# Patient Record
Sex: Male | Born: 1940 | Race: Black or African American | Hispanic: No | Smoking: Former smoker
Health system: Southern US, Community
[De-identification: ages and names within clinical notes are randomized; demographics above are authoritative.]

## PROBLEM LIST (undated history)

## (undated) DIAGNOSIS — C61 Malignant neoplasm of prostate: Secondary | ICD-10-CM

## (undated) DIAGNOSIS — Z8042 Family history of malignant neoplasm of prostate: Secondary | ICD-10-CM

## (undated) DIAGNOSIS — E119 Type 2 diabetes mellitus without complications: Secondary | ICD-10-CM

## (undated) DIAGNOSIS — Z8041 Family history of malignant neoplasm of ovary: Secondary | ICD-10-CM

## (undated) DIAGNOSIS — C801 Malignant (primary) neoplasm, unspecified: Secondary | ICD-10-CM

## (undated) DIAGNOSIS — J449 Chronic obstructive pulmonary disease, unspecified: Secondary | ICD-10-CM

## (undated) DIAGNOSIS — I10 Essential (primary) hypertension: Secondary | ICD-10-CM

## (undated) HISTORY — PX: GOLD SEED IMPLANT: SHX6343

## (undated) HISTORY — DX: Family history of malignant neoplasm of prostate: Z80.42

## (undated) HISTORY — DX: Chronic obstructive pulmonary disease, unspecified: J44.9

## (undated) HISTORY — DX: Family history of malignant neoplasm of ovary: Z80.41

---

## 2004-07-03 DIAGNOSIS — R079 Chest pain, unspecified: Secondary | ICD-10-CM | POA: Insufficient documentation

## 2007-05-16 DIAGNOSIS — Z8679 Personal history of other diseases of the circulatory system: Secondary | ICD-10-CM | POA: Insufficient documentation

## 2007-05-16 DIAGNOSIS — Z720 Tobacco use: Secondary | ICD-10-CM | POA: Insufficient documentation

## 2007-10-05 DIAGNOSIS — J42 Unspecified chronic bronchitis: Secondary | ICD-10-CM | POA: Insufficient documentation

## 2007-10-11 DIAGNOSIS — F528 Other sexual dysfunction not due to a substance or known physiological condition: Secondary | ICD-10-CM | POA: Insufficient documentation

## 2007-12-17 DIAGNOSIS — M25519 Pain in unspecified shoulder: Secondary | ICD-10-CM | POA: Insufficient documentation

## 2015-07-03 ENCOUNTER — Encounter (HOSPITAL_COMMUNITY): Payer: Self-pay

## 2015-07-03 ENCOUNTER — Emergency Department (HOSPITAL_COMMUNITY): Payer: Medicare PPO

## 2015-07-03 ENCOUNTER — Emergency Department (HOSPITAL_COMMUNITY)
Admission: EM | Admit: 2015-07-03 | Discharge: 2015-07-03 | Disposition: A | Discharge status: Home / Self Care | Payer: Medicare PPO | Attending: Emergency Medicine | Admitting: Emergency Medicine

## 2015-07-03 DIAGNOSIS — Z7952 Long term (current) use of systemic steroids: Secondary | ICD-10-CM | POA: Insufficient documentation

## 2015-07-03 DIAGNOSIS — Z7982 Long term (current) use of aspirin: Secondary | ICD-10-CM | POA: Insufficient documentation

## 2015-07-03 DIAGNOSIS — Z79899 Other long term (current) drug therapy: Secondary | ICD-10-CM | POA: Diagnosis not present

## 2015-07-03 DIAGNOSIS — I10 Essential (primary) hypertension: Secondary | ICD-10-CM | POA: Diagnosis not present

## 2015-07-03 DIAGNOSIS — F17210 Nicotine dependence, cigarettes, uncomplicated: Secondary | ICD-10-CM | POA: Insufficient documentation

## 2015-07-03 DIAGNOSIS — Z8546 Personal history of malignant neoplasm of prostate: Secondary | ICD-10-CM | POA: Insufficient documentation

## 2015-07-03 DIAGNOSIS — R590 Localized enlarged lymph nodes: Secondary | ICD-10-CM | POA: Insufficient documentation

## 2015-07-03 DIAGNOSIS — R7989 Other specified abnormal findings of blood chemistry: Secondary | ICD-10-CM | POA: Diagnosis present

## 2015-07-03 HISTORY — DX: Malignant (primary) neoplasm, unspecified: C80.1

## 2015-07-03 HISTORY — DX: Essential (primary) hypertension: I10

## 2015-07-03 LAB — CBC WITH DIFFERENTIAL/PLATELET
BASOS ABS: 0 10*3/uL (ref 0.0–0.1)
Basophils Relative: 1 %
EOS PCT: 1 %
Eosinophils Absolute: 0.1 10*3/uL (ref 0.0–0.7)
HCT: 40.1 % (ref 39.0–52.0)
Hemoglobin: 12.9 g/dL — ABNORMAL LOW (ref 13.0–17.0)
LYMPHS PCT: 15 %
Lymphs Abs: 1.3 10*3/uL (ref 0.7–4.0)
MCH: 28.4 pg (ref 26.0–34.0)
MCHC: 32.2 g/dL (ref 30.0–36.0)
MCV: 88.3 fL (ref 78.0–100.0)
MONO ABS: 1.1 10*3/uL — AB (ref 0.1–1.0)
Monocytes Relative: 13 %
Neutro Abs: 6.1 10*3/uL (ref 1.7–7.7)
Neutrophils Relative %: 70 %
PLATELETS: 319 10*3/uL (ref 150–400)
RBC: 4.54 MIL/uL (ref 4.22–5.81)
RDW: 13.8 % (ref 11.5–15.5)
WBC: 8.6 10*3/uL (ref 4.0–10.5)

## 2015-07-03 LAB — BASIC METABOLIC PANEL
ANION GAP: 6 (ref 5–15)
BUN: 14 mg/dL (ref 6–20)
CALCIUM: 9.9 mg/dL (ref 8.9–10.3)
CO2: 31 mmol/L (ref 22–32)
Chloride: 105 mmol/L (ref 101–111)
Creatinine, Ser: 0.94 mg/dL (ref 0.61–1.24)
GFR calc Af Amer: >60 mL/min (ref >60)
GLUCOSE: 74 mg/dL (ref 65–99)
Potassium: 3.9 mmol/L (ref 3.5–5.1)
Sodium: 142 mmol/L (ref 135–145)

## 2015-07-03 MED ORDER — IOHEXOL 350 MG/ML SOLN
100.0000 mL | Freq: Once | INTRAVENOUS | Status: AC | PRN
  Administered 2015-07-03: 100 mL via INTRAVENOUS

## 2015-07-03 NOTE — ED Notes (Signed)
MD at bedside. 

## 2015-07-03 NOTE — Progress Notes (Signed)
EDCM spoke to patient at bedside.  Patient confirms his pcp is Dr. Garnette Scheuermann in [place].

## 2015-07-03 NOTE — ED Notes (Signed)
Bed: WA23 Expected date:  Expected time:  Means of arrival:  Comments: Triage 5 

## 2015-07-03 NOTE — Discharge Instructions (Signed)

## 2015-07-03 NOTE — ED Provider Notes (Signed)
CSN: [id number]     Arrival date & time 07/03/15  1752 History   First MD Initiated Contact with Patient 07/03/15 1847     Chief Complaint  Patient presents with  . CA Pt   . Abnormal Lab  . Cough    HPI Pt has been having trouble with a cough for several weeks associated with sinus congestion.  No fevers. He denies any chest pain.  He has noticed some shortness of breath when he overdoes it but he is generally not feeling short of breath.  He went to an UC yesterday and was started on doxycycline.  He had some lab tests yesterday that included a d dimer and he was told to come to the ED because that was elevated. Past Medical History  Diagnosis Date  . Cancer [facility])     prostate  . Hypertension    Past Surgical History  Procedure Laterality Date  . Gold seed implant     History reviewed. No pertinent family history. Social History  Substance Use Topics  . Smoking status: Current Every Day Smoker    Types: Cigarettes  . Smokeless tobacco: None  . Alcohol Use: Yes    Review of Systems  All other systems reviewed and are negative.     Allergies  Review of patient's allergies indicates no known allergies.  Home Medications   Prior to Admission medications   Medication Sig Start Date End Date Taking? Authorizing Provider  acetaminophen (TYLENOL) 500 MG tablet Take 500 mg by mouth every 6 (six) hours as needed for moderate pain.   Yes Historical Provider, MD  amLODipine (NORVASC) 10 MG tablet Take 10 mg by mouth daily. 06/07/15  Yes Historical Provider, MD  aspirin EC 81 MG tablet Take 81 mg by mouth daily.   Yes Historical Provider, MD  brimonidine (ALPHAGAN) 0.2 % ophthalmic solution Place 1 drop into both eyes 3 (three) times daily. 05/03/15  Yes Historical Provider, MD  CALCIUM PO Take 1 tablet by mouth daily.   Yes Historical Provider, MD  Cholecalciferol (VITAMIN D-3 PO) Take 1 tablet by mouth daily.   Yes Historical Provider, MD  dorzolamide (TRUSOPT) 2 % ophthalmic  solution Place 1 drop into both eyes 3 (three) times daily. 06/04/15  Yes Historical Provider, MD  doxycycline (VIBRAMYCIN) 100 MG capsule Take 1 capsule by mouth 2 (two) times daily. Started 07/02/2015 06/04/15  Yes Historical Provider, MD  ibuprofen (ADVIL,MOTRIN) 200 MG tablet Take 200 mg by mouth every 6 (six) hours as needed for moderate pain.   Yes Historical Provider, MD  latanoprost (XALATAN) 0.005 % ophthalmic solution Place 1 drop into both eyes at bedtime. 05/16/15  Yes Historical Provider, MD  losartan (COZAAR) 100 MG tablet Take 100 mg by mouth daily. 06/07/15  Yes Historical Provider, MD  prednisoLONE acetate (PRED FORTE) 1 % ophthalmic suspension Place 1 drop into both eyes daily.   Yes Historical Provider, MD  SPIRIVA RESPIMAT 2.5 MCG/ACT AERS Inhale 1 puff into the lungs daily. 04/30/15  Yes Historical Provider, MD   BP 146/78 mmHg  Pulse 53  Temp(Src) 98.6 F (37 C) (Oral)  Resp 14  SpO2 100% Physical Exam  Constitutional: No distress.  HENT:  Head: Normocephalic and atraumatic.  Right Ear: External ear normal.  Left Ear: External ear normal.  Eyes: Conjunctivae are normal. Right eye exhibits no discharge. Left eye exhibits no discharge. No scleral icterus.  Neck: Neck supple. No tracheal deviation present.  Cardiovascular: Normal rate, regular rhythm and  intact distal pulses.   Pulmonary/Chest: Effort normal and breath sounds normal. No stridor. No respiratory distress. He has no wheezes. He has no rales.  Abdominal: Soft. Bowel sounds are normal. He exhibits no distension. There is no tenderness. There is no rebound and no guarding.  Musculoskeletal: He exhibits no edema or tenderness.  Lymphadenopathy:       Left: Supraclavicular adenopathy present.  Neurological: He is alert. He has normal strength. No cranial nerve deficit (no facial droop, extraocular movements intact, no slurred speech) or sensory deficit. He exhibits normal muscle tone. He displays no seizure activity.  Coordination normal.  Skin: Skin is warm and dry. No rash noted.  Psychiatric: He has a normal mood and affect.  Nursing note and vitals reviewed.   ED Course  Procedures (including critical care time) Labs Review Labs Reviewed  CBC WITH DIFFERENTIAL/PLATELET - Abnormal; Notable for the following:    Hemoglobin 12.9 (*)    Monocytes Absolute 1.1 (*)    All other components within normal limits  BASIC METABOLIC PANEL    Imaging Review Ct Angio Chest Pe W/cm &/or Wo Cm  07/03/2015  CLINICAL DATA:  Cough for 1 month.  Elevated D-dimer. EXAM: CT ANGIOGRAPHY CHEST WITH CONTRAST TECHNIQUE: Multidetector CT imaging of the chest was performed using the standard protocol during bolus administration of intravenous contrast. Multiplanar CT image reconstructions and MIPs were obtained to evaluate the vascular anatomy. CONTRAST:  199mL OMNIPAQUE IOHEXOL 350 MG/ML SOLN COMPARISON:  None. FINDINGS: Mediastinum/Lymph Nodes: No pulmonary emboli or thoracic aortic dissection identified. Pathologic lymph node enlargement in the anterior mediastinum, also the LEFT supraclavicular region. Conglomerate nodal mass on image 34 series 4 overlying the transverse arch measures 23 x 40 mm cross-section. No hilar lymph nodes are seen. Differential includes lymphoma versus metastatic disease. Incidental fluid in the pericardial recess on image 50 series 4. Lungs/Pleura: No pulmonary mass, infiltrate, or effusion. Upper abdomen: No acute findings. No visible retrocrural or upper abdominal lymph nodes. Musculoskeletal: No chest wall mass or suspicious bone lesions identified. Review of the MIP images confirms the above findings. IMPRESSION: Anterior mediastinal and LEFT supraclavicular pathologic adenopathy. Differential includes lymphoma versus metastatic disease. No lung mass is seen. No evidence for pulmonary emboli. Electronically Signed   By: Staci Righter M.D.   On: 07/03/2015 21:28   I have personally reviewed and  evaluated these images and lab results as part of my medical decision-making.   MDM   Final diagnoses:  Mediastinal adenopathy  Supraclavicular adenopathy   The patient's laboratory tests are unremarkable. His CT scan of the chest does not show any evidence of pulmonary embolism however he does have abnormal lymphadenopathy in the anterior mediastinum in the left supraclavicular region. I can palpate the left supraclavicular lymph nodes. Patient does have a history of prostate cancer. I explained the findings to the patient and his daughter. They understand that this could be related to either lymph node cancer or possibly a metastatic cancer.He is scheduled to see his oncologist on Monday to review the results of a recent bone scan. They will discuss the results of this test with him on Monday.     Dorie Rank, MD 07/03/15 2223

## 2015-07-03 NOTE — ED Notes (Signed)
Pt c/o cough x 1 month and elevated D-dimer at Urgent Care yesterday.  Denies pain.  Pt reports "cough due to sinuses."  Pt was seen at Urgent Care yesterday for same and started on doxycycline.  Also, Urgent care ran a D-Dimer and called w/ results today.

## 2016-03-19 ENCOUNTER — Encounter: Payer: Self-pay | Admitting: Hematology

## 2016-04-01 ENCOUNTER — Encounter (INDEPENDENT_AMBULATORY_CARE_PROVIDER_SITE_OTHER): Payer: Medicare PPO | Admitting: Ophthalmology

## 2016-04-01 DIAGNOSIS — H401132 Primary open-angle glaucoma, bilateral, moderate stage: Secondary | ICD-10-CM | POA: Diagnosis not present

## 2016-04-01 DIAGNOSIS — H26491 Other secondary cataract, right eye: Secondary | ICD-10-CM | POA: Diagnosis not present

## 2016-04-01 DIAGNOSIS — H43813 Vitreous degeneration, bilateral: Secondary | ICD-10-CM

## 2016-04-01 DIAGNOSIS — I10 Essential (primary) hypertension: Secondary | ICD-10-CM | POA: Diagnosis not present

## 2016-04-01 DIAGNOSIS — H35033 Hypertensive retinopathy, bilateral: Secondary | ICD-10-CM | POA: Diagnosis not present

## 2016-04-20 DIAGNOSIS — I10 Essential (primary) hypertension: Secondary | ICD-10-CM | POA: Insufficient documentation

## 2016-05-21 ENCOUNTER — Encounter: Payer: Self-pay | Admitting: Hematology

## 2017-08-27 ENCOUNTER — Encounter (HOSPITAL_COMMUNITY): Payer: Self-pay | Admitting: Hematology

## 2017-08-27 ENCOUNTER — Inpatient Hospital Stay (HOSPITAL_COMMUNITY): Payer: Medicare PPO | Attending: Hematology | Admitting: Hematology

## 2017-08-27 ENCOUNTER — Ambulatory Visit (HOSPITAL_COMMUNITY): Payer: Medicare PPO | Admitting: Hematology

## 2017-08-27 ENCOUNTER — Other Ambulatory Visit: Payer: Self-pay

## 2017-08-27 ENCOUNTER — Inpatient Hospital Stay (HOSPITAL_COMMUNITY): Payer: Medicare PPO

## 2017-08-27 DIAGNOSIS — E8770 Fluid overload, unspecified: Secondary | ICD-10-CM

## 2017-08-27 DIAGNOSIS — C61 Malignant neoplasm of prostate: Secondary | ICD-10-CM | POA: Diagnosis not present

## 2017-08-27 DIAGNOSIS — Z720 Tobacco use: Secondary | ICD-10-CM

## 2017-08-27 DIAGNOSIS — Z809 Family history of malignant neoplasm, unspecified: Secondary | ICD-10-CM | POA: Diagnosis not present

## 2017-08-27 DIAGNOSIS — C771 Secondary and unspecified malignant neoplasm of intrathoracic lymph nodes: Principal | ICD-10-CM

## 2017-08-27 DIAGNOSIS — E291 Testicular hypofunction: Secondary | ICD-10-CM

## 2017-08-27 DIAGNOSIS — G620 Drug-induced polyneuropathy: Secondary | ICD-10-CM | POA: Diagnosis not present

## 2017-08-27 LAB — COMPREHENSIVE METABOLIC PANEL
ALBUMIN: 3.7 g/dL (ref 3.5–5.0)
ALT: 10 U/L — ABNORMAL LOW (ref 17–63)
AST: 17 U/L (ref 15–41)
Alkaline Phosphatase: 48 U/L (ref 38–126)
Anion gap: 9 (ref 5–15)
BUN: 16 mg/dL (ref 6–20)
CHLORIDE: 105 mmol/L (ref 101–111)
CO2: 25 mmol/L (ref 22–32)
Calcium: 9.7 mg/dL (ref 8.9–10.3)
Creatinine, Ser: 0.95 mg/dL (ref 0.61–1.24)
GFR calc Af Amer: >60 mL/min (ref >60)
GFR calc non Af Amer: >60 mL/min (ref >60)
Glucose, Bld: 147 mg/dL — ABNORMAL HIGH (ref 65–99)
POTASSIUM: 4 mmol/L (ref 3.5–5.1)
SODIUM: 139 mmol/L (ref 135–145)
Total Bilirubin: 0.8 mg/dL (ref 0.3–1.2)
Total Protein: 6.8 g/dL (ref 6.5–8.1)

## 2017-08-27 LAB — CBC
HEMATOCRIT: 38.3 % — AB (ref 39.0–52.0)
Hemoglobin: 12.1 g/dL — ABNORMAL LOW (ref 13.0–17.0)
MCH: 27.4 pg (ref 26.0–34.0)
MCHC: 31.6 g/dL (ref 30.0–36.0)
MCV: 86.8 fL (ref 78.0–100.0)
Platelets: 305 10*3/uL (ref 150–400)
RBC: 4.41 MIL/uL (ref 4.22–5.81)
RDW: 13.4 % (ref 11.5–15.5)
WBC: 9.4 10*3/uL (ref 4.0–10.5)

## 2017-08-27 LAB — PSA: Prostatic Specific Antigen: 0.38 ng/mL (ref 0.00–4.00)

## 2017-08-27 MED ORDER — HEPARIN SOD (PORK) LOCK FLUSH 100 UNIT/ML IV SOLN
500.0000 [IU] | Freq: Once | INTRAVENOUS | Status: AC
  Administered 2017-08-27: 500 [IU] via INTRAVENOUS

## 2017-08-27 MED ORDER — SODIUM CHLORIDE 0.9% FLUSH
10.0000 mL | Freq: Once | INTRAVENOUS | Status: AC
  Administered 2017-08-27: 10 mL via INTRAVENOUS

## 2017-08-27 NOTE — Assessment & Plan Note (Signed)
1.  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  Last PSA on 07/02/2017 was 0.4, went up from 0.2 prior to that.  We will obtain another PSA level today.  We will continue abiraterone and prednisone at this time.  It is possible that he could be developing resistance to the sciatica.  We will also consider checking BRCA1/2 status to see if he will be a candidate for PARP inhibitor therapy.  His next Lupron injection is on May 31 at Dr. Wilhemina Bonito office.  He will come back in 2 months for follow-up.  2.  Androgen deprivation therapy induced bone loss: We have started him on zoledronic acid once every 3 months for a year.  His third dose was on 07/02/2017.  He will receive his fourth dose at next visit in 2 months.  He will continue calcium supplements.  3.  Fluid overload: He is continuing Bumex 1 mg daily as needed.  4.  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  Fingertips are numb at times.  His feet feel cold all the time.  He is able to perform all his activities.

## 2017-08-27 NOTE — Patient Instructions (Signed)
[place] at [facility]  Discharge Instructions:  Your port was flushed with labs drawn today.   _______________________________________________________________  Thank you for choosing [place] at [facility] to provide your oncology and hematology care.  To afford each patient quality time with our providers, please arrive at least 15 minutes before your scheduled appointment.  You need to re-schedule your appointment if you arrive 10 or more minutes late.  We strive to give you quality time with our providers, and arriving late affects you and other patients whose appointments are after yours.  Also, if you no show three or more times for appointments you may be dismissed from the clinic.  Again, thank you for choosing [place] [facility] [place] hope is that these requests will allow you access to exceptional care and in a timely manner. _______________________________________________________________  If you have questions after your visit, please contact our office at [phone] between the hours of 8:30 a.m. and 5:00 p.m. Voicemails left after 4:30 p.m. will not be returned until the following business day. _______________________________________________________________  For prescription refill requests, have your pharmacy contact our office. _______________________________________________________________  Recommendations made by the consultant and any test results will be sent to your referring physician. _______________________________________________________________

## 2017-08-27 NOTE — Progress Notes (Signed)
[facility] NOTE   Patient Care Team: Joseph Art, MD as PCP - General (Internal Medicine)  CHIEF COMPLAINTS/PURPOSE OF CONSULTATION:  Metastatic prostate cancer.  HISTORY OF PRESENTING ILLNESS:  Andre [name] 77 y.o. male is seen today for further management of metastatic prostate cancer to the lymph nodes.  He was initially diagnosed in March 2017, received chemotherapy with docetaxel for 14 cycles.  It was changed to Abiraterone secondary to lower extremity swelling.  He is tolerating Abiraterone and prednisone very well.  He is continuing to use bumetanide 1 mg as needed for his lower extremity swelling.  He is able to perform all his activities at home without any problems.  Today he is seen with his daughter.  He denies any new onset pains.  No fevers, night sweats or weight loss was reported.  He was reportedly started on Lipitor by Dr. Joaquim Lai.  He denies any headaches or vision changes.  His blood pressure at home is well controlled.  MEDICAL HISTORY:  Past Medical History:  Diagnosis Date  . Cancer [facility])    prostate  . COPD (chronic obstructive pulmonary disease) ([place])   . Hypertension     SURGICAL HISTORY: Past Surgical History:  Procedure Laterality Date  . GOLD SEED IMPLANT      SOCIAL HISTORY: Social History   Socioeconomic History  . Marital status: Widowed    Spouse name: Not on file  . Number of children: Not on file  . Years of education: Not on file  . Highest education level: Not on file  Occupational History  . Not on file  Social Needs  . Financial resource strain: Not on file  . Food insecurity:    Worry: Not on file    Inability: Not on file  . Transportation needs:    Medical: Not on file    Non-medical: Not on file  Tobacco Use  . Smoking status: Current Every Day Smoker    Types: Cigarettes  . Smokeless tobacco: Never Used  Substance and Sexual Activity  . Alcohol use: Not Currently  . Drug use: No  . Sexual  activity: Not on file  Lifestyle  . Physical activity:    Days per week: Not on file    Minutes per session: Not on file  . Stress: Not on file  Relationships  . Social connections:    Talks on phone: Not on file    Gets together: Not on file    Attends religious service: Not on file    Active member of club or organization: Not on file    Attends meetings of clubs or organizations: Not on file    Relationship status: Not on file  . Intimate partner violence:    Fear of current or ex partner: Not on file    Emotionally abused: Not on file    Physically abused: Not on file    Forced sexual activity: Not on file  Other Topics Concern  . Not on file  Social History Narrative  . Not on file    FAMILY HISTORY: Family History  Problem Relation Age of Onset  . Cancer Mother   . Heart attack Father     ALLERGIES:  has No Known Allergies.  MEDICATIONS:  Current Outpatient Medications  Medication Sig Dispense Refill  . abiraterone acetate (ZYTIGA) 500 MG tablet Take 1,000 mg by mouth daily. Take on an empty stomach 1 hour before or 2 hours after a meal.    .  acetaminophen (TYLENOL) 500 MG tablet Take 500 mg by mouth every 6 (six) hours as needed for moderate pain.    Marland Kitchen aspirin EC 81 MG tablet Take 81 mg by mouth daily.    Marland Kitchen atorvastatin (LIPITOR) 10 MG tablet Take 10 mg by mouth at bedtime.    . brimonidine (ALPHAGAN) 0.2 % ophthalmic solution Place 1 drop into both eyes 3 (three) times daily.    . bumetanide (BUMEX) 1 MG tablet Take 1 mg by mouth daily.    Marland Kitchen CALCIUM PO Take 600 mg by mouth daily.     . cetirizine (ZYRTEC) 10 MG tablet Take 10 mg by mouth daily.    . Cholecalciferol (VITAMIN D-3 PO) Take 1 tablet by mouth daily.    Marland Kitchen dicyclomine (BENTYL) 10 MG capsule Take 10 mg by mouth daily.    . dorzolamide (TRUSOPT) 2 % ophthalmic solution Place 1 drop into both eyes 3 (three) times daily.    Marland Kitchen ibuprofen (ADVIL,MOTRIN) 200 MG tablet Take 200 mg by mouth every 6 (six) hours  as needed for moderate pain.    Marland Kitchen latanoprost (XALATAN) 0.005 % ophthalmic solution Place 1 drop into both eyes at bedtime.    Marland Kitchen losartan (COZAAR) 100 MG tablet Take 100 mg by mouth daily.    . magnesium 30 MG tablet Take 400 mg by mouth daily.    . potassium chloride SA (K-DUR,KLOR-CON) 20 MEQ tablet Take 40 mEq by mouth daily.    . predniSONE (DELTASONE) 5 MG tablet Take 5 mg by mouth daily with breakfast.    . prochlorperazine (COMPAZINE) 10 MG tablet Take 10 mg by mouth every 6 (six) hours as needed for nausea or vomiting.    . umeclidinium-vilanterol (ANORO ELLIPTA) 62.5-25 MCG/INH AEPB Inhale 1 puff into the lungs daily.     No current facility-administered medications for this visit.     REVIEW OF SYSTEMS:   Constitutional: Denies fevers, chills or abnormal night sweats Eyes: Denies blurriness of vision, double vision or watery eyes Ears, nose, mouth, throat, and face: Denies mucositis or sore throat Respiratory: Denies cough, dyspnea or wheezes Cardiovascular: Denies palpitation, chest discomfort or lower extremity swelling Gastrointestinal:  Denies nausea, heartburn or change in bowel habits Skin: Denies abnormal skin rashes Lymphatics: Denies new lymphadenopathy or easy bruising Neurological: He has some numbness in the fingertips.  His feet feel cold. Behavioral/Psych: Mood is stable, no new changes  All other systems were reviewed with the patient and are negative.  PHYSICAL EXAMINATION: ECOG PERFORMANCE STATUS: 1 - Symptomatic but completely ambulatory I have reviewed his vital signs today. GENERAL:alert, no distress and comfortable SKIN: skin color, texture, turgor are normal, no rashes or significant lesions EYES: normal, conjunctiva are pink and non-injected, sclera clear OROPHARYNX:no exudate, no erythema and lips, buccal mucosa, and tongue normal  NECK: supple, thyroid normal size, non-tender, without nodularity LYMPH:  no palpable lymphadenopathy in the cervical,  axillary or inguinal LUNGS: clear to auscultation and percussion with normal breathing effort HEART: regular rate & rhythm and no murmurs and no lower extremity edema ABDOMEN:abdomen soft, non-tender and normal bowel sounds Musculoskeletal:no cyanosis of digits and no clubbing  PSYCH: alert & oriented x 3 with fluent speech NEURO: no focal motor/sensory deficits  LABORATORY DATA:  I have reviewed the data as listed Lab Results  Component Value Date   WBC 8.6 07/03/2015   HGB 12.9 (L) 07/03/2015   HCT 40.1 07/03/2015   MCV 88.3 07/03/2015   PLT 319 07/03/2015  Chemistry      Component Value Date/Time   NA 142 07/03/2015 1953   K 3.9 07/03/2015 1953   CL 105 07/03/2015 1953   CO2 31 07/03/2015 1953   BUN 14 07/03/2015 1953   CREATININE 0.94 07/03/2015 1953      Component Value Date/Time   CALCIUM 9.9 07/03/2015 1953       RADIOGRAPHIC STUDIES: I have reviewed his previous CT scans from December 2017 which did not show any evidence of metastatic disease.  ASSESSMENT & PLAN:  Prostate cancer metastatic to intrathoracic lymph node ([place]) 1.  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  Last PSA on 07/02/2017 was 0.4, went up from 0.2 prior to that.  We will obtain another PSA level today.  We will continue abiraterone and prednisone at this time.  It is possible that he could be developing resistance to the sciatica.  We will also consider checking BRCA1/2 status to see if he will be a candidate for PARP inhibitor therapy.  His next Lupron injection is on May 31 at Dr. Wilhemina Bonito office.  He will come back in 2 months for follow-up.  2.  Androgen deprivation therapy induced bone loss: We have started him on zoledronic acid once every 3 months for a year.  His third dose was on 07/02/2017.  He will receive his fourth dose at next visit in 2 months.  He will continue calcium supplements.  3.   Fluid overload: He is continuing Bumex 1 mg daily as needed.  4.  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  Fingertips are numb at times.  His feet feel cold all the time.  He is able to perform all his activities.  Orders Placed This Encounter  Procedures  . CBC  . Comprehensive metabolic panel  . PSA    Standing Status:   Future    Standing Expiration Date:   08/27/2018  . CBC    Standing Status:   Future    Number of Occurrences:   1    Standing Expiration Date:   08/27/2018  . Comprehensive metabolic panel    Standing Status:   Future    Number of Occurrences:   1    Standing Expiration Date:   08/27/2018  . PSA    Standing Status:   Future    Number of Occurrences:   1    Standing Expiration Date:   08/27/2018        Derek Jack, MD 08/27/2017 3:23 PM

## 2017-08-27 NOTE — Progress Notes (Signed)
Port flushed with lab draw per orders.  Port site clean and dry with no bruising or swelling noted at site.  Good blood return with no complaints of pain with flush.  Band aid applied.  VSS with discharge and left ambulatory with family.  No s/s of distress noted.

## 2017-08-31 ENCOUNTER — Encounter (HOSPITAL_COMMUNITY): Payer: Self-pay

## 2017-09-29 ENCOUNTER — Telehealth (HOSPITAL_COMMUNITY): Payer: Self-pay

## 2017-09-29 DIAGNOSIS — C61 Malignant neoplasm of prostate: Secondary | ICD-10-CM

## 2017-09-29 DIAGNOSIS — C771 Secondary and unspecified malignant neoplasm of intrathoracic lymph nodes: Principal | ICD-10-CM

## 2017-09-29 MED ORDER — BUMETANIDE 1 MG PO TABS: 1.0000 mg | ORAL_TABLET | Freq: Every day | ORAL | 0 refills | Status: DC

## 2017-09-29 NOTE — Telephone Encounter (Signed)
-----   Message from Holley Bouche, NP sent at 09/29/2017  4:16 PM EDT ----- Regarding: RE: refill We don't provide this patient with Bumex; he should contact his PCP or whoever initially wrote the prescription for him.   gwd ----- Message ----- From: Louis Meckel Sent: 09/29/2017   4:08 PM To: Holley Bouche, NP, Onc Nurse Ap Subject: refill                                         Patient needs refill on Bumex. Patient uses [facility].

## 2017-09-29 NOTE — Telephone Encounter (Signed)
Reviewed with Dr. Delton Coombes due to him being the provider who originally wrote the prescription per the daughter.  Ok to refill verbal order Dr. Delton Coombes.

## 2017-10-13 ENCOUNTER — Other Ambulatory Visit (HOSPITAL_COMMUNITY): Payer: Self-pay | Admitting: Emergency Medicine

## 2017-10-13 MED ORDER — POTASSIUM CHLORIDE CRYS ER 20 MEQ PO TBCR: 40.0000 meq | EXTENDED_RELEASE_TABLET | Freq: Every day | ORAL | 0 refills | Status: DC

## 2017-10-15 ENCOUNTER — Other Ambulatory Visit (HOSPITAL_COMMUNITY): Payer: Self-pay

## 2017-10-15 DIAGNOSIS — C771 Secondary and unspecified malignant neoplasm of intrathoracic lymph nodes: Principal | ICD-10-CM

## 2017-10-15 DIAGNOSIS — C61 Malignant neoplasm of prostate: Secondary | ICD-10-CM

## 2017-10-15 MED ORDER — PREDNISONE 5 MG PO TABS: 5.0000 mg | ORAL_TABLET | Freq: Every day | ORAL | 1 refills | Status: DC

## 2017-10-15 NOTE — Telephone Encounter (Signed)
Received refill request from patients pharmacy for prednisone. Reviewed with provider,chart checked and refilled.  

## 2017-10-28 ENCOUNTER — Other Ambulatory Visit (HOSPITAL_COMMUNITY): Payer: Self-pay

## 2017-10-28 DIAGNOSIS — C771 Secondary and unspecified malignant neoplasm of intrathoracic lymph nodes: Principal | ICD-10-CM

## 2017-10-28 DIAGNOSIS — C61 Malignant neoplasm of prostate: Secondary | ICD-10-CM

## 2017-10-29 ENCOUNTER — Other Ambulatory Visit: Payer: Self-pay

## 2017-10-29 ENCOUNTER — Inpatient Hospital Stay (HOSPITAL_COMMUNITY): Payer: Medicare PPO

## 2017-10-29 ENCOUNTER — Inpatient Hospital Stay (HOSPITAL_COMMUNITY): Payer: Medicare PPO | Attending: Hematology | Admitting: Hematology

## 2017-10-29 ENCOUNTER — Encounter (HOSPITAL_COMMUNITY): Payer: Self-pay | Admitting: Hematology

## 2017-10-29 VITALS — BP 110/63 | HR 50 | Temp 98.0°F | Resp 18

## 2017-10-29 DIAGNOSIS — G620 Drug-induced polyneuropathy: Secondary | ICD-10-CM | POA: Diagnosis not present

## 2017-10-29 DIAGNOSIS — Z87891 Personal history of nicotine dependence: Secondary | ICD-10-CM | POA: Diagnosis not present

## 2017-10-29 DIAGNOSIS — C61 Malignant neoplasm of prostate: Secondary | ICD-10-CM

## 2017-10-29 DIAGNOSIS — C771 Secondary and unspecified malignant neoplasm of intrathoracic lymph nodes: Principal | ICD-10-CM

## 2017-10-29 DIAGNOSIS — E291 Testicular hypofunction: Secondary | ICD-10-CM | POA: Diagnosis not present

## 2017-10-29 DIAGNOSIS — E8770 Fluid overload, unspecified: Secondary | ICD-10-CM

## 2017-10-29 LAB — COMPREHENSIVE METABOLIC PANEL
ALK PHOS: 59 U/L (ref 38–126)
ALT: 10 U/L — ABNORMAL LOW (ref 17–63)
ANION GAP: 8 (ref 5–15)
AST: 14 U/L — ABNORMAL LOW (ref 15–41)
Albumin: 3.5 g/dL (ref 3.5–5.0)
BUN: 14 mg/dL (ref 6–20)
CALCIUM: 9.6 mg/dL (ref 8.9–10.3)
CO2: 25 mmol/L (ref 22–32)
Chloride: 107 mmol/L (ref 101–111)
Creatinine, Ser: 1 mg/dL (ref 0.61–1.24)
GFR calc Af Amer: >60 mL/min (ref >60)
GFR calc non Af Amer: >60 mL/min (ref >60)
GLUCOSE: 302 mg/dL — AB (ref 65–99)
POTASSIUM: 4.5 mmol/L (ref 3.5–5.1)
SODIUM: 140 mmol/L (ref 135–145)
Total Bilirubin: 0.8 mg/dL (ref 0.3–1.2)
Total Protein: 6.8 g/dL (ref 6.5–8.1)

## 2017-10-29 LAB — CBC WITH DIFFERENTIAL/PLATELET
Basophils Absolute: 0 10*3/uL (ref 0.0–0.1)
Basophils Relative: 0 %
Eosinophils Absolute: 0.2 10*3/uL (ref 0.0–0.7)
Eosinophils Relative: 2 %
HCT: 39 % (ref 39.0–52.0)
HEMOGLOBIN: 12.4 g/dL — AB (ref 13.0–17.0)
Lymphocytes Relative: 12 %
Lymphs Abs: 1.2 10*3/uL (ref 0.7–4.0)
MCH: 27.5 pg (ref 26.0–34.0)
MCHC: 31.8 g/dL (ref 30.0–36.0)
MCV: 86.5 fL (ref 78.0–100.0)
MONOS PCT: 8 %
Monocytes Absolute: 0.8 10*3/uL (ref 0.1–1.0)
NEUTROS PCT: 78 %
Neutro Abs: 8.2 10*3/uL — ABNORMAL HIGH (ref 1.7–7.7)
Platelets: 308 10*3/uL (ref 150–400)
RBC: 4.51 MIL/uL (ref 4.22–5.81)
RDW: 13.5 % (ref 11.5–15.5)
WBC: 10.5 10*3/uL (ref 4.0–10.5)

## 2017-10-29 LAB — PSA: PROSTATIC SPECIFIC ANTIGEN: 0.3 ng/mL (ref 0.00–4.00)

## 2017-10-29 MED ORDER — SODIUM CHLORIDE 0.9 % IV SOLN
INTRAVENOUS | Status: DC
  Administered 2017-10-29: 15:00:00 via INTRAVENOUS

## 2017-10-29 MED ORDER — HEPARIN SOD (PORK) LOCK FLUSH 100 UNIT/ML IV SOLN
500.0000 [IU] | Freq: Once | INTRAVENOUS | Status: AC
  Administered 2017-10-29: 500 [IU] via INTRAVENOUS

## 2017-10-29 MED ORDER — SODIUM CHLORIDE 0.9% FLUSH
10.0000 mL | Freq: Once | INTRAVENOUS | Status: AC
  Administered 2017-10-29: 10 mL via INTRAVENOUS

## 2017-10-29 MED ORDER — ZOLEDRONIC ACID 4 MG/100ML IV SOLN
4.0000 mg | Freq: Once | INTRAVENOUS | Status: AC
  Administered 2017-10-29: 4 mg via INTRAVENOUS
  Filled 2017-10-29: qty 100

## 2017-10-29 MED ORDER — SODIUM CHLORIDE 0.9% FLUSH: 10.0000 mL | Freq: Once | INTRAVENOUS | Status: DC

## 2017-10-29 NOTE — Progress Notes (Signed)
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  CURRENT THERAPY: Zytiga and prednisone.  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   INTERVAL HISTORY:  Andre [name] 77 y.o. 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  He is tolerating Abiraterone thousand milligrams daily and prednisone 5 mg daily very well.  He has some fluid retention in the legs for which he takes Bumex daily.  His numbness in the fingertips and feet has been stable.  He denies any new onset bone pains.  Denies any fevers, night sweats or weight loss.  Denies any recent hospitalizations.  Energy levels are described as 75%.   REVIEW OF SYSTEMS:  Review of Systems  Constitutional: Positive for fatigue.  Cardiovascular: Positive for leg swelling.  Neurological: Positive for numbness.  All other systems reviewed and are negative.    PAST MEDICAL/SURGICAL HISTORY:  Past Medical History:  Diagnosis Date  . Cancer [facility])    prostate  . COPD (chronic obstructive pulmonary disease) ([place])   . Hypertension    Past Surgical History:  Procedure Laterality Date  . GOLD SEED IMPLANT         SOCIAL HISTORY:  Social History   Socioeconomic History  . Marital status: Widowed    Spouse name: Not on file  . Number of children: Not on file  . Years of education: Not on file  . Highest education level: Not on file  Occupational History  . Not on file  Social Needs  . Financial resource strain: Not on file  . Food insecurity:    Worry: Not on file    Inability: Not on file  . Transportation needs:    Medical: Not on file    Non-medical: Not on file  Tobacco Use  . Smoking status: Former Smoker    Types: Cigarettes    Last attempt to quit: 06/25/2014    Years since quitting: 3.3  . Smokeless tobacco: Never Used  . Tobacco comment: smoked since young age, quit in 2016  Substance and Sexual Activity  . Alcohol use: Not Currently  . Drug use: No  . Sexual activity: Not on file  Lifestyle  . Physical activity:    Days per week: Not on file    Minutes per session: Not on file  . Stress: Not on file  Relationships  . Social connections:    Talks on phone: Not on file    Gets together: Not on file    Attends religious service: Not on file    Active member of club or organization: Not on file    Attends meetings of clubs or  organizations: Not on file    Relationship status: Not on file  . Intimate partner violence:    Fear of current or ex partner: Not on file    Emotionally abused: Not on file    Physically abused: Not on file    Forced sexual activity: Not on file  Other Topics Concern  . Not on file  Social History Narrative  . Not on file    FAMILY HISTORY:  Family History  Problem Relation Age of Onset  . Cancer Mother   . Heart attack Father     CURRENT MEDICATIONS:  Outpatient Encounter Medications as of 10/29/2017  Medication Sig  . abiraterone acetate (ZYTIGA) 500 MG tablet Take 1,000 mg by mouth daily. Take on an empty stomach 1 hour before or 2 hours after a meal.  . acetaminophen (TYLENOL) 500 MG tablet Take 500 mg by mouth every 6 (six)  hours as needed for moderate pain.  Marland Kitchen amLODipine (NORVASC) 2.5 MG tablet   . aspirin EC 81 MG tablet Take 81 mg by mouth daily.  Marland Kitchen atorvastatin (LIPITOR) 10 MG tablet Take 10 mg by mouth at bedtime.  . benzonatate (TESSALON PERLES) 100 MG capsule Tessalon Perles 100 mg capsule  Take 1 capsule 3 times a day by oral route as needed.  . brimonidine (ALPHAGAN) 0.2 % ophthalmic solution Place 1 drop into both eyes 3 (three) times daily.  . bumetanide (BUMEX) 1 MG tablet Take 1 tablet (1 mg total) by mouth daily.  Marland Kitchen CALCIUM PO Take 600 mg by mouth daily.   . cetirizine (ZYRTEC) 10 MG tablet Take 10 mg by mouth daily.  Marland Kitchen CHERATUSSIN AC 100-10 MG/5ML syrup   . Cholecalciferol (VITAMIN D-3 PO) Take 1 tablet by mouth daily.  . cyclobenzaprine (FLEXERIL) 10 MG tablet Take 10 mg by mouth 2 (two) times daily.  Marland Kitchen dicyclomine (BENTYL) 10 MG capsule Take 10 mg by mouth daily.  . DOCEtaxel (TAXOTERE IV) Taxotere  . dorzolamide (TRUSOPT) 2 % ophthalmic solution Place 1 drop into both eyes 3 (three) times daily.  Marland Kitchen doxycycline (VIBRAMYCIN) 100 MG capsule doxycycline hyclate 100 mg capsule  Take 1 capsule twice a day by oral route.  do not take zinc, iron, calcium, magnesium or vitamins while on doxycycline  . ibuprofen (ADVIL,MOTRIN) 200 MG tablet Take 200 mg by mouth every 6 (six) hours as needed for moderate pain.  Marland Kitchen ibuprofen (ADVIL,MOTRIN) 600 MG tablet Take 600 mg by mouth every 6 (six) hours as needed.  . latanoprost (XALATAN) 0.005 % ophthalmic solution Place 1 drop into both eyes at bedtime.  Marland Kitchen losartan (COZAAR) 100 MG tablet Take 100 mg by mouth daily.  . magnesium 30 MG tablet Take 400 mg by mouth daily.  . magnesium oxide (MAG-OX) 400 MG tablet   . nilutamide (NILANDRON) 150 MG tablet   . potassium chloride SA (K-DUR,KLOR-CON) 20 MEQ tablet Take 2 tablets (40 mEq total) by mouth daily.  . predniSONE (DELTASONE) 5 MG tablet Take 1 tablet (5 mg total) by mouth daily with breakfast.  .  prochlorperazine (COMPAZINE) 10 MG tablet Take 10 mg by mouth every 6 (six) hours as needed for nausea or vomiting.  . Tiotropium Bromide Monohydrate (SPIRIVA RESPIMAT) 1.25 MCG/ACT AERS   . umeclidinium-vilanterol (ANORO ELLIPTA) 62.5-25 MCG/INH AEPB Inhale 1 puff into the lungs daily.   No facility-administered encounter medications on file as of 10/29/2017.     ALLERGIES:  No Known Allergies   PHYSICAL EXAM:  ECOG Performance status: 1  Vitals:   10/29/17 1414  BP: (!) 145/59  Pulse: (!) 43  Resp: 18  Temp: 98 F (36.7 C)  SpO2: 98%   Filed Weights   10/29/17 1414  Weight: 232 lb (105.2 kg)    Physical Exam HEENT: No thrush or mucositis. Chest: Bilaterally clear to auscultation. Lymphadenopathy: No palpable neck, supraclavicular or axillary adenopathy. Cardiovascular: S1-S2 regular rate and rhythm. Extremities: 1+ edema bilaterally.  LABORATORY DATA:  I have reviewed the labs as listed.  CBC    Component Value Date/Time   WBC 10.5 10/29/2017 1240   RBC 4.51 10/29/2017 1240   HGB 12.4 (L) 10/29/2017 1240   HCT 39.0 10/29/2017 1240   PLT 308 10/29/2017 1240   MCV 86.5 10/29/2017 1240   MCH 27.5 10/29/2017 1240   MCHC 31.8 10/29/2017 1240   RDW 13.5 10/29/2017 1240   LYMPHSABS 1.2 10/29/2017 1240   MONOABS 0.8 10/29/2017 1240   EOSABS 0.2 10/29/2017 1240   BASOSABS 0.0 10/29/2017 1240   CMP Latest Ref Rng & Units 10/29/2017 08/27/2017 07/03/2015  Glucose 65 - 99 mg/dL 302(H) 147(H) 74  BUN 6 - 20 mg/dL 14 16 14   Creatinine 0.61 - 1.24 mg/dL 1.00 0.95 0.94  Sodium 135 - 145 mmol/L 140 139 142  Potassium 3.5 - 5.1 mmol/L 4.5 4.0 3.9  Chloride 101 - 111 mmol/L 107 105 105  CO2 22 - 32 mmol/L 25 25 31   Calcium 8.9 - 10.3 mg/dL 9.6 9.7 9.9  Total Protein 6.5 - 8.1 g/dL 6.8 6.8 -  Total Bilirubin 0.3 - 1.2 mg/dL 0.8 0.8 -  Alkaline Phos 38 - 126 U/L 59 48 -  AST 15 - 41 U/L 14(L) 17 -  ALT 17 - 63 U/L 10(L) 10(L) -      ASSESSMENT & PLAN:   Prostate cancer  metastatic to intrathoracic lymph node ([place]) 1.  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  Last PSA on 07/02/2017 was 0.4, went up from 0.2 prior to that.  I have rechecked PSA level on 08/27/2017.  It was 0.38.  His last Lupron injection was on 10/22/2017 at Dr. Wilhemina Bonito office.  We will also consider checking BRCA1/2 status to see if he will be a candidate for PARP inhibitor therapy.  He will come back in 3 months for follow-up with repeat PSA.  2.  Androgen deprivation therapy induced bone loss: We have started him on zoledronic acid once every 3 months for a year.  His third dose was on 07/02/2017.  He will proceed with his final dose today.  We plan to check his bone density in the future.  He will continue calcium and vitamin D supplements.  3.  Fluid overload: He is continuing Bumex 1 mg daily as needed.  4.  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  Fingertips are numb at times.  His feet feel cold all the time.  He is able to perform all his activities.      Orders placed this encounter:  No orders of the defined types were placed in this encounter.     Derek Jack, MD [place] [phone]

## 2017-10-29 NOTE — Patient Instructions (Signed)
[facility] at [facility]  Discharge Instructions:  You received zometa today.  _______________________________________________________________  Thank you for choosing [facility] at [facility] to provide your oncology and hematology care.  To afford each patient quality time with our providers, please arrive at least 15 minutes before your scheduled appointment.  You need to re-schedule your appointment if you arrive 10 or more minutes late.  We strive to give you quality time with our providers, and arriving late affects you and other patients whose appointments are after yours.  Also, if you no show three or more times for appointments you may be dismissed from the clinic.  Again, thank you for choosing [facility] at [facility]. Our hope is that these requests will allow you access to exceptional care and in a timely manner. _______________________________________________________________  If you have questions after your visit, please contact our office at [phone] between the hours of 8:30 a.m. and 5:00 p.m. Voicemails left after 4:30 p.m. will not be returned until the following business day. _______________________________________________________________  For prescription refill requests, have your pharmacy contact our office. _______________________________________________________________  Recommendations made by the consultant and any test results will be sent to your referring physician. _______________________________________________________________ 

## 2017-10-29 NOTE — Assessment & Plan Note (Signed)
1.  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  Last PSA on 07/02/2017 was 0.4, went up from 0.2 prior to that.  I have rechecked PSA level on 08/27/2017.  It was 0.38.  His last Lupron injection was on 10/22/2017 at Dr. Wilhemina Bonito office.  We will also consider checking BRCA1/2 status to see if he will be a candidate for PARP inhibitor therapy.  He will come back in 3 months for follow-up with repeat PSA.  2.  Androgen deprivation therapy induced bone loss: We have started him on zoledronic acid once every 3 months for a year.  His third dose was on 07/02/2017.  He will proceed with his final dose today.  We plan to check his bone density in the future.  He will continue calcium and vitamin D supplements.  3.  Fluid overload: He is continuing Bumex 1 mg daily as needed.  4.  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  Fingertips are numb at times.  His feet feel cold all the time.  He is able to perform all his activities.

## 2017-11-11 ENCOUNTER — Other Ambulatory Visit (HOSPITAL_COMMUNITY): Payer: Self-pay | Admitting: Hematology

## 2017-11-11 DIAGNOSIS — C771 Secondary and unspecified malignant neoplasm of intrathoracic lymph nodes: Principal | ICD-10-CM

## 2017-11-11 DIAGNOSIS — C61 Malignant neoplasm of prostate: Secondary | ICD-10-CM

## 2017-11-17 ENCOUNTER — Telehealth (HOSPITAL_COMMUNITY): Payer: Self-pay | Admitting: *Deleted

## 2017-11-18 ENCOUNTER — Other Ambulatory Visit (HOSPITAL_COMMUNITY): Payer: Self-pay | Admitting: *Deleted

## 2017-11-18 DIAGNOSIS — C61 Malignant neoplasm of prostate: Secondary | ICD-10-CM

## 2017-11-18 DIAGNOSIS — C771 Secondary and unspecified malignant neoplasm of intrathoracic lymph nodes: Principal | ICD-10-CM

## 2017-11-18 MED ORDER — MAGNESIUM OXIDE 400 MG PO TABS: 400.0000 mg | ORAL_TABLET | Freq: Three times a day (TID) | ORAL | 3 refills | Status: DC

## 2017-11-18 MED ORDER — BUMETANIDE 1 MG PO TABS: 1.0000 mg | ORAL_TABLET | Freq: Every day | ORAL | 0 refills | Status: DC

## 2017-12-30 ENCOUNTER — Other Ambulatory Visit (HOSPITAL_COMMUNITY): Payer: Self-pay | Admitting: Hematology

## 2017-12-30 DIAGNOSIS — C61 Malignant neoplasm of prostate: Secondary | ICD-10-CM

## 2017-12-30 DIAGNOSIS — C771 Secondary and unspecified malignant neoplasm of intrathoracic lymph nodes: Principal | ICD-10-CM

## 2018-01-03 ENCOUNTER — Other Ambulatory Visit (HOSPITAL_COMMUNITY): Payer: Self-pay

## 2018-01-03 DIAGNOSIS — C771 Secondary and unspecified malignant neoplasm of intrathoracic lymph nodes: Principal | ICD-10-CM

## 2018-01-03 DIAGNOSIS — C61 Malignant neoplasm of prostate: Secondary | ICD-10-CM

## 2018-01-03 MED ORDER — BUMETANIDE 1 MG PO TABS: 1.0000 mg | ORAL_TABLET | Freq: Every day | ORAL | 0 refills | Status: DC

## 2018-01-07 ENCOUNTER — Encounter (INDEPENDENT_AMBULATORY_CARE_PROVIDER_SITE_OTHER): Payer: Medicare PPO | Admitting: Ophthalmology

## 2018-01-07 DIAGNOSIS — H43813 Vitreous degeneration, bilateral: Secondary | ICD-10-CM | POA: Diagnosis not present

## 2018-01-07 DIAGNOSIS — H35371 Puckering of macula, right eye: Secondary | ICD-10-CM | POA: Diagnosis not present

## 2018-01-07 DIAGNOSIS — H353131 Nonexudative age-related macular degeneration, bilateral, early dry stage: Secondary | ICD-10-CM | POA: Diagnosis not present

## 2018-01-13 ENCOUNTER — Other Ambulatory Visit (HOSPITAL_COMMUNITY): Payer: Self-pay

## 2018-01-13 DIAGNOSIS — C61 Malignant neoplasm of prostate: Secondary | ICD-10-CM

## 2018-01-13 DIAGNOSIS — C771 Secondary and unspecified malignant neoplasm of intrathoracic lymph nodes: Principal | ICD-10-CM

## 2018-01-14 ENCOUNTER — Encounter (HOSPITAL_COMMUNITY): Payer: Self-pay | Admitting: Hematology

## 2018-01-14 ENCOUNTER — Inpatient Hospital Stay (HOSPITAL_COMMUNITY): Payer: Medicare PPO

## 2018-01-14 ENCOUNTER — Other Ambulatory Visit: Payer: Self-pay

## 2018-01-14 ENCOUNTER — Inpatient Hospital Stay (HOSPITAL_COMMUNITY): Payer: Medicare PPO | Attending: Hematology | Admitting: Hematology

## 2018-01-14 VITALS — BP 106/54 | HR 62 | Temp 98.1°F | Resp 18 | Wt 218.4 lb

## 2018-01-14 DIAGNOSIS — C61 Malignant neoplasm of prostate: Secondary | ICD-10-CM

## 2018-01-14 DIAGNOSIS — E8770 Fluid overload, unspecified: Secondary | ICD-10-CM | POA: Diagnosis not present

## 2018-01-14 DIAGNOSIS — Z95828 Presence of other vascular implants and grafts: Secondary | ICD-10-CM

## 2018-01-14 DIAGNOSIS — E291 Testicular hypofunction: Secondary | ICD-10-CM

## 2018-01-14 DIAGNOSIS — R350 Frequency of micturition: Secondary | ICD-10-CM | POA: Diagnosis not present

## 2018-01-14 DIAGNOSIS — C771 Secondary and unspecified malignant neoplasm of intrathoracic lymph nodes: Principal | ICD-10-CM

## 2018-01-14 DIAGNOSIS — G620 Drug-induced polyneuropathy: Secondary | ICD-10-CM | POA: Diagnosis not present

## 2018-01-14 DIAGNOSIS — R739 Hyperglycemia, unspecified: Secondary | ICD-10-CM | POA: Diagnosis not present

## 2018-01-14 DIAGNOSIS — Z87891 Personal history of nicotine dependence: Secondary | ICD-10-CM

## 2018-01-14 DIAGNOSIS — C778 Secondary and unspecified malignant neoplasm of lymph nodes of multiple regions: Secondary | ICD-10-CM | POA: Diagnosis not present

## 2018-01-14 LAB — COMPREHENSIVE METABOLIC PANEL
ALK PHOS: 69 U/L (ref 38–126)
ALT: 12 U/L (ref 0–44)
AST: 17 U/L (ref 15–41)
Albumin: 3.4 g/dL — ABNORMAL LOW (ref 3.5–5.0)
Anion gap: 10 (ref 5–15)
BILIRUBIN TOTAL: 0.7 mg/dL (ref 0.3–1.2)
BUN: 14 mg/dL (ref 8–23)
CALCIUM: 9.3 mg/dL (ref 8.9–10.3)
CHLORIDE: 97 mmol/L — AB (ref 98–111)
CO2: 26 mmol/L (ref 22–32)
CREATININE: 1.03 mg/dL (ref 0.61–1.24)
GFR calc Af Amer: >60 mL/min (ref >60)
Glucose, Bld: 595 mg/dL (ref 70–99)
Potassium: 4.8 mmol/L (ref 3.5–5.1)
Sodium: 133 mmol/L — ABNORMAL LOW (ref 135–145)
TOTAL PROTEIN: 6.4 g/dL — AB (ref 6.5–8.1)

## 2018-01-14 LAB — CBC WITH DIFFERENTIAL/PLATELET
BASOS ABS: 0 10*3/uL (ref 0.0–0.1)
Basophils Relative: 0 %
EOS PCT: 1 %
Eosinophils Absolute: 0.1 10*3/uL (ref 0.0–0.7)
HEMATOCRIT: 39.6 % (ref 39.0–52.0)
Hemoglobin: 12.9 g/dL — ABNORMAL LOW (ref 13.0–17.0)
LYMPHS ABS: 1.2 10*3/uL (ref 0.7–4.0)
LYMPHS PCT: 12 %
MCH: 28.2 pg (ref 26.0–34.0)
MCHC: 32.6 g/dL (ref 30.0–36.0)
MCV: 86.5 fL (ref 78.0–100.0)
Monocytes Absolute: 0.6 10*3/uL (ref 0.1–1.0)
Monocytes Relative: 6 %
NEUTROS ABS: 8.7 10*3/uL — AB (ref 1.7–7.7)
Neutrophils Relative %: 81 %
PLATELETS: 270 10*3/uL (ref 150–400)
RBC: 4.58 MIL/uL (ref 4.22–5.81)
RDW: 13.3 % (ref 11.5–15.5)
WBC: 10.7 10*3/uL — AB (ref 4.0–10.5)

## 2018-01-14 LAB — HEMOGLOBIN A1C
HEMOGLOBIN A1C: 11.8 % — AB (ref 4.8–5.6)
MEAN PLASMA GLUCOSE: 291.96 mg/dL

## 2018-01-14 LAB — PSA: PROSTATIC SPECIFIC ANTIGEN: 0.27 ng/mL (ref 0.00–4.00)

## 2018-01-14 MED ORDER — HEPARIN SOD (PORK) LOCK FLUSH 100 UNIT/ML IV SOLN
500.0000 [IU] | Freq: Once | INTRAVENOUS | Status: AC
  Administered 2018-01-14: 500 [IU] via INTRAVENOUS

## 2018-01-14 MED ORDER — SODIUM CHLORIDE 0.9% FLUSH
10.0000 mL | Freq: Once | INTRAVENOUS | Status: AC
  Administered 2018-01-14: 10 mL via INTRAVENOUS

## 2018-01-14 MED ORDER — INSULIN ASPART 100 UNIT/ML ~~LOC~~ SOLN
10.0000 [IU] | Freq: Once | SUBCUTANEOUS | Status: AC
  Administered 2018-01-14: 10 [IU] via SUBCUTANEOUS
  Filled 2018-01-14: qty 0.1

## 2018-01-14 NOTE — Patient Instructions (Signed)
[facility] at [facility]  Discharge Instructions:   _______________________________________________________________  Thank you for choosing [facility] at [facility] to provide your oncology and hematology care.  To afford each patient quality time with our providers, please arrive at least 15 minutes before your scheduled appointment.  You need to re-schedule your appointment if you arrive 10 or more minutes late.  We strive to give you quality time with our providers, and arriving late affects you and other patients whose appointments are after yours.  Also, if you no show three or more times for appointments you may be dismissed from the clinic.  Again, thank you for choosing  [facility]. Our hope is that these requests will allow you access to exceptional care and in a timely manner. _______________________________________________________________  If you have questions after your visit, please contact our office at [phone] between the hours of 8:30 a.m. and 5:00 p.m. Voicemails left after 4:30 p.m. will not be returned until the following business day. _______________________________________________________________  For prescription refill requests, have your pharmacy contact our office. _______________________________________________________________  Recommendations made by the consultant and any test results will be sent to your referring physician. _______________________________________________________________ 

## 2018-01-14 NOTE — Progress Notes (Signed)
Patients port flushed with good blood return noted.  Site clean and dry with no bruising or swelling noted at site.  Band aid applied.  Insulin SQ given as ordered.  Patient left ambulatory with no s/s of distress noted.  Family at side.

## 2018-01-14 NOTE — Patient Instructions (Addendum)
[facility] at [facility] Discharge Instructions  Follow up in 2 months with labs on the same day.   Thank you for choosing [facility] at [facility] to provide your oncology and hematology care.  To afford each patient quality time with our provider, please arrive at least 15 minutes before your scheduled appointment time.   If you have a lab appointment with the [facility] please come in thru the  Main Entrance and check in at the main information desk  You need to re-schedule your appointment should you arrive 10 or more minutes late.  We strive to give you quality time with our providers, and arriving late affects you and other patients whose appointments are after yours.  Also, if you no show three or more times for appointments you may be dismissed from the clinic at the providers discretion.     Again, thank you for choosing [facility].  Our hope is that these requests will decrease the amount of time that you wait before being seen by our physicians.       _____________________________________________________________  Should you have questions after your visit to [facility], please contact our office at [phone] between the hours of 8:00 a.m. and 4:30 p.m.  Voicemails left after 4:00 p.m. will not be returned until the following business day.  For prescription refill requests, have your pharmacy contact our office and allow 72 hours.    Cancer Center Support Programs:   > Cancer Support Group  2nd Tuesday of the month 1pm-2pm, Journey Room

## 2018-01-14 NOTE — Progress Notes (Signed)
CRITICAL VALUE ALERT  Critical Value:  Glucose 595  Date & Time Notied:  01/14/18 at [place]  Provider Notified: Dr. Delton Coombes  Orders Received/Actions taken: n/a

## 2018-01-14 NOTE — Assessment & Plan Note (Addendum)
1.  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 He is tolerating zytiga and prednisone very well.  Last PSA was 0.3 in June 2019.  His last Lupron injection was on 10/22/2017 at Dr. Wilhemina Bonito office.  He will continue 6 monthly injections.  He is continuing to work.  I reviewed his labs today.  His blood sugar is elevated at 595.  PSA from today's pending.  We will see him back in 2 months for follow-up. -We will consider checking his BRCA1/2 status if his disease progresses.  2.  Androgen deprivation therapy induced bone loss: We have started him on zoledronic acid once every 3 months for a year.  He completed fourth dose on 10/29/2017.  We plan to check his bone density in the future.  He will continue calcium and vitamin D supplements.  3.  Fluid overload: He takes Bumex 1 mg daily.  4.  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  Fingertips are numb at times.  His feet feel cold all the time.  He is able to perform all his activities.  5.  Elevated blood sugars: -Today blood sugar is 595.  He reports being thirsty.  He has also increased urination for the last couple of days.  We will check a hemoglobin A1c today.  He was given NovoLog insulin 10 units in the office today.  He was counseled to stay away from sugary drinks and foods.  We plan to start him on either metformin or glipizide depending on the hemoglobin A1c levels. - I have also asked him to check his fasting and postprandial blood sugars.  He was asked to make an appointment with Riki Altes  for closer management of diabetes close to home.

## 2018-01-14 NOTE — Progress Notes (Signed)
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 male returns for routine follow-up for metastatic prostate cancer. Patient is here today with his daughter. He has had increase urination. Increased tiredness. His mouth has been dry so he is constantly drinking water. He has numbness in his fingers. Patient appetite is 100% and energy level is 75%. Patient denies any bleeding or blood in his stool. Denies any bone pains. Denies any nausea, vomiting, or diarrhea.     REVIEW OF SYSTEMS:  Review of Systems  Genitourinary: Positive for frequency.   Neurological: Positive for numbness.  All other systems reviewed and are negative.    PAST MEDICAL/SURGICAL HISTORY:  Past Medical History:  Diagnosis Date  . Cancer [facility])    prostate  . COPD (chronic obstructive pulmonary disease) ([place])   . Hypertension    Past Surgical History:  Procedure Laterality Date  . GOLD SEED IMPLANT       SOCIAL HISTORY:  Social History   Socioeconomic History  . Marital status: Widowed    Spouse name: Not on file  . Number of children: Not on file  . Years of education: Not on file  .  Highest education level: Not on file  Occupational History  . Not on file  Social Needs  . Financial resource strain: Not on file  . Food insecurity:    Worry: Not on file    Inability: Not on file  . Transportation needs:    Medical: Not on file    Non-medical: Not on file  Tobacco Use  . Smoking status: Former Smoker    Types: Cigarettes    Last attempt to quit: 06/25/2014    Years since quitting: 3.5  . Smokeless tobacco: Never Used  . Tobacco comment: smoked since young age, quit in 2016  Substance and Sexual Activity  . Alcohol use: Not Currently  . Drug use: No  . Sexual activity: Not on file  Lifestyle  . Physical activity:    Days per week: Not on file    Minutes per session: Not on file  . Stress: Not on file  Relationships  . Social connections:    Talks on phone: Not on file    Gets together: Not on file    Attends religious service: Not on file    Active member of club or organization: Not on file    Attends meetings of clubs or organizations: Not on file    Relationship status: Not on file  . Intimate partner violence:    Fear of current or ex partner: Not on file    Emotionally abused: Not on file  Physically abused: Not on file    Forced sexual activity: Not on file  Other Topics Concern  . Not on file  Social History Narrative  . Not on file    FAMILY HISTORY:  Family History  Problem Relation Age of Onset  . Cancer Mother   . Heart attack Father     CURRENT MEDICATIONS:  Outpatient Encounter Medications as of 01/14/2018  Medication Sig  . abiraterone acetate (ZYTIGA) 500 MG tablet Take 1,000 mg by mouth daily. Take on an empty stomach 1 hour before or 2 hours after a meal.  . acetaminophen (TYLENOL) 500 MG tablet Take 500 mg by mouth every 6 (six) hours as needed for moderate pain.  Marland Kitchen aspirin EC 81 MG tablet Take 81 mg by mouth daily.  Marland Kitchen atorvastatin (LIPITOR) 10 MG tablet Take 10 mg by mouth at bedtime.  . benzonatate (TESSALON PERLES) 100  MG capsule Tessalon Perles 100 mg capsule  Take 1 capsule 3 times a day by oral route as needed.  . brimonidine (ALPHAGAN) 0.2 % ophthalmic solution Place 1 drop into both eyes 3 (three) times daily.  . bumetanide (BUMEX) 1 MG tablet Take 1 tablet (1 mg total) by mouth daily.  Marland Kitchen CALCIUM PO Take 600 mg by mouth daily.   . cetirizine (ZYRTEC) 10 MG tablet Take 10 mg by mouth daily.  Marland Kitchen CHERATUSSIN AC 100-10 MG/5ML syrup   . Cholecalciferol (VITAMIN D-3 PO) Take 1 tablet by mouth daily.  . cyclobenzaprine (FLEXERIL) 10 MG tablet Take 10 mg by mouth 2 (two) times daily.  Marland Kitchen dicyclomine (BENTYL) 10 MG capsule Take 10 mg by mouth daily.  . DOCEtaxel (TAXOTERE IV) Taxotere  . dorzolamide (TRUSOPT) 2 % ophthalmic solution Place 1 drop into both eyes 3 (three) times daily.  Marland Kitchen doxycycline (VIBRAMYCIN) 100 MG capsule doxycycline hyclate 100 mg capsule  Take 1 capsule twice a day by oral route.  do not take zinc, iron, calcium, magnesium or vitamins while on doxycycline  . ibuprofen (ADVIL,MOTRIN) 200 MG tablet Take 200 mg by mouth every 6 (six) hours as needed for moderate pain.  Marland Kitchen ibuprofen (ADVIL,MOTRIN) 600 MG tablet Take 600 mg by mouth every 6 (six) hours as needed.  . latanoprost (XALATAN) 0.005 % ophthalmic solution Place 1 drop into both eyes at bedtime.  Marland Kitchen losartan (COZAAR) 100 MG tablet Take 100 mg by mouth daily.  . magnesium oxide (MAG-OX) 400 MG tablet Take 1 tablet (400 mg total) by mouth 3 (three) times daily.  . nilutamide (NILANDRON) 150 MG tablet   . potassium chloride SA (K-DUR,KLOR-CON) 20 MEQ tablet Take 2 tablets (40 mEq total) by mouth daily.  . predniSONE (DELTASONE) 5 MG tablet Take 1 tablet (5 mg total) by mouth daily with breakfast.  . prochlorperazine (COMPAZINE) 10 MG tablet Take 10 mg by mouth every 6 (six) hours as needed for nausea or vomiting.  . Tiotropium Bromide Monohydrate (SPIRIVA RESPIMAT) 1.25 MCG/ACT AERS   . umeclidinium-vilanterol (ANORO ELLIPTA) 62.5-25 MCG/INH  AEPB Inhale 1 puff into the lungs daily.  . [DISCONTINUED] amLODipine (NORVASC) 2.5 MG tablet   . [DISCONTINUED] amLODipine (NORVASC) 2.5 MG tablet   . [DISCONTINUED] magnesium 30 MG tablet Take 400 mg by mouth daily.  . [EXPIRED] heparin lock flush 100 unit/mL   . [EXPIRED] insulin aspart (novoLOG) injection 10 Units   . [EXPIRED] sodium chloride flush (NS) 0.9 % injection 10 mL    No facility-administered encounter medications on file as of 01/14/2018.  ALLERGIES:  No Known Allergies   PHYSICAL EXAM:  ECOG Performance status: 1  Vitals:   01/14/18 1509  BP: (!) 106/54  Pulse: 62  Resp: 18  Temp: 98.1 F (36.7 C)  SpO2: 97%   Filed Weights   01/14/18 1509  Weight: 218 lb 6.4 oz (99.1 kg)    Physical Exam  Constitutional: He is oriented to person, place, and time. He appears well-developed and well-nourished.  Neurological: He is alert and oriented to person, place, and time.  Skin: Skin is warm and dry.     LABORATORY DATA:  I have reviewed the labs as listed.  CBC    Component Value Date/Time   WBC 10.7 (H) 01/14/2018 1354   RBC 4.58 01/14/2018 1354   HGB 12.9 (L) 01/14/2018 1354   HCT 39.6 01/14/2018 1354   PLT 270 01/14/2018 1354   MCV 86.5 01/14/2018 1354   MCH 28.2 01/14/2018 1354   MCHC 32.6 01/14/2018 1354   RDW 13.3 01/14/2018 1354   LYMPHSABS 1.2 01/14/2018 1354   MONOABS 0.6 01/14/2018 1354   EOSABS 0.1 01/14/2018 1354   BASOSABS 0.0 01/14/2018 1354   CMP Latest Ref Rng & Units 01/14/2018 10/29/2017 08/27/2017  Glucose 70 - 99 mg/dL 595(HH) 302(H) 147(H)  BUN 8 - 23 mg/dL _0 Creatinine 0.61 - 1.24 mg/dL 1.03 1.00 0.95  Sodium 135 - 145 mmol/L 133(L) 140 139  Potassium 3.5 - 5.1 mmol/L 4.8 4.5 4.0  Chloride 98 - 111 mmol/L 97(L) 107 105  CO2 22 - 32 mmol/L _1 Calcium 8.9 - 10.3 mg/dL 9.3 9.6 9.7  Total Protein 6.5 - 8.1 g/dL 6.4(L) 6.8 6.8  Total Bilirubin 0.3 - 1.2 mg/dL 0.7 0.8 0.8  Alkaline Phos 38 - 126 U/L 69 59 48  AST  15 - 41 U/L 17 14(L) 17  ALT 0 - 44 U/L 12 10(L) 10(L)       ASSESSMENT & PLAN:   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 He is tolerating zytiga and prednisone very well.  Last PSA was 0.3 in June 2019.  His last Lupron injection was on 10/22/2017 at Dr. Wilhemina Bonito office.  He will continue 6 monthly injections.  He is continuing to work.  I reviewed his labs today.  His blood sugar is elevated at 595.  PSA from today's pending.  We will see him back in 2 months for follow-up. -We will consider checking his BRCA1/2 status if his disease progresses.  2.  Androgen deprivation therapy induced bone loss: We have started him on zoledronic acid once every 3 months for a year.  He completed fourth dose on 10/29/2017.  We plan to check his bone density in the future.  He will continue calcium and vitamin D supplements.  3.  Fluid overload: He takes Bumex 1 mg daily.  4.  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  Fingertips are numb at times.  His feet feel cold all the time.  He is able to perform all his activities.  5.  Elevated blood sugars: -Today blood sugar is 595.  He reports being thirsty.  He has also increased urination for the last couple of days.  We will  check a hemoglobin A1c today.  He was given NovoLog insulin 10 units in the office today.  He was counseled to stay away from sugary drinks and foods.  We plan to start him on either metformin or glipizide depending on the hemoglobin A1c levels. - I have also asked him to check his fasting and postprandial blood sugars.  He was asked to make an appointment with Riki Altes  for closer management of diabetes close to  home.      Orders placed this encounter:  Orders Placed This Encounter  Procedures  . CBC with Differential/Platelet  . Comprehensive metabolic panel  . PSA      Derek Jack, MD [place] [phone]

## 2018-01-20 ENCOUNTER — Telehealth (HOSPITAL_COMMUNITY): Payer: Self-pay | Admitting: *Deleted

## 2018-01-20 NOTE — Telephone Encounter (Signed)
-----   Message from Glennie Isle, NP-C sent at 01/19/2018  6:00 PM EDT ----- They need to go to his primary care doctor to manage him. Now if he doesn't have a primary or there is an issue we do and will start a medication but we were under the impression that he was following up with his priamry.   ----- Message ----- From: Farley Ly, LPN Sent: 09/01/7351  10:30 AM EDT To: Glennie Isle, NP-C  Randi, Pt's daughter called stating that she was under the impression that medication was going to be sent in for her Dad's elevated sugars. She stated that Dr. Raliegh Ip was going to call her back and talk with her about something. Can you please check into this for me and let me know?  Thanks

## 2018-01-20 NOTE — Telephone Encounter (Signed)
I spoke with the pt's daughter and she informed me that Dr. Raliegh Ip called her Monday and told her to take her father to his primary care to manage his BS. The pt was started on medication and they have noticed some difference in his blood sugars.

## 2018-01-21 ENCOUNTER — Ambulatory Visit (HOSPITAL_COMMUNITY): Payer: Medicare PPO

## 2018-01-21 ENCOUNTER — Ambulatory Visit (HOSPITAL_COMMUNITY): Payer: Medicare PPO | Admitting: Hematology

## 2018-01-21 ENCOUNTER — Other Ambulatory Visit (HOSPITAL_COMMUNITY): Payer: Medicare PPO

## 2018-02-14 ENCOUNTER — Other Ambulatory Visit (HOSPITAL_COMMUNITY): Payer: Self-pay | Admitting: Nurse Practitioner

## 2018-02-14 DIAGNOSIS — C61 Malignant neoplasm of prostate: Secondary | ICD-10-CM

## 2018-02-14 DIAGNOSIS — C771 Secondary and unspecified malignant neoplasm of intrathoracic lymph nodes: Principal | ICD-10-CM

## 2018-02-15 ENCOUNTER — Other Ambulatory Visit (HOSPITAL_COMMUNITY): Payer: Self-pay | Admitting: *Deleted

## 2018-02-15 MED ORDER — BUMETANIDE 1 MG PO TABS: 1.0000 mg | ORAL_TABLET | Freq: Every day | ORAL | 0 refills | Status: DC

## 2018-03-16 ENCOUNTER — Other Ambulatory Visit (HOSPITAL_COMMUNITY): Payer: Self-pay | Admitting: *Deleted

## 2018-03-17 ENCOUNTER — Encounter (HOSPITAL_COMMUNITY): Payer: Medicare PPO

## 2018-03-17 ENCOUNTER — Other Ambulatory Visit (HOSPITAL_COMMUNITY): Payer: Medicare PPO

## 2018-03-17 ENCOUNTER — Ambulatory Visit (HOSPITAL_COMMUNITY): Payer: Medicare PPO | Admitting: Hematology

## 2018-03-18 ENCOUNTER — Other Ambulatory Visit (HOSPITAL_COMMUNITY): Payer: Self-pay | Admitting: *Deleted

## 2018-03-18 MED ORDER — POTASSIUM CHLORIDE CRYS ER 20 MEQ PO TBCR: 40.0000 meq | EXTENDED_RELEASE_TABLET | Freq: Every day | ORAL | 6 refills | Status: DC

## 2018-03-25 ENCOUNTER — Other Ambulatory Visit: Payer: Self-pay

## 2018-03-25 ENCOUNTER — Inpatient Hospital Stay (HOSPITAL_BASED_OUTPATIENT_CLINIC_OR_DEPARTMENT_OTHER): Payer: Medicare PPO | Admitting: Hematology

## 2018-03-25 ENCOUNTER — Inpatient Hospital Stay (HOSPITAL_COMMUNITY): Payer: Medicare PPO | Attending: Hematology

## 2018-03-25 ENCOUNTER — Encounter (HOSPITAL_COMMUNITY): Payer: Self-pay | Admitting: Hematology

## 2018-03-25 ENCOUNTER — Inpatient Hospital Stay (HOSPITAL_COMMUNITY): Payer: Medicare PPO

## 2018-03-25 VITALS — BP 139/69 | HR 52 | Temp 97.8°F | Resp 18 | Wt 223.2 lb

## 2018-03-25 DIAGNOSIS — Z87891 Personal history of nicotine dependence: Secondary | ICD-10-CM | POA: Insufficient documentation

## 2018-03-25 DIAGNOSIS — E8770 Fluid overload, unspecified: Secondary | ICD-10-CM | POA: Insufficient documentation

## 2018-03-25 DIAGNOSIS — C771 Secondary and unspecified malignant neoplasm of intrathoracic lymph nodes: Secondary | ICD-10-CM | POA: Diagnosis not present

## 2018-03-25 DIAGNOSIS — E1142 Type 2 diabetes mellitus with diabetic polyneuropathy: Secondary | ICD-10-CM

## 2018-03-25 DIAGNOSIS — C61 Malignant neoplasm of prostate: Secondary | ICD-10-CM | POA: Insufficient documentation

## 2018-03-25 DIAGNOSIS — Z7984 Long term (current) use of oral hypoglycemic drugs: Secondary | ICD-10-CM | POA: Insufficient documentation

## 2018-03-25 DIAGNOSIS — Z79899 Other long term (current) drug therapy: Secondary | ICD-10-CM | POA: Diagnosis not present

## 2018-03-25 DIAGNOSIS — E291 Testicular hypofunction: Secondary | ICD-10-CM

## 2018-03-25 LAB — CBC WITH DIFFERENTIAL/PLATELET
Abs Immature Granulocytes: 0.04 10*3/uL (ref 0.00–0.07)
Basophils Absolute: 0.1 10*3/uL (ref 0.0–0.1)
Basophils Relative: 1 %
EOS PCT: 1 %
Eosinophils Absolute: 0.2 10*3/uL (ref 0.0–0.5)
HEMATOCRIT: 38.1 % — AB (ref 39.0–52.0)
HEMOGLOBIN: 11.4 g/dL — AB (ref 13.0–17.0)
Immature Granulocytes: 0 %
LYMPHS ABS: 1.2 10*3/uL (ref 0.7–4.0)
Lymphocytes Relative: 9 %
MCH: 26.5 pg (ref 26.0–34.0)
MCHC: 29.9 g/dL — ABNORMAL LOW (ref 30.0–36.0)
MCV: 88.6 fL (ref 80.0–100.0)
Monocytes Absolute: 1 10*3/uL (ref 0.1–1.0)
Monocytes Relative: 8 %
NEUTROS PCT: 81 %
NRBC: 0 % (ref 0.0–0.2)
Neutro Abs: 10.3 10*3/uL — ABNORMAL HIGH (ref 1.7–7.7)
Platelets: 326 10*3/uL (ref 150–400)
RBC: 4.3 MIL/uL (ref 4.22–5.81)
RDW: 13.5 % (ref 11.5–15.5)
WBC: 12.8 10*3/uL — AB (ref 4.0–10.5)

## 2018-03-25 LAB — COMPREHENSIVE METABOLIC PANEL
ALK PHOS: 50 U/L (ref 38–126)
ALT: 9 U/L (ref 0–44)
AST: 15 U/L (ref 15–41)
Albumin: 3.7 g/dL (ref 3.5–5.0)
Anion gap: 7 (ref 5–15)
BILIRUBIN TOTAL: 0.7 mg/dL (ref 0.3–1.2)
BUN: 12 mg/dL (ref 8–23)
CALCIUM: 9.4 mg/dL (ref 8.9–10.3)
CHLORIDE: 106 mmol/L (ref 98–111)
CO2: 27 mmol/L (ref 22–32)
CREATININE: 0.78 mg/dL (ref 0.61–1.24)
GFR calc Af Amer: >60 mL/min (ref >60)
Glucose, Bld: 141 mg/dL — ABNORMAL HIGH (ref 70–99)
Potassium: 4.2 mmol/L (ref 3.5–5.1)
Sodium: 140 mmol/L (ref 135–145)
Total Protein: 6.6 g/dL (ref 6.5–8.1)

## 2018-03-25 LAB — PSA: Prostatic Specific Antigen: 0.41 ng/mL (ref 0.00–4.00)

## 2018-03-25 MED ORDER — HEPARIN SOD (PORK) LOCK FLUSH 100 UNIT/ML IV SOLN
500.0000 [IU] | Freq: Once | INTRAVENOUS | Status: AC
  Administered 2018-03-25: 500 [IU] via INTRAVENOUS

## 2018-03-25 MED ORDER — HEPARIN SOD (PORK) LOCK FLUSH 100 UNIT/ML IV SOLN
INTRAVENOUS | Status: AC
  Filled 2018-03-25: qty 5

## 2018-03-25 MED ORDER — SODIUM CHLORIDE 0.9% FLUSH
10.0000 mL | INTRAVENOUS | Status: DC | PRN
  Administered 2018-03-25: 10 mL via INTRAVENOUS
  Filled 2018-03-25: qty 10

## 2018-03-25 NOTE — Assessment & Plan Note (Addendum)
1.  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 -Last Lupron injection was on 10/22/2017 at Dr. Gala Lewandowsky office. -Last PSA was 0.27.  Today his LFTs were within normal limits.  PSA from today's pending.  I will see him back in 2 months for follow-up. -We will consider checking BRCA1/2 status if his disease progresses.  2.  Androgen deprivation therapy induced bone loss: We have started him on zoledronic acid once every 3 months for a year.  He completed fourth dose on 10/29/2017.  We plan to check his bone density in the future.  He will continue calcium and vitamin D supplements.  3.  Fluid overload: He takes Bumex 1 mg daily.  4.  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  Fingertips are numb at times.  His feet feel cold all the time.  He is able to perform all his activities.  5.  Diabetes mellitus: -At last visit he was diagnosed with diabetes.  He is currently on metformin thousand milligrams twice daily.  Blood sugars are mostly staying in the preferred range.

## 2018-03-25 NOTE — Progress Notes (Signed)
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 Patient is here today with his daughter. He is doing well. He has recently started on Metformin BID and his sugars are improved. He is taking his Zytiga as prescribed and shows very few side effects. He has recently had bronchitis and is currently on antibiotic for that. He denies any hemoptysis. Denies any any hematuria or dark stools. Denies any nausea, vomiting, or diarrhea. He reports his appetite and energy level at 75%. He has no problems maintaining his weight at this time. He lives at home and performs all his own ADLs.     REVIEW OF SYSTEMS:  Review of Systems  Constitutional: Positive for fatigue.  All other systems reviewed and are negative.    PAST MEDICAL/SURGICAL HISTORY:  Past Medical History:  Diagnosis Date  . Cancer [facility])    prostate  . COPD (chronic obstructive pulmonary disease) ([place])   . Hypertension    Past Surgical History:  Procedure Laterality Date  . GOLD SEED IMPLANT       SOCIAL HISTORY:  Social  History   Socioeconomic History  . Marital status: Widowed    Spouse name: Not on file  . Number of children: Not on file  . Years of education: Not on file  . Highest education level: Not on file  Occupational History  . Not on file  Social Needs  . Financial resource strain: Not on file  . Food insecurity:    Worry: Not on file    Inability: Not on file  . Transportation needs:    Medical: Not on file    Non-medical: Not on file  Tobacco Use  . Smoking status: Former Smoker    Types: Cigarettes    Last attempt to quit: 06/25/2014    Years since quitting: 3.7  . Smokeless tobacco: Never Used  . Tobacco comment: smoked since young age, quit in 2016  Substance and Sexual Activity  . Alcohol use: Not Currently  . Drug use: No  . Sexual activity: Not on file  Lifestyle  . Physical activity:    Days per week: Not on file    Minutes per session: Not on file  . Stress: Not on file  Relationships  . Social connections:    Talks on phone: Not on file    Gets together: Not on file    Attends religious service: Not on file    Active member of club or organization: Not on file    Attends meetings of clubs or organizations: Not on file    Relationship status:  Not on file  . Intimate partner violence:    Fear of current or ex partner: Not on file    Emotionally abused: Not on file    Physically abused: Not on file    Forced sexual activity: Not on file  Other Topics Concern  . Not on file  Social History Narrative  . Not on file    FAMILY HISTORY:  Family History  Problem Relation Age of Onset  . Cancer Mother   . Heart attack Father     CURRENT MEDICATIONS:  Outpatient Encounter Medications as of 03/25/2018  Medication Sig  . abiraterone acetate (ZYTIGA) 500 MG tablet Take 1,000 mg by mouth daily. Take on an empty stomach 1 hour before or 2 hours after a meal.  . acetaminophen (TYLENOL) 500 MG tablet Take 500 mg by mouth every 6 (six) hours as needed for moderate  pain.  Marland Kitchen aspirin EC 81 MG tablet Take 81 mg by mouth daily.  Marland Kitchen atorvastatin (LIPITOR) 10 MG tablet Take 10 mg by mouth at bedtime.  . benzonatate (TESSALON PERLES) 100 MG capsule Tessalon Perles 100 mg capsule  Take 1 capsule 3 times a day by oral route as needed.  . brimonidine (ALPHAGAN) 0.2 % ophthalmic solution Place 1 drop into both eyes 3 (three) times daily.  . bumetanide (BUMEX) 1 MG tablet Take 1 tablet (1 mg total) by mouth daily.  Marland Kitchen CALCIUM PO Take 600 mg by mouth daily.   . cetirizine (ZYRTEC) 10 MG tablet Take 10 mg by mouth daily.  Marland Kitchen CHERATUSSIN AC 100-10 MG/5ML syrup   . Cholecalciferol (VITAMIN D-3 PO) Take 1 tablet by mouth daily.  . cyclobenzaprine (FLEXERIL) 10 MG tablet Take 10 mg by mouth as needed.   . dicyclomine (BENTYL) 10 MG capsule Take 10 mg by mouth daily.  . DOCEtaxel (TAXOTERE IV) Taxotere  . dorzolamide (TRUSOPT) 2 % ophthalmic solution Place 1 drop into both eyes 3 (three) times daily.  . fluticasone (FLONASE) 50 MCG/ACT nasal spray USE 2 SPRAY(S) IN EACH NOSTRIL ONCE DAILY FOR 30 DAYS  . glipiZIDE (GLUCOTROL) 5 MG tablet TAKE 1 TABLET BY MOUTH ONCE DAILY IN THE MORNING FOR 30 DAYS  . ibuprofen (ADVIL,MOTRIN) 200 MG tablet Take 200 mg by mouth every 6 (six) hours as needed for moderate pain.  Marland Kitchen ibuprofen (ADVIL,MOTRIN) 600 MG tablet Take 600 mg by mouth every 6 (six) hours as needed.  . latanoprost (XALATAN) 0.005 % ophthalmic solution Place 1 drop into both eyes at bedtime.  Marland Kitchen losartan (COZAAR) 100 MG tablet Take 100 mg by mouth daily.  . magnesium oxide (MAG-OX) 400 MG tablet Take 1 tablet (400 mg total) by mouth 3 (three) times daily.  . metFORMIN (GLUCOPHAGE) 500 MG tablet TAKE 2 TABLETS BY MOUTH ONCE DAILY WITH A MEAL IN THE EVENING  . nilutamide (NILANDRON) 150 MG tablet   . potassium chloride SA (K-DUR,KLOR-CON) 20 MEQ tablet Take 2 tablets (40 mEq total) by mouth daily.  . predniSONE (DELTASONE) 5 MG tablet Take 1 tablet (5 mg total) by mouth daily  with breakfast.  . prochlorperazine (COMPAZINE) 10 MG tablet Take 10 mg by mouth every 6 (six) hours as needed for nausea or vomiting.  . Tiotropium Bromide Monohydrate (SPIRIVA RESPIMAT) 1.25 MCG/ACT AERS   . umeclidinium-vilanterol (ANORO ELLIPTA) 62.5-25 MCG/INH AEPB Inhale 1 puff into the lungs daily.  . [DISCONTINUED] bumetanide (BUMEX) 1 MG tablet TAKE 1 TABLET BY MOUTH ONCE DAILY  . [DISCONTINUED] doxycycline (VIBRAMYCIN) 100 MG capsule doxycycline  hyclate 100 mg capsule  Take 1 capsule twice a day by oral route.  do not take zinc, iron, calcium, magnesium or vitamins while on doxycycline   No facility-administered encounter medications on file as of 03/25/2018.     ALLERGIES:  No Known Allergies   PHYSICAL EXAM:  ECOG Performance status: 1  Vitals:   03/25/18 1353  BP: 139/69  Pulse: (!) 52  Resp: 18  Temp: 97.8 F (36.6 C)  SpO2: 96%   Filed Weights   03/25/18 1353  Weight: 223 lb 3.2 oz (101.2 kg)    Physical Exam  Constitutional: He is oriented to person, place, and time. He appears well-developed and well-nourished.  Neck: Normal range of motion. Neck supple.  Cardiovascular: Normal rate, regular rhythm and normal heart sounds.  Pulmonary/Chest: Effort normal and breath sounds normal.  Abdominal: Soft.  Musculoskeletal: Normal range of motion.  Neurological: He is alert and oriented to person, place, and time.  Skin: Skin is warm and dry.  Psychiatric: He has a normal mood and affect. His behavior is normal. Judgment and thought content normal.     LABORATORY DATA:  I have reviewed the labs as listed.  CBC    Component Value Date/Time   WBC 12.8 (H) 03/25/2018 1307   RBC 4.30 03/25/2018 1307   HGB 11.4 (L) 03/25/2018 1307   HCT 38.1 (L) 03/25/2018 1307   PLT 326 03/25/2018 1307   MCV 88.6 03/25/2018 1307   MCH 26.5 03/25/2018 1307   MCHC 29.9 (L) 03/25/2018 1307   RDW 13.5 03/25/2018 1307   LYMPHSABS 1.2 03/25/2018 1307   MONOABS 1.0 03/25/2018  1307   EOSABS 0.2 03/25/2018 1307   BASOSABS 0.1 03/25/2018 1307   CMP Latest Ref Rng & Units 03/25/2018 01/14/2018 10/29/2017  Glucose 70 - 99 mg/dL 141(H) 595(HH) 302(H)  BUN 8 - 23 mg/dL 12 14 14   Creatinine 0.61 - 1.24 mg/dL 0.78 1.03 1.00  Sodium 135 - 145 mmol/L 140 133(L) 140  Potassium 3.5 - 5.1 mmol/L 4.2 4.8 4.5  Chloride 98 - 111 mmol/L 106 97(L) 107  CO2 22 - 32 mmol/L 27 26 25   Calcium 8.9 - 10.3 mg/dL 9.4 9.3 9.6  Total Protein 6.5 - 8.1 g/dL 6.6 6.4(L) 6.8  Total Bilirubin 0.3 - 1.2 mg/dL 0.7 0.7 0.8  Alkaline Phos 38 - 126 U/L 50 69 59  AST 15 - 41 U/L 15 17 14(L)  ALT 0 - 44 U/L 9 12 10(L)         ASSESSMENT & PLAN:   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 -Last Lupron injection was on 10/22/2017 at Dr. Gala Lewandowsky office. -Last PSA was 0.27.  Today his LFTs were within normal limits.  PSA from today's pending.  I will see him back in 2 months for follow-up. -We will consider checking BRCA1/2 status if his disease progresses.  2.  Androgen deprivation therapy induced bone loss: We have started him on zoledronic acid once every 3 months for a year.  He completed fourth dose on 10/29/2017.  We plan to check his bone density in the future.  He will continue calcium and vitamin D supplements.  3.  Fluid overload: He takes Bumex 1 mg daily.  4.  Peripheral  neuropathy: He has grade 1 neuropathy in the fingertips and feet.  Fingertips are numb at times.  His feet feel cold all the time.  He is able to perform all his activities.  5.  Diabetes mellitus: -At last visit he was diagnosed with diabetes.  He is currently on metformin thousand milligrams twice daily.  Blood sugars are mostly staying  in the preferred range.      Orders placed this encounter:  Orders Placed This Encounter  Procedures  . CBC with Differential/Platelet  . Comprehensive metabolic panel  . PSA      Derek Jack, MD [place] [phone]

## 2018-03-25 NOTE — Progress Notes (Signed)
Hulda Humphrey presented for Portacath access and flush.  Portacath located right chest wall accessed with  H 20 needle.  Good blood return present. Portacath flushed with 37ml NS and 500U/69ml Heparin and needle removed intact.  Procedure tolerated well and without incident.  Discharged ambulatory in c/o family.

## 2018-03-25 NOTE — Patient Instructions (Signed)
[facility] Discharge Instructions  Follow up in 2 months with labs   Thank you for choosing [facility] at [facility] to provide your oncology and hematology care.  To afford each patient quality time with our provider, please arrive at least 15 minutes before your scheduled appointment time.   If you have a lab appointment with the Cancer Center please come in thru the  Main Entrance and check in at the main information desk  You need to re-schedule your appointment should you arrive 10 or more minutes late.  We strive to give you quality time with our providers, and arriving late affects you and other patients whose appointments are after yours.  Also, if you no show three or more times for appointments you may be dismissed from the clinic at the providers discretion.     Again, thank you for choosing [facility].  Our hope is that these requests will decrease the amount of time that you wait before being seen by our physicians.       _____________________________________________________________  Should you have questions after your visit to [facility], please contact our office at [phone] between the hours of 8:00 a.m. and 4:30 p.m.  Voicemails left after 4:00 p.m. will not be returned until the following business day.  For prescription refill requests, have your pharmacy contact our office and allow 72 hours.    Cancer Center Support Programs:   > Cancer Support Group  2nd Tuesday of the month 1pm-2pm, Journey Room    

## 2018-04-04 ENCOUNTER — Telehealth (HOSPITAL_COMMUNITY): Payer: Self-pay | Admitting: Surgery

## 2018-04-04 NOTE — Telephone Encounter (Signed)
Pt's daughter [name] left a voicemail this morning asking for a nurse to call back regarding her father.  Called and spoke to [name] who wanted to know what the increase in the her father's PSA meant.  Notified Dr. Raliegh Ip, who spoke to the family.

## 2018-04-18 ENCOUNTER — Other Ambulatory Visit (HOSPITAL_COMMUNITY): Payer: Self-pay | Admitting: Hematology

## 2018-04-18 DIAGNOSIS — C771 Secondary and unspecified malignant neoplasm of intrathoracic lymph nodes: Principal | ICD-10-CM

## 2018-04-18 DIAGNOSIS — C61 Malignant neoplasm of prostate: Secondary | ICD-10-CM

## 2018-04-19 ENCOUNTER — Other Ambulatory Visit (HOSPITAL_COMMUNITY): Payer: Self-pay | Admitting: *Deleted

## 2018-04-19 DIAGNOSIS — C61 Malignant neoplasm of prostate: Secondary | ICD-10-CM

## 2018-04-19 DIAGNOSIS — C771 Secondary and unspecified malignant neoplasm of intrathoracic lymph nodes: Principal | ICD-10-CM

## 2018-04-19 MED ORDER — PREDNISONE 5 MG PO TABS: 5.0000 mg | ORAL_TABLET | Freq: Every day | ORAL | 1 refills | Status: DC

## 2018-05-12 ENCOUNTER — Other Ambulatory Visit (HOSPITAL_COMMUNITY): Payer: Self-pay | Admitting: Nurse Practitioner

## 2018-05-12 DIAGNOSIS — C61 Malignant neoplasm of prostate: Secondary | ICD-10-CM

## 2018-05-12 DIAGNOSIS — C771 Secondary and unspecified malignant neoplasm of intrathoracic lymph nodes: Principal | ICD-10-CM

## 2018-05-13 ENCOUNTER — Other Ambulatory Visit (HOSPITAL_COMMUNITY): Payer: Self-pay | Admitting: *Deleted

## 2018-05-27 ENCOUNTER — Other Ambulatory Visit (HOSPITAL_COMMUNITY): Payer: Self-pay | Admitting: Hematology

## 2018-05-27 NOTE — Telephone Encounter (Signed)
Per Dr. Fransico Michael last note patient will continue Zytiga. Refills sent to pharmacy.

## 2018-05-31 ENCOUNTER — Encounter (HOSPITAL_COMMUNITY): Payer: 59

## 2018-05-31 ENCOUNTER — Ambulatory Visit (HOSPITAL_COMMUNITY): Payer: 59 | Admitting: Hematology

## 2018-05-31 ENCOUNTER — Other Ambulatory Visit (HOSPITAL_COMMUNITY): Payer: 59

## 2018-06-10 ENCOUNTER — Inpatient Hospital Stay (HOSPITAL_COMMUNITY): Payer: Medicare PPO | Attending: Hematology

## 2018-06-10 DIAGNOSIS — C61 Malignant neoplasm of prostate: Secondary | ICD-10-CM | POA: Insufficient documentation

## 2018-06-10 DIAGNOSIS — C771 Secondary and unspecified malignant neoplasm of intrathoracic lymph nodes: Secondary | ICD-10-CM | POA: Diagnosis not present

## 2018-06-10 DIAGNOSIS — Z8042 Family history of malignant neoplasm of prostate: Secondary | ICD-10-CM | POA: Insufficient documentation

## 2018-06-10 DIAGNOSIS — E291 Testicular hypofunction: Secondary | ICD-10-CM | POA: Diagnosis not present

## 2018-06-10 DIAGNOSIS — Z87891 Personal history of nicotine dependence: Secondary | ICD-10-CM | POA: Diagnosis not present

## 2018-06-10 DIAGNOSIS — Z800 Family history of malignant neoplasm of digestive organs: Secondary | ICD-10-CM | POA: Diagnosis not present

## 2018-06-10 DIAGNOSIS — G620 Drug-induced polyneuropathy: Secondary | ICD-10-CM | POA: Insufficient documentation

## 2018-06-10 LAB — CBC WITH DIFFERENTIAL/PLATELET
Abs Immature Granulocytes: 0.04 10*3/uL (ref 0.00–0.07)
BASOS PCT: 1 %
Basophils Absolute: 0.1 10*3/uL (ref 0.0–0.1)
EOS ABS: 0.1 10*3/uL (ref 0.0–0.5)
Eosinophils Relative: 1 %
HCT: 37.9 % — ABNORMAL LOW (ref 39.0–52.0)
Hemoglobin: 11.6 g/dL — ABNORMAL LOW (ref 13.0–17.0)
Immature Granulocytes: 0 %
Lymphocytes Relative: 14 %
Lymphs Abs: 1.5 10*3/uL (ref 0.7–4.0)
MCH: 27 pg (ref 26.0–34.0)
MCHC: 30.6 g/dL (ref 30.0–36.0)
MCV: 88.1 fL (ref 80.0–100.0)
MONO ABS: 0.9 10*3/uL (ref 0.1–1.0)
MONOS PCT: 8 %
NEUTROS PCT: 76 %
Neutro Abs: 8.4 10*3/uL — ABNORMAL HIGH (ref 1.7–7.7)
Platelets: 287 10*3/uL (ref 150–400)
RBC: 4.3 MIL/uL (ref 4.22–5.81)
RDW: 14.5 % (ref 11.5–15.5)
WBC: 11 10*3/uL — AB (ref 4.0–10.5)
nRBC: 0 % (ref 0.0–0.2)

## 2018-06-10 LAB — COMPREHENSIVE METABOLIC PANEL
ALK PHOS: 42 U/L (ref 38–126)
ALT: 9 U/L (ref 0–44)
AST: 16 U/L (ref 15–41)
Albumin: 3.9 g/dL (ref 3.5–5.0)
Anion gap: 5 (ref 5–15)
BUN: 15 mg/dL (ref 8–23)
CALCIUM: 9.6 mg/dL (ref 8.9–10.3)
CO2: 28 mmol/L (ref 22–32)
CREATININE: 0.74 mg/dL (ref 0.61–1.24)
Chloride: 106 mmol/L (ref 98–111)
Glucose, Bld: 86 mg/dL (ref 70–99)
Potassium: 4.3 mmol/L (ref 3.5–5.1)
Sodium: 139 mmol/L (ref 135–145)
Total Bilirubin: 0.8 mg/dL (ref 0.3–1.2)
Total Protein: 6.7 g/dL (ref 6.5–8.1)

## 2018-06-10 LAB — PSA: PROSTATIC SPECIFIC ANTIGEN: 0.47 ng/mL (ref 0.00–4.00)

## 2018-06-16 ENCOUNTER — Other Ambulatory Visit (HOSPITAL_COMMUNITY): Payer: Self-pay | Admitting: Hematology

## 2018-06-17 ENCOUNTER — Other Ambulatory Visit: Payer: Self-pay

## 2018-06-17 ENCOUNTER — Encounter (HOSPITAL_COMMUNITY): Payer: Self-pay | Admitting: Hematology

## 2018-06-17 ENCOUNTER — Inpatient Hospital Stay (HOSPITAL_BASED_OUTPATIENT_CLINIC_OR_DEPARTMENT_OTHER): Payer: Medicare PPO | Admitting: Hematology

## 2018-06-17 ENCOUNTER — Other Ambulatory Visit (HOSPITAL_COMMUNITY): Payer: Self-pay | Admitting: *Deleted

## 2018-06-17 ENCOUNTER — Inpatient Hospital Stay (HOSPITAL_COMMUNITY): Payer: Medicare PPO

## 2018-06-17 VITALS — BP 168/67 | HR 48 | Temp 98.3°F | Resp 16 | Wt 217.0 lb

## 2018-06-17 DIAGNOSIS — C771 Secondary and unspecified malignant neoplasm of intrathoracic lymph nodes: Secondary | ICD-10-CM

## 2018-06-17 DIAGNOSIS — C61 Malignant neoplasm of prostate: Secondary | ICD-10-CM | POA: Diagnosis not present

## 2018-06-17 DIAGNOSIS — E291 Testicular hypofunction: Secondary | ICD-10-CM

## 2018-06-17 DIAGNOSIS — G620 Drug-induced polyneuropathy: Secondary | ICD-10-CM

## 2018-06-17 DIAGNOSIS — Z8042 Family history of malignant neoplasm of prostate: Secondary | ICD-10-CM

## 2018-06-17 DIAGNOSIS — Z800 Family history of malignant neoplasm of digestive organs: Secondary | ICD-10-CM

## 2018-06-17 MED ORDER — HEPARIN SOD (PORK) LOCK FLUSH 100 UNIT/ML IV SOLN
500.0000 [IU] | Freq: Once | INTRAVENOUS | Status: AC
  Administered 2018-06-17: 500 [IU] via INTRAVENOUS

## 2018-06-17 MED ORDER — SODIUM CHLORIDE 0.9% FLUSH
10.0000 mL | INTRAVENOUS | Status: DC | PRN
  Administered 2018-06-17: 10 mL via INTRAVENOUS
  Filled 2018-06-17: qty 10

## 2018-06-17 MED ORDER — ABIRATERONE ACETATE 500 MG PO TABS: ORAL_TABLET | ORAL | 0 refills | Status: DC

## 2018-06-17 NOTE — Assessment & Plan Note (Signed)
1.  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 -Last Lupron injection was in December 2019 at Dr. Wilhemina Bonito office. -PSA has steadily increased last 2 times.  Today PSA 0.47.  We will continue Zytiga and prednisone at this time. -He will come back in 2 months for follow-up with repeat blood count.  2.  Androgen deprivation therapy induced bone loss:  -He received zoledronic acid every 3 months for a year which completed on 10/29/2017. -We will plan to repeat bone density in the future. -She will continue calcium and vitamin D supplements.   3.  Fluid overload: He will continue Bumex 1 mg daily.  4.  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  He is able to do all his activities.   5.  Family history: -Maternal uncle had prostate cancer and mother had stomach cancer. -We will consider mutation testing for BRCA 1 and 2, which will give [place] other options for targeted therapy.

## 2018-06-17 NOTE — Telephone Encounter (Signed)
After office visit today, new script for Zytiga sent to patient's pharmacy.

## 2018-06-17 NOTE — Progress Notes (Signed)
[address] [place], [place] [address]   CLINIC:  Medical Oncology/Hematology  PCP:  Joseph Art, MD [address] [place] [address] [phone]   REASON FOR VISIT: Follow-up for metastatic prostate cancer to intrathoracic lymph node  CURRENT THERAPY: Zytiga and prednisone  BRIEF ONCOLOGIC HISTORY:    Prostate cancer metastatic to intrathoracic lymph node ([place])   07/24/2015 Initial Diagnosis    Prostate cancer metastatic to the left supraclavicular and anterior mediastinal lymph nodes    08/21/2015 - 06/12/2016 Chemotherapy    Docetaxel every 3 weeks for 14 cycles, discontinued as PSA has plateaued around 11     07/13/2016 Treatment Plan Change    Zytiga 1000 milligrams daily along with prednisone 5 mg daily, started secondary to fluid overload from docetaxel      INTERVAL HISTORY:  [name] 78 y.o. male returns for routine follow-up for metastatic prostate cancer to intrathoracic lymph node. He is here today with his daughter and doing well. He did have a cold a few weeks ago but has recovered. He reports his feet stay cold but denies any pain or redness. Denies any nausea, vomiting, or diarrhea. Denies any new pains. Had not noticed any recent bleeding such as epistaxis, hematuria or hematochezia. Denies recent chest pain on exertion, shortness of breath on minimal exertion, pre-syncopal episodes, or palpitations. Denies any numbness or tingling in hands or feet. Denies any recent fevers, infections, or recent hospitalizations. Patient reports appetite at 100% and energy level at 100%.   REVIEW OF SYSTEMS:  Review of Systems  Neurological: Positive for numbness.  All other systems reviewed and are negative.    PAST MEDICAL/SURGICAL HISTORY:  Past Medical History:  Diagnosis Date  . Cancer [facility])    prostate  . COPD (chronic obstructive pulmonary disease) ([place])   . Hypertension    Past Surgical History:    Procedure Laterality Date  . GOLD SEED IMPLANT       SOCIAL HISTORY:  Social History   Socioeconomic History  . Marital status: Widowed    Spouse name: Not on file  . Number of children: Not on file  . Years of education: Not on file  . Highest education level: Not on file  Occupational History  . Not on file  Social Needs  . Financial resource strain: Not on file  . Food insecurity:    Worry: Not on file    Inability: Not on file  . Transportation needs:    Medical: Not on file    Non-medical: Not on file  Tobacco Use  . Smoking status: Former Smoker    Types: Cigarettes    Last attempt to quit: 06/25/2014    Years since quitting: 3.9  . Smokeless tobacco: Never Used  . Tobacco comment: smoked since young age, quit in 2016  Substance and Sexual Activity  . Alcohol use: Not Currently  . Drug use: No  . Sexual activity: Not on file  Lifestyle  . Physical activity:    Days per week: Not on file    Minutes per session: Not on file  . Stress: Not on file  Relationships  . Social connections:    Talks on phone: Not on file    Gets together: Not on file    Attends religious service: Not on file    Active member of club or organization: Not on file    Attends meetings of clubs or organizations: Not on file  Relationship status: Not on file  . Intimate partner violence:    Fear of current or ex partner: Not on file    Emotionally abused: Not on file    Physically abused: Not on file    Forced sexual activity: Not on file  Other Topics Concern  . Not on file  Social History Narrative  . Not on file    FAMILY HISTORY:  Family History  Problem Relation Age of Onset  . Cancer Mother   . Heart attack Father     CURRENT MEDICATIONS:  Outpatient Encounter Medications as of 06/17/2018  Medication Sig  . aspirin EC 81 MG tablet Take 81 mg by mouth daily.  Marland Kitchen atorvastatin (LIPITOR) 10 MG tablet Take 10 mg by mouth at bedtime.  . brimonidine (ALPHAGAN) 0.2 %  ophthalmic solution Place 1 drop into both eyes 3 (three) times daily.  . bumetanide (BUMEX) 1 MG tablet TAKE 1 TABLET BY MOUTH ONCE DAILY  . CALCIUM PO Take 600 mg by mouth daily.   . cetirizine (ZYRTEC) 10 MG tablet Take 10 mg by mouth daily.  . Cholecalciferol (VITAMIN D-3 PO) Take 1 tablet by mouth daily.  . DOCEtaxel (TAXOTERE IV) Taxotere  . dorzolamide (TRUSOPT) 2 % ophthalmic solution Place 1 drop into both eyes 3 (three) times daily.  . fluticasone (FLONASE) 50 MCG/ACT nasal spray USE 2 SPRAY(S) IN EACH NOSTRIL ONCE DAILY FOR 30 DAYS  . latanoprost (XALATAN) 0.005 % ophthalmic solution Place 1 drop into both eyes at bedtime.  Marland Kitchen losartan (COZAAR) 100 MG tablet Take 100 mg by mouth daily.  . magnesium oxide (MAG-OX) 400 MG tablet Take 1 tablet (400 mg total) by mouth 3 (three) times daily.  . metFORMIN (GLUCOPHAGE) 500 MG tablet TAKE 2 TABLETS BY MOUTH ONCE DAILY WITH A MEAL IN THE EVENING  . nilutamide (NILANDRON) 150 MG tablet   . potassium chloride SA (K-DUR,KLOR-CON) 20 MEQ tablet Take 2 tablets (40 mEq total) by mouth daily.  . predniSONE (DELTASONE) 5 MG tablet Take 1 tablet (5 mg total) by mouth daily with breakfast.  . Tiotropium Bromide Monohydrate (SPIRIVA RESPIMAT) 1.25 MCG/ACT AERS   . ZYTIGA 500 MG tablet TAKE 2 TABLETS BY MOUTH ONCE DAILY AS DIRECTED.  TAKE 1 HOUR BEFORE OR 2 HOURS AFTER A MEAL  . [DISCONTINUED] glipiZIDE (GLUCOTROL) 5 MG tablet TAKE 1 TABLET BY MOUTH ONCE DAILY IN THE MORNING FOR 30 DAYS  . acetaminophen (TYLENOL) 500 MG tablet Take 500 mg by mouth every 6 (six) hours as needed for moderate pain.  Marland Kitchen CHERATUSSIN AC 100-10 MG/5ML syrup   . cyclobenzaprine (FLEXERIL) 10 MG tablet Take 10 mg by mouth as needed.   . dicyclomine (BENTYL) 10 MG capsule Take 10 mg by mouth daily.  Marland Kitchen ibuprofen (ADVIL,MOTRIN) 200 MG tablet Take 200 mg by mouth every 6 (six) hours as needed for moderate pain.  Marland Kitchen ibuprofen (ADVIL,MOTRIN) 600 MG tablet Take 600 mg by mouth every 6  (six) hours as needed.  . prochlorperazine (COMPAZINE) 10 MG tablet Take 10 mg by mouth every 6 (six) hours as needed for nausea or vomiting.  . [DISCONTINUED] benzonatate (TESSALON PERLES) 100 MG capsule Tessalon Perles 100 mg capsule  Take 1 capsule 3 times a day by oral route as needed.  . [DISCONTINUED] umeclidinium-vilanterol (ANORO ELLIPTA) 62.5-25 MCG/INH AEPB Inhale 1 puff into the lungs daily.   Facility-Administered Encounter Medications as of 06/17/2018  Medication  . [COMPLETED] heparin lock flush 100 unit/mL  . sodium chloride flush (  NS) 0.9 % injection 10 mL    ALLERGIES:  No Known Allergies   PHYSICAL EXAM:  ECOG Performance status: 1  Vitals:   06/17/18 1100  BP: (!) 168/67  Pulse: (!) 48  Resp: 16  Temp: 98.3 F (36.8 C)  SpO2: 99%   Filed Weights   06/17/18 1100  Weight: 217 lb (98.4 kg)    Physical Exam Constitutional:      Appearance: Normal appearance. He is normal weight.  Cardiovascular:     Rate and Rhythm: Normal rate and regular rhythm.     Heart sounds: Normal heart sounds.  Pulmonary:     Effort: Pulmonary effort is normal.     Breath sounds: Normal breath sounds.  Musculoskeletal: Normal range of motion.  Skin:    General: Skin is warm and dry.  Neurological:     Mental Status: He is alert and oriented to person, place, and time. Mental status is at baseline.  Psychiatric:        Mood and Affect: Mood normal.        Behavior: Behavior normal.        Thought Content: Thought content normal.        Judgment: Judgment normal.    Vaguely palpable left supraclavicular lymphadenopathy.  LABORATORY DATA:  I have reviewed the labs as listed.  CBC    Component Value Date/Time   WBC 11.0 (H) 06/10/2018 1539   RBC 4.30 06/10/2018 1539   HGB 11.6 (L) 06/10/2018 1539   HCT 37.9 (L) 06/10/2018 1539   PLT 287 06/10/2018 1539   MCV 88.1 06/10/2018 1539   MCH 27.0 06/10/2018 1539   MCHC 30.6 06/10/2018 1539   RDW 14.5 06/10/2018 1539    LYMPHSABS 1.5 06/10/2018 1539   MONOABS 0.9 06/10/2018 1539   EOSABS 0.1 06/10/2018 1539   BASOSABS 0.1 06/10/2018 1539   CMP Latest Ref Rng & Units 06/10/2018 03/25/2018 01/14/2018  Glucose 70 - 99 mg/dL 86 141(H) 595(HH)  BUN 8 - 23 mg/dL _0 Creatinine 0.61 - 1.24 mg/dL 0.74 0.78 1.03  Sodium 135 - 145 mmol/L 139 140 133(L)  Potassium 3.5 - 5.1 mmol/L 4.3 4.2 4.8  Chloride 98 - 111 mmol/L 106 106 97(L)  CO2 22 - 32 mmol/L _1 Calcium 8.9 - 10.3 mg/dL 9.6 9.4 9.3  Total Protein 6.5 - 8.1 g/dL 6.7 6.6 6.4(L)  Total Bilirubin 0.3 - 1.2 mg/dL 0.8 0.7 0.7  Alkaline Phos 38 - 126 U/L 42 50 69  AST 15 - 41 U/L _2 ALT 0 - 44 U/L _3 DIAGNOSTIC IMAGING:  I have independently reviewed the scans and discussed with the patient.   I have reviewed Francene Finders, NP's note and agree with the documentation.  I personally performed a face-to-face visit, made revisions and my assessment and plan is as follows.    ASSESSMENT & PLAN:   Prostate cancer metastatic to intrathoracic lymph node ([place]) 1.  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 -Last Lupron injection was in December 2019 at Dr. Wilhemina Bonito office. -PSA has steadily increased last 2 times.  Today PSA 0.47.  We will continue Zytiga and prednisone at this time. -He will come back in 2 months for follow-up with  repeat blood count.  2.  Androgen deprivation therapy induced bone loss:  -He received zoledronic acid every 3 months for a year which completed on 10/29/2017. -We will plan to repeat bone density in the future. -She will continue calcium and vitamin D supplements.   3.  Fluid overload: He will continue Bumex 1 mg daily.  4.  Peripheral neuropathy: -He has grade 1 neuropathy in the  feet, feet feel cold all the time.  He is able to do all his activities.   5.  Family history: -Maternal uncle had prostate cancer and mother had stomach cancer. -We will consider mutation testing for BRCA 1 and 2, which will give [place] other options for targeted therapy.      Orders placed this encounter:  Orders Placed This Encounter  Procedures  . PSA  . CBC with Differential/Platelet  . Comprehensive metabolic panel      Derek Jack, MD [place] [phone]

## 2018-06-17 NOTE — Patient Instructions (Addendum)
[facility] at [facility] Discharge Instructions  Follow up in 2 months with labs   Thank you for choosing [facility] at [facility] to provide your oncology and hematology care.  To afford each patient quality time with our provider, please arrive at least 15 minutes before your scheduled appointment time.   If you have a lab appointment with the Cancer Center please come in thru the  Main Entrance and check in at the main information desk  You need to re-schedule your appointment should you arrive 10 or more minutes late.  We strive to give you quality time with our providers, and arriving late affects you and other patients whose appointments are after yours.  Also, if you no show three or more times for appointments you may be dismissed from the clinic at the providers discretion.     Again, thank you for choosing [facility].  Our hope is that these requests will decrease the amount of time that you wait before being seen by our physicians.       _____________________________________________________________  Should you have questions after your visit to [facility], please contact our office at [phone] between the hours of 8:00 a.m. and 4:30 p.m.  Voicemails left after 4:00 p.m. will not be returned until the following business day.  For prescription refill requests, have your pharmacy contact our office and allow 72 hours.    Cancer Center Support Programs:   > Cancer Support Group  2nd Tuesday of the month 1pm-2pm, Journey Room    

## 2018-06-29 ENCOUNTER — Other Ambulatory Visit (HOSPITAL_COMMUNITY): Payer: Self-pay | Admitting: Nurse Practitioner

## 2018-06-29 DIAGNOSIS — C61 Malignant neoplasm of prostate: Secondary | ICD-10-CM

## 2018-06-29 DIAGNOSIS — C771 Secondary and unspecified malignant neoplasm of intrathoracic lymph nodes: Principal | ICD-10-CM

## 2018-07-01 ENCOUNTER — Other Ambulatory Visit (HOSPITAL_COMMUNITY): Payer: Self-pay | Admitting: *Deleted

## 2018-07-15 ENCOUNTER — Telehealth (HOSPITAL_COMMUNITY): Payer: Self-pay

## 2018-07-15 NOTE — Telephone Encounter (Signed)
Hulda Humphrey family, [name], called stating the PCP took him off the Magnesium due to diarrhea at night.  She stated he was placed on the Magnesium by Dr. Delton Coombes and wanted to know if he should restart.    Tele. No.  [phone]

## 2018-07-18 ENCOUNTER — Telehealth (HOSPITAL_COMMUNITY): Payer: Self-pay | Admitting: *Deleted

## 2018-07-18 NOTE — Telephone Encounter (Signed)
Pt's wife aware that message about the pt's medication has been sent to Dr. Delton Coombes but that he is out of the office today and will be in tomorrow and should take care of things then. Pt's wife advised to call us back if she has not heard from by lunch time. Pt's wife verbalized understanding.

## 2018-07-19 ENCOUNTER — Telehealth (HOSPITAL_COMMUNITY): Payer: Self-pay | Admitting: Surgery

## 2018-07-19 NOTE — Telephone Encounter (Signed)
Spoke with Andre [name] and she advised the pt to start taking his magnesium every other day and to call our office back if he starts having diarrhea again.  Spoke with pt's daughter Andre [name] and she verbalized understanding.

## 2018-07-21 ENCOUNTER — Other Ambulatory Visit (HOSPITAL_COMMUNITY): Payer: Self-pay | Admitting: Hematology

## 2018-07-22 ENCOUNTER — Other Ambulatory Visit (HOSPITAL_COMMUNITY): Payer: Self-pay | Admitting: *Deleted

## 2018-07-22 MED ORDER — ABIRATERONE ACETATE 500 MG PO TABS: ORAL_TABLET | ORAL | 0 refills | Status: DC

## 2018-07-22 NOTE — Telephone Encounter (Signed)
Chart reviewed, per Dr. Tomie China last note, okay to refill Zytiga.

## 2018-08-12 ENCOUNTER — Inpatient Hospital Stay (HOSPITAL_COMMUNITY): Payer: Medicare PPO | Attending: Hematology

## 2018-08-12 ENCOUNTER — Other Ambulatory Visit: Payer: Self-pay

## 2018-08-12 DIAGNOSIS — G620 Drug-induced polyneuropathy: Secondary | ICD-10-CM | POA: Diagnosis not present

## 2018-08-12 DIAGNOSIS — Z87891 Personal history of nicotine dependence: Secondary | ICD-10-CM | POA: Diagnosis not present

## 2018-08-12 DIAGNOSIS — Z8042 Family history of malignant neoplasm of prostate: Secondary | ICD-10-CM | POA: Diagnosis not present

## 2018-08-12 DIAGNOSIS — C778 Secondary and unspecified malignant neoplasm of lymph nodes of multiple regions: Secondary | ICD-10-CM | POA: Diagnosis not present

## 2018-08-12 DIAGNOSIS — Z800 Family history of malignant neoplasm of digestive organs: Secondary | ICD-10-CM | POA: Insufficient documentation

## 2018-08-12 DIAGNOSIS — E291 Testicular hypofunction: Secondary | ICD-10-CM | POA: Insufficient documentation

## 2018-08-12 DIAGNOSIS — C61 Malignant neoplasm of prostate: Secondary | ICD-10-CM

## 2018-08-12 DIAGNOSIS — E8770 Fluid overload, unspecified: Secondary | ICD-10-CM | POA: Diagnosis not present

## 2018-08-12 DIAGNOSIS — C771 Secondary and unspecified malignant neoplasm of intrathoracic lymph nodes: Secondary | ICD-10-CM

## 2018-08-12 LAB — COMPREHENSIVE METABOLIC PANEL
ALT: 9 U/L (ref 0–44)
AST: 15 U/L (ref 15–41)
Albumin: 3.9 g/dL (ref 3.5–5.0)
Alkaline Phosphatase: 44 U/L (ref 38–126)
Anion gap: 8 (ref 5–15)
BUN: 16 mg/dL (ref 8–23)
CO2: 25 mmol/L (ref 22–32)
Calcium: 9.5 mg/dL (ref 8.9–10.3)
Chloride: 105 mmol/L (ref 98–111)
Creatinine, Ser: 0.8 mg/dL (ref 0.61–1.24)
Glucose, Bld: 92 mg/dL (ref 70–99)
Potassium: 3.9 mmol/L (ref 3.5–5.1)
Sodium: 138 mmol/L (ref 135–145)
Total Bilirubin: 0.8 mg/dL (ref 0.3–1.2)
Total Protein: 6.4 g/dL — ABNORMAL LOW (ref 6.5–8.1)

## 2018-08-12 LAB — CBC WITH DIFFERENTIAL/PLATELET
Abs Immature Granulocytes: 0.05 10*3/uL (ref 0.00–0.07)
Basophils Absolute: 0.1 10*3/uL (ref 0.0–0.1)
Basophils Relative: 1 %
Eosinophils Absolute: 0.1 10*3/uL (ref 0.0–0.5)
Eosinophils Relative: 1 %
HCT: 37.6 % — ABNORMAL LOW (ref 39.0–52.0)
Hemoglobin: 11.9 g/dL — ABNORMAL LOW (ref 13.0–17.0)
Immature Granulocytes: 1 %
Lymphocytes Relative: 15 %
Lymphs Abs: 1.3 10*3/uL (ref 0.7–4.0)
MCH: 27.6 pg (ref 26.0–34.0)
MCHC: 31.6 g/dL (ref 30.0–36.0)
MCV: 87.2 fL (ref 80.0–100.0)
Monocytes Absolute: 0.9 10*3/uL (ref 0.1–1.0)
Monocytes Relative: 11 %
NEUTROS ABS: 5.9 10*3/uL (ref 1.7–7.7)
Neutrophils Relative %: 71 %
Platelets: 288 10*3/uL (ref 150–400)
RBC: 4.31 MIL/uL (ref 4.22–5.81)
RDW: 14.6 % (ref 11.5–15.5)
WBC: 8.2 10*3/uL (ref 4.0–10.5)
nRBC: 0 % (ref 0.0–0.2)

## 2018-08-12 LAB — PSA: PROSTATIC SPECIFIC ANTIGEN: 0.57 ng/mL (ref 0.00–4.00)

## 2018-08-18 ENCOUNTER — Other Ambulatory Visit: Payer: Self-pay

## 2018-08-19 ENCOUNTER — Encounter (HOSPITAL_COMMUNITY): Payer: Self-pay | Admitting: Hematology

## 2018-08-19 ENCOUNTER — Inpatient Hospital Stay (HOSPITAL_COMMUNITY): Payer: Medicare PPO | Admitting: Hematology

## 2018-08-19 DIAGNOSIS — C778 Secondary and unspecified malignant neoplasm of lymph nodes of multiple regions: Secondary | ICD-10-CM | POA: Diagnosis not present

## 2018-08-19 DIAGNOSIS — Z87891 Personal history of nicotine dependence: Secondary | ICD-10-CM

## 2018-08-19 DIAGNOSIS — Z8042 Family history of malignant neoplasm of prostate: Secondary | ICD-10-CM

## 2018-08-19 DIAGNOSIS — E8770 Fluid overload, unspecified: Secondary | ICD-10-CM

## 2018-08-19 DIAGNOSIS — C61 Malignant neoplasm of prostate: Secondary | ICD-10-CM | POA: Diagnosis not present

## 2018-08-19 DIAGNOSIS — G620 Drug-induced polyneuropathy: Secondary | ICD-10-CM

## 2018-08-19 DIAGNOSIS — E291 Testicular hypofunction: Secondary | ICD-10-CM

## 2018-08-19 DIAGNOSIS — C771 Secondary and unspecified malignant neoplasm of intrathoracic lymph nodes: Principal | ICD-10-CM

## 2018-08-19 DIAGNOSIS — Z800 Family history of malignant neoplasm of digestive organs: Secondary | ICD-10-CM

## 2018-08-19 MED ORDER — BUMETANIDE 1 MG PO TABS: 1.0000 mg | ORAL_TABLET | Freq: Every day | ORAL | 6 refills | Status: DC

## 2018-08-19 NOTE — Patient Instructions (Addendum)
[place] at [facility] Discharge Instructions  You were seen today by Dr. Delton Coombes. He went over your recent lab results. Your PSA has gone up slightly, we can continue to monitor for 2 more months and if the numbers get closer to 2 we will change his medication. He will get a bone scan and CT scan in 8 weeks. You can start taking vitamin C.  Continue taking the magnesium every other day. He will see you back in 8 weeks for labs and follow up.   Thank you for choosing [place] at [facility] to provide your oncology and hematology care.  To afford each patient quality time with our provider, please arrive at least 15 minutes before your scheduled appointment time.   If you have a lab appointment with the [place] please come in thru the  Main Entrance and check in at the main information desk  You need to re-schedule your appointment should you arrive 10 or more minutes late.  We strive to give you quality time with our providers, and arriving late affects you and other patients whose appointments are after yours.  Also, if you no show three or more times for appointments you may be dismissed from the clinic at the providers discretion.     Again, thank you for choosing [facility].  Our hope is that these requests will decrease the amount of time that you wait before being seen by our physicians.       _____________________________________________________________  Should you have questions after your visit to [facility], please contact our office at [phone] between the hours of 8:00 a.m. and 4:30 p.m.  Voicemails left after 4:00 p.m. will not be returned until the following business day.  For prescription refill requests, have your pharmacy contact our office and allow 72 hours.    Cancer Center Support Programs:   > Cancer Support Group  2nd Tuesday of the month 1pm-2pm, Journey Room

## 2018-08-19 NOTE — Assessment & Plan Note (Signed)
1.  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 -Last Lupron injection was in December 2019 at Dr. Gala Lewandowsky office. -PSA has increased since November 2019.  Today his PSA is 0.57. -I discussed the options of discontinuing Abiraterone at this time.  The other option is to continue for another couple of months and repeat scans prior to next visit in 8 weeks.  Patient is agreeable to the latter option. - We will order CT CAP and bone scan prior to next visit along with a PSA level.  I plan to change him to enzalutamide at next visit. -He is currently taking magnesium every other day.  He reportedly had diarrhea for few weeks after his metformin was increased to 2 tablets in the morning and 1 tablet in the evening.  He no longer has diarrhea.  We will check a magnesium level at next visit.  2.  Androgen deprivation therapy induced bone loss:  -He received zoledronic acid every 3 months for a year which completed on 10/29/2017. -We will plan to repeat bone density in the future. -He will continue calcium and vitamin D supplements.    3.  Fluid overload: -He will continue Bumex 1 mg daily.  I have sent a refill.    4.  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  He is able to do all his activities.  5.  Family history: -Maternal uncle had prostate cancer and mother had stomach cancer. -We will make a referral to [facility] for BRCA1/2 testing.  If he does have germline mutations, he will have more options for targeted therapy. Marland Kitchen

## 2018-08-19 NOTE — Progress Notes (Signed)
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   INTERVAL HISTORY:  Andre [name] 78 y.o. male returns for routine follow-up. He is here alone, daughter via phone. He states that he had diarrhea for a little while but denies any now. He states that he is doing fine. Denies any nausea, vomiting, or diarrhea. Denies any new pains. Had not noticed any recent bleeding such as epistaxis, hematuria or hematochezia. Denies recent chest pain on exertion, shortness of breath on minimal exertion, pre-syncopal episodes, or palpitations. Denies any numbness or tingling in hands or feet. Denies any recent fevers, infections, or recent hospitalizations. Patient reports appetite at 100% and energy level at 100%.   REVIEW OF SYSTEMS:  Review of Systems  Neurological: Positive for numbness.  All other systems reviewed and are negative.    PAST MEDICAL/SURGICAL HISTORY:  Past Medical History:  Diagnosis Date  . Cancer [facility])    prostate  . COPD (chronic obstructive pulmonary disease) ([place])   . Hypertension    Past Surgical  History:  Procedure Laterality Date  . GOLD SEED IMPLANT       SOCIAL HISTORY:  Social History   Socioeconomic History  . Marital status: Widowed    Spouse name: Not on file  . Number of children: Not on file  . Years of education: Not on file  . Highest education level: Not on file  Occupational History  . Not on file  Social Needs  . Financial resource strain: Not on file  . Food insecurity:    Worry: Not on file    Inability: Not on file  . Transportation needs:    Medical: Not on file    Non-medical: Not on file  Tobacco Use  . Smoking status: Former Smoker    Types: Cigarettes    Last attempt to quit: 06/25/2014    Years since quitting: 4.1  . Smokeless tobacco: Never Used  . Tobacco comment: smoked since young age, quit in 2016  Substance and Sexual Activity  . Alcohol use: Not Currently  . Drug use: No  . Sexual activity: Not on file  Lifestyle  . Physical activity:    Days per week: Not on file    Minutes per session: Not on file  . Stress: Not on file  Relationships  . Social connections:    Talks on phone: Not on file    Gets together: Not on file    Attends religious service: Not on file    Active member of club or organization: Not on file    Attends meetings of clubs or organizations: Not on  file    Relationship status: Not on file  . Intimate partner violence:    Fear of current or ex partner: Not on file    Emotionally abused: Not on file    Physically abused: Not on file    Forced sexual activity: Not on file  Other Topics Concern  . Not on file  Social History Narrative  . Not on file    FAMILY HISTORY:  Family History  Problem Relation Age of Onset  . Cancer Mother   . Heart attack Father     CURRENT MEDICATIONS:  Outpatient Encounter Medications as of 08/19/2018  Medication Sig  . abiraterone acetate (ZYTIGA) 500 MG tablet Take 2 tablets by mouth once daily. Take 1 hr before or 2 hrs after a meal.  . acetaminophen (TYLENOL) 500 MG  tablet Take 500 mg by mouth every 6 (six) hours as needed for moderate pain.  Marland Kitchen aspirin EC 81 MG tablet Take 81 mg by mouth daily.  Marland Kitchen atorvastatin (LIPITOR) 10 MG tablet Take 10 mg by mouth at bedtime.  . brimonidine (ALPHAGAN) 0.2 % ophthalmic solution Place 1 drop into both eyes 3 (three) times daily.  . bumetanide (BUMEX) 1 MG tablet Take 1 tablet (1 mg total) by mouth daily.  Marland Kitchen CALCIUM PO Take 600 mg by mouth daily.   . cetirizine (ZYRTEC) 10 MG tablet Take 10 mg by mouth daily.  Marland Kitchen CHERATUSSIN AC 100-10 MG/5ML syrup Take 5 mLs by mouth daily as needed.   . Cholecalciferol (VITAMIN D-3 PO) Take 1 tablet by mouth daily.  . cyclobenzaprine (FLEXERIL) 10 MG tablet Take 10 mg by mouth as needed.   . dicyclomine (BENTYL) 10 MG capsule Take 10 mg by mouth daily.  . fluticasone (FLONASE) 50 MCG/ACT nasal spray USE 2 SPRAY(S) IN EACH NOSTRIL ONCE DAILY FOR 30 DAYS  . ibuprofen (ADVIL,MOTRIN) 600 MG tablet Take 600 mg by mouth every 6 (six) hours as needed.  . latanoprost (XALATAN) 0.005 % ophthalmic solution Place 1 drop into both eyes at bedtime.  Marland Kitchen losartan (COZAAR) 100 MG tablet Take 100 mg by mouth daily.  . metFORMIN (GLUCOPHAGE) 500 MG tablet TAKE 2 TABLETS BY MOUTH ONCE DAILY WITH A MEAL IN THE EVENING  . potassium chloride SA (K-DUR,KLOR-CON) 20 MEQ tablet Take 2 tablets (40 mEq total) by mouth daily.  . predniSONE (DELTASONE) 5 MG tablet Take 1 tablet (5 mg total) by mouth daily with breakfast.  . prochlorperazine (COMPAZINE) 10 MG tablet Take 10 mg by mouth every 6 (six) hours as needed for nausea or vomiting.  . Tiotropium Bromide Monohydrate (SPIRIVA RESPIMAT) 1.25 MCG/ACT AERS   . [DISCONTINUED] bumetanide (BUMEX) 1 MG tablet TAKE 1 TABLET BY MOUTH ONCE DAILY  . DOCEtaxel (TAXOTERE IV) Taxotere  . dorzolamide (TRUSOPT) 2 % ophthalmic solution Place 1 drop into both eyes 3 (three) times daily.  . magnesium oxide (MAG-OX) 400 MG tablet Take 1 tablet (400 mg total) by mouth 3 (three)  times daily. (Patient taking differently: Take 400 mg by mouth every other day. )  . nilutamide (NILANDRON) 150 MG tablet   . [DISCONTINUED] ibuprofen (ADVIL,MOTRIN) 200 MG tablet Take 200 mg by mouth every 6 (six) hours as needed for moderate pain.   No facility-administered encounter medications on file as of 08/19/2018.     ALLERGIES:  Allergies  Allergen Reactions  . Metoprolol      PHYSICAL EXAM:  ECOG Performance status: 1  Vitals:   08/19/18 1047  BP: (!) 154/77  Pulse: 95  Resp: 16  Temp: (!) 97.1 F (36.2 C)  SpO2: 97%   Filed Weights   08/19/18 1047  Weight: 217 lb 4.8 oz (98.6 kg)    Physical Exam Constitutional:      Appearance: Normal appearance.  Cardiovascular:     Rate and Rhythm: Normal rate and regular rhythm.     Heart sounds: Normal heart sounds.  Pulmonary:     Effort: Pulmonary effort is normal.     Breath sounds: Normal breath sounds.  Abdominal:     Palpations: Abdomen is soft. There is no mass.  Musculoskeletal:     Right lower leg: Edema present.     Left lower leg: Edema present.  Skin:    General: Skin is warm.  Neurological:     General: No focal deficit present.     Mental Status: He is alert and oriented to person, place, and time.  Psychiatric:        Mood and Affect: Mood normal.        Behavior: Behavior normal.      LABORATORY DATA:  I have reviewed the labs as listed.  CBC    Component Value Date/Time   WBC 8.2 08/12/2018 1515   RBC 4.31 08/12/2018 1515   HGB 11.9 (L) 08/12/2018 1515   HCT 37.6 (L) 08/12/2018 1515   PLT 288 08/12/2018 1515   MCV 87.2 08/12/2018 1515   MCH 27.6 08/12/2018 1515   MCHC 31.6 08/12/2018 1515   RDW 14.6 08/12/2018 1515   LYMPHSABS 1.3 08/12/2018 1515   MONOABS 0.9 08/12/2018 1515   EOSABS 0.1 08/12/2018 1515   BASOSABS 0.1 08/12/2018 1515   CMP Latest Ref Rng & Units 08/12/2018 06/10/2018 03/25/2018  Glucose 70 - 99 mg/dL 92 86 141(H)  BUN 8 - 23 mg/dL 16 15 12   Creatinine 0.61 -  1.24 mg/dL 0.80 0.74 0.78  Sodium 135 - 145 mmol/L 138 139 140  Potassium 3.5 - 5.1 mmol/L 3.9 4.3 4.2  Chloride 98 - 111 mmol/L 105 106 106  CO2 22 - 32 mmol/L 25 28 27   Calcium 8.9 - 10.3 mg/dL 9.5 9.6 9.4  Total Protein 6.5 - 8.1 g/dL 6.4(L) 6.7 6.6  Total Bilirubin 0.3 - 1.2 mg/dL 0.8 0.8 0.7  Alkaline Phos 38 - 126 U/L 44 42 50  AST 15 - 41 U/L 15 16 15   ALT 0 - 44 U/L 9 9 9        DIAGNOSTIC IMAGING:  I have independently reviewed the scans and discussed with the patient.   I have reviewed Venita Lick LPN's note and agree with the documentation.  I personally performed a face-to-face visit, made revisions and my assessment and plan is as follows.    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 -Last Lupron injection was in December 2019 at Dr. Gala Lewandowsky office. -PSA has increased since November 2019.  Today his PSA is 0.57. -I discussed the options of discontinuing Abiraterone at this time.  The other option is to continue for another couple of months and repeat scans prior to next visit in 8 weeks.  Patient is agreeable to the latter option. - We will order CT CAP and bone scan prior to next  visit along with a PSA level.  I plan to change him to enzalutamide at next visit. -He is currently taking magnesium every other day.  He reportedly had diarrhea for few weeks after his metformin was increased to 2 tablets in the morning and 1 tablet in the evening.  He no longer has diarrhea.  We will check a magnesium level at next visit.  2.  Androgen deprivation therapy induced bone loss:  -He received zoledronic acid every 3 months for a year which completed on 10/29/2017. -We will  plan to repeat bone density in the future. -He will continue calcium and vitamin D supplements.    3.  Fluid overload: -He will continue Bumex 1 mg daily.  I have sent a refill.    4.  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  He is able to do all his activities.  5.  Family history: -Maternal uncle had prostate cancer and mother had stomach cancer. -We will make a referral to [facility] for BRCA1/2 testing.  If he does have germline mutations, he will have more options for targeted therapy. .      Orders placed this encounter:  Orders Placed This Encounter  Procedures  . NM Bone Scan Whole Body  . CT Chest W Contrast  . CT Abdomen Pelvis W Contrast  . CBC with Differential/Platelet  . Comprehensive metabolic panel  . Magnesium  . PSA      Derek Jack, MD [place] [phone]

## 2018-08-20 ENCOUNTER — Other Ambulatory Visit (HOSPITAL_COMMUNITY): Payer: Self-pay | Admitting: Hematology

## 2018-08-23 ENCOUNTER — Other Ambulatory Visit (HOSPITAL_COMMUNITY): Payer: Self-pay | Admitting: *Deleted

## 2018-09-16 ENCOUNTER — Inpatient Hospital Stay (HOSPITAL_COMMUNITY): Payer: Medicare PPO | Attending: Hematology

## 2018-09-16 ENCOUNTER — Other Ambulatory Visit: Payer: Self-pay

## 2018-09-16 ENCOUNTER — Encounter (HOSPITAL_COMMUNITY): Payer: Medicare PPO

## 2018-09-16 DIAGNOSIS — Z452 Encounter for adjustment and management of vascular access device: Secondary | ICD-10-CM | POA: Insufficient documentation

## 2018-09-16 DIAGNOSIS — C778 Secondary and unspecified malignant neoplasm of lymph nodes of multiple regions: Secondary | ICD-10-CM | POA: Insufficient documentation

## 2018-09-16 DIAGNOSIS — C61 Malignant neoplasm of prostate: Secondary | ICD-10-CM | POA: Diagnosis not present

## 2018-09-16 MED ORDER — SODIUM CHLORIDE 0.9% FLUSH
10.0000 mL | Freq: Once | INTRAVENOUS | Status: AC
  Administered 2018-09-16: 10 mL via INTRAVENOUS

## 2018-09-16 MED ORDER — HEPARIN SOD (PORK) LOCK FLUSH 100 UNIT/ML IV SOLN
500.0000 [IU] | Freq: Once | INTRAVENOUS | Status: AC
  Administered 2018-09-16: 500 [IU] via INTRAVENOUS

## 2018-09-16 NOTE — Patient Instructions (Signed)
[facility]  Discharge Instructions:   _______________________________________________________________  Thank you for choosing [facility] at [facility] to provide your oncology and hematology care.  To afford each patient quality time with our providers, please arrive at least 15 minutes before your scheduled appointment.  You need to re-schedule your appointment if you arrive 10 or more minutes late.  We strive to give you quality time with our providers, and arriving late affects you and other patients whose appointments are after yours.  Also, if you no show three or more times for appointments you may be dismissed from the clinic.  Again, thank you for choosing [facility]. Our hope is that these requests will allow you access to exceptional care and in a timely manner. _______________________________________________________________  If you have questions after your visit, please contact our office at [phone] between the hours of 8:30 a.m. and 5:00 p.m. Voicemails left after 4:30 p.m. will not be returned until the following business day. _______________________________________________________________  For prescription refill requests, have your pharmacy contact our office. _______________________________________________________________  Recommendations made by the consultant and any test results will be sent to your referring physician. _______________________________________________________________ 

## 2018-09-16 NOTE — Progress Notes (Signed)
Pt presents today for port flush. VSS. No complaints of any changes since the last visit.   Andre [name] presented for Portacath access and flush. Portacath located in the right chest wall accessed with  H 20 needle. Clean, Dry and Intact Good blood return present. Portacath flushed with 10 ml NS and 500U/23ml Heparin per protocol and needle removed intact. Procedure without incident. Patient tolerated procedure well.

## 2018-10-06 ENCOUNTER — Encounter (HOSPITAL_COMMUNITY): Payer: Self-pay | Admitting: Genetic Counselor

## 2018-10-06 ENCOUNTER — Inpatient Hospital Stay (HOSPITAL_COMMUNITY): Payer: Medicare PPO | Attending: Hematology | Admitting: Genetic Counselor

## 2018-10-06 ENCOUNTER — Other Ambulatory Visit: Payer: Self-pay

## 2018-10-06 ENCOUNTER — Inpatient Hospital Stay (HOSPITAL_COMMUNITY): Payer: Medicare PPO

## 2018-10-06 DIAGNOSIS — E8770 Fluid overload, unspecified: Secondary | ICD-10-CM | POA: Diagnosis not present

## 2018-10-06 DIAGNOSIS — C778 Secondary and unspecified malignant neoplasm of lymph nodes of multiple regions: Secondary | ICD-10-CM | POA: Diagnosis not present

## 2018-10-06 DIAGNOSIS — C61 Malignant neoplasm of prostate: Secondary | ICD-10-CM | POA: Insufficient documentation

## 2018-10-06 DIAGNOSIS — C771 Secondary and unspecified malignant neoplasm of intrathoracic lymph nodes: Secondary | ICD-10-CM

## 2018-10-06 DIAGNOSIS — Z8042 Family history of malignant neoplasm of prostate: Secondary | ICD-10-CM

## 2018-10-06 DIAGNOSIS — Z87891 Personal history of nicotine dependence: Secondary | ICD-10-CM | POA: Diagnosis not present

## 2018-10-06 DIAGNOSIS — Z8041 Family history of malignant neoplasm of ovary: Secondary | ICD-10-CM

## 2018-10-06 LAB — COMPREHENSIVE METABOLIC PANEL
ALT: 8 U/L (ref 0–44)
AST: 13 U/L — ABNORMAL LOW (ref 15–41)
Albumin: 4 g/dL (ref 3.5–5.0)
Alkaline Phosphatase: 44 U/L (ref 38–126)
Anion gap: 9 (ref 5–15)
BUN: 16 mg/dL (ref 8–23)
CO2: 28 mmol/L (ref 22–32)
Calcium: 9.6 mg/dL (ref 8.9–10.3)
Chloride: 104 mmol/L (ref 98–111)
Creatinine, Ser: 0.81 mg/dL (ref 0.61–1.24)
GFR calc Af Amer: >60 mL/min (ref >60)
GFR calc non Af Amer: >60 mL/min (ref >60)
Glucose, Bld: 134 mg/dL — ABNORMAL HIGH (ref 70–99)
Potassium: 4.1 mmol/L (ref 3.5–5.1)
Sodium: 141 mmol/L (ref 135–145)
Total Bilirubin: 0.8 mg/dL (ref 0.3–1.2)
Total Protein: 6.8 g/dL (ref 6.5–8.1)

## 2018-10-06 LAB — CBC WITH DIFFERENTIAL/PLATELET
Abs Immature Granulocytes: 0.03 10*3/uL (ref 0.00–0.07)
Basophils Absolute: 0.1 10*3/uL (ref 0.0–0.1)
Basophils Relative: 1 %
Eosinophils Absolute: 0.1 10*3/uL (ref 0.0–0.5)
Eosinophils Relative: 1 %
HCT: 38.1 % — ABNORMAL LOW (ref 39.0–52.0)
Hemoglobin: 12 g/dL — ABNORMAL LOW (ref 13.0–17.0)
Immature Granulocytes: 0 %
Lymphocytes Relative: 11 %
Lymphs Abs: 1.1 10*3/uL (ref 0.7–4.0)
MCH: 28 pg (ref 26.0–34.0)
MCHC: 31.5 g/dL (ref 30.0–36.0)
MCV: 89 fL (ref 80.0–100.0)
Monocytes Absolute: 0.9 10*3/uL (ref 0.1–1.0)
Monocytes Relative: 9 %
Neutro Abs: 7.6 10*3/uL (ref 1.7–7.7)
Neutrophils Relative %: 78 %
Platelets: 288 10*3/uL (ref 150–400)
RBC: 4.28 MIL/uL (ref 4.22–5.81)
RDW: 14.4 % (ref 11.5–15.5)
WBC: 9.8 10*3/uL (ref 4.0–10.5)
nRBC: 0 % (ref 0.0–0.2)

## 2018-10-06 LAB — PSA: Prostatic Specific Antigen: 0.61 ng/mL (ref 0.00–4.00)

## 2018-10-06 LAB — MAGNESIUM: Magnesium: 1.9 mg/dL (ref 1.7–2.4)

## 2018-10-06 NOTE — Progress Notes (Signed)
REFERRING PROVIDER: Derek Jack, MD [address] [place], [place] [address]  PRIMARY PROVIDER:  Joseph Art, MD  PRIMARY REASON FOR VISIT:  1. 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 Family history of ovarian cancer   3. Family history of prostate cancer      HISTORY OF PRESENT ILLNESS:  I connected with Andre [name] on 10/06/2018 at 10:40 AM EDT by Jackquline Denmark video conference and verified that I am speaking with the correct person using two identifiers.   Patient location: [place] Provider location: Office  Andre [name], a 78 y.o. male, was seen for a [place] cancer genetics consultation at the request of Dr. Delton Coombes due to a personal and family history of cancer.  Andre [name] presents to clinic today to discuss the possibility of a hereditary predisposition to cancer, genetic testing, and to further clarify his future cancer risks, as well as potential cancer risks for family members.   In 2010, at the age of 72, Andre [name] was diagnosed with cancer of the prostate. The cancer is now metastatic.     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 Cancer [facility])    prostate  . COPD (chronic obstructive pulmonary disease) ([place])   . Family history of ovarian cancer   . Family history of prostate cancer   . Hypertension     Past Surgical History:  Procedure Laterality Date  . GOLD SEED IMPLANT      Social History   Socioeconomic History  . Marital status: Widowed     Spouse name: Not on file  . Number of children: Not on file  . Years of education: Not on file  . Highest education level: Not on file  Occupational History  . Not on file  Social Needs  . Financial resource strain: Not on file  . Food insecurity:    Worry: Not on file    Inability: Not on file  . Transportation needs:    Medical: Not on file    Non-medical: Not on file  Tobacco Use  . Smoking status: Former Smoker    Types: Cigarettes    Last attempt to quit: 06/25/2014    Years since quitting: 4.2  . Smokeless tobacco: Never Used  . Tobacco comment: smoked since young age, quit in 2016  Substance and Sexual Activity  . Alcohol use: Not Currently  . Drug use: No  . Sexual activity: Not on file  Lifestyle  . Physical activity:    Days per week: Not on file    Minutes per session: Not on file  . Stress: Not on file  Relationships  . Social connections:    Talks on phone: Not on file    Gets together: Not on file    Attends religious service: Not on file    Active member of club or organization: Not on file    Attends meetings of clubs or organizations: Not on file  Relationship status: Not on file  Other Topics Concern  . Not on file  Social History Narrative  . Not on file     FAMILY HISTORY:  We obtained a detailed, 4-generation family history.  Significant diagnoses are listed below: Family History  Problem Relation Age of Onset  . Ovarian cancer Mother 66  . Heart attack Father   . Prostate cancer Maternal Uncle 12  . Cancer Cousin        pat cousin with unknown cancer    The patient has a daughter who is cancer free.  He has two sisters and three brothers who are all cancer free.  Both parents are deceased.  The patient's mother had ovarian cancer and died at 4. She had three sisters and two brothers.  One brother had prostate cancer.  The maternal grandparents are deceased. The patient does not know their cause of deaths.  The patient's father died  of black lung disease.  He had eight brothers and six sisters.  None had cancer, although a brother had a daughter with an unknown form of cancer.  The paternal grandparents died of unknown causes.   Andre [name] is unaware of previous family history of genetic testing for hereditary cancer risks. Patient's maternal ancestors are of African American descent, and paternal ancestors are of African American descent. There is no reported Ashkenazi Jewish ancestry. There is no known consanguinity.   GENETIC COUNSELING ASSESSMENT: Andre [name] is a 78 y.o. male with a personal and family history of cancer which is somewhat suggestive of a hereditary cancer syndrome and predisposition to cancer. We, therefore, discussed and recommended the following at today's visit.   DISCUSSION: We discussed that 15 - 20% of prostate cancer is hereditary, with most cases associated with BRCA mutations.  There are other genes that can be associated with hereditary prostate cancer syndromes.  We discussed that testing is beneficial for several reasons including knowing how to follow individuals after completing their treatment, identifying whether potential treatment options such as PARP inhibitors would be beneficial, and understand if other family members could be at risk for cancer and allow them to undergo genetic testing.   We reviewed the characteristics, features and inheritance patterns of hereditary cancer syndromes. We also discussed genetic testing, including the appropriate family members to test, the process of testing, insurance coverage and turn-around-time for results. We discussed the implications of a negative, positive and/or variant of uncertain significant result. We recommended Andre [name] pursue genetic testing for the common hereditary cancer gene panel. The Common Hereditary Gene Panel offered by [facility] includes sequencing and/or deletion duplication testing of the following 48 genes: APC, ATM, AXIN2, BARD1,  BMPR1A, BRCA1, BRCA2, BRIP1, CDH1, CDK4, CDKN2A (p14ARF), CDKN2A (p16INK4a), CHEK2, CTNNA1, DICER1, EPCAM (Deletion/duplication testing only), GREM1 (promoter region deletion/duplication testing only), KIT, MEN1, MLH1, MSH2, MSH3, MSH6, MUTYH, NBN, NF1, NHTL1, PALB2, PDGFRA, PMS2, POLD1, POLE, PTEN, RAD50, RAD51C, RAD51D, RNF43, SDHB, SDHC, SDHD, SMAD4, SMARCA4. STK11, TP53, TSC1, TSC2, and VHL.  The following genes were evaluated for sequence changes only: SDHA and HOXB13 c.251G>A variant only.   Based on Andre [name]'s personal and family history of cancer, he meets medical criteria for genetic testing. Despite that he meets criteria, he may still have an out of pocket cost. We discussed that if his out of pocket cost for testing is over $100, the laboratory will call and confirm whether he wants to proceed with testing.  If the out of pocket cost of testing is less  than $100 he will be billed by the genetic testing laboratory.   PLAN: After considering the risks, benefits, and limitations, Andre [name] provided informed consent to pursue genetic testing and the blood sample was sent to [facility] for analysis of the common hereditary cancer panel. Results should be available within approximately 2-3 weeks' time, at which point they will be disclosed by telephone to Andre [name], as will any additional recommendations warranted by these results. Andre [name] will receive a summary of his genetic counseling visit and a copy of his results once available. This information will also be available in Epic.   Lastly, we encouraged Andre [name] to remain in contact with cancer genetics annually so that we can continuously update the family history and inform him of any changes in cancer genetics and testing that may be of benefit for this family.   Andre [name] questions were answered to his satisfaction today. Our contact information was provided should additional questions or concerns arise. Thank you for the  referral and allowing [place] to share in the care of your patient.   Ginnette Gates P. Fl[place]ne [place], [place], [facility] Certified Genetic Counselor [email or web address] phone: [phone]  The patient was seen for a total of 60 minutes in face-to-face genetic counseling.  This patient was discussed with Drs. Magrinat, Lindi Adie and/or Burr Medico who agrees with the above.    _______________________________________________________________________ For Office Staff:  Number of people involved in session: 2 Was an Intern/ student involved with case: no

## 2018-10-13 ENCOUNTER — Other Ambulatory Visit (HOSPITAL_COMMUNITY): Payer: Self-pay | Admitting: Hematology

## 2018-10-13 DIAGNOSIS — C61 Malignant neoplasm of prostate: Secondary | ICD-10-CM

## 2018-10-13 DIAGNOSIS — C771 Secondary and unspecified malignant neoplasm of intrathoracic lymph nodes: Secondary | ICD-10-CM

## 2018-10-14 ENCOUNTER — Encounter (HOSPITAL_COMMUNITY)
Admission: RE | Admit: 2018-10-14 | Discharge: 2018-10-14 | Disposition: A | Discharge status: Home / Self Care | Payer: Medicare PPO | Source: Ambulatory Visit | Attending: Hematology | Admitting: Hematology

## 2018-10-14 ENCOUNTER — Other Ambulatory Visit: Payer: Self-pay

## 2018-10-14 ENCOUNTER — Other Ambulatory Visit (HOSPITAL_COMMUNITY): Payer: Self-pay | Admitting: *Deleted

## 2018-10-14 ENCOUNTER — Ambulatory Visit (HOSPITAL_COMMUNITY)
Admission: RE | Admit: 2018-10-14 | Discharge: 2018-10-14 | Disposition: A | Discharge status: Home / Self Care | Payer: Medicare PPO | Source: Ambulatory Visit | Attending: Hematology | Admitting: Hematology

## 2018-10-14 ENCOUNTER — Encounter (HOSPITAL_COMMUNITY): Payer: Self-pay

## 2018-10-14 DIAGNOSIS — C61 Malignant neoplasm of prostate: Secondary | ICD-10-CM

## 2018-10-14 DIAGNOSIS — C771 Secondary and unspecified malignant neoplasm of intrathoracic lymph nodes: Secondary | ICD-10-CM | POA: Insufficient documentation

## 2018-10-14 HISTORY — DX: Type 2 diabetes mellitus without complications: E11.9

## 2018-10-14 MED ORDER — IOHEXOL 300 MG/ML  SOLN
100.0000 mL | Freq: Once | INTRAMUSCULAR | Status: AC | PRN
  Administered 2018-10-14: 10:00:00 100 mL via INTRAVENOUS

## 2018-10-14 MED ORDER — TECHNETIUM TC 99M MEDRONATE IV KIT
20.0000 | PACK | Freq: Once | INTRAVENOUS | Status: AC | PRN
  Administered 2018-10-14: 21 via INTRAVENOUS

## 2018-10-14 MED ORDER — PREDNISONE 5 MG PO TABS: 5.0000 mg | ORAL_TABLET | Freq: Every day | ORAL | 1 refills | Status: DC

## 2018-10-18 ENCOUNTER — Ambulatory Visit: Payer: Self-pay | Admitting: Genetic Counselor

## 2018-10-18 ENCOUNTER — Encounter: Payer: Self-pay | Admitting: Genetic Counselor

## 2018-10-18 ENCOUNTER — Telehealth: Payer: Self-pay | Admitting: Genetic Counselor

## 2018-10-18 DIAGNOSIS — Z1379 Encounter for other screening for genetic and chromosomal anomalies: Secondary | ICD-10-CM

## 2018-10-18 NOTE — Progress Notes (Signed)
HPI:  Mr. Robling was previously seen in the [place] clinic due to a personal and family history of cancer and concerns regarding a hereditary predisposition to cancer. Please refer to our prior cancer genetics clinic note for more information regarding our discussion, assessment and recommendations, at the time. Mr. Flanagin recent genetic test results were disclosed to him, as were recommendations warranted by these results. These results and recommendations are discussed in more detail below.  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  The Common Hereditary Gene Panel offered by [facility] includes sequencing and/or deletion duplication testing of the following 48 genes: APC, ATM, AXIN2, BARD1, BMPR1A, BRCA1, BRCA2, BRIP1, CDH1, CDK4, CDKN2A (p14ARF), CDKN2A (p16INK4a), CHEK2, CTNNA1, DICER1, EPCAM (Deletion/duplication testing only), GREM1 (promoter region deletion/duplication testing only), KIT, MEN1, MLH1, MSH2, MSH3, MSH6, MUTYH, NBN, NF1, NHTL1, PALB2, PDGFRA, PMS2, POLD1, POLE, PTEN, RAD50, RAD51C, RAD51D, RNF43, SDHB, SDHC, SDHD, SMAD4, SMARCA4. STK11, TP53, TSC1, TSC2, and VHL.  The following genes were evaluated for sequence changes only: SDHA and HOXB13 c.251G>A variant only. The report date is Oct 14, 2018.     FAMILY HISTORY:  We obtained a detailed, 4-generation family history.  Significant diagnoses are listed below: Family History   Problem Relation Age of Onset  . Ovarian cancer Mother 21  . Heart attack Father   . Prostate cancer Maternal Uncle 81  . Cancer Cousin        pat cousin with unknown cancer    The patient has a daughter who is cancer free.  He has two sisters and three brothers who are all cancer free.  Both parents are deceased.  The patient's mother had ovarian cancer and died at 66. She had three sisters and two brothers.  One brother had prostate cancer.  The maternal grandparents are deceased. The patient does not know their cause of deaths.  The patient's father died of black lung disease.  He had eight brothers and six sisters.  None had cancer, although a brother had a daughter with an unknown form of cancer.  The paternal grandparents died of unknown causes.   Mr. Romanoski is unaware of previous family history of genetic testing for hereditary cancer risks. Patient's maternal ancestors are of African American descent, and paternal ancestors are of African American descent. There is no reported Ashkenazi Jewish ancestry. There is no known consanguinity.   GENETIC TEST RESULTS: Genetic testing reported out on Oct 14, 2018 through the common hereditary cancer panel found no pathogenic mutations. The Common Hereditary Gene Panel offered by [facility] includes sequencing and/or deletion duplication testing of the following 48 genes: APC, ATM, AXIN2, BARD1, BMPR1A, BRCA1, BRCA2, BRIP1, CDH1, CDK4, CDKN2A (p14ARF), CDKN2A (p16INK4a), CHEK2, CTNNA1, DICER1, EPCAM (Deletion/duplication testing only), GREM1 (promoter region deletion/duplication testing only), KIT, MEN1, MLH1, MSH2, MSH3, MSH6, MUTYH, NBN, NF1, NHTL1, PALB2, PDGFRA, PMS2, POLD1, POLE, PTEN, RAD50, RAD51C, RAD51D, RNF43, SDHB, SDHC, SDHD, SMAD4, SMARCA4. STK11, TP53,  TSC1, TSC2, and VHL.  The following genes were evaluated for sequence changes only: SDHA and HOXB13 c.251G>A variant only. The test report has been scanned into EPIC and is located under the  Molecular Pathology section of the Results Review tab.  A portion of the result report is included below for reference.     We discussed with Mr. Runkle that because current genetic testing is not perfect, it is possible there may be a gene mutation in one of these genes that current testing cannot detect, but that chance is small.  We also discussed, that there could be another gene that has not yet been discovered, or that we have not yet tested, that is responsible for the cancer diagnoses in the family. It is also possible there is a hereditary cause for the cancer in the family that Mr. Brickley did not inherit and therefore was not identified in his testing.  Therefore, it is important to remain in touch with cancer genetics in the future so that we can continue to offer Mr. Leever the most up to date genetic testing.   Genetic testing did identify a variant of uncertain significance (VUS) was identified in the PMS2 gene called c.5156A>T.  At this time, it is unknown if this variant is associated with increased cancer risk or if this is a normal finding, but most variants such as this get reclassified to being inconsequential. It should not be used to make medical management decisions. With time, we suspect the lab will determine the significance of this variant, if any. If we do learn more about it, we will try to contact Mr. Sunderlin to discuss it further. However, it is important to stay in touch with [place] periodically and keep the address and phone number up to date.  ADDITIONAL GENETIC TESTING: We discussed with Mr. Sermons that there are other genes that are associated with increased cancer risk that can be analyzed. Should Mr. Ruest wish to pursue additional genetic testing, we are happy to discuss and coordinate this testing, at any time.    CANCER SCREENING RECOMMENDATIONS: Mr. Huelsmann test result is considered negative (normal).  This means that we have not identified a hereditary cause for his  personal and family history of cancer at this time. Most cancers happen by chance and this negative test suggests that his cancer may fall into this category.    While reassuring, this does not definitively rule out a hereditary predisposition to cancer. It is still possible that there could be genetic mutations that are undetectable by current technology. There could be genetic mutations in genes that have not been tested or identified to increase cancer risk.  Therefore, it is recommended he continue to follow the cancer management and screening guidelines provided by his oncology and primary healthcare provider.   An individual's cancer risk and medical management are not determined by genetic test results alone. Overall cancer risk assessment incorporates additional factors, including personal medical history, family history, and any available genetic information that may result in a personalized plan for cancer prevention and surveillance  RECOMMENDATIONS FOR FAMILY MEMBERS:  Individuals in this family might be at some increased risk of developing cancer, over the general population risk, simply due to the family history of cancer.  We recommended women in this family have a yearly mammogram beginning at age 67, or 26 years younger than the earliest onset of cancer, an annual clinical breast exam, and perform monthly breast self-exams. Women in this family should also  have a gynecological exam as recommended by their primary provider. All family members should have a colonoscopy by age 56.  FOLLOW-UP: Lastly, we discussed with Mr. Swallows that cancer genetics is a rapidly advancing field and it is possible that new genetic tests will be appropriate for him and/or his family members in the future. We encouraged him to remain in contact with cancer genetics on an annual basis so we can update his personal and family histories and let him know of advances in cancer genetics that may benefit this family.    Our contact number was provided. Mr. Lepera questions were answered to his satisfaction, and he knows he is welcome to call us at anytime with additional questions or concerns.   Roma Kayser, MS, [facility] Certified Genetic Counselor [email or web address]'

## 2018-10-18 NOTE — Telephone Encounter (Signed)
Spoke with the patient's daughter, [name].  Revealed negative genetic testing.  Discussed that we do not know why he has breast cancer or why there is cancer in the family. It could be due to a different gene that we are not testing, or maybe our current technology may not be able to pick something up.  It will be important for him to keep in contact with genetics to keep up with whether additional testing may be needed.

## 2018-10-20 ENCOUNTER — Telehealth (HOSPITAL_COMMUNITY): Payer: Self-pay | Admitting: Surgery

## 2018-10-20 NOTE — Telephone Encounter (Signed)
[name] had called asking if her father's medication could be changed from [place] to [place], since her father's PSA has gone up.  I sent a message to Caryl Pina and she said Dr. Raliegh Ip would address it with the pt tomorrow and that Caryl Pina could call [place] during the visit.  Per [name], she will send her father with the phone fixed so that she can listen to the conversation with Dr. Raliegh Ip.

## 2018-10-21 ENCOUNTER — Encounter (HOSPITAL_COMMUNITY): Payer: Self-pay | Admitting: Hematology

## 2018-10-21 ENCOUNTER — Inpatient Hospital Stay (HOSPITAL_COMMUNITY): Payer: Medicare PPO | Admitting: Hematology

## 2018-10-21 ENCOUNTER — Other Ambulatory Visit: Payer: Self-pay

## 2018-10-21 VITALS — BP 148/87 | HR 74 | Temp 99.3°F | Resp 16 | Wt 226.0 lb

## 2018-10-21 DIAGNOSIS — C61 Malignant neoplasm of prostate: Secondary | ICD-10-CM | POA: Diagnosis not present

## 2018-10-21 DIAGNOSIS — C778 Secondary and unspecified malignant neoplasm of lymph nodes of multiple regions: Secondary | ICD-10-CM | POA: Diagnosis not present

## 2018-10-21 DIAGNOSIS — E8770 Fluid overload, unspecified: Secondary | ICD-10-CM

## 2018-10-21 DIAGNOSIS — Z87891 Personal history of nicotine dependence: Secondary | ICD-10-CM | POA: Diagnosis not present

## 2018-10-21 NOTE — Assessment & Plan Note (Addendum)
1.  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 -Last Lupron injection was in December 2019 at Dr. Gala Lewandowsky office. -PSA has steadily increased since November 2019. - CT suggest new small sclerotic lesions in T4 and T10.  But these are questionable given no uptake on bone scan.  He also got Zometa which could have created these lesions.  Bone scan was negative for any metastatic disease. PSA has inceased to 0.61.  - Plan to continue to monitor PSA. Once PSA, trends to 1.0 will plan to change him to enzalutamide.  -RTC in 2 mths.   2.  Androgen deprivation therapy induced bone loss:  -He received zoledronic acid every 3 months for a year which completed on 10/29/2017. -We will plan to repeat bone density in the future. -He will continue calcium and vitamin D supplements.    3.  Fluid overload: -We will continue Bumex 1 mg daily.   4.  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  He is able to do all his activities.  5.  Family history: -Maternal uncle had prostate cancer and mother had stomach cancer. -We will make a referral to Roma Kayser for BRCA1/2 testing.  If he does have germline mutations, he will have more options for targeted therapy. Marland Kitchen

## 2018-10-21 NOTE — Patient Instructions (Addendum)
[facility] at [facility] Discharge Instructions  You were seen today by Dr. Katragadda. He went over your recent lab and scan results. He will see you back in 2 months for labs and follow up.   Thank you for choosing [facility] at [facility] to provide your oncology and hematology care.  To afford each patient quality time with our provider, please arrive at least 15 minutes before your scheduled appointment time.   If you have a lab appointment with the Cancer Center please come in thru the  Main Entrance and check in at the main information desk  You need to re-schedule your appointment should you arrive 10 or more minutes late.  We strive to give you quality time with our providers, and arriving late affects you and other patients whose appointments are after yours.  Also, if you no show three or more times for appointments you may be dismissed from the clinic at the providers discretion.     Again, thank you for choosing [facility].  Our hope is that these requests will decrease the amount of time that you wait before being seen by our physicians.       _____________________________________________________________  Should you have questions after your visit to [facility], please contact our office at [phone] between the hours of 8:00 a.m. and 4:30 p.m.  Voicemails left after 4:00 p.m. will not be returned until the following business day.  For prescription refill requests, have your pharmacy contact our office and allow 72 hours.    Cancer Center Support Programs:   > Cancer Support Group  2nd Tuesday of the month 1pm-2pm, Journey Room    

## 2018-10-21 NOTE — Progress Notes (Signed)
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  The Common Hereditary Gene Panel offered by [facility] includes sequencing and/or deletion duplication testing of the following 48 genes: APC, ATM, AXIN2, BARD1, BMPR1A, BRCA1, BRCA2, BRIP1, CDH1, CDK4, CDKN2A (p14ARF), CDKN2A (p16INK4a), CHEK2, CTNNA1, DICER1, EPCAM (Deletion/duplication testing only), GREM1 (promoter region deletion/duplication testing only), KIT, MEN1, MLH1, MSH2, MSH3, MSH6, MUTYH, NBN, NF1, NHTL1, PALB2, PDGFRA, PMS2, POLD1, POLE, PTEN, RAD50, RAD51C, RAD51D, RNF43, SDHB, SDHC, SDHD, SMAD4, SMARCA4. STK11, TP53, TSC1, TSC2, and VHL.  The following genes were evaluated for sequence changes only: SDHA and HOXB13 c.251G>A variant only. The report date is Oct 14, 2018.      CANCER STAGING: Cancer Staging No matching staging information was found for the patient.   INTERVAL HISTORY:  Andre [name] 78 y.o. male returns for routine follow-up. He is here today alone with his daughter present via cell phone. He states that he has been well since his  last visit. He states that he continues taking the Zytiga as prescribed with no missed doses. Denies any nausea, or vomiting. Denies any new pains. Had not noticed any recent bleeding such as epistaxis, hematuria or hematochezia. Denies recent chest pain on exertion, shortness of breath on minimal exertion, pre-syncopal episodes, or palpitations. Denies any numbness or tingling in hands or feet. Denies any recent fevers, infections, or recent hospitalizations. Patient reports appetite at 100% and energy level at 100%.    REVIEW OF SYSTEMS:  Review of Systems  Cardiovascular: Positive for leg swelling.  Gastrointestinal: Positive for diarrhea.     PAST MEDICAL/SURGICAL HISTORY:  Past Medical History:  Diagnosis Date   Cancer [facility])    prostate   COPD (chronic obstructive pulmonary disease) ([place])    Diabetes mellitus without complication ([place])    Family history of ovarian cancer    Family history of prostate cancer    Hypertension    Past Surgical History:  Procedure Laterality Date   GOLD SEED IMPLANT       SOCIAL HISTORY:  Social History   Socioeconomic History   Marital status: Widowed    Spouse name: Not on file   Number of children: Not on file   Years of education: Not on file   Highest education level: Not on file  Occupational History   Not on file  Social Needs   Financial resource strain: Not on file   Food insecurity:    Worry: Not on file    Inability: Not on file   Transportation needs:    Medical: Not on file    Non-medical: Not on  file  Tobacco Use   Smoking status: Former Smoker    Types: Cigarettes    Last attempt to quit: 06/25/2014    Years since quitting: 4.3   Smokeless tobacco: Never Used   Tobacco comment: smoked since young age, quit in 2016  Substance and Sexual Activity   Alcohol use: Not Currently   Drug use: No   Sexual activity: Not on file  Lifestyle   Physical activity:    Days per week: Not on file     Minutes per session: Not on file   Stress: Not on file  Relationships   Social connections:    Talks on phone: Not on file    Gets together: Not on file    Attends religious service: Not on file    Active member of club or organization: Not on file    Attends meetings of clubs or organizations: Not on file    Relationship status: Not on file   Intimate partner violence:    Fear of current or ex partner: Not on file    Emotionally abused: Not on file    Physically abused: Not on file    Forced sexual activity: Not on file  Other Topics Concern   Not on file  Social History Narrative   Not on file    FAMILY HISTORY:  Family History  Problem Relation Age of Onset   Ovarian cancer Mother 61   Heart attack Father    Prostate cancer Maternal Uncle 18   Cancer Cousin        pat cousin with unknown cancer    CURRENT MEDICATIONS:  Outpatient Encounter Medications as of 10/21/2018  Medication Sig   acetaminophen (TYLENOL) 500 MG tablet Take 500 mg by mouth every 6 (six) hours as needed for moderate pain.   aspirin EC 81 MG tablet Take 81 mg by mouth daily.   atorvastatin (LIPITOR) 10 MG tablet Take 10 mg by mouth at bedtime.   brimonidine (ALPHAGAN) 0.2 % ophthalmic solution Place 1 drop into both eyes 3 (three) times daily.   bumetanide (BUMEX) 1 MG tablet Take 1 tablet (1 mg total) by mouth daily.   CALCIUM PO Take 600 mg by mouth daily.    cetirizine (ZYRTEC) 10 MG tablet Take 10 mg by mouth daily.   CHERATUSSIN AC 100-10 MG/5ML syrup Take 5 mLs by mouth daily as needed.    Cholecalciferol (VITAMIN D-3 PO) Take 1 tablet by mouth daily.   cyclobenzaprine (FLEXERIL) 10 MG tablet Take 10 mg by mouth as needed.    dicyclomine (BENTYL) 10 MG capsule Take 10 mg by mouth daily.   dorzolamide (TRUSOPT) 2 % ophthalmic solution Place 1 drop into both eyes 3 (three) times daily.   fluticasone (FLONASE) 50 MCG/ACT nasal spray USE 2 SPRAY(S) IN EACH NOSTRIL ONCE DAILY  FOR 30 DAYS   ibuprofen (ADVIL,MOTRIN) 600 MG tablet Take 600 mg by mouth every 6 (six) hours as needed.   latanoprost (XALATAN) 0.005 % ophthalmic solution Place 1 drop into both eyes at bedtime.   losartan (COZAAR) 100 MG tablet Take 100 mg by mouth daily.   magnesium oxide (MAG-OX) 400 MG tablet Take 1 tablet (400 mg total) by mouth 3 (three) times daily. (Patient taking differently: Take 400 mg by mouth every other day. )   metFORMIN (GLUCOPHAGE) 500 MG tablet Take 1,000 mg by mouth 2 (two) times daily with a meal.    potassium chloride SA (K-DUR,KLOR-CON) 20 MEQ tablet Take 2 tablets (  40 mEq total) by mouth daily.   predniSONE (DELTASONE) 5 MG tablet Take 1 tablet (5 mg total) by mouth daily with breakfast.   prochlorperazine (COMPAZINE) 10 MG tablet Take 10 mg by mouth every 6 (six) hours as needed for nausea or vomiting.   Tiotropium Bromide Monohydrate (SPIRIVA RESPIMAT) 1.25 MCG/ACT AERS    TRELEGY ELLIPTA 100-62.5-25 MCG/INH AEPB INHALE 1 PUFF ONCE DAILY   ZYTIGA 500 MG tablet TAKE 2 TABLETS BY MOUTH ONCE DAILY AS DIRECTED.  TAKE 1 HOUR BEFORE OR 2 HOURS AFTER A MEAL   [DISCONTINUED] DOCEtaxel (TAXOTERE IV) Taxotere   [DISCONTINUED] nilutamide (NILANDRON) 150 MG tablet    [DISCONTINUED] predniSONE (DELTASONE) 5 MG tablet Take 1 tablet by mouth once daily with breakfast   No facility-administered encounter medications on file as of 10/21/2018.     ALLERGIES:  Allergies  Allergen Reactions   Metoprolol      PHYSICAL EXAM:  ECOG Performance status: 1  Vitals:   10/21/18 1033  BP: (!) 148/87  Pulse: 74  Resp: 16  Temp: 99.3 F (37.4 C)  SpO2: 98%   Filed Weights   10/21/18 1033  Weight: 226 lb (102.5 kg)    Physical Exam Vitals signs reviewed.  Constitutional:      Appearance: Normal appearance.  Cardiovascular:     Rate and Rhythm: Normal rate and regular rhythm.     Heart sounds: Normal heart sounds.  Pulmonary:     Effort: Pulmonary effort is  normal.     Breath sounds: Normal breath sounds.  Abdominal:     General: There is no distension.     Palpations: Abdomen is soft. There is no mass.  Musculoskeletal:        General: Swelling present.  Skin:    General: Skin is warm.  Neurological:     General: No focal deficit present.     Mental Status: He is alert and oriented to person, place, and time.  Psychiatric:        Mood and Affect: Mood normal.        Behavior: Behavior normal.      LABORATORY DATA:  I have reviewed the labs as listed.  CBC    Component Value Date/Time   WBC 9.8 10/06/2018 1142   RBC 4.28 10/06/2018 1142   HGB 12.0 (L) 10/06/2018 1142   HCT 38.1 (L) 10/06/2018 1142   PLT 288 10/06/2018 1142   MCV 89.0 10/06/2018 1142   MCH 28.0 10/06/2018 1142   MCHC 31.5 10/06/2018 1142   RDW 14.4 10/06/2018 1142   LYMPHSABS 1.1 10/06/2018 1142   MONOABS 0.9 10/06/2018 1142   EOSABS 0.1 10/06/2018 1142   BASOSABS 0.1 10/06/2018 1142   CMP Latest Ref Rng & Units 10/06/2018 08/12/2018 06/10/2018  Glucose 70 - 99 mg/dL 134(H) 92 86  BUN 8 - 23 mg/dL 16 16 15   Creatinine 0.61 - 1.24 mg/dL 0.81 0.80 0.74  Sodium 135 - 145 mmol/L 141 138 139  Potassium 3.5 - 5.1 mmol/L 4.1 3.9 4.3  Chloride 98 - 111 mmol/L 104 105 106  CO2 22 - 32 mmol/L 28 25 28   Calcium 8.9 - 10.3 mg/dL 9.6 9.5 9.6  Total Protein 6.5 - 8.1 g/dL 6.8 6.4(L) 6.7  Total Bilirubin 0.3 - 1.2 mg/dL 0.8 0.8 0.8  Alkaline Phos 38 - 126 U/L 44 44 42  AST 15 - 41 U/L 13(L) 15 16  ALT 0 - 44 U/L 8 9 9  DIAGNOSTIC IMAGING:  I have independently reviewed the scans and discussed with the patient.   I have reviewed Venita Lick LPN's note and agree with the documentation.  I personally performed a face-to-face visit, made revisions and my assessment and plan is as follows.    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  Metastatic prostate cancer to the left supraclavicular and mediastinal lymph  nodes: -Docetaxel every 3 weeks for 14 cycles, discontinued as his PSA has plateaued around 11 from 08/21/2015 through 06/12/2016 -Zytiga and prednisone 5 mg daily started on 07/13/2016. -Last Lupron injection was in December 2019 at Dr. Gala Lewandowsky office. -PSA has steadily increased since November 2019. - CT suggest new small sclerotic lesions in T4 and T10.  But these are questionable given no uptake on bone scan.  He also got Zometa which could have created these lesions.  Bone scan was negative for any metastatic disease. PSA has inceased to 0.61.  - Plan to continue to monitor PSA. Once PSA, trends to 1.0 will plan to change him to enzalutamide.  -RTC in 2 mths.   2.  Androgen deprivation therapy induced bone loss:  -He received zoledronic acid every 3 months for a year which completed on 10/29/2017. -We will plan to repeat bone density in the future. -He will continue calcium and vitamin D supplements.    3.  Fluid overload: -We will continue Bumex 1 mg daily.   4.  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  He is able to do all his activities.  5.  Family history: -Maternal uncle had prostate cancer and mother had stomach cancer. -We will make a referral to [facility] Kayser for BRCA1/2 testing.  If he does have germline mutations, he will have more options for targeted therapy. .   Total time spent is 25 minutes with more than 50% of the time spent face-to-face discussing scan results and treatment  Orders placed this encounter:  Orders Placed This Encounter  Procedures   CBC with Differential/Platelet   Comprehensive metabolic panel   PSA      Derek Jack, MD [place] [phone]

## 2018-11-21 ENCOUNTER — Other Ambulatory Visit (HOSPITAL_COMMUNITY): Payer: Self-pay | Admitting: Hematology

## 2018-12-23 ENCOUNTER — Other Ambulatory Visit (HOSPITAL_COMMUNITY): Payer: Medicare PPO

## 2018-12-29 ENCOUNTER — Ambulatory Visit (HOSPITAL_COMMUNITY): Payer: Medicare PPO | Admitting: Hematology

## 2019-01-27 ENCOUNTER — Inpatient Hospital Stay (HOSPITAL_COMMUNITY): Payer: Medicare PPO | Attending: Hematology

## 2019-01-27 ENCOUNTER — Other Ambulatory Visit: Payer: Self-pay

## 2019-01-27 DIAGNOSIS — Z87891 Personal history of nicotine dependence: Secondary | ICD-10-CM | POA: Insufficient documentation

## 2019-01-27 DIAGNOSIS — C61 Malignant neoplasm of prostate: Secondary | ICD-10-CM | POA: Insufficient documentation

## 2019-01-27 DIAGNOSIS — C771 Secondary and unspecified malignant neoplasm of intrathoracic lymph nodes: Secondary | ICD-10-CM

## 2019-01-27 DIAGNOSIS — C778 Secondary and unspecified malignant neoplasm of lymph nodes of multiple regions: Secondary | ICD-10-CM | POA: Diagnosis not present

## 2019-01-27 LAB — COMPREHENSIVE METABOLIC PANEL
ALT: 8 U/L (ref 0–44)
AST: 16 U/L (ref 15–41)
Albumin: 3.6 g/dL (ref 3.5–5.0)
Alkaline Phosphatase: 41 U/L (ref 38–126)
Anion gap: 9 (ref 5–15)
BUN: 13 mg/dL (ref 8–23)
CO2: 27 mmol/L (ref 22–32)
Calcium: 9.5 mg/dL (ref 8.9–10.3)
Chloride: 105 mmol/L (ref 98–111)
Creatinine, Ser: 0.87 mg/dL (ref 0.61–1.24)
GFR calc Af Amer: >60 mL/min (ref >60)
GFR calc non Af Amer: >60 mL/min (ref >60)
Glucose, Bld: 108 mg/dL — ABNORMAL HIGH (ref 70–99)
Potassium: 4.3 mmol/L (ref 3.5–5.1)
Sodium: 141 mmol/L (ref 135–145)
Total Bilirubin: 0.8 mg/dL (ref 0.3–1.2)
Total Protein: 6.4 g/dL — ABNORMAL LOW (ref 6.5–8.1)

## 2019-01-27 LAB — CBC WITH DIFFERENTIAL/PLATELET
Abs Immature Granulocytes: 0.04 10*3/uL (ref 0.00–0.07)
Basophils Absolute: 0.1 10*3/uL (ref 0.0–0.1)
Basophils Relative: 1 %
Eosinophils Absolute: 0.1 10*3/uL (ref 0.0–0.5)
Eosinophils Relative: 1 %
HCT: 33.6 % — ABNORMAL LOW (ref 39.0–52.0)
Hemoglobin: 10.4 g/dL — ABNORMAL LOW (ref 13.0–17.0)
Immature Granulocytes: 0 %
Lymphocytes Relative: 10 %
Lymphs Abs: 1 10*3/uL (ref 0.7–4.0)
MCH: 27.8 pg (ref 26.0–34.0)
MCHC: 31 g/dL (ref 30.0–36.0)
MCV: 89.8 fL (ref 80.0–100.0)
Monocytes Absolute: 0.9 10*3/uL (ref 0.1–1.0)
Monocytes Relative: 9 %
Neutro Abs: 7.6 10*3/uL (ref 1.7–7.7)
Neutrophils Relative %: 79 %
Platelets: 417 10*3/uL — ABNORMAL HIGH (ref 150–400)
RBC: 3.74 MIL/uL — ABNORMAL LOW (ref 4.22–5.81)
RDW: 15.3 % (ref 11.5–15.5)
WBC: 9.6 10*3/uL (ref 4.0–10.5)
nRBC: 0 % (ref 0.0–0.2)

## 2019-01-27 LAB — PSA: Prostatic Specific Antigen: 0.79 ng/mL (ref 0.00–4.00)

## 2019-02-02 ENCOUNTER — Inpatient Hospital Stay (HOSPITAL_COMMUNITY): Payer: Medicare PPO | Attending: Hematology | Admitting: Hematology

## 2019-02-02 ENCOUNTER — Encounter (HOSPITAL_COMMUNITY): Payer: Self-pay | Admitting: Hematology

## 2019-02-02 ENCOUNTER — Other Ambulatory Visit: Payer: Self-pay

## 2019-02-02 VITALS — BP 173/80 | HR 66 | Temp 97.5°F | Resp 16 | Wt 234.5 lb

## 2019-02-02 DIAGNOSIS — E8770 Fluid overload, unspecified: Secondary | ICD-10-CM | POA: Diagnosis not present

## 2019-02-02 DIAGNOSIS — J449 Chronic obstructive pulmonary disease, unspecified: Secondary | ICD-10-CM | POA: Diagnosis not present

## 2019-02-02 DIAGNOSIS — Z8042 Family history of malignant neoplasm of prostate: Secondary | ICD-10-CM | POA: Insufficient documentation

## 2019-02-02 DIAGNOSIS — Z9221 Personal history of antineoplastic chemotherapy: Secondary | ICD-10-CM | POA: Insufficient documentation

## 2019-02-02 DIAGNOSIS — Z7984 Long term (current) use of oral hypoglycemic drugs: Secondary | ICD-10-CM | POA: Insufficient documentation

## 2019-02-02 DIAGNOSIS — I10 Essential (primary) hypertension: Secondary | ICD-10-CM | POA: Insufficient documentation

## 2019-02-02 DIAGNOSIS — Z7982 Long term (current) use of aspirin: Secondary | ICD-10-CM | POA: Insufficient documentation

## 2019-02-02 DIAGNOSIS — G629 Polyneuropathy, unspecified: Secondary | ICD-10-CM | POA: Insufficient documentation

## 2019-02-02 DIAGNOSIS — E119 Type 2 diabetes mellitus without complications: Secondary | ICD-10-CM | POA: Insufficient documentation

## 2019-02-02 DIAGNOSIS — Z87891 Personal history of nicotine dependence: Secondary | ICD-10-CM | POA: Diagnosis not present

## 2019-02-02 DIAGNOSIS — Z8619 Personal history of other infectious and parasitic diseases: Secondary | ICD-10-CM | POA: Insufficient documentation

## 2019-02-02 DIAGNOSIS — Z8041 Family history of malignant neoplasm of ovary: Secondary | ICD-10-CM | POA: Insufficient documentation

## 2019-02-02 DIAGNOSIS — Z79899 Other long term (current) drug therapy: Secondary | ICD-10-CM | POA: Diagnosis not present

## 2019-02-02 DIAGNOSIS — C61 Malignant neoplasm of prostate: Secondary | ICD-10-CM | POA: Insufficient documentation

## 2019-02-02 DIAGNOSIS — C771 Secondary and unspecified malignant neoplasm of intrathoracic lymph nodes: Secondary | ICD-10-CM | POA: Diagnosis not present

## 2019-02-02 MED ORDER — BUMETANIDE 1 MG PO TABS: 1.0000 mg | ORAL_TABLET | Freq: Every day | ORAL | 6 refills | Status: DC

## 2019-02-02 NOTE — Assessment & Plan Note (Addendum)
1.  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 - Zytiga and prednisone 5 mg daily started on 07/13/2016. - He receives Lupron injection at Dr. Gala Lewandowsky office in [place]. -PSA has slowly but steadily increased since November 2019. - CT CAP on 10/14/2018 shows new small sclerotic lesions in T4 and T10.  But discs are questionable given no uptake on bone scan.  He also received Zometa which could alter the bones.  Bone scan on the same day was negative for any metastatic disease. - He is tolerating Zytiga very well.  Even though his systolic blood pressure today is 173, his blood pressure at home stays below 140.  He is taking losartan 100 mg daily. -We reviewed his labs today.  PSA has increased to 0.79 from 0.61 at last visit 3 months ago. - Patient was reportedly admitted to [facility] from 12/20/2018 through 12/22/2018 with COVID infection. - Despite increasing PSA, as it is only slowly rising, we chose not to change any treatments at this time.  If there is drastic change or if it goes above 1, will consider changing him to enzalutamide.  Patient and daughter are agreeable to this plan. -We will see him back in 2 months for follow-up with repeat labs.  2.  Androgen deprivation therapy induced bone loss: -He received Zometa every 3 months for a year completed on 10/29/2017. - We plan to repeat bone density in the near future.  He will continue calcium and vitamin D supplements.  3.  Fluid overload: -He will continue Bumex 1 mg daily.  4.  Genetic testing: -Maternal uncle had prostate cancer and mother had stomach cancer. - Germline mutation testing done did not show any mutations.  5.  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  He is able to do all his activities.

## 2019-02-02 NOTE — Patient Instructions (Addendum)
[facility] at [facility] Discharge Instructions  You were seen today by Dr. Katragadda. He went over your recent lab results. He will see you back in 2 months for labs and follow up.   Thank you for choosing [facility] at [facility] to provide your oncology and hematology care.  To afford each patient quality time with our provider, please arrive at least 15 minutes before your scheduled appointment time.   If you have a lab appointment with the Cancer Center please come in thru the  Main Entrance and check in at the main information desk  You need to re-schedule your appointment should you arrive 10 or more minutes late.  We strive to give you quality time with our providers, and arriving late affects you and other patients whose appointments are after yours.  Also, if you no show three or more times for appointments you may be dismissed from the clinic at the providers discretion.     Again, thank you for choosing [facility].  Our hope is that these requests will decrease the amount of time that you wait before being seen by our physicians.       _____________________________________________________________  Should you have questions after your visit to [facility], please contact our office at [phone] between the hours of 8:00 a.m. and 4:30 p.m.  Voicemails left after 4:00 p.m. will not be returned until the following business day.  For prescription refill requests, have your pharmacy contact our office and allow 72 hours.    Cancer Center Support Programs:   > Cancer Support Group  2nd Tuesday of the month 1pm-2pm, Journey Room    

## 2019-02-02 NOTE — Progress Notes (Signed)
[place],  [id number]   CLINIC:  Medical Oncology/Hematology  PCP:  Joseph Art, MD [place] [address] [address] [phone]   REASON FOR VISIT:  Follow-up for metastatic prostate cancer to intrathoracic lymph node  CURRENT THERAPY:Zytiga and prednisone 5 mg daily    BRIEF ONCOLOGIC HISTORY:  Oncology History  Prostate cancer metastatic to intrathoracic lymph node ([place])  07/24/2015 Initial Diagnosis   Prostate cancer metastatic to the left supraclavicular and anterior mediastinal lymph nodes   08/21/2015 - 06/12/2016 Chemotherapy   Docetaxel every 3 weeks for 14 cycles, discontinued as PSA has plateaued around 11    07/13/2016 Treatment Plan Change   Zytiga 1000 milligrams daily along with prednisone 5 mg daily, started secondary to fluid overload from docetaxel   10/14/2018 Genetic Testing   Negative genetic testing.  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  The Common Hereditary Gene Panel offered by [facility] includes sequencing and/or deletion duplication testing of the following 48 genes: APC, ATM, AXIN2, BARD1, BMPR1A, BRCA1, BRCA2, BRIP1, CDH1, CDK4, CDKN2A (p14ARF), CDKN2A (p16INK4a), CHEK2, CTNNA1, DICER1, EPCAM (Deletion/duplication testing only), GREM1 (promoter region deletion/duplication testing only), KIT, MEN1, MLH1, MSH2, MSH3, MSH6, MUTYH, NBN, NF1, NHTL1, PALB2, PDGFRA, PMS2, POLD1, POLE, PTEN, RAD50, RAD51C, RAD51D, RNF43, SDHB, SDHC, SDHD, SMAD4, SMARCA4. STK11, TP53, TSC1, TSC2, and VHL.  The following genes were evaluated for sequence changes only: SDHA and HOXB13 c.251G>A variant only. The report date is Oct 14, 2018.      CANCER STAGING: Cancer Staging No matching staging information was found for the patient.   INTERVAL HISTORY:  Mr. Burandt 78 y.o. male seen for follow-up of metastatic prostate cancer.  He is accompanied by his daughter.  He was reportedly hospitalized for COVID  infection at [facility] from 12/20/2018 through 12/22/2018.  He is tolerating Zytiga very well.  He is also taking prednisone 5 mg daily.  He takes his blood pressure at home and it stays below 140.  He is taking losartan 100 mg.  Denies any new onset pains.  Appetite is 100% and energy levels are 75%.  Denies nausea vomiting diarrhea or constipation.    REVIEW OF SYSTEMS:  Review of Systems  All other systems reviewed and are negative.    PAST MEDICAL/SURGICAL HISTORY:  Past Medical History:  Diagnosis Date   Cancer [facility])    prostate   COPD (chronic obstructive pulmonary disease) ([place])    Diabetes mellitus without complication ([place])    Family history of ovarian cancer    Family history of prostate cancer    Hypertension    Past Surgical History:  Procedure Laterality Date   GOLD SEED IMPLANT       SOCIAL HISTORY:  Social History   Socioeconomic History   Marital status: Widowed    Spouse name: Not on file   Number of children: Not on file   Years of education: Not on file   Highest education level: Not on file  Occupational History   Not on file  Social Needs   Financial resource strain: Not on file   Food insecurity    Worry: Not on file    Inability: Not on file   Transportation needs    Medical: Not on file    Non-medical: Not on file  Tobacco Use   Smoking status: Former Smoker    Types: Cigarettes    Quit date: 06/25/2014    Years since quitting: 4.6  Smokeless tobacco: Never Used   Tobacco comment: smoked since young age, quit in 2016  Substance and Sexual Activity   Alcohol use: Not Currently   Drug use: No   Sexual activity: Not on file  Lifestyle   Physical activity    Days per week: Not on file    Minutes per session: Not on file   Stress: Not on file  Relationships   Social connections    Talks on phone: Not on file    Gets together: Not on file    Attends religious service: Not on file    Active member of club or  organization: Not on file    Attends meetings of clubs or organizations: Not on file    Relationship status: Not on file   Intimate partner violence    Fear of current or ex partner: Not on file    Emotionally abused: Not on file    Physically abused: Not on file    Forced sexual activity: Not on file  Other Topics Concern   Not on file  Social History Narrative   Not on file    FAMILY HISTORY:  Family History  Problem Relation Age of Onset   Ovarian cancer Mother 35   Heart attack Father    Prostate cancer Maternal Uncle 61   Cancer Cousin        pat cousin with unknown cancer    CURRENT MEDICATIONS:  Outpatient Encounter Medications as of 02/02/2019  Medication Sig   aspirin EC 81 MG tablet Take 81 mg by mouth daily.   atorvastatin (LIPITOR) 10 MG tablet Take 10 mg by mouth at bedtime.   brimonidine (ALPHAGAN) 0.2 % ophthalmic solution Place 1 drop into both eyes 3 (three) times daily.   bumetanide (BUMEX) 1 MG tablet Take 1 tablet (1 mg total) by mouth daily.   CALCIUM PO Take 600 mg by mouth daily.    Cholecalciferol (VITAMIN D-3 PO) Take 1 tablet by mouth daily.   dicyclomine (BENTYL) 10 MG capsule Take 10 mg by mouth daily.   dorzolamide (TRUSOPT) 2 % ophthalmic solution Place 1 drop into both eyes 3 (three) times daily.   latanoprost (XALATAN) 0.005 % ophthalmic solution Place 1 drop into both eyes at bedtime.   losartan (COZAAR) 100 MG tablet Take 100 mg by mouth daily.   magnesium oxide (MAG-OX) 400 MG tablet Take 1 tablet (400 mg total) by mouth 3 (three) times daily. (Patient taking differently: Take 400 mg by mouth every other day. )   metFORMIN (GLUCOPHAGE) 500 MG tablet Take 1,000 mg by mouth 2 (two) times daily with a meal.    potassium chloride SA (K-DUR) 20 MEQ tablet TAKE 2 TABLETS BY MOUTH EVERY DAY   predniSONE (DELTASONE) 5 MG tablet Take 1 tablet (5 mg total) by mouth daily with breakfast.   TRELEGY ELLIPTA 100-62.5-25 MCG/INH AEPB  INHALE 1 PUFF ONCE DAILY   ZYTIGA 500 MG tablet TAKE 2 TABLETS BY MOUTH ONCE DAILY AS DIRECTED.  TAKE 1 HOUR BEFORE OR 2 HOURS AFTER A MEAL   [DISCONTINUED] bumetanide (BUMEX) 1 MG tablet Take 1 tablet (1 mg total) by mouth daily.   acetaminophen (TYLENOL) 500 MG tablet Take 500 mg by mouth every 6 (six) hours as needed for moderate pain.   cetirizine (ZYRTEC) 10 MG tablet Take 10 mg by mouth daily.   CHERATUSSIN AC 100-10 MG/5ML syrup Take 5 mLs by mouth daily as needed.    cyclobenzaprine (FLEXERIL) 10 MG tablet  Take 10 mg by mouth as needed.    fluticasone (FLONASE) 50 MCG/ACT nasal spray USE 2 SPRAY(S) IN EACH NOSTRIL ONCE DAILY FOR 30 DAYS   ibuprofen (ADVIL,MOTRIN) 600 MG tablet Take 600 mg by mouth every 6 (six) hours as needed.   prochlorperazine (COMPAZINE) 10 MG tablet Take 10 mg by mouth every 6 (six) hours as needed for nausea or vomiting.   Tiotropium Bromide Monohydrate (SPIRIVA RESPIMAT) 1.25 MCG/ACT AERS    No facility-administered encounter medications on file as of 02/02/2019.     ALLERGIES:  Allergies  Allergen Reactions   Metoprolol      PHYSICAL EXAM:  ECOG Performance status: 1  Vitals:   02/02/19 1440  BP: (!) 173/80  Pulse: 66  Resp: 16  Temp: (!) 97.5 F (36.4 C)  SpO2: 99%   Filed Weights   02/02/19 1440  Weight: 234 lb 8 oz (106.4 kg)    Physical Exam Vitals signs reviewed.  Constitutional:      Appearance: Normal appearance.  Cardiovascular:     Rate and Rhythm: Normal rate and regular rhythm.     Heart sounds: Normal heart sounds.  Pulmonary:     Effort: Pulmonary effort is normal.     Breath sounds: Normal breath sounds.  Abdominal:     General: There is no distension.     Palpations: Abdomen is soft. There is no mass.  Musculoskeletal:        General: Swelling present.  Skin:    General: Skin is warm.  Neurological:     General: No focal deficit present.     Mental Status: He is alert and oriented to person, place, and  time.  Psychiatric:        Mood and Affect: Mood normal.        Behavior: Behavior normal.      LABORATORY DATA:  I have reviewed the labs as listed.  CBC    Component Value Date/Time   WBC 9.6 01/27/2019 1322   RBC 3.74 (L) 01/27/2019 1322   HGB 10.4 (L) 01/27/2019 1322   HCT 33.6 (L) 01/27/2019 1322   PLT 417 (H) 01/27/2019 1322   MCV 89.8 01/27/2019 1322   MCH 27.8 01/27/2019 1322   MCHC 31.0 01/27/2019 1322   RDW 15.3 01/27/2019 1322   LYMPHSABS 1.0 01/27/2019 1322   MONOABS 0.9 01/27/2019 1322   EOSABS 0.1 01/27/2019 1322   BASOSABS 0.1 01/27/2019 1322   CMP Latest Ref Rng & Units 01/27/2019 10/06/2018 08/12/2018  Glucose 70 - 99 mg/dL 108(H) 134(H) 92  BUN 8 - 23 mg/dL 13 16 16   Creatinine 0.61 - 1.24 mg/dL 0.87 0.81 0.80  Sodium 135 - 145 mmol/L 141 141 138  Potassium 3.5 - 5.1 mmol/L 4.3 4.1 3.9  Chloride 98 - 111 mmol/L 105 104 105  CO2 22 - 32 mmol/L 27 28 25   Calcium 8.9 - 10.3 mg/dL 9.5 9.6 9.5  Total Protein 6.5 - 8.1 g/dL 6.4(L) 6.8 6.4(L)  Total Bilirubin 0.3 - 1.2 mg/dL 0.8 0.8 0.8  Alkaline Phos 38 - 126 U/L 41 44 44  AST 15 - 41 U/L 16 13(L) 15  ALT 0 - 44 U/L 8 8 9        DIAGNOSTIC IMAGING:  I have independently reviewed the scans and discussed with the patient.   I have reviewed Venita Lick LPN's note and agree with the documentation.  I personally performed a face-to-face visit, made revisions and my assessment and plan is as follows.  ASSESSMENT & PLAN:   Prostate cancer metastatic to intrathoracic lymph node ([place]) 1.  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 - Zytiga and prednisone 5 mg daily started on 07/13/2016. - He receives Lupron injection at Dr. Gala Lewandowsky office in [place]. -PSA has slowly but steadily increased since November 2019. - CT CAP on 10/14/2018 shows  new small sclerotic lesions in T4 and T10.  But discs are questionable given no uptake on bone scan.  He also received Zometa which could alter the bones.  Bone scan on the same day was negative for any metastatic disease. - He is tolerating Zytiga very well.  Even though his systolic blood pressure today is 173, his blood pressure at home stays below 140.  He is taking losartan 100 mg daily. -We reviewed his labs today.  PSA has increased to 0.79 from 0.61 at last visit 3 months ago. - Patient was reportedly admitted to [facility] from 12/20/2018 through 12/22/2018 with COVID infection. - Despite increasing PSA, as it is only slowly rising, we chose not to change any treatments at this time.  If there is drastic change or if it goes above 1, will consider changing him to enzalutamide.  Patient and daughter are agreeable to this plan. -We will see him back in 2 months for follow-up with repeat labs.  2.  Androgen deprivation therapy induced bone loss: -He received Zometa every 3 months for a year completed on 10/29/2017. - We plan to repeat bone density in the near future.  He will continue calcium and vitamin D supplements.  3.  Fluid overload: -He will continue Bumex 1 mg daily.  4.  Genetic testing: -Maternal uncle had prostate cancer and mother had stomach cancer. - Germline mutation testing done did not show any mutations.  5.  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  He is able to do all his activities.   Total time spent is 25 minutes with more than 50% of the time spent face-to-face discussing lab results, counseling and coordination of care.  Orders placed this encounter:  Orders Placed This Encounter  Procedures   CBC with Differential/Platelet   Comprehensive metabolic panel   PSA   Ferritin   Iron and TIBC      Derek Jack, MD [place] [phone]

## 2019-03-06 ENCOUNTER — Other Ambulatory Visit (HOSPITAL_COMMUNITY): Payer: Self-pay | Admitting: Hematology

## 2019-03-30 ENCOUNTER — Other Ambulatory Visit: Payer: Self-pay

## 2019-03-30 ENCOUNTER — Inpatient Hospital Stay (HOSPITAL_COMMUNITY): Payer: Medicare PPO | Attending: Hematology

## 2019-03-30 DIAGNOSIS — C771 Secondary and unspecified malignant neoplasm of intrathoracic lymph nodes: Secondary | ICD-10-CM | POA: Insufficient documentation

## 2019-03-30 DIAGNOSIS — C61 Malignant neoplasm of prostate: Secondary | ICD-10-CM | POA: Insufficient documentation

## 2019-03-30 LAB — CBC WITH DIFFERENTIAL/PLATELET
Abs Immature Granulocytes: 0.02 10*3/uL (ref 0.00–0.07)
Basophils Absolute: 0.1 10*3/uL (ref 0.0–0.1)
Basophils Relative: 1 %
Eosinophils Absolute: 0.1 10*3/uL (ref 0.0–0.5)
Eosinophils Relative: 1 %
HCT: 35.3 % — ABNORMAL LOW (ref 39.0–52.0)
Hemoglobin: 10.9 g/dL — ABNORMAL LOW (ref 13.0–17.0)
Immature Granulocytes: 0 %
Lymphocytes Relative: 12 %
Lymphs Abs: 1.1 10*3/uL (ref 0.7–4.0)
MCH: 27.6 pg (ref 26.0–34.0)
MCHC: 30.9 g/dL (ref 30.0–36.0)
MCV: 89.4 fL (ref 80.0–100.0)
Monocytes Absolute: 0.8 10*3/uL (ref 0.1–1.0)
Monocytes Relative: 8 %
Neutro Abs: 7.3 10*3/uL (ref 1.7–7.7)
Neutrophils Relative %: 78 %
Platelets: 350 10*3/uL (ref 150–400)
RBC: 3.95 MIL/uL — ABNORMAL LOW (ref 4.22–5.81)
RDW: 14.1 % (ref 11.5–15.5)
WBC: 9.4 10*3/uL (ref 4.0–10.5)
nRBC: 0 % (ref 0.0–0.2)

## 2019-03-30 LAB — COMPREHENSIVE METABOLIC PANEL
ALT: 9 U/L (ref 0–44)
AST: 18 U/L (ref 15–41)
Albumin: 3.8 g/dL (ref 3.5–5.0)
Alkaline Phosphatase: 45 U/L (ref 38–126)
Anion gap: 10 (ref 5–15)
BUN: 16 mg/dL (ref 8–23)
CO2: 26 mmol/L (ref 22–32)
Calcium: 9.5 mg/dL (ref 8.9–10.3)
Chloride: 101 mmol/L (ref 98–111)
Creatinine, Ser: 1 mg/dL (ref 0.61–1.24)
GFR calc Af Amer: >60 mL/min (ref >60)
GFR calc non Af Amer: >60 mL/min (ref >60)
Glucose, Bld: 114 mg/dL — ABNORMAL HIGH (ref 70–99)
Potassium: 3.9 mmol/L (ref 3.5–5.1)
Sodium: 137 mmol/L (ref 135–145)
Total Bilirubin: 0.6 mg/dL (ref 0.3–1.2)
Total Protein: 6.3 g/dL — ABNORMAL LOW (ref 6.5–8.1)

## 2019-03-30 LAB — IRON AND TIBC
Iron: 40 ug/dL — ABNORMAL LOW (ref 45–182)
Saturation Ratios: 12 % — ABNORMAL LOW (ref 17.9–39.5)
TIBC: 336 ug/dL (ref 250–450)
UIBC: 296 ug/dL

## 2019-03-30 LAB — FERRITIN: Ferritin: 119 ng/mL (ref 24–336)

## 2019-03-31 LAB — PSA: Prostatic Specific Antigen: 0.93 ng/mL (ref 0.00–4.00)

## 2019-04-06 ENCOUNTER — Other Ambulatory Visit: Payer: Self-pay

## 2019-04-06 ENCOUNTER — Encounter (HOSPITAL_COMMUNITY): Payer: Self-pay | Admitting: Hematology

## 2019-04-06 ENCOUNTER — Inpatient Hospital Stay (HOSPITAL_COMMUNITY): Payer: Medicare PPO | Attending: Hematology | Admitting: Hematology

## 2019-04-06 VITALS — BP 162/66 | HR 49 | Temp 97.6°F | Resp 18 | Wt 235.7 lb

## 2019-04-06 DIAGNOSIS — G629 Polyneuropathy, unspecified: Secondary | ICD-10-CM | POA: Diagnosis not present

## 2019-04-06 DIAGNOSIS — C61 Malignant neoplasm of prostate: Secondary | ICD-10-CM | POA: Diagnosis not present

## 2019-04-06 DIAGNOSIS — Z7984 Long term (current) use of oral hypoglycemic drugs: Secondary | ICD-10-CM | POA: Insufficient documentation

## 2019-04-06 DIAGNOSIS — Z9221 Personal history of antineoplastic chemotherapy: Secondary | ICD-10-CM | POA: Insufficient documentation

## 2019-04-06 DIAGNOSIS — E119 Type 2 diabetes mellitus without complications: Secondary | ICD-10-CM | POA: Insufficient documentation

## 2019-04-06 DIAGNOSIS — Z8042 Family history of malignant neoplasm of prostate: Secondary | ICD-10-CM | POA: Diagnosis not present

## 2019-04-06 DIAGNOSIS — J449 Chronic obstructive pulmonary disease, unspecified: Secondary | ICD-10-CM | POA: Diagnosis not present

## 2019-04-06 DIAGNOSIS — Z79899 Other long term (current) drug therapy: Secondary | ICD-10-CM | POA: Diagnosis not present

## 2019-04-06 DIAGNOSIS — I10 Essential (primary) hypertension: Secondary | ICD-10-CM | POA: Insufficient documentation

## 2019-04-06 DIAGNOSIS — Z87891 Personal history of nicotine dependence: Secondary | ICD-10-CM | POA: Diagnosis not present

## 2019-04-06 DIAGNOSIS — Z8041 Family history of malignant neoplasm of ovary: Secondary | ICD-10-CM | POA: Insufficient documentation

## 2019-04-06 DIAGNOSIS — C771 Secondary and unspecified malignant neoplasm of intrathoracic lymph nodes: Secondary | ICD-10-CM | POA: Diagnosis not present

## 2019-04-06 DIAGNOSIS — E8770 Fluid overload, unspecified: Secondary | ICD-10-CM | POA: Diagnosis not present

## 2019-04-06 DIAGNOSIS — Z7982 Long term (current) use of aspirin: Secondary | ICD-10-CM | POA: Insufficient documentation

## 2019-04-06 MED ORDER — XTANDI 40 MG PO CAPS: 160.0000 mg | ORAL_CAPSULE | Freq: Every day | ORAL | 0 refills | Status: DC

## 2019-04-06 NOTE — Patient Instructions (Addendum)
[facility] at [facility] Discharge Instructions  You were seen today by Dr. Katragadda. He went over your recent lab results. He will see you back in 4 weeks for labs and follow up.   Thank you for choosing [facility] at [facility] to provide your oncology and hematology care.  To afford each patient quality time with our provider, please arrive at least 15 minutes before your scheduled appointment time.   If you have a lab appointment with the Cancer Center please come in thru the  Main Entrance and check in at the main information desk  You need to re-schedule your appointment should you arrive 10 or more minutes late.  We strive to give you quality time with our providers, and arriving late affects you and other patients whose appointments are after yours.  Also, if you no show three or more times for appointments you may be dismissed from the clinic at the providers discretion.     Again, thank you for choosing [facility].  Our hope is that these requests will decrease the amount of time that you wait before being seen by our physicians.       _____________________________________________________________  Should you have questions after your visit to [facility], please contact our office at [phone] between the hours of 8:00 a.m. and 4:30 p.m.  Voicemails left after 4:00 p.m. will not be returned until the following business day.  For prescription refill requests, have your pharmacy contact our office and allow 72 hours.    Cancer Center Support Programs:   > Cancer Support Group  2nd Tuesday of the month 1pm-2pm, Journey Room    

## 2019-04-06 NOTE — Progress Notes (Signed)
° °[facility] °[address] 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  The Common Hereditary Gene Panel offered by [facility] includes sequencing and/or deletion duplication testing of the following 48 genes: APC, ATM, AXIN2, BARD1, BMPR1A, BRCA1, BRCA2, BRIP1, CDH1, CDK4, CDKN2A (p14ARF), CDKN2A (p16INK4a), CHEK2, CTNNA1, DICER1, EPCAM (Deletion/duplication testing only), GREM1 (promoter region deletion/duplication testing only), KIT, MEN1, MLH1, MSH2, MSH3, MSH6, MUTYH, NBN, NF1, NHTL1, PALB2, PDGFRA, PMS2, POLD1, POLE, PTEN, RAD50, RAD51C, RAD51D, RNF43, SDHB, SDHC, SDHD, SMAD4, SMARCA4. STK11, TP53, TSC1, TSC2, and VHL.  The following genes were evaluated for sequence changes only: SDHA and HOXB13 c.251G>A variant only. The report date is Oct 14, 2018. °  ° ° ° °CANCER STAGING: °Cancer Staging °No matching staging information was found for the patient. ° ° °INTERVAL HISTORY:  °Andre [name] 77 y.o. male seen for follow-up of metastatic prostate cancer to the lymph nodes.  He is taking abiraterone with prednisone daily.  He is  tolerating it very well.  He receives Lupron injection in [place].  Denies any new onset pains.  Continues to have feeling of cold feet when he takes off his shoes and socks.  He is okay as long as he sleeps with his socks on.  Appetite is 100%.  Energy levels are 75%.  No new bone pains reported.  Denies any fevers or infections. ° ° ° °REVIEW OF SYSTEMS:  °Review of Systems  °All other systems reviewed and are negative. ° ° ° °PAST MEDICAL/SURGICAL HISTORY:  °Past Medical History:  °Diagnosis Date  °• Cancer (HCC)   ° prostate  °• COPD (chronic obstructive pulmonary disease) (HCC)   °• Diabetes mellitus without complication (HCC)   °• Family history of ovarian cancer   °• Family history of prostate cancer   °• Hypertension   ° °Past Surgical History:  °Procedure Laterality Date  °• GOLD SEED IMPLANT    ° ° ° °SOCIAL HISTORY:  °Social History  ° °Socioeconomic History  °• Marital status: Widowed  °  Spouse name: Not on file  °• Number of children: Not on file  °• Years of education: Not on file  °• Highest education level: Not on file  °Occupational History  °• Not on file  °Social Needs  °• Financial resource strain: Not on file  °• Food insecurity  °  Worry: Not on file  °  Inability: Not on file  °• Transportation needs  °  Medical: Not on file  °  Non-medical: Not on file  °Tobacco Use  °• Smoking status: Former Smoker  °  Types: Cigarettes  °  Quit date: 06/25/2014  °  Years since quitting:   quitting: 4.7   Smokeless tobacco: Never Used   Tobacco comment: smoked since young age, quit in 2016  Substance and Sexual Activity   Alcohol use: Not Currently   Drug use: No   Sexual activity: Not on file  Lifestyle   Physical activity    Days per week: Not on file    Minutes per session: Not on file   Stress: Not on file  Relationships   Social connections    Talks on phone: Not on file    Gets together: Not on file    Attends religious service: Not on file    Active member of club or organization: Not  on file    Attends meetings of clubs or organizations: Not on file    Relationship status: Not on file   Intimate partner violence    Fear of current or ex partner: Not on file    Emotionally abused: Not on file    Physically abused: Not on file    Forced sexual activity: Not on file  Other Topics Concern   Not on file  Social History Narrative   Not on file    FAMILY HISTORY:  Family History  Problem Relation Age of Onset   Ovarian cancer Mother 73   Heart attack Father    Prostate cancer Maternal Uncle 29   Cancer Cousin        pat cousin with unknown cancer    CURRENT MEDICATIONS:  Outpatient Encounter Medications as of 04/06/2019  Medication Sig   aspirin EC 81 MG tablet Take 81 mg by mouth daily.   atorvastatin (LIPITOR) 10 MG tablet Take 10 mg by mouth at bedtime.   brimonidine (ALPHAGAN) 0.2 % ophthalmic solution Place 1 drop into both eyes 3 (three) times daily.   bumetanide (BUMEX) 1 MG tablet Take 1 tablet (1 mg total) by mouth daily.   CALCIUM PO Take 600 mg by mouth daily.    Cholecalciferol (VITAMIN D-3 PO) Take 1 tablet by mouth daily.   dicyclomine (BENTYL) 10 MG capsule Take 10 mg by mouth daily.   dorzolamide (TRUSOPT) 2 % ophthalmic solution Place 1 drop into both eyes 3 (three) times daily.   Latanoprostene Bunod (VYZULTA) 0.024 % SOLN Apply 1 drop to eye 1 day or 1 dose.   losartan (COZAAR) 100 MG tablet Take 100 mg by mouth daily.   magnesium oxide (MAG-OX) 400 MG tablet Take 1 tablet (400 mg total) by mouth every other day.   metFORMIN (GLUCOPHAGE) 500 MG tablet Take 1,000 mg by mouth 2 (two) times daily with a meal.    potassium chloride SA (K-DUR) 20 MEQ tablet TAKE 2 TABLETS BY MOUTH EVERY DAY   predniSONE (DELTASONE) 5 MG tablet Take 1 tablet (5 mg total) by mouth daily with breakfast.   TRELEGY ELLIPTA 100-62.5-25 MCG/INH AEPB INHALE 1 PUFF ONCE DAILY   ZYTIGA 500 MG tablet TAKE 2 TABLETS BY MOUTH ONCE DAILY AS DIRECTED.   TAKE 1 HOUR BEFORE OR 2 HOURS AFTER A MEAL   acetaminophen (TYLENOL) 500 MG tablet Take 500 mg by mouth every 6 (six) hours as needed for moderate pain.   cetirizine (ZYRTEC) 10 MG tablet Take 10 mg by mouth daily.   CHERATUSSIN AC 100-10 MG/5ML syrup Take 5 mLs by mouth daily as needed.    cyclobenzaprine (FLEXERIL) 10 MG tablet Take 10 mg by mouth as needed.    enzalutamide (XTANDI) 40 MG capsule Take 4 capsules (160 mg total) by mouth daily.  fluticasone (FLONASE) 50 MCG/ACT nasal spray USE 2 SPRAY(S) IN EACH NOSTRIL ONCE DAILY FOR 30 DAYS   ibuprofen (ADVIL,MOTRIN) 600 MG tablet Take 600 mg by mouth every 6 (six) hours as needed.   prochlorperazine (COMPAZINE) 10 MG tablet Take 10 mg by mouth every 6 (six) hours as needed for nausea or vomiting.   Tiotropium Bromide Monohydrate (SPIRIVA RESPIMAT) 1.25 MCG/ACT AERS    [DISCONTINUED] latanoprost (XALATAN) 0.005 % ophthalmic solution Place 1 drop into both eyes at bedtime.   No facility-administered encounter medications on file as of 04/06/2019.     ALLERGIES:  Allergies  Allergen Reactions   Metoprolol      PHYSICAL EXAM:  ECOG Performance status: 1  Vitals:   04/06/19 1541  BP: (!) 162/66  Pulse: (!) 49  Resp: 18  Temp: 97.6 F (36.4 C)  SpO2: 96%   Filed Weights   04/06/19 1541  Weight: 235 lb 11.2 oz (106.9 kg)    Physical Exam Vitals signs reviewed.  Constitutional:      Appearance: Normal appearance.  Cardiovascular:     Rate and Rhythm: Normal rate and regular rhythm.     Heart sounds: Normal heart sounds.  Pulmonary:     Effort: Pulmonary effort is normal.     Breath sounds: Normal breath sounds.  Abdominal:     General: There is no distension.     Palpations: Abdomen is soft. There is no mass.  Musculoskeletal:        General: Swelling present.  Skin:    General: Skin is warm.  Neurological:     General: No focal deficit present.     Mental Status: He is alert and oriented to person,  place, and time.  Psychiatric:        Mood and Affect: Mood normal.        Behavior: Behavior normal.      LABORATORY DATA:  I have reviewed the labs as listed.  CBC    Component Value Date/Time   WBC 9.4 03/30/2019 1527   RBC 3.95 (L) 03/30/2019 1527   HGB 10.9 (L) 03/30/2019 1527   HCT 35.3 (L) 03/30/2019 1527   PLT 350 03/30/2019 1527   MCV 89.4 03/30/2019 1527   MCH 27.6 03/30/2019 1527   MCHC 30.9 03/30/2019 1527   RDW 14.1 03/30/2019 1527   LYMPHSABS 1.1 03/30/2019 1527   MONOABS 0.8 03/30/2019 1527   EOSABS 0.1 03/30/2019 1527   BASOSABS 0.1 03/30/2019 1527   CMP Latest Ref Rng & Units 03/30/2019 01/27/2019 10/06/2018  Glucose 70 - 99 mg/dL 114(H) 108(H) 134(H)  BUN 8 - 23 mg/dL _0 Creatinine 0.61 - 1.24 mg/dL 1.00 0.87 0.81  Sodium 135 - 145 mmol/L 137 141 141  Potassium 3.5 - 5.1 mmol/L 3.9 4.3 4.1  Chloride 98 - 111 mmol/L 101 105 104  CO2 22 - 32 mmol/L _1 Calcium 8.9 - 10.3 mg/dL 9.5 9.5 9.6  Total Protein 6.5 - 8.1 g/dL 6.3(L) 6.4(L) 6.8  Total Bilirubin 0.3 - 1.2 mg/dL 0.6 0.8 0.8  Alkaline Phos 38 - 126 U/L 45 41 44  AST 15 - 41 U/L 18 16 13(L)  ALT 0 - 44 U/L _2 DIAGNOSTIC IMAGING:  I have independently reviewed the scans and discussed with the patient.   I have reviewed Venita Lick LPN's note and agree with the documentation.  I personally performed a face-to-face visit, made revisions and my  assessment and plan is as follows. 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  Metastatic prostate cancer to the left supraclavicular and mediastinal lymph nodes: °-Docetaxel every 3 weeks for 14 cycles, discontinued as his PSA has plateaued around 11 from 08/21/2015 through 06/12/2016. °- Zytiga and prednisone 5 mg daily started on 07/13/2016. °- He receives Lupron injection at Dr. Gerkin's office in [place]. °-PSA has slowly but steadily increased since November 2019. °- CT CAP on  10/14/2018 shows new small sclerotic lesions in T4 and T10.  But discs are questionable given no uptake on bone scan.  He also received Zometa which could alter the bones.  Bone scan on the same day was negative for any metastatic disease. °-He was reportedly admitted to [facility] in [place] from 12/12/2018 through 12/22/2018 with Covid infection. °-He is continuing to tolerate abiraterone very well.  We reviewed his labs.  His PSA has gone up to 0.93. °-We had a prolonged discussion whether to continue Abiraterone at this time as his PSA level is slowly but steadily rising.  We talked about switching him to enzalutamide. °-We discussed the side effects in detail.  We will start him at 80 mg daily.  I will see him back in 2 to 3 weeks after the start of the pill.  I will slowly titrate up to full dose as tolerated. °-I have told him to taper prednisone over the next 4 weeks. °-I will see him back in 4 weeks for follow-up. ° °2.  Androgen deprivation therapy induced bone loss: °-He received Zometa every 3 months for a year completed on 10/29/2017. °- We plan to repeat bone density in the near future.  He will continue calcium and vitamin D supplements. ° °3.  Fluid overload: °-He is taking Bumex 1 mg daily.  He is also taking potassium twice daily. ° °4.  Genetic testing: °-Maternal uncle had prostate cancer and mother had stomach cancer. °- Germline mutation testing done did not show any mutations. ° °5.  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  He is able to do all his activities. ° ° °Total time spent is 25 minutes with more than 50% of the time spent face-to-face discussing lab results, counseling and coordination of care. ° °Orders placed this encounter:  °Orders Placed This Encounter  °Procedures  °• CBC with Differential/Platelet  °• Comprehensive metabolic panel  °• Magnesium  ° ° ° ° °Sreedhar Katragadda, MD °[facility] °[phone] °  ° °

## 2019-04-06 NOTE — Assessment & Plan Note (Signed)
1.  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 - Zytiga and prednisone 5 mg daily started on 07/13/2016. - He receives Lupron injection at Dr. Gala Lewandowsky office in [place]. -PSA has slowly but steadily increased since November 2019. - CT CAP on 10/14/2018 shows new small sclerotic lesions in T4 and T10.  But discs are questionable given no uptake on bone scan.  He also received Zometa which could alter the bones.  Bone scan on the same day was negative for any metastatic disease. -He was reportedly admitted to [facility] in [place] from 12/12/2018 through 12/22/2018 with Covid infection. -He is continuing to tolerate abiraterone very well.  We reviewed his labs.  His PSA has gone up to 0.93. -We had a prolonged discussion whether to continue Abiraterone at this time as his PSA level is slowly but steadily rising.  We talked about switching him to enzalutamide. -We discussed the side effects in detail.  We will start him at 80 mg daily.  I will see him back in 2 to 3 weeks after the start of the pill.  I will slowly titrate up to full dose as tolerated. -I have told him to taper prednisone over the next 4 weeks. -I will see him back in 4 weeks for follow-up.  2.  Androgen deprivation therapy induced bone loss: -He received Zometa every 3 months for a year completed on 10/29/2017. - We plan to repeat bone density in the near future.  He will continue calcium and vitamin D supplements.  3.  Fluid overload: -He is taking Bumex 1 mg daily.  He is also taking potassium twice daily.  4.  Genetic testing: -Maternal uncle had prostate cancer and mother had stomach cancer. - Germline mutation testing done did not show any mutations.  5.  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  He is able to do all his  activities.

## 2019-04-07 ENCOUNTER — Telehealth (HOSPITAL_COMMUNITY): Payer: Self-pay | Admitting: Pharmacist

## 2019-04-07 MED FILL — XTANDI 40 MG CAPSULE: 40 | 30 days supply | Qty: 120 | Fill #0

## 2019-04-07 NOTE — Telephone Encounter (Signed)
Oral Oncology Pharmacist Encounter   Prior Authorization for Gillermina Phy has been approved. Completed on CMM, key [id number].   PA# [id number] Effective dates: 04/07/2019 through 10/04/2019  Copay [id number]  We will need to apply for manufacturer assistance.  Oral Oncology Clinic will continue to follow.   Darl Pikes, PharmD, BCPS. BCOP Hematology/Oncology Clinical Pharmacist [facility] Oral [facility] [phone]  04/07/2019 11:39 AM

## 2019-04-07 NOTE — Telephone Encounter (Signed)
Oral Oncology Pharmacist Encounter  Received new prescription for Xtandi (enzalutamide) for the treatment of metastatic castrate resistant prostate cancer in conjunction with Lupron, planned duration until disease progression or unacceptable drug toxicity. Patient previously treated with Zytiga and prednisone.  Prescription dose and frequency assessed. Dr. Delton Coombes is starting Andre [name] on 80mg  of Xtandi with the plan of increasing if tolerated.  Current medication list in Epic reviewed, a few DDIs with Andre [name] identified: - Xtandi may decrease the serum concentration of losartan and atorvastatin. Monitor patient for decreased effectiveness of the listed medications. No baseline dose adjustment needed.   Prescription has been e-scribed to the [facility] for benefits analysis and approval.  Oral Oncology Clinic will continue to follow for insurance authorization, copayment issues, initial counseling and start date.  Andre [name], PharmD, BCPS, [facility] Hematology/Oncology Clinical Pharmacist [facility] [phone]  04/07/2019 11:05 AM

## 2019-04-07 NOTE — Telephone Encounter (Signed)
Oral Chemotherapy Pharmacist Encounter  Successfully enrolled patient for copayment assistance funds from [employer] from the metastatic prostate cancer fund.  Award amount: $6,500 Effective dates: 04/07/2019 - 04/06/2020 ID: [id number] BIN: [id number] Group: [id number] PCN: [id number]  Billing information will be shared with [facility]. I will place a copy of the award letter to be scanned into patient's chart.  Darl Pikes, PharmD, BCPS Hematology/Oncology Clinical Pharmacist [facility] [phone]  04/07/2019 2:00 PM

## 2019-04-07 NOTE — Telephone Encounter (Signed)
Pre-authorization approved from insurance for Andre [name] for this pt. Pt's daughter made aware and verbalized understanding.

## 2019-04-11 ENCOUNTER — Telehealth (HOSPITAL_COMMUNITY): Payer: Self-pay | Admitting: *Deleted

## 2019-04-11 NOTE — Telephone Encounter (Signed)
Pt's daughter called into clinic asking should pt stop taking 1/2 tablet of prednisone because he's new medication Gillermina Phy has arrived. Reynolds Bowl, NP stated for pt to stop taking prednisone. Call pt's daughter to let and know and she verbalized understanding.

## 2019-04-11 NOTE — Telephone Encounter (Signed)
Oral Chemotherapy Pharmacist Encounter  Patient Education I spoke with patient's daughter [name] on Friday 11/13 for overview of new oral chemotherapy medication: Xtandi (enzalutamide) for the treatment of metastatic castrate resistant prostate cancer, planned duration until disease progression or unacceptable drug toxicity.    Counseled Stephanie on administration, dosing, side effects, monitoring, drug-food interactions, safe handling, storage, and disposal. Patient will take 80mg  by mouth daily. The dose will be increased by Dr. Delton Coombes if tolerated.  Medication delivered on 11/16. They plan to get started on 11/16.   Side effects include but not limited to: HTN, fatigue, hot flashes, falls.    Reviewed with patient importance of keeping a medication schedule and plan for any missed doses.  [name] voiced understanding and appreciation. All questions answered. Medication handout placed in the mail.   Provided patient with [facility] phone number. Patient knows to call the office with questions or concerns. Oral Chemotherapy Navigation Clinic will continue to follow.  Darl Pikes, PharmD, BCPS, [facility] Hematology/Oncology Clinical Pharmacist [facility] [phone]  04/11/2019 10:47 AM

## 2019-05-02 ENCOUNTER — Other Ambulatory Visit (HOSPITAL_COMMUNITY): Payer: Self-pay | Admitting: Hematology

## 2019-05-02 DIAGNOSIS — C61 Malignant neoplasm of prostate: Secondary | ICD-10-CM

## 2019-05-04 ENCOUNTER — Inpatient Hospital Stay (HOSPITAL_COMMUNITY): Payer: Medicare PPO | Attending: Hematology | Admitting: Hematology

## 2019-05-04 ENCOUNTER — Inpatient Hospital Stay (HOSPITAL_COMMUNITY): Payer: Medicare PPO

## 2019-05-04 ENCOUNTER — Other Ambulatory Visit: Payer: Self-pay

## 2019-05-04 ENCOUNTER — Encounter (HOSPITAL_COMMUNITY): Payer: Self-pay | Admitting: Hematology

## 2019-05-04 VITALS — BP 175/88 | HR 66 | Temp 97.3°F | Resp 20 | Wt 237.9 lb

## 2019-05-04 DIAGNOSIS — E119 Type 2 diabetes mellitus without complications: Secondary | ICD-10-CM | POA: Insufficient documentation

## 2019-05-04 DIAGNOSIS — I10 Essential (primary) hypertension: Secondary | ICD-10-CM | POA: Insufficient documentation

## 2019-05-04 DIAGNOSIS — C771 Secondary and unspecified malignant neoplasm of intrathoracic lymph nodes: Secondary | ICD-10-CM | POA: Diagnosis not present

## 2019-05-04 DIAGNOSIS — Z87891 Personal history of nicotine dependence: Secondary | ICD-10-CM | POA: Insufficient documentation

## 2019-05-04 DIAGNOSIS — Z9221 Personal history of antineoplastic chemotherapy: Secondary | ICD-10-CM | POA: Diagnosis not present

## 2019-05-04 DIAGNOSIS — J449 Chronic obstructive pulmonary disease, unspecified: Secondary | ICD-10-CM | POA: Insufficient documentation

## 2019-05-04 DIAGNOSIS — E8770 Fluid overload, unspecified: Secondary | ICD-10-CM | POA: Insufficient documentation

## 2019-05-04 DIAGNOSIS — Z8041 Family history of malignant neoplasm of ovary: Secondary | ICD-10-CM | POA: Diagnosis not present

## 2019-05-04 DIAGNOSIS — C770 Secondary and unspecified malignant neoplasm of lymph nodes of head, face and neck: Secondary | ICD-10-CM | POA: Diagnosis not present

## 2019-05-04 DIAGNOSIS — Z7984 Long term (current) use of oral hypoglycemic drugs: Secondary | ICD-10-CM | POA: Diagnosis not present

## 2019-05-04 DIAGNOSIS — Z7982 Long term (current) use of aspirin: Secondary | ICD-10-CM | POA: Diagnosis not present

## 2019-05-04 DIAGNOSIS — C61 Malignant neoplasm of prostate: Secondary | ICD-10-CM

## 2019-05-04 DIAGNOSIS — Z79899 Other long term (current) drug therapy: Secondary | ICD-10-CM | POA: Diagnosis not present

## 2019-05-04 DIAGNOSIS — G629 Polyneuropathy, unspecified: Secondary | ICD-10-CM | POA: Insufficient documentation

## 2019-05-04 DIAGNOSIS — Z8042 Family history of malignant neoplasm of prostate: Secondary | ICD-10-CM | POA: Diagnosis not present

## 2019-05-04 LAB — CBC WITH DIFFERENTIAL/PLATELET
Abs Immature Granulocytes: 0.03 10*3/uL (ref 0.00–0.07)
Basophils Absolute: 0.1 10*3/uL (ref 0.0–0.1)
Basophils Relative: 1 %
Eosinophils Absolute: 0.3 10*3/uL (ref 0.0–0.5)
Eosinophils Relative: 3 %
HCT: 36.7 % — ABNORMAL LOW (ref 39.0–52.0)
Hemoglobin: 11.2 g/dL — ABNORMAL LOW (ref 13.0–17.0)
Immature Granulocytes: 0 %
Lymphocytes Relative: 9 %
Lymphs Abs: 0.9 10*3/uL (ref 0.7–4.0)
MCH: 27 pg (ref 26.0–34.0)
MCHC: 30.5 g/dL (ref 30.0–36.0)
MCV: 88.4 fL (ref 80.0–100.0)
Monocytes Absolute: 1 10*3/uL (ref 0.1–1.0)
Monocytes Relative: 10 %
Neutro Abs: 7.4 10*3/uL (ref 1.7–7.7)
Neutrophils Relative %: 77 %
Platelets: 396 10*3/uL (ref 150–400)
RBC: 4.15 MIL/uL — ABNORMAL LOW (ref 4.22–5.81)
RDW: 14 % (ref 11.5–15.5)
WBC: 9.7 10*3/uL (ref 4.0–10.5)
nRBC: 0 % (ref 0.0–0.2)

## 2019-05-04 LAB — COMPREHENSIVE METABOLIC PANEL
ALT: 9 U/L (ref 0–44)
AST: 17 U/L (ref 15–41)
Albumin: 3.6 g/dL (ref 3.5–5.0)
Alkaline Phosphatase: 41 U/L (ref 38–126)
Anion gap: 11 (ref 5–15)
BUN: 11 mg/dL (ref 8–23)
CO2: 27 mmol/L (ref 22–32)
Calcium: 9.7 mg/dL (ref 8.9–10.3)
Chloride: 104 mmol/L (ref 98–111)
Creatinine, Ser: 0.85 mg/dL (ref 0.61–1.24)
GFR calc Af Amer: >60 mL/min (ref >60)
GFR calc non Af Amer: >60 mL/min (ref >60)
Glucose, Bld: 97 mg/dL (ref 70–99)
Potassium: 4.6 mmol/L (ref 3.5–5.1)
Sodium: 142 mmol/L (ref 135–145)
Total Bilirubin: 0.6 mg/dL (ref 0.3–1.2)
Total Protein: 6.6 g/dL (ref 6.5–8.1)

## 2019-05-04 LAB — MAGNESIUM: Magnesium: 1.8 mg/dL (ref 1.7–2.4)

## 2019-05-04 MED ORDER — AMLODIPINE BESYLATE 2.5 MG PO TABS: 2.5000 mg | ORAL_TABLET | Freq: Every day | ORAL | 0 refills | Status: DC | PRN

## 2019-05-04 NOTE — Progress Notes (Signed)
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  The Common Hereditary Gene Panel offered by [facility] includes sequencing and/or deletion duplication testing of the following 48 genes: APC, ATM, AXIN2, BARD1, BMPR1A, BRCA1, BRCA2, BRIP1, CDH1, CDK4, CDKN2A (p14ARF), CDKN2A (p16INK4a), CHEK2, CTNNA1, DICER1, EPCAM (Deletion/duplication testing only), GREM1 (promoter region deletion/duplication testing only), KIT, MEN1, MLH1, MSH2, MSH3, MSH6, MUTYH, NBN, NF1, NHTL1, PALB2, PDGFRA, PMS2, POLD1, POLE, PTEN, RAD50, RAD51C, RAD51D, RNF43, SDHB, SDHC, SDHD, SMAD4, SMARCA4. STK11, TP53, TSC1, TSC2, and VHL.  The following genes were evaluated for sequence changes only: SDHA and HOXB13 c.251G>A variant only. The report date is Oct 14, 2018.      CANCER STAGING: Cancer Staging No matching staging information was found for the patient.   INTERVAL HISTORY:  Mr. Haberland 78 y.o. male seen for follow-up of metastatic prostate cancer to the lymph nodes.  He started taking enzalutamide on 04/12/2019 at a dose of 80 mg daily.  He  reported diarrhea about 2 to 3 days which stopped.  He reports some nausea.  He is taking enzalutamide prior to breakfast.  Denies any severe tiredness.  Noted some discomfort in the left upper extremity.  He is taking losartan 100 mg daily for blood pressure.  Appetite and energy levels are 100%.  No pains reported.  Chronic leg swelling has been stable.    REVIEW OF SYSTEMS:  Review of Systems  Gastrointestinal: Positive for diarrhea.  All other systems reviewed and are negative.    PAST MEDICAL/SURGICAL HISTORY:  Past Medical History:  Diagnosis Date  . Cancer [facility])    prostate  . COPD (chronic obstructive pulmonary disease) ([place])   . Diabetes mellitus without complication ([place])   . Family history of ovarian cancer   . Family history of prostate cancer   . Hypertension    Past Surgical History:  Procedure Laterality Date  . GOLD SEED IMPLANT       SOCIAL HISTORY:  Social History   Socioeconomic History  . Marital status: Widowed    Spouse name: Not on file  . Number of children: Not on file  . Years of education: Not on file  . Highest education level: Not on file  Occupational History  . Not on file  Tobacco Use  . Smoking status: Former Smoker    Types: Cigarettes    Quit date: 06/25/2014    Years since quitting: 4.8  . Smokeless tobacco: Never Used  . Tobacco comment: smoked since young age, quit in 2016  Substance and Sexual Activity  . Alcohol use: Not Currently  . Drug use: No  . Sexual activity: Not on file  Other Topics Concern  .  Not on file  Social History Narrative  . Not on file   Social Determinants of Health   Financial Resource Strain:   . Difficulty of Paying Living Expenses: Not on file  Food Insecurity:   . Worried About Charity fundraiser in the Last Year: Not on file  . Ran Out of Food in the Last Year: Not on file  Transportation Needs:   . Lack of Transportation (Medical): Not on file  . Lack of Transportation (Non-Medical): Not  on file  Physical Activity:   . Days of Exercise per Week: Not on file  . Minutes of Exercise per Session: Not on file  Stress:   . Feeling of Stress : Not on file  Social Connections:   . Frequency of Communication with Friends and Family: Not on file  . Frequency of Social Gatherings with Friends and Family: Not on file  . Attends Religious Services: Not on file  . Active Member of Clubs or Organizations: Not on file  . Attends Archivist Meetings: Not on file  . Marital Status: Not on file  Intimate Partner Violence:   . Fear of Current or Ex-Partner: Not on file  . Emotionally Abused: Not on file  . Physically Abused: Not on file  . Sexually Abused: Not on file    FAMILY HISTORY:  Family History  Problem Relation Age of Onset  . Ovarian cancer Mother 88  . Heart attack Father   . Prostate cancer Maternal Uncle 67  . Cancer Cousin        pat cousin with unknown cancer    CURRENT MEDICATIONS:  Outpatient Encounter Medications as of 05/04/2019  Medication Sig  . aspirin EC 81 MG tablet Take 81 mg by mouth daily.  Marland Kitchen atorvastatin (LIPITOR) 10 MG tablet Take 10 mg by mouth at bedtime.  . brimonidine (ALPHAGAN) 0.2 % ophthalmic solution Place 1 drop into both eyes 3 (three) times daily.  . bumetanide (BUMEX) 1 MG tablet Take 1 tablet (1 mg total) by mouth daily.  Marland Kitchen CALCIUM PO Take 600 mg by mouth daily.   . cetirizine (ZYRTEC) 10 MG tablet Take 10 mg by mouth daily.  . Cholecalciferol (VITAMIN D-3 PO) Take 1 tablet by mouth daily.  Marland Kitchen dicyclomine (BENTYL) 10 MG capsule Take 10 mg by mouth daily.  . dorzolamide (TRUSOPT) 2 % ophthalmic solution Place 1 drop into both eyes 3 (three) times daily.  . ferrous sulfate 325 (65 FE) MG tablet Take 325 mg by mouth daily with breakfast.  . Latanoprostene Bunod (VYZULTA) 0.024 % SOLN Apply 1 drop to eye 1 day or 1 dose.  . losartan (COZAAR) 100 MG tablet Take 100 mg by mouth daily.  . magnesium oxide (MAG-OX) 400 MG tablet Take  1 tablet (400 mg total) by mouth every other day.  . metFORMIN (GLUCOPHAGE) 500 MG tablet Take 1,000 mg by mouth 2 (two) times daily with a meal.   . potassium chloride SA (K-DUR) 20 MEQ tablet TAKE 2 TABLETS BY MOUTH EVERY DAY  . TRELEGY ELLIPTA 100-62.5-25 MCG/INH AEPB INHALE 1 PUFF ONCE DAILY  . vitamin C (ASCORBIC ACID) 500 MG tablet Take 500 mg by mouth daily.  Gillermina Phy 40 MG capsule TAKE 4 CAPSULES (160 MG TOTAL) BY MOUTH DAILY.  Marland Kitchen acetaminophen (TYLENOL) 500 MG tablet Take 500 mg by mouth every 6 (six) hours as needed for moderate pain.  Marland Kitchen amLODipine (NORVASC) 2.5 MG tablet Take 1 tablet (2.5 mg total) by mouth  daily as needed.  Marland Kitchen CHERATUSSIN AC 100-10 MG/5ML syrup Take 5 mLs by mouth daily as needed.   . cyclobenzaprine (FLEXERIL) 10 MG tablet Take 10 mg by mouth as needed.   . fluticasone (FLONASE) 50 MCG/ACT nasal spray USE 2 SPRAY(S) IN EACH NOSTRIL ONCE DAILY FOR 30 DAYS  . ibuprofen (ADVIL,MOTRIN) 600 MG tablet Take 600 mg by mouth every 6 (six) hours as needed.  . prochlorperazine (COMPAZINE) 10 MG tablet Take 10 mg by mouth every 6 (six) hours as needed for nausea or vomiting.  . Tiotropium Bromide Monohydrate (SPIRIVA RESPIMAT) 1.25 MCG/ACT AERS   . [DISCONTINUED] predniSONE (DELTASONE) 5 MG tablet Take 1 tablet (5 mg total) by mouth daily with breakfast.   No facility-administered encounter medications on file as of 05/04/2019.    ALLERGIES:  Allergies  Allergen Reactions  . Metoprolol      PHYSICAL EXAM:  ECOG Performance status: 1  Vitals:   05/04/19 1545  BP: (!) 175/88  Pulse: 66  Resp: 20  Temp: (!) 97.3 F (36.3 C)  SpO2: 93%   Filed Weights   05/04/19 1545  Weight: 237 lb 14.4 oz (107.9 kg)    Physical Exam Vitals reviewed.  Constitutional:      Appearance: Normal appearance.  Cardiovascular:     Rate and Rhythm: Normal rate and regular rhythm.     Heart sounds: Normal heart sounds.  Pulmonary:     Effort: Pulmonary effort is normal.      Breath sounds: Normal breath sounds.  Abdominal:     General: There is no distension.     Palpations: Abdomen is soft. There is no mass.  Musculoskeletal:     Right lower leg: Edema present.     Left lower leg: Edema present.  Skin:    General: Skin is warm.  Neurological:     General: No focal deficit present.     Mental Status: He is alert and oriented to person, place, and time.  Psychiatric:        Mood and Affect: Mood normal.        Behavior: Behavior normal.      LABORATORY DATA:  I have reviewed the labs as listed.  CBC    Component Value Date/Time   WBC 9.7 05/04/2019 1416   RBC 4.15 (L) 05/04/2019 1416   HGB 11.2 (L) 05/04/2019 1416   HCT 36.7 (L) 05/04/2019 1416   PLT 396 05/04/2019 1416   MCV 88.4 05/04/2019 1416   MCH 27.0 05/04/2019 1416   MCHC 30.5 05/04/2019 1416   RDW 14.0 05/04/2019 1416   LYMPHSABS 0.9 05/04/2019 1416   MONOABS 1.0 05/04/2019 1416   EOSABS 0.3 05/04/2019 1416   BASOSABS 0.1 05/04/2019 1416   CMP Latest Ref Rng & Units 05/04/2019 03/30/2019 01/27/2019  Glucose 70 - 99 mg/dL 97 114(H) 108(H)  BUN 8 - 23 mg/dL 11 16 13   Creatinine 0.61 - 1.24 mg/dL 0.85 1.00 0.87  Sodium 135 - 145 mmol/L 142 137 141  Potassium 3.5 - 5.1 mmol/L 4.6 3.9 4.3  Chloride 98 - 111 mmol/L 104 101 105  CO2 22 - 32 mmol/L 27 26 27   Calcium 8.9 - 10.3 mg/dL 9.7 9.5 9.5  Total Protein 6.5 - 8.1 g/dL 6.6 6.3(L) 6.4(L)  Total Bilirubin 0.3 - 1.2 mg/dL 0.6 0.6 0.8  Alkaline Phos 38 - 126 U/L 41 45 41  AST 15 - 41 U/L 17 18 16   ALT 0 - 44 U/L 9 9 8  DIAGNOSTIC IMAGING:  I have independently reviewed the scans and discussed with the patient.   I have reviewed Venita Lick LPN's note and agree with the documentation.  I personally performed a face-to-face visit, made revisions and my assessment and plan is as follows.    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  Metastatic prostate cancer to the left  supraclavicular and mediastinal lymph nodes: -Docetaxel every 3 weeks for 14 cycles, discontinued as his PSA has plateaued around 11 from 08/21/2015 through 06/12/2016. - Zytiga and prednisone 5 mg daily started on 07/13/2016. - He receives Lupron injection at Dr. Gala Lewandowsky office in [place]. -PSA has slowly but steadily increased since November 2019. - CT CAP on 10/14/2018 shows new small sclerotic lesions in T4 and T10.  But discs are questionable given no uptake on bone scan.  He also received Zometa which could alter the bones.  Bone scan on the same day was negative for any metastatic disease. -He was reportedly admitted to [facility] in [place] from 12/12/2018 through 12/22/2018 with Covid infection. -Abiraterone was discontinued around 04/09/2019. -Enzalutamide 80 mg daily started on 04/12/2019.  He had a couple of days of diarrhea which stopped.  He reported some discomfort in the left upper extremity on and off. -He also reports some nausea.  I have suggested him to take enzalutamide with food. -I will increase enzalutamide to 120 mg daily.  I have reviewed his blood work which is grossly within normal limits.  We will plan to check PSA at least 2 months from the start. -We will see him back in 4 weeks with repeat labs on the same day.  2.  Androgen deprivation therapy induced bone loss: -He received Zometa every 3 months for a year completed on 10/29/2017. - We plan to repeat bone density in the near future.  He will continue calcium and vitamin D supplements.  3.  Fluid overload: -He is continuing Bumex 1 mg daily.  He is also taking potassium daily.  4.  Genetic testing: -Maternal uncle had prostate cancer and mother had stomach cancer. - Germline mutation testing done did not show any mutations.  5.  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  He is able to do all his activities.  6.  Hypertension: -He is on losartan 100 mg daily.  His systolic blood  pressure today is 175.  I have reviewed home blood pressure measurements. -Most of the time systolic is below 158. -I have given a prescription for Norvasc 2.5 mg to be taken as needed if the systolic blood pressure goes up above 150.    Orders placed this encounter:  Orders Placed This Encounter  Procedures  . Comprehensive metabolic panel  . CBC with Differential  . Magnesium      Derek Jack, MD [place] [phone]

## 2019-05-04 NOTE — Assessment & Plan Note (Signed)
1.  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 - Zytiga and prednisone 5 mg daily started on 07/13/2016. - He receives Lupron injection at Dr. Gala Lewandowsky office in [place]. -PSA has slowly but steadily increased since November 2019. - CT CAP on 10/14/2018 shows new small sclerotic lesions in T4 and T10.  But discs are questionable given no uptake on bone scan.  He also received Zometa which could alter the bones.  Bone scan on the same day was negative for any metastatic disease. -He was reportedly admitted to [facility] in [place] from 12/12/2018 through 12/22/2018 with Covid infection. -Abiraterone was discontinued around 04/09/2019. -Enzalutamide 80 mg daily started on 04/12/2019.  He had a couple of days of diarrhea which stopped.  He reported some discomfort in the left upper extremity on and off. -He also reports some nausea.  I have suggested him to take enzalutamide with food. -I will increase enzalutamide to 120 mg daily.  I have reviewed his blood work which is grossly within normal limits.  We will plan to check PSA at least 2 months from the start. -We will see him back in 4 weeks with repeat labs on the same day.  2.  Androgen deprivation therapy induced bone loss: -He received Zometa every 3 months for a year completed on 10/29/2017. - We plan to repeat bone density in the near future.  He will continue calcium and vitamin D supplements.  3.  Fluid overload: -He is continuing Bumex 1 mg daily.  He is also taking potassium daily.  4.  Genetic testing: -Maternal uncle had prostate cancer and mother had stomach cancer. - Germline mutation testing done did not show any mutations.  5.  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  He is able to do all his activities.  6.  Hypertension: -He is on  losartan 100 mg daily.  His systolic blood pressure today is 175.  I have reviewed home blood pressure measurements. -Most of the time systolic is below [id number]. -I have given a prescription for Norvasc 2.5 mg to be taken as needed if the systolic blood pressure goes up above 150.

## 2019-05-04 NOTE — Patient Instructions (Signed)
[facility] Discharge Instructions  You were seen today by Dr. Katragadda. He went over your recent results. He will see you back in for labs and follow up.   Thank you for choosing [facility] at [facility] to provide your oncology and hematology care.  To afford each patient quality time with our provider, please arrive at least 15 minutes before your scheduled appointment time.   If you have a lab appointment with the Cancer Center please come in thru the  Main Entrance and check in at the main information desk  You need to re-schedule your appointment should you arrive 10 or more minutes late.  We strive to give you quality time with our providers, and arriving late affects you and other patients whose appointments are after yours.  Also, if you no show three or more times for appointments you may be dismissed from the clinic at the providers discretion.     Again, thank you for choosing [facility].  Our hope is that these requests will decrease the amount of time that you wait before being seen by our physicians.       _____________________________________________________________  Should you have questions after your visit to  Cancer Center, please contact our office at [phone] between the hours of 8:00 a.m. and 4:30 p.m.  Voicemails left after 4:00 p.m. will not be returned until the following business day.  For prescription refill requests, have your pharmacy contact our office and allow 72 hours.    Cancer Center Support Programs:   > Cancer Support Group  2nd Tuesday of the month 1pm-2pm, Journey Room    

## 2019-05-11 MED FILL — XTANDI 40 MG CAPSULE: 40 | 30 days supply | Qty: 120 | Fill #0

## 2019-05-30 ENCOUNTER — Telehealth (HOSPITAL_COMMUNITY): Payer: Self-pay | Admitting: Pharmacy Technician

## 2019-05-30 NOTE — Telephone Encounter (Signed)
Oral Oncology Patient Advocate Encounter  Patient has been approved for copay assistance with [employer] (TAF).  [employer] will cover all copayment expenses for Xtandi for the remainder of the calendar year.    The billing information is as follows and has been shared with [place].   Member ID: [id number] Group ID: [id number] PCN: [id number] BIN: [id number] Eligibility Dates: 05/26/2019 to 05/24/2020  Fund: [place] Patient [place] Phone [phone] Fax [phone] 05/30/2019 12:13 PM

## 2019-05-31 ENCOUNTER — Encounter (HOSPITAL_COMMUNITY): Payer: Self-pay | Admitting: Hematology

## 2019-05-31 ENCOUNTER — Inpatient Hospital Stay (HOSPITAL_COMMUNITY): Payer: Medicare PPO | Attending: Hematology | Admitting: Hematology

## 2019-05-31 ENCOUNTER — Other Ambulatory Visit: Payer: Self-pay

## 2019-05-31 ENCOUNTER — Inpatient Hospital Stay (HOSPITAL_COMMUNITY): Payer: Medicare PPO

## 2019-05-31 VITALS — BP 147/78 | HR 65 | Temp 97.4°F | Resp 16 | Wt 235.9 lb

## 2019-05-31 DIAGNOSIS — Z8041 Family history of malignant neoplasm of ovary: Secondary | ICD-10-CM | POA: Diagnosis not present

## 2019-05-31 DIAGNOSIS — E119 Type 2 diabetes mellitus without complications: Secondary | ICD-10-CM | POA: Diagnosis not present

## 2019-05-31 DIAGNOSIS — Z7982 Long term (current) use of aspirin: Secondary | ICD-10-CM | POA: Insufficient documentation

## 2019-05-31 DIAGNOSIS — Z79899 Other long term (current) drug therapy: Secondary | ICD-10-CM | POA: Diagnosis not present

## 2019-05-31 DIAGNOSIS — Z923 Personal history of irradiation: Secondary | ICD-10-CM | POA: Diagnosis not present

## 2019-05-31 DIAGNOSIS — C771 Secondary and unspecified malignant neoplasm of intrathoracic lymph nodes: Secondary | ICD-10-CM | POA: Diagnosis not present

## 2019-05-31 DIAGNOSIS — C61 Malignant neoplasm of prostate: Secondary | ICD-10-CM

## 2019-05-31 DIAGNOSIS — J449 Chronic obstructive pulmonary disease, unspecified: Secondary | ICD-10-CM | POA: Insufficient documentation

## 2019-05-31 DIAGNOSIS — Z87891 Personal history of nicotine dependence: Secondary | ICD-10-CM | POA: Insufficient documentation

## 2019-05-31 DIAGNOSIS — G629 Polyneuropathy, unspecified: Secondary | ICD-10-CM | POA: Diagnosis not present

## 2019-05-31 DIAGNOSIS — I10 Essential (primary) hypertension: Secondary | ICD-10-CM | POA: Diagnosis not present

## 2019-05-31 DIAGNOSIS — C770 Secondary and unspecified malignant neoplasm of lymph nodes of head, face and neck: Secondary | ICD-10-CM | POA: Insufficient documentation

## 2019-05-31 DIAGNOSIS — Z8042 Family history of malignant neoplasm of prostate: Secondary | ICD-10-CM | POA: Insufficient documentation

## 2019-05-31 DIAGNOSIS — Z9221 Personal history of antineoplastic chemotherapy: Secondary | ICD-10-CM | POA: Insufficient documentation

## 2019-05-31 LAB — CBC WITH DIFFERENTIAL/PLATELET
Abs Immature Granulocytes: 0.04 10*3/uL (ref 0.00–0.07)
Basophils Absolute: 0.1 10*3/uL (ref 0.0–0.1)
Basophils Relative: 1 %
Eosinophils Absolute: 0.3 10*3/uL (ref 0.0–0.5)
Eosinophils Relative: 3 %
HCT: 37.8 % — ABNORMAL LOW (ref 39.0–52.0)
Hemoglobin: 11.8 g/dL — ABNORMAL LOW (ref 13.0–17.0)
Immature Granulocytes: 0 %
Lymphocytes Relative: 12 %
Lymphs Abs: 1.2 10*3/uL (ref 0.7–4.0)
MCH: 27.1 pg (ref 26.0–34.0)
MCHC: 31.2 g/dL (ref 30.0–36.0)
MCV: 86.7 fL (ref 80.0–100.0)
Monocytes Absolute: 1.1 10*3/uL — ABNORMAL HIGH (ref 0.1–1.0)
Monocytes Relative: 11 %
Neutro Abs: 7.4 10*3/uL (ref 1.7–7.7)
Neutrophils Relative %: 73 %
Platelets: 342 10*3/uL (ref 150–400)
RBC: 4.36 MIL/uL (ref 4.22–5.81)
RDW: 13.8 % (ref 11.5–15.5)
WBC: 10 10*3/uL (ref 4.0–10.5)
nRBC: 0 % (ref 0.0–0.2)

## 2019-05-31 LAB — COMPREHENSIVE METABOLIC PANEL
ALT: 8 U/L (ref 0–44)
AST: 14 U/L — ABNORMAL LOW (ref 15–41)
Albumin: 3.6 g/dL (ref 3.5–5.0)
Alkaline Phosphatase: 45 U/L (ref 38–126)
Anion gap: 9 (ref 5–15)
BUN: 13 mg/dL (ref 8–23)
CO2: 28 mmol/L (ref 22–32)
Calcium: 9.4 mg/dL (ref 8.9–10.3)
Chloride: 101 mmol/L (ref 98–111)
Creatinine, Ser: 0.89 mg/dL (ref 0.61–1.24)
GFR calc Af Amer: >60 mL/min (ref >60)
GFR calc non Af Amer: >60 mL/min (ref >60)
Glucose, Bld: 113 mg/dL — ABNORMAL HIGH (ref 70–99)
Potassium: 4.5 mmol/L (ref 3.5–5.1)
Sodium: 138 mmol/L (ref 135–145)
Total Bilirubin: 0.6 mg/dL (ref 0.3–1.2)
Total Protein: 6.4 g/dL — ABNORMAL LOW (ref 6.5–8.1)

## 2019-05-31 LAB — MAGNESIUM: Magnesium: 1.9 mg/dL (ref 1.7–2.4)

## 2019-05-31 NOTE — Progress Notes (Signed)
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  The Common Hereditary Gene Panel offered by [facility] includes sequencing and/or deletion duplication testing of the following 48 genes: APC, ATM, AXIN2, BARD1, BMPR1A, BRCA1, BRCA2, BRIP1, CDH1, CDK4, CDKN2A (p14ARF), CDKN2A (p16INK4a), CHEK2, CTNNA1, DICER1, EPCAM (Deletion/duplication testing only), GREM1 (promoter region deletion/duplication testing only), KIT, MEN1, MLH1, MSH2, MSH3, MSH6, MUTYH, NBN, NF1, NHTL1, PALB2, PDGFRA, PMS2, POLD1, POLE, PTEN, RAD50, RAD51C, RAD51D, RNF43, SDHB, SDHC, SDHD, SMAD4, SMARCA4. STK11, TP53, TSC1, TSC2, and VHL.  The following genes were evaluated for sequence changes only: SDHA and HOXB13 c.251G>A variant only. The report date is Oct 14, 2018.      CANCER STAGING: Cancer Staging No matching staging information was found for the patient.   INTERVAL HISTORY:  Andre [name] 79 y.o. male seen for follow-up of metastatic prostate cancer to the lymph nodes.  He has not noticed any nausea or vomiting.  He has loose bowel movements at  times which are stable.  Leg swelling in the feet has been stable.  Shortness of breath on exertion is also stable.  Appetite is 100%.  Energy levels are 75%.    REVIEW OF SYSTEMS:  Review of Systems  Gastrointestinal: Positive for diarrhea.  All other systems reviewed and are negative.    PAST MEDICAL/SURGICAL HISTORY:  Past Medical History:  Diagnosis Date  . Cancer [facility])    prostate  . COPD (chronic obstructive pulmonary disease) ([place])   . Diabetes mellitus without complication ([place])   . Family history of ovarian cancer   . Family history of prostate cancer   . Hypertension    Past Surgical History:  Procedure Laterality Date  . GOLD SEED IMPLANT       SOCIAL HISTORY:  Social History   Socioeconomic History  . Marital status: Widowed    Spouse name: Not on file  . Number of children: Not on file  . Years of education: Not on file  . Highest education level: Not on file  Occupational History  . Not on file  Tobacco Use  . Smoking status: Former Smoker    Types: Cigarettes    Quit date: 06/25/2014    Years since quitting: 4.9  . Smokeless tobacco: Never Used  . Tobacco comment: smoked since young age, quit in 2016  Substance and Sexual Activity  . Alcohol use: Not Currently  . Drug use: No  . Sexual activity: Not on file  Other Topics Concern  . Not on file  Social History Narrative  . Not on file   Social Determinants of Health   Financial Resource Strain:   . Difficulty of Paying Living Expenses: Not  on file  Food Insecurity:   . Worried About Charity fundraiser in the Last Year: Not on file  . Ran Out of Food in the Last Year: Not on file  Transportation Needs:   . Lack of Transportation (Medical): Not on file  . Lack of Transportation (Non-Medical): Not on file  Physical Activity:   . Days of Exercise per Week: Not on file  . Minutes of Exercise per Session: Not on file  Stress:   . Feeling of Stress : Not on file  Social Connections:   .  Frequency of Communication with Friends and Family: Not on file  . Frequency of Social Gatherings with Friends and Family: Not on file  . Attends Religious Services: Not on file  . Active Member of Clubs or Organizations: Not on file  . Attends Archivist Meetings: Not on file  . Marital Status: Not on file  Intimate Partner Violence:   . Fear of Current or Ex-Partner: Not on file  . Emotionally Abused: Not on file  . Physically Abused: Not on file  . Sexually Abused: Not on file    FAMILY HISTORY:  Family History  Problem Relation Age of Onset  . Ovarian cancer Mother 62  . Heart attack Father   . Prostate cancer Maternal Uncle 10  . Cancer Cousin        pat cousin with unknown cancer    CURRENT MEDICATIONS:  Outpatient Encounter Medications as of 05/31/2019  Medication Sig  . acetaminophen (TYLENOL) 500 MG tablet Take 500 mg by mouth every 6 (six) hours as needed for moderate pain.  Marland Kitchen amLODipine (NORVASC) 2.5 MG tablet Take 1 tablet (2.5 mg total) by mouth daily as needed.  Marland Kitchen aspirin EC 81 MG tablet Take 81 mg by mouth daily.  Marland Kitchen atorvastatin (LIPITOR) 10 MG tablet Take 10 mg by mouth at bedtime.  . brimonidine (ALPHAGAN) 0.2 % ophthalmic solution Place 1 drop into both eyes 3 (three) times daily.  . bumetanide (BUMEX) 1 MG tablet Take 1 tablet (1 mg total) by mouth daily.  Marland Kitchen CALCIUM PO Take 600 mg by mouth daily.   . cetirizine (ZYRTEC) 10 MG tablet Take 10 mg by mouth daily.  Marland Kitchen CHERATUSSIN AC 100-10 MG/5ML syrup Take 5 mLs by mouth daily as needed.   . Cholecalciferol (VITAMIN D-3 PO) Take 1 tablet by mouth daily.  . cyclobenzaprine (FLEXERIL) 10 MG tablet Take 10 mg by mouth as needed.   . dicyclomine (BENTYL) 10 MG capsule Take 10 mg by mouth daily.  . dorzolamide (TRUSOPT) 2 % ophthalmic solution Place 1 drop into both eyes 3 (three) times daily.  . ferrous sulfate 325 (65 FE) MG tablet Take 325 mg by mouth daily with breakfast.  . fluticasone (FLONASE) 50  MCG/ACT nasal spray USE 2 SPRAY(S) IN EACH NOSTRIL ONCE DAILY FOR 30 DAYS  . ibuprofen (ADVIL,MOTRIN) 600 MG tablet Take 600 mg by mouth every 6 (six) hours as needed.  . Latanoprostene Bunod (VYZULTA) 0.024 % SOLN Apply 1 drop to eye 1 day or 1 dose.  . losartan (COZAAR) 100 MG tablet Take 100 mg by mouth daily.  . magnesium oxide (MAG-OX) 400 MG tablet Take 1 tablet (400 mg total) by mouth every other day.  . metFORMIN (GLUCOPHAGE) 500 MG tablet Take 1,000 mg by mouth 2 (two) times daily with a meal.   . potassium chloride (MICRO-K) 10 MEQ CR capsule Take 40 mEq by mouth daily.   . prochlorperazine (  COMPAZINE) 10 MG tablet Take 10 mg by mouth every 6 (six) hours as needed for nausea or vomiting.  . Tiotropium Bromide Monohydrate (SPIRIVA RESPIMAT) 1.25 MCG/ACT AERS   . TRELEGY ELLIPTA 100-62.5-25 MCG/INH AEPB INHALE 1 PUFF ONCE DAILY  . vitamin C (ASCORBIC ACID) 500 MG tablet Take 500 mg by mouth daily.  Gillermina Phy 40 MG capsule TAKE 4 CAPSULES (160 MG TOTAL) BY MOUTH DAILY.  . [DISCONTINUED] potassium chloride SA (K-DUR) 20 MEQ tablet TAKE 2 TABLETS BY MOUTH EVERY DAY (Patient not taking: Reported on 05/31/2019)   No facility-administered encounter medications on file as of 05/31/2019.    ALLERGIES:  Allergies  Allergen Reactions  . Metoprolol      PHYSICAL EXAM:  ECOG Performance status: 1  Vitals:   05/31/19 1538  BP: (!) 147/78  Pulse: 65  Resp: 16  Temp: (!) 97.4 F (36.3 C)  SpO2: 99%   Filed Weights   05/31/19 1538  Weight: 235 lb 14.4 oz (107 kg)    Physical Exam Vitals reviewed.  Constitutional:      Appearance: Normal appearance.  Cardiovascular:     Rate and Rhythm: Normal rate and regular rhythm.     Heart sounds: Normal heart sounds.  Pulmonary:     Effort: Pulmonary effort is normal.     Breath sounds: Normal breath sounds.  Abdominal:     General: There is no distension.     Palpations: Abdomen is soft. There is no mass.  Musculoskeletal:     Right  lower leg: Edema present.     Left lower leg: Edema present.  Skin:    General: Skin is warm.  Neurological:     General: No focal deficit present.     Mental Status: He is alert and oriented to person, place, and time.  Psychiatric:        Mood and Affect: Mood normal.        Behavior: Behavior normal.      LABORATORY DATA:  I have reviewed the labs as listed.  CBC    Component Value Date/Time   WBC 10.0 05/31/2019 1415   RBC 4.36 05/31/2019 1415   HGB 11.8 (L) 05/31/2019 1415   HCT 37.8 (L) 05/31/2019 1415   PLT 342 05/31/2019 1415   MCV 86.7 05/31/2019 1415   MCH 27.1 05/31/2019 1415   MCHC 31.2 05/31/2019 1415   RDW 13.8 05/31/2019 1415   LYMPHSABS 1.2 05/31/2019 1415   MONOABS 1.1 (H) 05/31/2019 1415   EOSABS 0.3 05/31/2019 1415   BASOSABS 0.1 05/31/2019 1415   CMP Latest Ref Rng & Units 05/31/2019 05/04/2019 03/30/2019  Glucose 70 - 99 mg/dL 113(H) 97 114(H)  BUN 8 - 23 mg/dL 13 11 16   Creatinine 0.61 - 1.24 mg/dL 0.89 0.85 1.00  Sodium 135 - 145 mmol/L 138 142 137  Potassium 3.5 - 5.1 mmol/L 4.5 4.6 3.9  Chloride 98 - 111 mmol/L 101 104 101  CO2 22 - 32 mmol/L 28 27 26   Calcium 8.9 - 10.3 mg/dL 9.4 9.7 9.5  Total Protein 6.5 - 8.1 g/dL 6.4(L) 6.6 6.3(L)  Total Bilirubin 0.3 - 1.2 mg/dL 0.6 0.6 0.6  Alkaline Phos 38 - 126 U/L 45 41 45  AST 15 - 41 U/L 14(L) 17 18  ALT 0 - 44 U/L 8 9 9        DIAGNOSTIC IMAGING:  I have independently reviewed the scans and discussed with the patient.   I have reviewed Venita Lick LPN's note  and agree with the documentation.  I personally performed a face-to-face visit, made revisions and my assessment and plan is as follows.    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  -PSA has slowly but steadily increased since November 2019. - CT CAP on 10/14/2018 shows new small sclerotic lesions in T4 and T10.  But discs are questionable given no uptake on bone scan.  He also received Zometa which could alter the bones.  Bone scan on the same day was negative for any metastatic disease. -He was reportedly admitted to [facility] in [place] from 12/12/2018 through 12/22/2018 with Covid infection. -Abiraterone and prednisone from 07/13/2016 through 04/09/2019, discontinued secondary to PSA progression. -Enzalutamide 80 mg daily started on 04/12/2019. -Enzalutamide dose increased to 120 mg on 05/04/2019.  He has been tolerating it very well. -I have reviewed his labs today.  They are grossly within normal limits. -I have told him to increase it to 160 mg starting today. -He was supposed to get Lupron 45 mg in [place] on 05/09/2019.  However he was told to switch injections to our office. -We will obtain prior authorization and give his injection as soon as possible. -I will see him back in 4 weeks for follow-up.  I plan to repeat PSA at that time. -We will also consider doing foundation 1 CDX testing on the lymph node biopsy at some point.  2.  Androgen deprivation therapy induced bone loss: -He received Zometa every 3 months for a year completed on 10/29/2017. -We plan to repeat bone density in the near future.  He was told to continue calcium and vitamin D.  3.  Leg swelling: -He is continuing Bumex 1 mg daily.  He is also continuing potassium supplement.  4.  Genetic testing: -Germline mutation testing was negative for targetable mutations.  5.  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  6.  Hypertension: -He is on losartan 100 mg daily.  When the blood pressure systolic increases above 802, he is taking Norvasc 2.5 mg as needed. -Today systolic blood pressure is 147.    Orders placed this encounter:  Orders Placed This  Encounter  Procedures  . CBC with Differential/Platelet  . Comprehensive metabolic panel  . Magnesium  . PSA      Derek Jack, MD [place] [phone]

## 2019-05-31 NOTE — Assessment & Plan Note (Addendum)
1.  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 -PSA has slowly but steadily increased since November 2019. - CT CAP on 10/14/2018 shows new small sclerotic lesions in T4 and T10.  But discs are questionable given no uptake on bone scan.  He also received Zometa which could alter the bones.  Bone scan on the same day was negative for any metastatic disease. -He was reportedly admitted to [facility] in [place] from 12/12/2018 through 12/22/2018 with Covid infection. -Abiraterone and prednisone from 07/13/2016 through 04/09/2019, discontinued secondary to PSA progression. -Enzalutamide 80 mg daily started on 04/12/2019. -Enzalutamide dose increased to 120 mg on 05/04/2019.  He has been tolerating it very well. -I have reviewed his labs today.  They are grossly within normal limits. -I have told him to increase it to 160 mg starting today. -He was supposed to get Lupron 45 mg in [place] on 05/09/2019.  However he was told to switch injections to our office. -We will obtain prior authorization and give his injection as soon as possible. -I will see him back in 4 weeks for follow-up.  I plan to repeat PSA at that time. -We will also consider doing foundation 1 CDX testing on the lymph node biopsy at some point.  2.  Androgen deprivation therapy induced bone loss: -He received Zometa every 3 months for a year completed on 10/29/2017. -We plan to repeat bone density in the near future.  He was told to continue calcium and vitamin D.  3.  Leg swelling: -He is continuing Bumex 1 mg daily.  He is also continuing potassium supplement.  4.  Genetic testing: -Germline mutation testing was negative for targetable mutations.  5.  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  6.  Hypertension: -He is on  losartan 100 mg daily.  When the blood pressure systolic increases above [id number], he is taking Norvasc 2.5 mg as needed. -Today systolic blood pressure is 147.

## 2019-05-31 NOTE — Patient Instructions (Addendum)
[place] at [facility] Discharge Instructions  You were seen today by Dr. Delton Coombes. He went over your recent lab results. He will schedule you for further Lupron injections. Increase your cancer medication to 4 tablets daily. He will see you back in 4 weeks for labs and follow up.   Thank you for choosing [place] at [facility] to provide your oncology and hematology care.  To afford each patient quality time with our provider, please arrive at least 15 minutes before your scheduled appointment time.   If you have a lab appointment with the [place] please come in thru the  Main Entrance and check in at the main information desk  You need to re-schedule your appointment should you arrive 10 or more minutes late.  We strive to give you quality time with our providers, and arriving late affects you and other patients whose appointments are after yours.  Also, if you no show three or more times for appointments you may be dismissed from the clinic at the providers discretion.     Again, thank you for choosing [facility]  Our hope is that these requests will decrease the amount of time that you wait before being seen by our physicians.       _____________________________________________________________  Should you have questions after your visit to [facility], please contact our office at [phone] between the hours of 8:00 a.m. and 4:30 p.m.  Voicemails left after 4:00 p.m. will not be returned until the following business day.  For prescription refill requests, have your pharmacy contact our office and allow 72 hours.    Cancer Center Support Programs:   > Cancer Support Group  2nd Tuesday of the month 1pm-2pm, Journey Room

## 2019-06-06 ENCOUNTER — Other Ambulatory Visit (HOSPITAL_COMMUNITY): Payer: Self-pay | Admitting: Nurse Practitioner

## 2019-06-06 DIAGNOSIS — C61 Malignant neoplasm of prostate: Secondary | ICD-10-CM

## 2019-06-06 DIAGNOSIS — C771 Secondary and unspecified malignant neoplasm of intrathoracic lymph nodes: Secondary | ICD-10-CM

## 2019-06-09 ENCOUNTER — Other Ambulatory Visit: Payer: Self-pay

## 2019-06-09 ENCOUNTER — Encounter (HOSPITAL_COMMUNITY): Payer: Self-pay

## 2019-06-09 ENCOUNTER — Inpatient Hospital Stay (HOSPITAL_COMMUNITY): Payer: Medicare PPO

## 2019-06-09 VITALS — BP 171/81 | HR 67 | Temp 97.8°F | Resp 16

## 2019-06-09 DIAGNOSIS — C61 Malignant neoplasm of prostate: Secondary | ICD-10-CM

## 2019-06-09 MED ORDER — LEUPROLIDE ACETATE (6 MONTH) 45 MG ~~LOC~~ KIT
45.0000 mg | PACK | Freq: Once | SUBCUTANEOUS | Status: AC
  Administered 2019-06-09: 12:00:00 45 mg via SUBCUTANEOUS
  Filled 2019-06-09: qty 45

## 2019-06-09 NOTE — Patient Instructions (Signed)
[facility] at [facility] Discharge Instructions  Received Eligard injection today. Follow-up as scheduled. Call clinic for any questions or concerns   Thank you for choosing [facility] at [facility] to provide your oncology and hematology care.  To afford each patient quality time with our provider, please arrive at least 15 minutes before your scheduled appointment time.   If you have a lab appointment with the Cancer Center please come in thru the Main Entrance and check in at the main information desk.  You need to re-schedule your appointment should you arrive 10 or more minutes late.  We strive to give you quality time with our providers, and arriving late affects you and other patients whose appointments are after yours.  Also, if you no show three or more times for appointments you may be dismissed from the clinic at the providers discretion.     Again, thank you for choosing [facility].  Our hope is that these requests will decrease the amount of time that you wait before being seen by our physicians.       _____________________________________________________________  Should you have questions after your visit to [facility], please contact our office at [phone] between the hours of 8:00 a.m. and 4:30 p.m.  Voicemails left after 4:00 p.m. will not be returned until the following business day.  For prescription refill requests, have your pharmacy contact our office and allow 72 hours.    Due to Covid, you will need to wear a mask upon entering the hospital. If you do not have a mask, a mask will be given to you at the Main Entrance upon arrival. For doctor visits, patients may have 1 support person with them. For treatment visits, patients can not have anyone with them due to social distancing guidelines and our immunocompromised population.     

## 2019-06-09 NOTE — Progress Notes (Signed)
Andre [name] tolerated Eligard injection well without complaints or incident. VSS Pt discharged self ambulatory using his cane in satisfactory condition accompanied by his daughter

## 2019-06-16 MED FILL — XTANDI 40 MG CAPSULE: 40 | 30 days supply | Qty: 120 | Fill #0

## 2019-07-04 ENCOUNTER — Other Ambulatory Visit (HOSPITAL_COMMUNITY): Payer: Medicare PPO

## 2019-07-04 ENCOUNTER — Ambulatory Visit (HOSPITAL_COMMUNITY): Payer: Medicare PPO | Admitting: Hematology

## 2019-07-06 ENCOUNTER — Inpatient Hospital Stay (HOSPITAL_COMMUNITY): Payer: Medicare PPO | Attending: Hematology

## 2019-07-06 ENCOUNTER — Other Ambulatory Visit: Payer: Self-pay

## 2019-07-06 ENCOUNTER — Encounter (HOSPITAL_COMMUNITY): Payer: Self-pay | Admitting: Hematology

## 2019-07-06 ENCOUNTER — Inpatient Hospital Stay (HOSPITAL_COMMUNITY): Payer: Medicare PPO | Admitting: Hematology

## 2019-07-06 VITALS — BP 143/70 | HR 65 | Temp 97.1°F | Resp 20 | Wt 230.9 lb

## 2019-07-06 DIAGNOSIS — Z8041 Family history of malignant neoplasm of ovary: Secondary | ICD-10-CM | POA: Diagnosis not present

## 2019-07-06 DIAGNOSIS — Z7984 Long term (current) use of oral hypoglycemic drugs: Secondary | ICD-10-CM | POA: Insufficient documentation

## 2019-07-06 DIAGNOSIS — C61 Malignant neoplasm of prostate: Secondary | ICD-10-CM

## 2019-07-06 DIAGNOSIS — M7989 Other specified soft tissue disorders: Secondary | ICD-10-CM | POA: Insufficient documentation

## 2019-07-06 DIAGNOSIS — E119 Type 2 diabetes mellitus without complications: Secondary | ICD-10-CM | POA: Insufficient documentation

## 2019-07-06 DIAGNOSIS — R197 Diarrhea, unspecified: Secondary | ICD-10-CM | POA: Diagnosis not present

## 2019-07-06 DIAGNOSIS — Z79899 Other long term (current) drug therapy: Secondary | ICD-10-CM | POA: Insufficient documentation

## 2019-07-06 DIAGNOSIS — Z8249 Family history of ischemic heart disease and other diseases of the circulatory system: Secondary | ICD-10-CM | POA: Diagnosis not present

## 2019-07-06 DIAGNOSIS — D649 Anemia, unspecified: Secondary | ICD-10-CM | POA: Diagnosis not present

## 2019-07-06 DIAGNOSIS — C771 Secondary and unspecified malignant neoplasm of intrathoracic lymph nodes: Secondary | ICD-10-CM | POA: Insufficient documentation

## 2019-07-06 DIAGNOSIS — Z8616 Personal history of COVID-19: Secondary | ICD-10-CM | POA: Diagnosis not present

## 2019-07-06 DIAGNOSIS — J449 Chronic obstructive pulmonary disease, unspecified: Secondary | ICD-10-CM | POA: Insufficient documentation

## 2019-07-06 DIAGNOSIS — Z9221 Personal history of antineoplastic chemotherapy: Secondary | ICD-10-CM | POA: Insufficient documentation

## 2019-07-06 DIAGNOSIS — G629 Polyneuropathy, unspecified: Secondary | ICD-10-CM | POA: Insufficient documentation

## 2019-07-06 DIAGNOSIS — Z87891 Personal history of nicotine dependence: Secondary | ICD-10-CM | POA: Diagnosis not present

## 2019-07-06 DIAGNOSIS — I10 Essential (primary) hypertension: Secondary | ICD-10-CM | POA: Insufficient documentation

## 2019-07-06 DIAGNOSIS — M8580 Other specified disorders of bone density and structure, unspecified site: Secondary | ICD-10-CM | POA: Diagnosis not present

## 2019-07-06 DIAGNOSIS — Z8042 Family history of malignant neoplasm of prostate: Secondary | ICD-10-CM | POA: Insufficient documentation

## 2019-07-06 DIAGNOSIS — Z7982 Long term (current) use of aspirin: Secondary | ICD-10-CM | POA: Insufficient documentation

## 2019-07-06 DIAGNOSIS — Z791 Long term (current) use of non-steroidal anti-inflammatories (NSAID): Secondary | ICD-10-CM | POA: Diagnosis not present

## 2019-07-06 LAB — COMPREHENSIVE METABOLIC PANEL
ALT: 8 U/L (ref 0–44)
AST: 17 U/L (ref 15–41)
Albumin: 3.7 g/dL (ref 3.5–5.0)
Alkaline Phosphatase: 44 U/L (ref 38–126)
Anion gap: 11 (ref 5–15)
BUN: 13 mg/dL (ref 8–23)
CO2: 28 mmol/L (ref 22–32)
Calcium: 9.6 mg/dL (ref 8.9–10.3)
Chloride: 100 mmol/L (ref 98–111)
Creatinine, Ser: 0.92 mg/dL (ref 0.61–1.24)
GFR calc Af Amer: >60 mL/min (ref >60)
GFR calc non Af Amer: >60 mL/min (ref >60)
Glucose, Bld: 92 mg/dL (ref 70–99)
Potassium: 4.1 mmol/L (ref 3.5–5.1)
Sodium: 139 mmol/L (ref 135–145)
Total Bilirubin: 0.8 mg/dL (ref 0.3–1.2)
Total Protein: 6.7 g/dL (ref 6.5–8.1)

## 2019-07-06 LAB — PSA: Prostatic Specific Antigen: 1.38 ng/mL (ref 0.00–4.00)

## 2019-07-06 LAB — CBC WITH DIFFERENTIAL/PLATELET
Abs Immature Granulocytes: 0.04 10*3/uL (ref 0.00–0.07)
Basophils Absolute: 0.1 10*3/uL (ref 0.0–0.1)
Basophils Relative: 1 %
Eosinophils Absolute: 0.4 10*3/uL (ref 0.0–0.5)
Eosinophils Relative: 3 %
HCT: 37.4 % — ABNORMAL LOW (ref 39.0–52.0)
Hemoglobin: 11.8 g/dL — ABNORMAL LOW (ref 13.0–17.0)
Immature Granulocytes: 0 %
Lymphocytes Relative: 11 %
Lymphs Abs: 1.2 10*3/uL (ref 0.7–4.0)
MCH: 27.2 pg (ref 26.0–34.0)
MCHC: 31.6 g/dL (ref 30.0–36.0)
MCV: 86.2 fL (ref 80.0–100.0)
Monocytes Absolute: 1.1 10*3/uL — ABNORMAL HIGH (ref 0.1–1.0)
Monocytes Relative: 10 %
Neutro Abs: 7.7 10*3/uL (ref 1.7–7.7)
Neutrophils Relative %: 75 %
Platelets: 373 10*3/uL (ref 150–400)
RBC: 4.34 MIL/uL (ref 4.22–5.81)
RDW: 14.6 % (ref 11.5–15.5)
WBC: 10.4 10*3/uL (ref 4.0–10.5)
nRBC: 0 % (ref 0.0–0.2)

## 2019-07-06 LAB — MAGNESIUM: Magnesium: 1.8 mg/dL (ref 1.7–2.4)

## 2019-07-06 MED ORDER — SODIUM CHLORIDE 0.9% FLUSH
10.0000 mL | Freq: Once | INTRAVENOUS | Status: AC
  Administered 2019-07-06: 10 mL via INTRAVENOUS

## 2019-07-06 MED ORDER — HEPARIN SOD (PORK) LOCK FLUSH 100 UNIT/ML IV SOLN
500.0000 [IU] | Freq: Once | INTRAVENOUS | Status: AC
  Administered 2019-07-06: 500 [IU] via INTRAVENOUS

## 2019-07-06 NOTE — Patient Instructions (Addendum)
[place] at [facility] Discharge Instructions  You were seen today by Dr. Delton Coombes. He went over your recent lab results. Continue taking medication as directed. Try taking at bedtime to help with tiredness. He will see you back in 2 months for labs and follow up.   Thank you for choosing [place] at [facility] to provide your oncology and hematology care.  To afford each patient quality time with our provider, please arrive at least 15 minutes before your scheduled appointment time.   If you have a lab appointment with the [place] please come in thru the  Main Entrance and check in at the main information desk  You need to re-schedule your appointment should you arrive 10 or more minutes late.  We strive to give you quality time with our providers, and arriving late affects you and other patients whose appointments are after yours.  Also, if you no show three or more times for appointments you may be dismissed from the clinic at the providers discretion.     Again, thank you for choosing [facility].  Our hope is that these requests will decrease the amount of time that you wait before being seen by our physicians.       _____________________________________________________________  Should you have questions after your visit to [facility], please contact our office at [phone] between the hours of 8:00 a.m. and 4:30 p.m.  Voicemails left after 4:00 p.m. will not be returned until the following business day.  For prescription refill requests, have your pharmacy contact our office and allow 72 hours.    Cancer Center Support Programs:   > Cancer Support Group  2nd Tuesday of the month 1pm-2pm, Journey Room

## 2019-07-06 NOTE — Progress Notes (Signed)
Patients port flushed without difficulty.  Good blood return noted with no bruising or swelling noted at site.  Band aid applied.  VSS with discharge and left ambulatory with no s/s of distress noted.  

## 2019-07-06 NOTE — Progress Notes (Signed)
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  The Common Hereditary Gene Panel offered by [facility] includes sequencing and/or deletion duplication testing of the following 48 genes: APC, ATM, AXIN2, BARD1, BMPR1A, BRCA1, BRCA2, BRIP1, CDH1, CDK4, CDKN2A (p14ARF), CDKN2A (p16INK4a), CHEK2, CTNNA1, DICER1, EPCAM (Deletion/duplication testing only), GREM1 (promoter region deletion/duplication testing only), KIT, MEN1, MLH1, MSH2, MSH3, MSH6, MUTYH, NBN, NF1, NHTL1, PALB2, PDGFRA, PMS2, POLD1, POLE, PTEN, RAD50, RAD51C, RAD51D, RNF43, SDHB, SDHC, SDHD, SMAD4, SMARCA4. STK11, TP53, TSC1, TSC2, and VHL.  The following genes were evaluated for sequence changes only: SDHA and HOXB13 c.251G>A variant only. The report date is Oct 14, 2018.      CANCER STAGING: Cancer Staging No matching staging information was found for the patient.   INTERVAL HISTORY:  Andre [name] 79 y.o. male seen for follow-up of metastatic prostate cancer to lymph nodes.  He is taking enzalutamide 4 tablets in the morning.  He complains of sleepiness  during daytime.  He received Lupron injection on 06/09/2019.  Appetite is 100%.  Energy levels are 75%.  Dry cough is present.  Swelling in the feet is slightly worse.  Shortness of breath with activity is stable.  Feet feel cold all the time.  Occasional diarrhea present.    REVIEW OF SYSTEMS:  Review of Systems  Cardiovascular: Positive for leg swelling.  Gastrointestinal: Positive for diarrhea.  All other systems reviewed and are negative.    PAST MEDICAL/SURGICAL HISTORY:  Past Medical History:  Diagnosis Date  . Cancer [facility])    prostate  . COPD (chronic obstructive pulmonary disease) ([place])   . Diabetes mellitus without complication ([place])   . Family history of ovarian cancer   . Family history of prostate cancer   . Hypertension    Past Surgical History:  Procedure Laterality Date  . GOLD SEED IMPLANT       SOCIAL HISTORY:  Social History   Socioeconomic History  . Marital status: Widowed    Spouse name: Not on file  . Number of children: Not on file  . Years of education: Not on file  . Highest education level: Not on file  Occupational History  . Not on file  Tobacco Use  . Smoking status: Former Smoker    Types: Cigarettes    Quit date: 06/25/2014    Years since quitting: 5.0  . Smokeless tobacco: Never Used  . Tobacco comment: smoked since young age, quit in 2016  Substance and Sexual Activity  . Alcohol use: Not Currently  . Drug use: No  . Sexual activity: Not on file  Other Topics Concern  . Not on file  Social History Narrative  .  Not on file   Social Determinants of Health   Financial Resource Strain:   . Difficulty of Paying Living Expenses: Not on file  Food Insecurity:   . Worried About Charity fundraiser in the Last Year: Not on file  . Ran Out of Food in the Last Year: Not on file  Transportation Needs:   . Lack of Transportation (Medical): Not on file  . Lack of Transportation (Non-Medical): Not on file  Physical Activity:   . Days  of Exercise per Week: Not on file  . Minutes of Exercise per Session: Not on file  Stress:   . Feeling of Stress : Not on file  Social Connections:   . Frequency of Communication with Friends and Family: Not on file  . Frequency of Social Gatherings with Friends and Family: Not on file  . Attends Religious Services: Not on file  . Active Member of Clubs or Organizations: Not on file  . Attends Archivist Meetings: Not on file  . Marital Status: Not on file  Intimate Partner Violence:   . Fear of Current or Ex-Partner: Not on file  . Emotionally Abused: Not on file  . Physically Abused: Not on file  . Sexually Abused: Not on file    FAMILY HISTORY:  Family History  Problem Relation Age of Onset  . Ovarian cancer Mother 68  . Heart attack Father   . Prostate cancer Maternal Uncle 66  . Cancer Cousin        pat cousin with unknown cancer    CURRENT MEDICATIONS:  Outpatient Encounter Medications as of 07/06/2019  Medication Sig  . ACCU-CHEK GUIDE test strip   . Accu-Chek Softclix Lancets lancets   . acetaminophen (TYLENOL) 500 MG tablet Take 500 mg by mouth every 6 (six) hours as needed for moderate pain.  Marland Kitchen amLODipine (NORVASC) 2.5 MG tablet Take 1 tablet (2.5 mg total) by mouth daily as needed.  Marland Kitchen aspirin EC 81 MG tablet Take 81 mg by mouth daily.  Marland Kitchen atorvastatin (LIPITOR) 10 MG tablet Take 10 mg by mouth at bedtime.  . Blood Glucose Monitoring Suppl (ACCU-CHEK GUIDE) w/Device KIT   . brimonidine (ALPHAGAN) 0.2 % ophthalmic solution Place 1 drop into both eyes 3 (three) times daily.  . bumetanide (BUMEX) 1 MG tablet Take 1 tablet (1 mg total) by mouth daily.  Marland Kitchen CALCIUM PO Take 600 mg by mouth daily.   . cetirizine (ZYRTEC) 10 MG tablet Take 10 mg by mouth daily.  Marland Kitchen CHERATUSSIN AC 100-10 MG/5ML syrup Take 5 mLs by mouth daily as needed.   . Cholecalciferol (VITAMIN D-3 PO) Take 1 tablet by mouth daily.  . cyclobenzaprine (FLEXERIL) 10 MG tablet Take 10 mg by mouth as  needed.   . dicyclomine (BENTYL) 10 MG capsule Take 10 mg by mouth daily.  . dorzolamide (TRUSOPT) 2 % ophthalmic solution Place 1 drop into both eyes 3 (three) times daily.  . ferrous sulfate 325 (65 FE) MG tablet Take 325 mg by mouth daily with breakfast.  . fluticasone (FLONASE) 50 MCG/ACT nasal spray USE 2 SPRAY(S) IN EACH NOSTRIL ONCE DAILY FOR 30 DAYS  . ibuprofen (ADVIL,MOTRIN) 600 MG tablet Take 600 mg by mouth every 6 (six) hours as needed.  . Latanoprostene Bunod (VYZULTA) 0.024 % SOLN Apply 1 drop to eye 1 day or 1 dose.  . losartan (COZAAR) 100 MG tablet Take 100 mg by mouth daily.  . magnesium oxide (MAG-OX) 400 MG tablet Take 1  tablet (400 mg total) by mouth every other day.  . metFORMIN (GLUCOPHAGE) 500 MG tablet Take 1,000 mg by mouth 2 (two) times daily with a meal.   . potassium chloride (MICRO-K) 10 MEQ CR capsule Take 40 mEq by mouth daily.   . prochlorperazine (COMPAZINE) 10 MG tablet Take 10 mg by mouth every 6 (six) hours as needed for nausea or vomiting.  . Tiotropium Bromide Monohydrate (SPIRIVA RESPIMAT) 1.25 MCG/ACT AERS   . TRELEGY ELLIPTA 100-62.5-25 MCG/INH AEPB INHALE 1 PUFF ONCE DAILY  . vitamin C (ASCORBIC ACID) 500 MG tablet Take 500 mg by mouth daily.  Gillermina Phy 40 MG capsule TAKE 4 CAPSULES (160 MG TOTAL) BY MOUTH DAILY.  . [DISCONTINUED] potassium chloride SA (KLOR-CON) 20 MEQ tablet   . [EXPIRED] heparin lock flush 100 unit/mL   . [EXPIRED] sodium chloride flush (NS) 0.9 % injection 10 mL    No facility-administered encounter medications on file as of 07/06/2019.    ALLERGIES:  Allergies  Allergen Reactions  . Metoprolol      PHYSICAL EXAM:  ECOG Performance status: 1  Vitals:   07/06/19 1512  BP: (!) 143/70  Pulse: 65  Resp: 20  Temp: (!) 97.1 F (36.2 C)  SpO2: 96%   Filed Weights   07/06/19 1512  Weight: 230 lb 14.4 oz (104.7 kg)    Physical Exam Vitals reviewed.  Constitutional:      Appearance: Normal appearance.    Cardiovascular:     Rate and Rhythm: Normal rate and regular rhythm.     Heart sounds: Normal heart sounds.  Pulmonary:     Effort: Pulmonary effort is normal.     Breath sounds: Normal breath sounds.  Abdominal:     General: There is no distension.     Palpations: Abdomen is soft. There is no mass.  Musculoskeletal:     Right lower leg: Edema present.     Left lower leg: Edema present.  Skin:    General: Skin is warm.  Neurological:     General: No focal deficit present.     Mental Status: He is alert and oriented to person, place, and time.  Psychiatric:        Mood and Affect: Mood normal.        Behavior: Behavior normal.      LABORATORY DATA:  I have reviewed the labs as listed.  CBC    Component Value Date/Time   WBC 10.4 07/06/2019 1417   RBC 4.34 07/06/2019 1417   HGB 11.8 (L) 07/06/2019 1417   HCT 37.4 (L) 07/06/2019 1417   PLT 373 07/06/2019 1417   MCV 86.2 07/06/2019 1417   MCH 27.2 07/06/2019 1417   MCHC 31.6 07/06/2019 1417   RDW 14.6 07/06/2019 1417   LYMPHSABS 1.2 07/06/2019 1417   MONOABS 1.1 (H) 07/06/2019 1417   EOSABS 0.4 07/06/2019 1417   BASOSABS 0.1 07/06/2019 1417   CMP Latest Ref Rng & Units 07/06/2019 05/31/2019 05/04/2019  Glucose 70 - 99 mg/dL 92 113(H) 97  BUN 8 - 23 mg/dL 13 13 11   Creatinine 0.61 - 1.24 mg/dL 0.92 0.89 0.85  Sodium 135 - 145 mmol/L 139 138 142  Potassium 3.5 - 5.1 mmol/L 4.1 4.5 4.6  Chloride 98 - 111 mmol/L 100 101 104  CO2 22 - 32 mmol/L 28 28 27   Calcium 8.9 - 10.3 mg/dL 9.6 9.4 9.7  Total Protein 6.5 - 8.1 g/dL 6.7 6.4(L) 6.6  Total Bilirubin 0.3 - 1.2 mg/dL 0.8  0.6 0.6  Alkaline Phos 38 - 126 U/L 44 45 41  AST 15 - 41 U/L 17 14(L) 17  ALT 0 - 44 U/L 8 8 9        DIAGNOSTIC IMAGING:  I have independently reviewed the scans and discussed with the patient.   I have reviewed Venita Lick LPN's note and agree with the documentation.  I personally performed a face-to-face visit, made revisions and my  assessment and plan is as follows.    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 -PSA has slowly but steadily increased since November 2019. - CT CAP on 10/14/2018 shows new small sclerotic lesions in T4 and T10.  But discs are questionable given no uptake on bone scan.  He also received Zometa which could alter the bones.  Bone scan on the same day was negative for any metastatic disease. -He was reportedly admitted to [facility] in [place] from 12/12/2018 through 12/22/2018 with Covid infection. -Abiraterone and prednisone from 07/13/2016 through 04/09/2019, discontinued secondary to PSA progression. -Enzalutamide 80 mg daily started on 04/12/2019. -Enzalutamide 120 mg on 05/04/2019. -Dose was increased to 60 mg daily on 05/31/2019. -He was supposed to get Lupron 45 mg in [place] on 05/09/2019.  However he got injection at our clinic on 06/09/2019. -He is tolerating enzalutamide reasonably well.  He is complaining of sleepiness and takes the pill at 8 AM. -I have suggested him to take enzalutamide at bedtime. -We reviewed his labs today.  CBC and LFTs are normal.  He has mild anemia between 11 and 12 from androgen deprivation therapy. -PSA level from today is pending.  We will continue enzalutamide even if the PSA goes up slightly as he had received Lupron late.  I have suggested him to take Imodium if needed for loose stools which started after enzalutamide. -I plan to reevaluate him in 2 months with repeat PSA.  2.  Androgen deprivation therapy induced bone loss: -He received Zometa every 3 months for a year completed on 10/29/2017. -He will continue calcium and vitamin D.  We will plan to repeat bone density in the near future.  3.  Leg swelling: -He  will continue Bumex 1 mg daily.  If they are worse, he will take 1 and half tablet daily.  4.  Genetic testing: -Germline mutation testing was negative.  5.  Peripheral neuropathy: -His feet feel cold all the time.  6.  Hypertension: -He will continue losartan 100 mg daily.  If systolic is above 144, he will take Norvasc 2.5 mg as needed.  Today systolic blood pressure is 143/70.    Orders placed this encounter:  Orders Placed This Encounter  Procedures  . CBC with Differential/Platelet  . Comprehensive metabolic panel  . Magnesium  . PSA      Derek Jack, MD [place] [phone]

## 2019-07-07 ENCOUNTER — Encounter (HOSPITAL_COMMUNITY): Payer: Self-pay | Admitting: Hematology

## 2019-07-07 ENCOUNTER — Other Ambulatory Visit (HOSPITAL_COMMUNITY): Payer: Self-pay | Admitting: Nurse Practitioner

## 2019-07-07 DIAGNOSIS — C61 Malignant neoplasm of prostate: Secondary | ICD-10-CM

## 2019-07-07 NOTE — Assessment & Plan Note (Addendum)
1.  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 -PSA has slowly but steadily increased since November 2019. - CT CAP on 10/14/2018 shows new small sclerotic lesions in T4 and T10.  But discs are questionable given no uptake on bone scan.  He also received Zometa which could alter the bones.  Bone scan on the same day was negative for any metastatic disease. -He was reportedly admitted to [facility] in [place] from 12/12/2018 through 12/22/2018 with Covid infection. -Abiraterone and prednisone from 07/13/2016 through 04/09/2019, discontinued secondary to PSA progression. -Enzalutamide 80 mg daily started on 04/12/2019. -Enzalutamide 120 mg on 05/04/2019. -Dose was increased to 60 mg daily on 05/31/2019. -He was supposed to get Lupron 45 mg in [place] on 05/09/2019.  However he got injection at our clinic on 06/09/2019. -He is tolerating enzalutamide reasonably well.  He is complaining of sleepiness and takes the pill at 8 AM. -I have suggested him to take enzalutamide at bedtime. -We reviewed his labs today.  CBC and LFTs are normal.  He has mild anemia between 11 and 12 from androgen deprivation therapy. -PSA level from today is pending.  We will continue enzalutamide even if the PSA goes up slightly as he had received Lupron late.  I have suggested him to take Imodium if needed for loose stools which started after enzalutamide. -I plan to reevaluate him in 2 months with repeat PSA.  2.  Androgen deprivation therapy induced bone loss: -He received Zometa every 3 months for a year completed on 10/29/2017. -He will continue calcium and vitamin D.  We will plan to repeat bone density in the near future.  3.  Leg swelling: -He will continue Bumex 1 mg daily.  If they are worse, he will take 1 and half tablet daily.  4.  Genetic testing: -Germline  mutation testing was negative.  5.  Peripheral neuropathy: -His feet feel cold all the time.  6.  Hypertension: -He will continue losartan 100 mg daily.  If systolic is above [id number], he will take Norvasc 2.5 mg as needed.  Today systolic blood pressure is 143/70.

## 2019-07-17 MED FILL — XTANDI 40 MG CAPSULE: 40 | 30 days supply | Qty: 120 | Fill #0

## 2019-08-22 ENCOUNTER — Other Ambulatory Visit (HOSPITAL_COMMUNITY): Payer: Self-pay | Admitting: Nurse Practitioner

## 2019-08-22 DIAGNOSIS — C771 Secondary and unspecified malignant neoplasm of intrathoracic lymph nodes: Secondary | ICD-10-CM

## 2019-08-22 DIAGNOSIS — C61 Malignant neoplasm of prostate: Secondary | ICD-10-CM

## 2019-08-22 MED FILL — XTANDI 40 MG CAPSULE: 40 | 30 days supply | Qty: 120 | Fill #0

## 2019-08-30 ENCOUNTER — Other Ambulatory Visit: Payer: Self-pay

## 2019-08-30 ENCOUNTER — Other Ambulatory Visit (HOSPITAL_COMMUNITY): Payer: Medicare PPO

## 2019-08-30 ENCOUNTER — Inpatient Hospital Stay (HOSPITAL_COMMUNITY): Payer: Medicare PPO | Attending: Hematology

## 2019-08-30 DIAGNOSIS — Z8249 Family history of ischemic heart disease and other diseases of the circulatory system: Secondary | ICD-10-CM | POA: Diagnosis not present

## 2019-08-30 DIAGNOSIS — Z87891 Personal history of nicotine dependence: Secondary | ICD-10-CM | POA: Insufficient documentation

## 2019-08-30 DIAGNOSIS — Z791 Long term (current) use of non-steroidal anti-inflammatories (NSAID): Secondary | ICD-10-CM | POA: Diagnosis not present

## 2019-08-30 DIAGNOSIS — M8580 Other specified disorders of bone density and structure, unspecified site: Secondary | ICD-10-CM | POA: Insufficient documentation

## 2019-08-30 DIAGNOSIS — G629 Polyneuropathy, unspecified: Secondary | ICD-10-CM | POA: Diagnosis not present

## 2019-08-30 DIAGNOSIS — Z8041 Family history of malignant neoplasm of ovary: Secondary | ICD-10-CM | POA: Insufficient documentation

## 2019-08-30 DIAGNOSIS — C771 Secondary and unspecified malignant neoplasm of intrathoracic lymph nodes: Secondary | ICD-10-CM

## 2019-08-30 DIAGNOSIS — Z7982 Long term (current) use of aspirin: Secondary | ICD-10-CM | POA: Diagnosis not present

## 2019-08-30 DIAGNOSIS — Z7984 Long term (current) use of oral hypoglycemic drugs: Secondary | ICD-10-CM | POA: Insufficient documentation

## 2019-08-30 DIAGNOSIS — E119 Type 2 diabetes mellitus without complications: Secondary | ICD-10-CM | POA: Insufficient documentation

## 2019-08-30 DIAGNOSIS — C61 Malignant neoplasm of prostate: Secondary | ICD-10-CM | POA: Diagnosis present

## 2019-08-30 DIAGNOSIS — M7989 Other specified soft tissue disorders: Secondary | ICD-10-CM | POA: Insufficient documentation

## 2019-08-30 DIAGNOSIS — J449 Chronic obstructive pulmonary disease, unspecified: Secondary | ICD-10-CM | POA: Insufficient documentation

## 2019-08-30 DIAGNOSIS — Z8042 Family history of malignant neoplasm of prostate: Secondary | ICD-10-CM | POA: Diagnosis not present

## 2019-08-30 DIAGNOSIS — I10 Essential (primary) hypertension: Secondary | ICD-10-CM | POA: Diagnosis not present

## 2019-08-30 DIAGNOSIS — Z79899 Other long term (current) drug therapy: Secondary | ICD-10-CM | POA: Insufficient documentation

## 2019-08-30 LAB — CBC WITH DIFFERENTIAL/PLATELET
Abs Immature Granulocytes: 0.03 10*3/uL (ref 0.00–0.07)
Basophils Absolute: 0.1 10*3/uL (ref 0.0–0.1)
Basophils Relative: 1 %
Eosinophils Absolute: 0.3 10*3/uL (ref 0.0–0.5)
Eosinophils Relative: 3 %
HCT: 38.2 % — ABNORMAL LOW (ref 39.0–52.0)
Hemoglobin: 12.1 g/dL — ABNORMAL LOW (ref 13.0–17.0)
Immature Granulocytes: 0 %
Lymphocytes Relative: 12 %
Lymphs Abs: 1.4 10*3/uL (ref 0.7–4.0)
MCH: 26.9 pg (ref 26.0–34.0)
MCHC: 31.7 g/dL (ref 30.0–36.0)
MCV: 85.1 fL (ref 80.0–100.0)
Monocytes Absolute: 1 10*3/uL (ref 0.1–1.0)
Monocytes Relative: 9 %
Neutro Abs: 8.3 10*3/uL — ABNORMAL HIGH (ref 1.7–7.7)
Neutrophils Relative %: 75 %
Platelets: 373 10*3/uL (ref 150–400)
RBC: 4.49 MIL/uL (ref 4.22–5.81)
RDW: 14.6 % (ref 11.5–15.5)
WBC: 11.1 10*3/uL — ABNORMAL HIGH (ref 4.0–10.5)
nRBC: 0 % (ref 0.0–0.2)

## 2019-08-30 LAB — COMPREHENSIVE METABOLIC PANEL
ALT: 9 U/L (ref 0–44)
AST: 16 U/L (ref 15–41)
Albumin: 3.8 g/dL (ref 3.5–5.0)
Alkaline Phosphatase: 45 U/L (ref 38–126)
Anion gap: 12 (ref 5–15)
BUN: 14 mg/dL (ref 8–23)
CO2: 26 mmol/L (ref 22–32)
Calcium: 9.8 mg/dL (ref 8.9–10.3)
Chloride: 102 mmol/L (ref 98–111)
Creatinine, Ser: 0.93 mg/dL (ref 0.61–1.24)
GFR calc Af Amer: >60 mL/min (ref >60)
GFR calc non Af Amer: >60 mL/min (ref >60)
Glucose, Bld: 90 mg/dL (ref 70–99)
Potassium: 4.1 mmol/L (ref 3.5–5.1)
Sodium: 140 mmol/L (ref 135–145)
Total Bilirubin: 0.7 mg/dL (ref 0.3–1.2)
Total Protein: 6.9 g/dL (ref 6.5–8.1)

## 2019-08-30 LAB — MAGNESIUM: Magnesium: 1.8 mg/dL (ref 1.7–2.4)

## 2019-08-30 LAB — PSA: Prostatic Specific Antigen: 1.94 ng/mL (ref 0.00–4.00)

## 2019-09-06 ENCOUNTER — Inpatient Hospital Stay (HOSPITAL_COMMUNITY): Payer: Medicare PPO | Admitting: Hematology

## 2019-09-06 ENCOUNTER — Encounter (HOSPITAL_COMMUNITY): Payer: Self-pay | Admitting: Hematology

## 2019-09-06 ENCOUNTER — Other Ambulatory Visit: Payer: Self-pay

## 2019-09-06 VITALS — BP 148/86 | HR 75 | Temp 96.4°F | Resp 20 | Wt 227.0 lb

## 2019-09-06 DIAGNOSIS — C771 Secondary and unspecified malignant neoplasm of intrathoracic lymph nodes: Secondary | ICD-10-CM | POA: Diagnosis not present

## 2019-09-06 DIAGNOSIS — C61 Malignant neoplasm of prostate: Secondary | ICD-10-CM

## 2019-09-06 MED ORDER — SPIRIVA RESPIMAT 1.25 MCG/ACT IN AERS: 2.5000 ug | INHALATION_SPRAY | RESPIRATORY_TRACT | 4 refills | Status: DC | PRN

## 2019-09-06 NOTE — Progress Notes (Signed)
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  The Common Hereditary Gene Panel offered by [facility] includes sequencing and/or deletion duplication testing of the following 48 genes: APC, ATM, AXIN2, BARD1, BMPR1A, BRCA1, BRCA2, BRIP1, CDH1, CDK4, CDKN2A (p14ARF), CDKN2A (p16INK4a), CHEK2, CTNNA1, DICER1, EPCAM (Deletion/duplication testing only), GREM1 (promoter region deletion/duplication testing only), KIT, MEN1, MLH1, MSH2, MSH3, MSH6, MUTYH, NBN, NF1, NHTL1, PALB2, PDGFRA, PMS2, POLD1, POLE, PTEN, RAD50, RAD51C, RAD51D, RNF43, SDHB, SDHC, SDHD, SMAD4, SMARCA4. STK11, TP53, TSC1, TSC2, and VHL.  The following genes were evaluated for sequence changes only: SDHA and HOXB13 c.251G>A variant only. The report date is Oct 14, 2018.      CANCER STAGING: Cancer Staging No matching staging information was found for the patient.   INTERVAL HISTORY:  Andre [name] 79 y.o. male seen for follow-up of metastatic prostate cancer to the lymph nodes.  He is taking enzalutamide 4 tablets daily in the mornings.  He is  accompanied by his daughter today.  Appetite is 100%.  Energy levels are 75%.  Reports some shortness of breath with activity.  Is also variable.  Numbness in the fingertips is stable.  He has intermittent diarrhea which happens mostly when he corrects the constipation.    REVIEW OF SYSTEMS:  Review of Systems  Respiratory: Positive for shortness of breath.   Cardiovascular: Positive for leg swelling.  Gastrointestinal: Positive for diarrhea.  Neurological: Positive for numbness.  All other systems reviewed and are negative.    PAST MEDICAL/SURGICAL HISTORY:  Past Medical History:  Diagnosis Date  . Cancer [facility])    prostate  . COPD (chronic obstructive pulmonary disease) ([place])   . Diabetes mellitus without complication ([place])   . Family history of ovarian cancer   . Family history of prostate cancer   . Hypertension    Past Surgical History:  Procedure Laterality Date  . GOLD SEED IMPLANT       SOCIAL HISTORY:  Social History   Socioeconomic History  . Marital status: Widowed    Spouse name: Not on file  . Number of children: Not on file  . Years of education: Not on file  . Highest education level: Not on file  Occupational History  . Not on file  Tobacco Use  . Smoking status: Former Smoker    Types: Cigarettes    Quit date: 06/25/2014    Years since quitting: 5.2  . Smokeless tobacco: Never Used  . Tobacco comment: smoked since young age, quit in 2016  Substance and Sexual Activity  . Alcohol use: Not Currently  . Drug use: No  . Sexual activity: Not on file  Other Topics Concern  .  Not on file  Social History Narrative  . Not on file   Social Determinants of Health   Financial Resource Strain:   . Difficulty of Paying Living Expenses:   Food Insecurity:   . Worried About Charity fundraiser in the Last Year:   . Arboriculturist in the Last Year:   Transportation Needs:   . Film/video editor (Medical):   Marland Kitchen Lack of Transportation (Non-Medical):     Physical Activity:   . Days of Exercise per Week:   . Minutes of Exercise per Session:   Stress:   . Feeling of Stress :   Social Connections:   . Frequency of Communication with Friends and Family:   . Frequency of Social Gatherings with Friends and Family:   . Attends Religious Services:   . Active Member of Clubs or Organizations:   . Attends Archivist Meetings:   Marland Kitchen Marital Status:   Intimate Partner Violence:   . Fear of Current or Ex-Partner:   . Emotionally Abused:   Marland Kitchen Physically Abused:   . Sexually Abused:     FAMILY HISTORY:  Family History  Problem Relation Age of Onset  . Ovarian cancer Mother 35  . Heart attack Father   . Prostate cancer Maternal Uncle 5  . Cancer Cousin        pat cousin with unknown cancer    CURRENT MEDICATIONS:  Outpatient Encounter Medications as of 09/06/2019  Medication Sig  . ACCU-CHEK GUIDE test strip   . Accu-Chek Softclix Lancets lancets   . acetaminophen (TYLENOL) 500 MG tablet Take 500 mg by mouth every 6 (six) hours as needed for moderate pain.  Marland Kitchen amLODipine (NORVASC) 2.5 MG tablet Take 1 tablet (2.5 mg total) by mouth daily as needed.  Marland Kitchen aspirin EC 81 MG tablet Take 81 mg by mouth daily.  Marland Kitchen atorvastatin (LIPITOR) 10 MG tablet Take 10 mg by mouth at bedtime.  . Blood Glucose Monitoring Suppl (ACCU-CHEK GUIDE) w/Device KIT   . brimonidine (ALPHAGAN) 0.2 % ophthalmic solution Place 1 drop into both eyes 3 (three) times daily.  . bumetanide (BUMEX) 1 MG tablet Take 1 tablet (1 mg total) by mouth daily.  Marland Kitchen CALCIUM PO Take 600 mg by mouth daily.   . cetirizine (ZYRTEC) 10 MG tablet Take 10 mg by mouth daily.  Marland Kitchen CHERATUSSIN AC 100-10 MG/5ML syrup Take 5 mLs by mouth daily as needed.   . Cholecalciferol (VITAMIN D-3 PO) Take 1 tablet by mouth daily.  . cyclobenzaprine (FLEXERIL) 10 MG tablet Take 10 mg by mouth as needed.   . dicyclomine (BENTYL) 10 MG capsule Take 10 mg by mouth daily.  . dorzolamide (TRUSOPT) 2 %  ophthalmic solution Place 1 drop into both eyes 3 (three) times daily.  . ferrous sulfate 325 (65 FE) MG tablet Take 325 mg by mouth daily with breakfast.  . fluticasone (FLONASE) 50 MCG/ACT nasal spray USE 2 SPRAY(S) IN EACH NOSTRIL ONCE DAILY FOR 30 DAYS  . ibuprofen (ADVIL,MOTRIN) 600 MG tablet Take 600 mg by mouth every 6 (six) hours as needed.  . Latanoprostene Bunod (VYZULTA) 0.024 % SOLN Apply 1 drop to eye 1 day or 1 dose.  . losartan (COZAAR) 100 MG tablet Take 100 mg by mouth daily.  . magnesium oxide (MAG-OX) 400 MG tablet Take 1 tablet (400 mg total) by mouth every other day.  . metFORMIN (GLUCOPHAGE) 500 MG tablet Take 1,000 mg by mouth 2 (two) times daily with  a meal.   . potassium chloride (MICRO-K) 10 MEQ CR capsule Take 40 mEq by mouth daily.   . prochlorperazine (COMPAZINE) 10 MG tablet Take 10 mg by mouth every 6 (six) hours as needed for nausea or vomiting.  . Tiotropium Bromide Monohydrate (SPIRIVA RESPIMAT) 1.25 MCG/ACT AERS Take 2.5 mcg by mouth as needed.  . TRELEGY ELLIPTA 100-62.5-25 MCG/INH AEPB INHALE 1 PUFF ONCE DAILY  . vitamin C (ASCORBIC ACID) 500 MG tablet Take 500 mg by mouth daily.  Gillermina Phy 40 MG capsule TAKE 4 CAPSULES (160 MG TOTAL) BY MOUTH DAILY.  . [DISCONTINUED] Tiotropium Bromide Monohydrate (SPIRIVA RESPIMAT) 1.25 MCG/ACT AERS as needed.    No facility-administered encounter medications on file as of 09/06/2019.    ALLERGIES:  Allergies  Allergen Reactions  . Metoprolol      PHYSICAL EXAM:  ECOG Performance status: 1  Vitals:   09/06/19 1444  BP: (!) 148/86  Pulse: 75  Resp: 20  Temp: (!) 96.4 F (35.8 C)  SpO2: 96%   Filed Weights   09/06/19 1444  Weight: 227 lb (103 kg)    Physical Exam Vitals reviewed.  Constitutional:      Appearance: Normal appearance.  Cardiovascular:     Rate and Rhythm: Normal rate and regular rhythm.     Heart sounds: Normal heart sounds.  Pulmonary:     Effort: Pulmonary effort is normal.      Breath sounds: Normal breath sounds.  Abdominal:     General: There is no distension.     Palpations: Abdomen is soft. There is no mass.  Musculoskeletal:     Right lower leg: Edema present.     Left lower leg: Edema present.  Skin:    General: Skin is warm.  Neurological:     General: No focal deficit present.     Mental Status: He is alert and oriented to person, place, and time.  Psychiatric:        Mood and Affect: Mood normal.        Behavior: Behavior normal.      LABORATORY DATA:  I have reviewed the labs as listed.  CBC    Component Value Date/Time   WBC 11.1 (H) 08/30/2019 1547   RBC 4.49 08/30/2019 1547   HGB 12.1 (L) 08/30/2019 1547   HCT 38.2 (L) 08/30/2019 1547   PLT 373 08/30/2019 1547   MCV 85.1 08/30/2019 1547   MCH 26.9 08/30/2019 1547   MCHC 31.7 08/30/2019 1547   RDW 14.6 08/30/2019 1547   LYMPHSABS 1.4 08/30/2019 1547   MONOABS 1.0 08/30/2019 1547   EOSABS 0.3 08/30/2019 1547   BASOSABS 0.1 08/30/2019 1547   CMP Latest Ref Rng & Units 08/30/2019 07/06/2019 05/31/2019  Glucose 70 - 99 mg/dL 90 92 113(H)  BUN 8 - 23 mg/dL _0 Creatinine 0.61 - 1.24 mg/dL 0.93 0.92 0.89  Sodium 135 - 145 mmol/L 140 139 138  Potassium 3.5 - 5.1 mmol/L 4.1 4.1 4.5  Chloride 98 - 111 mmol/L 102 100 101  CO2 22 - 32 mmol/L _1 Calcium 8.9 - 10.3 mg/dL 9.8 9.6 9.4  Total Protein 6.5 - 8.1 g/dL 6.9 6.7 6.4(L)  Total Bilirubin 0.3 - 1.2 mg/dL 0.7 0.8 0.6  Alkaline Phos 38 - 126 U/L 45 44 45  AST 15 - 41 U/L 16 17 14(L)  ALT 0 - 44 U/L _2 DIAGNOSTIC IMAGING:  I have reviewed  the scans.   I have reviewed Venita Lick LPN's note and agree with the documentation.  I personally performed a face-to-face visit, made revisions and my assessment and plan is as follows.    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  Metastatic prostate cancer to the left supraclavicular and mediastinal lymph  nodes: -Docetaxel for 14 cycles, discontinued as his PSA plateaued around 11 from 08/11/2015 through 06/12/2016. -Abiraterone and prednisone from 07/13/2016 through 04/09/2019, discontinued secondary to PSA progression. -CT CAP on 10/14/2018 showed new small sclerotic lesions in T4 and T10.  But these are questionable based on no uptake on bone scan.  He also received Zometa which could alter bones. -Enzalutamide 80 mg daily started on 04/12/2019, increased to 160 mg daily on 05/31/2019. -Lupron 45 mg on 06/09/2019. -He feels sleepy during the day when he takes enzalutamide in the mornings.  I have suggested him to take in the evening.  He did not want to feel sleepy as he is more active in the evenings. -I have reviewed his labs.  His PSA has increased to 1.94.  This was previously 1.38 in February 2021.  I think he is progressing on enzalutamide. -I have recommended continuing enzalutamide at this time.  I plan to repeat CT CAP and a bone scan prior to next visit. -I have also talked to him about sending next generation sequencing.  He had left supraclavicular lymph node biopsied in [place] in 2017.  We will see if we could send that to [facility].  If it is not feasible, we will consider obtaining another biopsy based on the CT scan.  If not we will also consider guardant 360 testing.  2.  Androgen deprivation therapy induced bone loss: -He received Zometa every 3 months for a year completed on 10/29/2017. -He will continue calcium and vitamin D.  We will plan to repeat bone density in the future.  3.  Leg swelling: -He will continue Bumex 1 mg daily.  Feet are mildly swollen.  4.  Genetic testing: -Germline mutation testing was negative.  5.  Peripheral neuropathy: -His feet feel cold all the time.  No medical intervention necessary.    Orders placed this encounter:  Orders Placed This Encounter  Procedures  . CT Chest W Contrast  . CT Abdomen Pelvis W Contrast  . NM Bone Scan Whole  Body  . CBC with Differential/Platelet  . Comprehensive metabolic panel  . PSA      Derek Jack, MD [place] [phone]

## 2019-09-06 NOTE — Assessment & Plan Note (Signed)
1.  Metastatic prostate cancer to the left supraclavicular and mediastinal lymph nodes: -Docetaxel for 14 cycles, discontinued as his PSA plateaued around 11 from 08/11/2015 through 06/12/2016. -Abiraterone and prednisone from 07/13/2016 through 04/09/2019, discontinued secondary to PSA progression. -CT CAP on 10/14/2018 showed new small sclerotic lesions in T4 and T10.  But these are questionable based on no uptake on bone scan.  He also received Zometa which could alter bones. -Enzalutamide 80 mg daily started on 04/12/2019, increased to 160 mg daily on 05/31/2019. -Lupron 45 mg on 06/09/2019. -He feels sleepy during the day when he takes enzalutamide in the mornings.  I have suggested him to take in the evening.  He did not want to feel sleepy as he is more active in the evenings. -I have reviewed his labs.  His PSA has increased to 1.94.  This was previously 1.38 in February 2021.  I think he is progressing on enzalutamide. -I have recommended continuing enzalutamide at this time.  I plan to repeat CT CAP and a bone scan prior to next visit. -I have also talked to him about sending next generation sequencing.  He had left supraclavicular lymph node biopsied in [place] in 2017.  We will see if we could send that to [facility].  If it is not feasible, we will consider obtaining another biopsy based on the CT scan.  If not we will also consider guardant 360 testing.  2.  Androgen deprivation therapy induced bone loss: -He received Zometa every 3 months for a year completed on 10/29/2017. -He will continue calcium and vitamin D.  We will plan to repeat bone density in the future.  3.  Leg swelling: -He will continue Bumex 1 mg daily.  Feet are mildly swollen.  4.  Genetic testing: -Germline mutation testing was negative.  5.  Peripheral neuropathy: -His feet feel cold all the time.  No medical intervention necessary.

## 2019-09-06 NOTE — Patient Instructions (Addendum)
[place] at [facility] Discharge Instructions  You were seen today by Dr. Delton Coombes. He went over your recent lab results. Continue taking the Xtandi as directed. He repeat scans prior to your next visit. He will see you back in 2 months for labs and follow up.   Thank you for choosing [place] at [facility] to provide your oncology and hematology care.  To afford each patient quality time with our provider, please arrive at least 15 minutes before your scheduled appointment time.   If you have a lab appointment with the [facility] please come in thru the  Main Entrance and check in at the main information desk  You need to re-schedule your appointment should you arrive 10 or more minutes late.  We strive to give you quality time with our providers, and arriving late affects you and other patients whose appointments are after yours.  Also, if you no show three or more times for appointments you may be dismissed from the clinic at the providers discretion.     Again, thank you for choosing [facility].  Our hope is that these requests will decrease the amount of time that you wait before being seen by our physicians.       _____________________________________________________________  Should you have questions after your visit to [facility], please contact our office at [phone] between the hours of 8:00 a.m. and 4:30 p.m.  Voicemails left after 4:00 p.m. will not be returned until the following business day.  For prescription refill requests, have your pharmacy contact our office and allow 72 hours.    Cancer Center Support Programs:   > Cancer Support Group  2nd Tuesday of the month 1pm-2pm, Journey Room

## 2019-09-08 ENCOUNTER — Encounter (HOSPITAL_COMMUNITY): Payer: Self-pay | Admitting: *Deleted

## 2019-09-08 NOTE — Progress Notes (Signed)
I placed order with [facility] for foundation one testing. Specimen is at [facility] in [place], [place].  Their accession # is [id number].  Paperwork was faxed to [facility] with the order form.

## 2019-09-12 ENCOUNTER — Other Ambulatory Visit (HOSPITAL_COMMUNITY): Payer: Self-pay | Admitting: Nurse Practitioner

## 2019-09-12 DIAGNOSIS — C61 Malignant neoplasm of prostate: Secondary | ICD-10-CM

## 2019-09-12 DIAGNOSIS — C771 Secondary and unspecified malignant neoplasm of intrathoracic lymph nodes: Secondary | ICD-10-CM

## 2019-09-20 ENCOUNTER — Other Ambulatory Visit (HOSPITAL_COMMUNITY): Payer: Self-pay | Admitting: *Deleted

## 2019-09-20 MED ORDER — SPIRIVA RESPIMAT 1.25 MCG/ACT IN AERS: 2.5000 ug | INHALATION_SPRAY | RESPIRATORY_TRACT | 4 refills | Status: DC | PRN

## 2019-09-22 MED FILL — XTANDI 40 MG CAPSULE: 40 | 30 days supply | Qty: 120 | Fill #0

## 2019-10-02 ENCOUNTER — Other Ambulatory Visit (HOSPITAL_COMMUNITY): Payer: Self-pay | Admitting: *Deleted

## 2019-10-02 MED ORDER — SPIRIVA RESPIMAT 1.25 MCG/ACT IN AERS: 1.2500 ug | INHALATION_SPRAY | RESPIRATORY_TRACT | 4 refills | Status: DC | PRN

## 2019-10-03 ENCOUNTER — Other Ambulatory Visit (HOSPITAL_COMMUNITY): Payer: Self-pay | Admitting: *Deleted

## 2019-10-03 MED ORDER — SPIRIVA RESPIMAT 1.25 MCG/ACT IN AERS: 1.2500 ug | INHALATION_SPRAY | RESPIRATORY_TRACT | 4 refills | Status: DC | PRN

## 2019-10-16 ENCOUNTER — Other Ambulatory Visit (HOSPITAL_COMMUNITY): Payer: Self-pay | Admitting: Nurse Practitioner

## 2019-10-16 DIAGNOSIS — C61 Malignant neoplasm of prostate: Secondary | ICD-10-CM

## 2019-10-16 DIAGNOSIS — C771 Secondary and unspecified malignant neoplasm of intrathoracic lymph nodes: Secondary | ICD-10-CM

## 2019-10-16 IMAGING — CT CT CHEST WITH CONTRAST
2 of 5 series · 12 of 36 positions shown, 15 images · IV contrast (Isovue)
Comparison: 07/03/2015 CT chest.

CLINICAL DATA: Prostate cancer treated with chemotherapy.

EXAM:
CT CHEST, ABDOMEN, AND PELVIS WITH CONTRAST
TECHNIQUE: Multidetector CT imaging of the chest, abdomen and pelvis was
performed following the standard protocol during bolus
administration of intravenous contrast.
CONTRAST:  100mL OMNIPAQUE IOHEXOL 300 MG/ML  SOLN

[Series 2: cap with · axial · 0.78mm/px · z∈[+988,+1518]mm · 9 of 134 slices shown, 12 images]
[im 14/134  mediastinal]
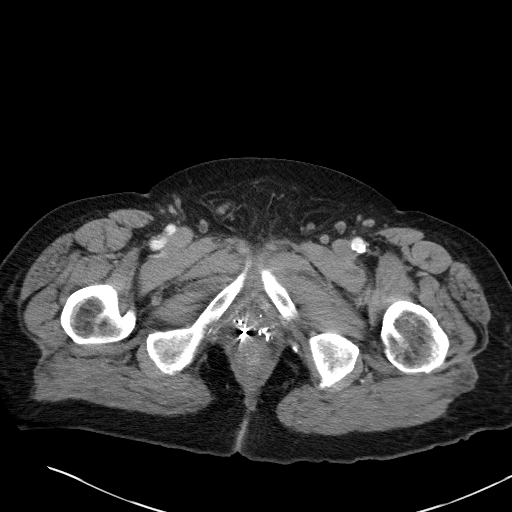
[im 14/134  lung]
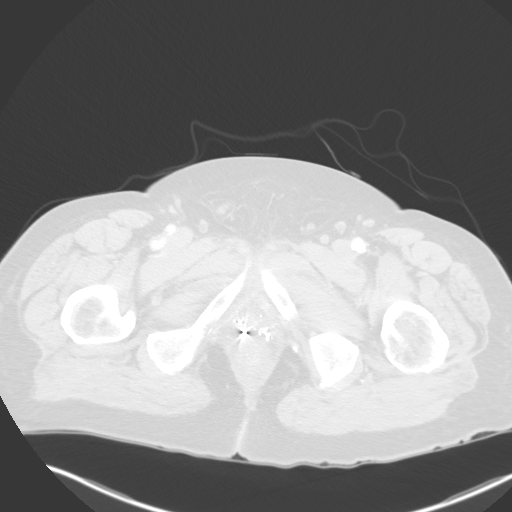
[im 27/134  lung]
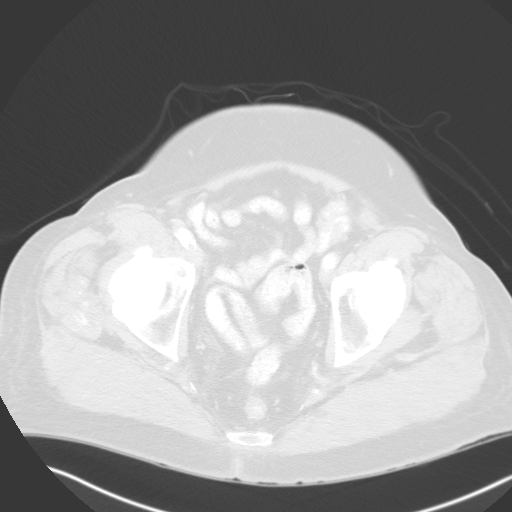
[im 40/134  lung]
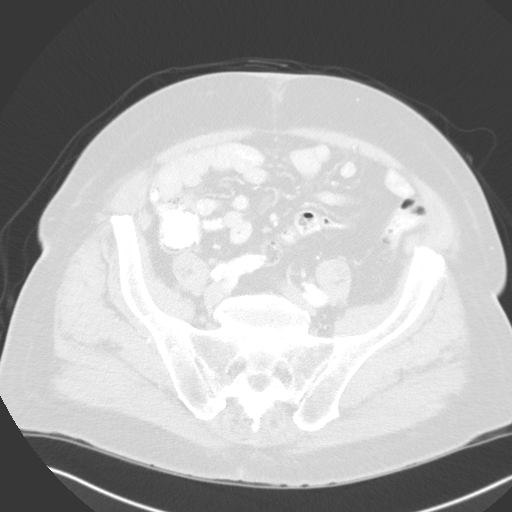
[im 54/134  lung]
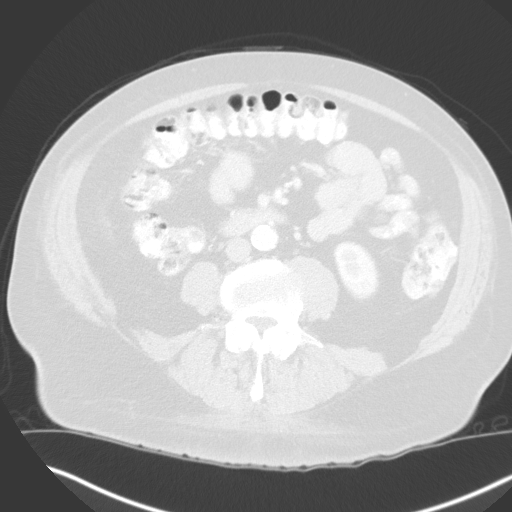
[im 67/134  mediastinal]
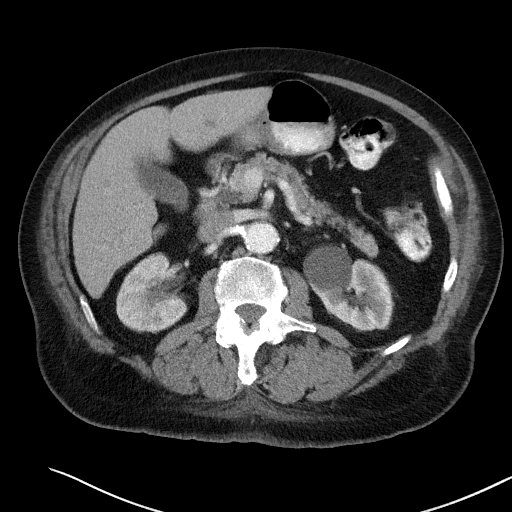
[im 67/134  lung]
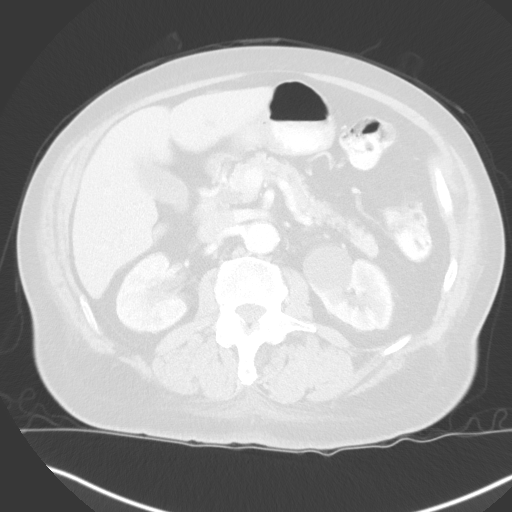
[im 80/134  lung]
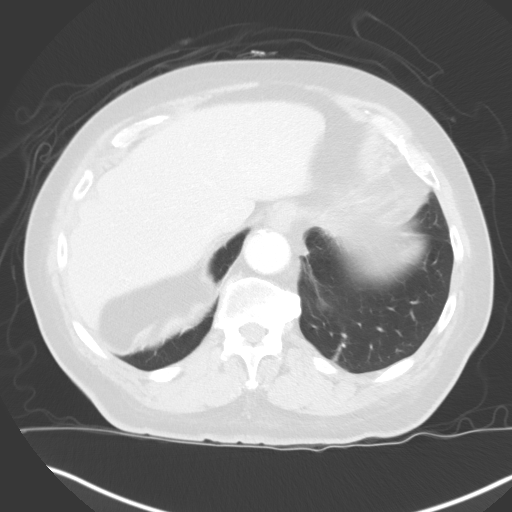
[im 94/134  lung]
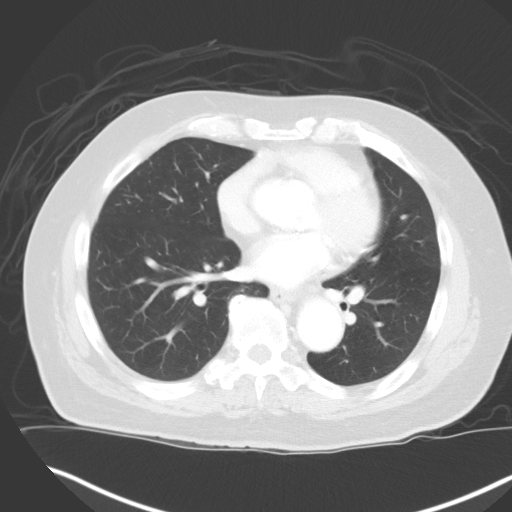
[im 107/134  lung]
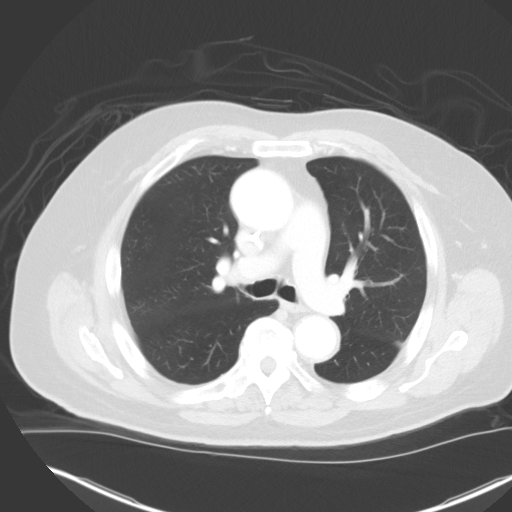
[im 120/134  mediastinal]
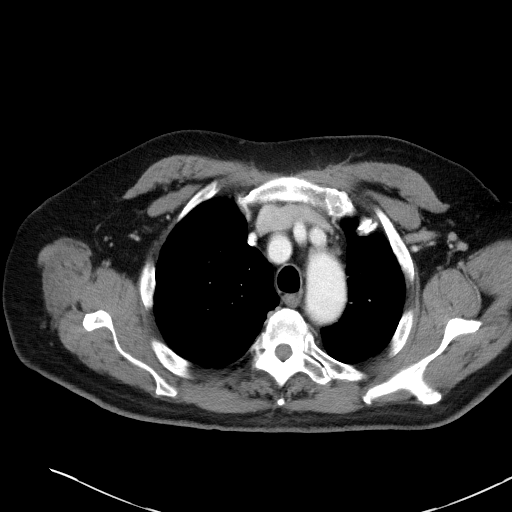
[im 120/134  lung]
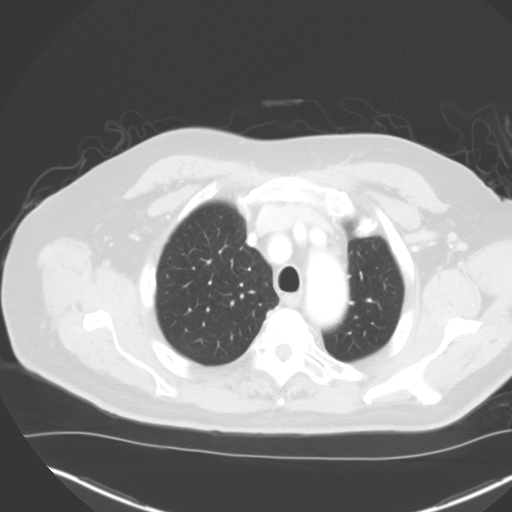

[Series 4: coronals · coronal · 0.88mm/px · 3 of 155 slices shown]
[im 31/155  lung]
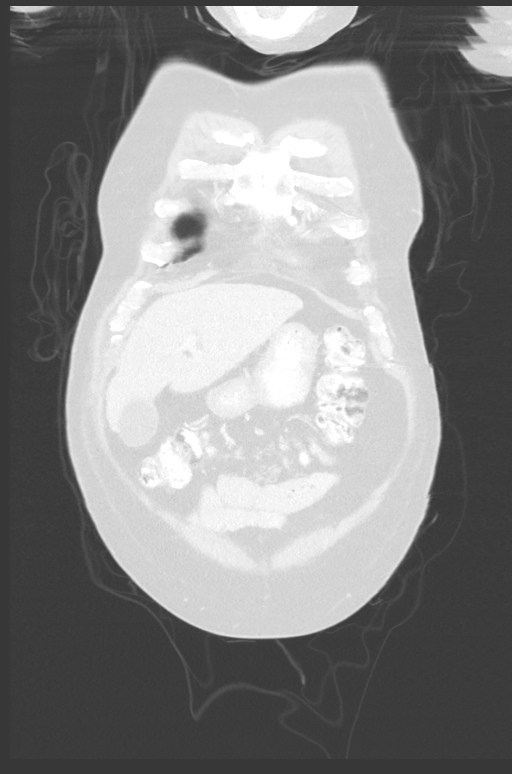
[im 62/155  lung]
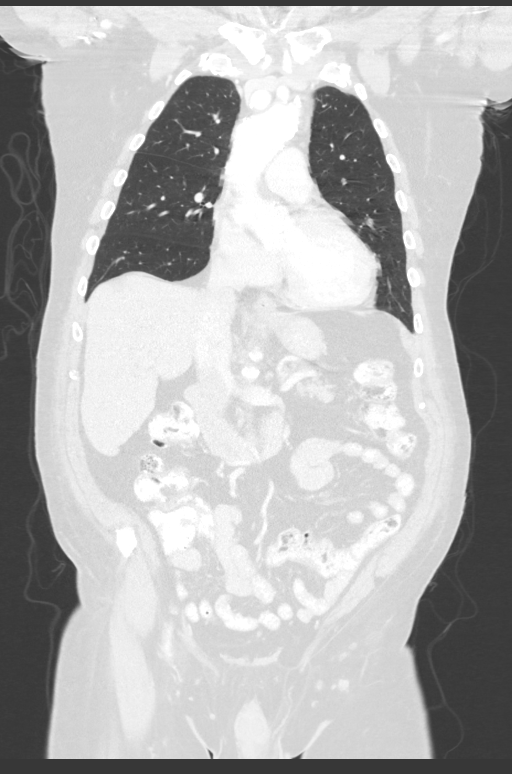
[im 93/155  lung]
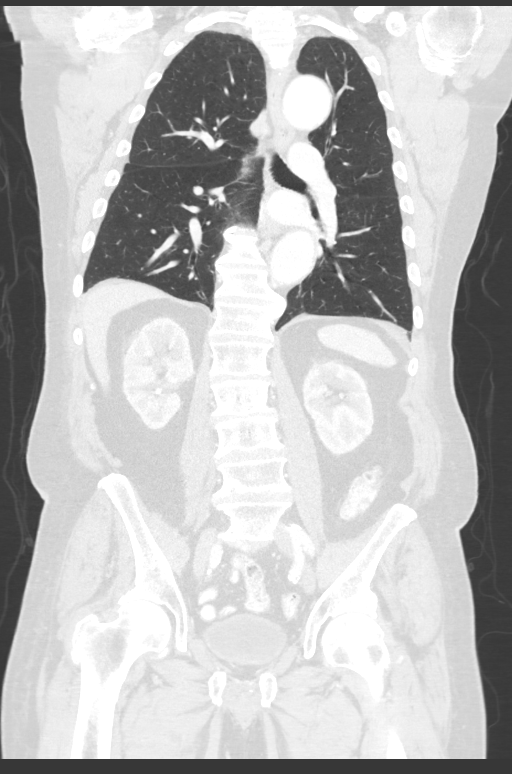

[12 of 36 positions shown; findings below may reference images not displayed]

FINDINGS: CT CHEST FINDINGS

Cardiovascular: Atherosclerotic calcification of the aorta, aortic
valve and coronary arteries. Ascending aorta measures 4.6 cm and
distal aortic arch measures 4.5 cm. Descending thoracic aorta
measures up to 3.6 cm. Heart size normal. No pericardial effusion.

Mediastinum/Nodes: Mediastinal lymph nodes are not enlarged by CT
size criteria. No hilar or axillary adenopathy. Esophagus is grossly
unremarkable.

Lungs/Pleura: Centrilobular emphysema. A few subpleural nodules
measure 4 mm or less in size and appear similar to 07/03/2015.
Minimal scarring in the lower lobes. No pleural fluid. Airway is
unremarkable.

Musculoskeletal: Sclerotic lesions in T4 and T10 appear new from
07/03/2015. Focal sclerosis in the upper sternum is unchanged.

CT ABDOMEN PELVIS FINDINGS

Hepatobiliary: Subcentimeter low-attenuation lesions in the left
hepatic lobe measure up to 10 mm and are too small to characterize.
Liver margin is slightly irregular. Liver and gallbladder are
otherwise unremarkable. No biliary ductal dilatation.

Pancreas: Negative.

Spleen: Negative.

Adrenals/Urinary Tract: Adrenal glands are unremarkable. Stones and
probable renal sinus cysts are seen in the right kidney.
Low-attenuation lesions in the left kidney measure up to 4.0 cm and
are indicative of cysts. Kidneys are otherwise unremarkable. Ureters
are decompressed. Bladder is low in volume.

Stomach/Bowel: Stomach, small bowel, appendix and colon are
unremarkable.

Vascular/Lymphatic: Atherosclerotic calcification of the aorta
without aneurysm. No pathologically enlarged lymph nodes.

Reproductive: Brachytherapy seeds in the prostate.

Other: Small bilateral inguinal hernias contain fat. No free fluid.
Mesenteries and peritoneum are unremarkable.

Musculoskeletal: Degenerative changes in the spine. No additional
sclerotic lesions.
IMPRESSION: 1. New small sclerotic lesions in T4 and T10. Metastatic disease
cannot be excluded. No additional evidence of metastatic disease.
2. Liver appears cirrhotic.
3. Right renal stones.
4. Ascending Aortic aneurysm NOS (OFIOB-7VC.8). Ascending thoracic
aortic aneurysm. Recommend semi-annual imaging followup by CTA or
MRA and referral to cardiothoracic surgery if not already obtained.
This recommendation follows 5676
ACCF/AHA/AATS/ACR/ASA/SCA/HILLARY/MIHO/SURA/POROSH Guidelines for the
Diagnosis and Management of Patients With Thoracic Aortic Disease.
Circulation. 5676; 121: E266-e369. Aortic aneurysm NOS
(OFIOB-7VC.8).
5.  Aortic atherosclerosis (OFIOB-170.0).
6.  Emphysema (OFIOB-Z1N.U).

## 2019-10-19 MED FILL — XTANDI 40 MG CAPSULE: 40 | 30 days supply | Qty: 120 | Fill #0

## 2019-11-03 ENCOUNTER — Other Ambulatory Visit (HOSPITAL_COMMUNITY): Payer: Medicare PPO

## 2019-11-03 ENCOUNTER — Other Ambulatory Visit: Payer: Self-pay

## 2019-11-03 ENCOUNTER — Inpatient Hospital Stay (HOSPITAL_COMMUNITY): Payer: Medicare PPO | Attending: Hematology

## 2019-11-03 DIAGNOSIS — C61 Malignant neoplasm of prostate: Secondary | ICD-10-CM

## 2019-11-03 DIAGNOSIS — C771 Secondary and unspecified malignant neoplasm of intrathoracic lymph nodes: Secondary | ICD-10-CM | POA: Insufficient documentation

## 2019-11-03 LAB — COMPREHENSIVE METABOLIC PANEL
ALT: 8 U/L (ref 0–44)
AST: 16 U/L (ref 15–41)
Albumin: 3.9 g/dL (ref 3.5–5.0)
Alkaline Phosphatase: 49 U/L (ref 38–126)
Anion gap: 13 (ref 5–15)
BUN: 15 mg/dL (ref 8–23)
CO2: 27 mmol/L (ref 22–32)
Calcium: 9.9 mg/dL (ref 8.9–10.3)
Chloride: 99 mmol/L (ref 98–111)
Creatinine, Ser: 0.93 mg/dL (ref 0.61–1.24)
GFR calc Af Amer: >60 mL/min (ref >60)
GFR calc non Af Amer: >60 mL/min (ref >60)
Glucose, Bld: 120 mg/dL — ABNORMAL HIGH (ref 70–99)
Potassium: 4.1 mmol/L (ref 3.5–5.1)
Sodium: 139 mmol/L (ref 135–145)
Total Bilirubin: 0.6 mg/dL (ref 0.3–1.2)
Total Protein: 6.7 g/dL (ref 6.5–8.1)

## 2019-11-03 LAB — CBC WITH DIFFERENTIAL/PLATELET
Abs Immature Granulocytes: 0.02 10*3/uL (ref 0.00–0.07)
Basophils Absolute: 0.1 10*3/uL (ref 0.0–0.1)
Basophils Relative: 1 %
Eosinophils Absolute: 0.3 10*3/uL (ref 0.0–0.5)
Eosinophils Relative: 3 %
HCT: 36.7 % — ABNORMAL LOW (ref 39.0–52.0)
Hemoglobin: 11.3 g/dL — ABNORMAL LOW (ref 13.0–17.0)
Immature Granulocytes: 0 %
Lymphocytes Relative: 15 %
Lymphs Abs: 1.1 10*3/uL (ref 0.7–4.0)
MCH: 26.8 pg (ref 26.0–34.0)
MCHC: 30.8 g/dL (ref 30.0–36.0)
MCV: 87.2 fL (ref 80.0–100.0)
Monocytes Absolute: 0.8 10*3/uL (ref 0.1–1.0)
Monocytes Relative: 10 %
Neutro Abs: 5.5 10*3/uL (ref 1.7–7.7)
Neutrophils Relative %: 71 %
Platelets: 321 10*3/uL (ref 150–400)
RBC: 4.21 MIL/uL — ABNORMAL LOW (ref 4.22–5.81)
RDW: 14.4 % (ref 11.5–15.5)
WBC: 7.8 10*3/uL (ref 4.0–10.5)
nRBC: 0 % (ref 0.0–0.2)

## 2019-11-03 LAB — PSA: Prostatic Specific Antigen: 2.32 ng/mL (ref 0.00–4.00)

## 2019-11-10 ENCOUNTER — Encounter (HOSPITAL_COMMUNITY): Payer: Self-pay

## 2019-11-10 ENCOUNTER — Encounter (HOSPITAL_COMMUNITY)
Admission: RE | Admit: 2019-11-10 | Discharge: 2019-11-10 | Disposition: A | Discharge status: Home / Self Care | Payer: Medicare PPO | Source: Ambulatory Visit | Attending: Hematology | Admitting: Hematology

## 2019-11-10 ENCOUNTER — Other Ambulatory Visit: Payer: Self-pay

## 2019-11-10 ENCOUNTER — Ambulatory Visit (HOSPITAL_COMMUNITY)
Admission: RE | Admit: 2019-11-10 | Discharge: 2019-11-10 | Disposition: A | Discharge status: Home / Self Care | Payer: Medicare PPO | Source: Ambulatory Visit | Attending: Hematology | Admitting: Hematology

## 2019-11-10 ENCOUNTER — Encounter (HOSPITAL_BASED_OUTPATIENT_CLINIC_OR_DEPARTMENT_OTHER)
Admission: RE | Admit: 2019-11-10 | Discharge: 2019-11-10 | Disposition: A | Discharge status: Home / Self Care | Payer: Medicare PPO | Source: Ambulatory Visit | Attending: Hematology | Admitting: Hematology

## 2019-11-10 DIAGNOSIS — C771 Secondary and unspecified malignant neoplasm of intrathoracic lymph nodes: Secondary | ICD-10-CM

## 2019-11-10 DIAGNOSIS — C61 Malignant neoplasm of prostate: Secondary | ICD-10-CM

## 2019-11-10 MED ORDER — TECHNETIUM TC 99M MEDRONATE IV KIT
20.0000 | PACK | Freq: Once | INTRAVENOUS | Status: AC | PRN
  Administered 2019-11-10: 21 via INTRAVENOUS

## 2019-11-10 MED ORDER — IOHEXOL 300 MG/ML  SOLN
100.0000 mL | Freq: Once | INTRAMUSCULAR | Status: AC | PRN
  Administered 2019-11-10: 100 mL via INTRAVENOUS

## 2019-11-15 ENCOUNTER — Other Ambulatory Visit (HOSPITAL_COMMUNITY): Payer: Self-pay | Admitting: Nurse Practitioner

## 2019-11-15 DIAGNOSIS — C61 Malignant neoplasm of prostate: Secondary | ICD-10-CM

## 2019-11-15 DIAGNOSIS — C771 Secondary and unspecified malignant neoplasm of intrathoracic lymph nodes: Secondary | ICD-10-CM

## 2019-11-21 MED FILL — XTANDI 40 MG CAPSULE: 40 | 30 days supply | Qty: 120 | Fill #0

## 2019-11-30 ENCOUNTER — Inpatient Hospital Stay (HOSPITAL_COMMUNITY): Payer: Medicare PPO | Attending: Hematology | Admitting: Hematology

## 2019-11-30 ENCOUNTER — Other Ambulatory Visit: Payer: Self-pay

## 2019-11-30 VITALS — BP 141/72 | HR 62 | Temp 97.1°F | Resp 18 | Wt 225.4 lb

## 2019-11-30 DIAGNOSIS — Z79899 Other long term (current) drug therapy: Secondary | ICD-10-CM | POA: Insufficient documentation

## 2019-11-30 DIAGNOSIS — G629 Polyneuropathy, unspecified: Secondary | ICD-10-CM | POA: Insufficient documentation

## 2019-11-30 DIAGNOSIS — Z7982 Long term (current) use of aspirin: Secondary | ICD-10-CM | POA: Insufficient documentation

## 2019-11-30 DIAGNOSIS — C770 Secondary and unspecified malignant neoplasm of lymph nodes of head, face and neck: Secondary | ICD-10-CM | POA: Insufficient documentation

## 2019-11-30 DIAGNOSIS — C771 Secondary and unspecified malignant neoplasm of intrathoracic lymph nodes: Secondary | ICD-10-CM

## 2019-11-30 DIAGNOSIS — C61 Malignant neoplasm of prostate: Secondary | ICD-10-CM

## 2019-11-30 DIAGNOSIS — M7989 Other specified soft tissue disorders: Secondary | ICD-10-CM | POA: Insufficient documentation

## 2019-11-30 NOTE — Progress Notes (Signed)
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 Docetaxel x 14 cycles from 08/11/2015 through 06/12/2016 2. Abiraterone from 07/13/2016 through 04/09/2019  CURRENT THERAPY: Enzalutamide  BRIEF ONCOLOGIC HISTORY:  Oncology History  Prostate cancer metastatic to intrathoracic lymph node ([place])  07/24/2015 Initial Diagnosis   Prostate cancer metastatic to the left supraclavicular and anterior mediastinal lymph nodes   08/21/2015 - 06/12/2016 Chemotherapy   Docetaxel every 3 weeks for 14 cycles, discontinued as PSA has plateaued around 11    07/13/2016 Treatment Plan Change   Zytiga 1000 milligrams daily along with prednisone 5 mg daily, started secondary to fluid overload from docetaxel   10/14/2018 Genetic Testing   Negative genetic testing.  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  The Common Hereditary Gene Panel offered by [facility] includes sequencing and/or deletion duplication testing of the following 48 genes: APC, ATM, AXIN2, BARD1, BMPR1A, BRCA1, BRCA2, BRIP1, CDH1, CDK4, CDKN2A (p14ARF), CDKN2A (p16INK4a), CHEK2, CTNNA1, DICER1, EPCAM (Deletion/duplication testing only), GREM1 (promoter region deletion/duplication testing only), KIT, MEN1, MLH1, MSH2, MSH3, MSH6, MUTYH, NBN, NF1, NHTL1, PALB2, PDGFRA, PMS2, POLD1, POLE, PTEN, RAD50, RAD51C, RAD51D, RNF43, SDHB, SDHC, SDHD, SMAD4, SMARCA4. STK11, TP53, TSC1, TSC2, and VHL.  The following genes were evaluated for sequence changes only: SDHA and HOXB13 c.251G>A variant only. The report date is Oct 14, 2018.   09/18/2019 Genetic Testing   Foundation One CDx        CANCER STAGING: Cancer Staging No matching staging information was found for the patient.  INTERVAL  HISTORY:  Mr. SIR MALLIS, a 79 y.o. 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 Jesselee was last seen on 09/06/2019.  Today he is accompanied by his daughter. He jammed his leg on 10/13/2019 and was taken to [facility] ED, which was negative for fractures and clots. Today he reports feeling well and is healing nicely. He is taking 4 tablets of Xtandi and is tolerating it well, except for hot flashes. He denies having any more pains.   REVIEW OF SYSTEMS:  Review of Systems  Constitutional: Positive for fatigue (mild). Negative for appetite change.  Endocrine: Positive for hot flashes (from [place]).  All other systems reviewed and are negative.   PAST MEDICAL/SURGICAL HISTORY:  Past Medical History:  Diagnosis Date  . Cancer [facility])    prostate  . COPD (chronic obstructive pulmonary disease) ([place])   . Diabetes mellitus without complication ([place])   . Family history of ovarian cancer   . Family history of prostate cancer   . Hypertension    Past Surgical History:  Procedure Laterality Date  . GOLD SEED IMPLANT      SOCIAL HISTORY:  Social History   Socioeconomic History  . Marital status: Widowed    Spouse name: Not on file  . Number of children: Not on file  . Years of education: Not on file  . Highest education level: Not on file  Occupational History  . Not on file  Tobacco Use  . Smoking status: Former Smoker    Types: Cigarettes    Quit date: 06/25/2014    Years since quitting: 5.4  . Smokeless tobacco: Never Used  . Tobacco comment: smoked since young age, quit  in 2016  Substance and Sexual Activity  . Alcohol use: Not Currently  . Drug use: No  . Sexual activity: Not on file  Other Topics Concern  . Not on file  Social History Narrative  . Not on file   Social Determinants of Health   Financial Resource Strain:   . Difficulty of Paying Living Expenses:   Food Insecurity:   . Worried About Ship broker in the Last Year:   . Arboriculturist in the Last Year:   Transportation Needs:   . Film/video editor (Medical):   Marland Kitchen Lack of Transportation (Non-Medical):   Physical Activity:   . Days of Exercise per Week:   . Minutes of Exercise per Session:   Stress:   . Feeling of Stress :   Social Connections:   . Frequency of Communication with Friends and Family:   . Frequency of Social Gatherings with Friends and Family:   . Attends Religious Services:   . Active Member of Clubs or Organizations:   . Attends Archivist Meetings:   Marland Kitchen Marital Status:   Intimate Partner Violence:   . Fear of Current or Ex-Partner:   . Emotionally Abused:   Marland Kitchen Physically Abused:   . Sexually Abused:     FAMILY HISTORY:  Family History  Problem Relation Age of Onset  . Ovarian cancer Mother 16  . Heart attack Father   . Prostate cancer Maternal Uncle 89  . Cancer Cousin        pat cousin with unknown cancer    CURRENT MEDICATIONS:  Current Outpatient Medications  Medication Sig Dispense Refill  . ACCU-CHEK GUIDE test strip     . Accu-Chek Softclix Lancets lancets     . amLODipine (NORVASC) 2.5 MG tablet Take 1 tablet (2.5 mg total) by mouth daily as needed. 30 tablet 0  . aspirin EC 81 MG tablet Take 81 mg by mouth daily.    Marland Kitchen atorvastatin (LIPITOR) 10 MG tablet Take 10 mg by mouth at bedtime.    . Blood Glucose Monitoring Suppl (ACCU-CHEK GUIDE) w/Device KIT     . bumetanide (BUMEX) 1 MG tablet Take 1 tablet (1 mg total) by mouth daily. 30 tablet 6  . CALCIUM PO Take 600 mg by mouth daily.     . cetirizine (ZYRTEC) 10 MG tablet Take 10 mg by mouth daily.    Marland Kitchen CHERATUSSIN AC 100-10 MG/5ML syrup Take 5 mLs by mouth daily as needed.   0  . Cholecalciferol (VITAMIN D-3 PO) Take 1 tablet by mouth daily.    . cyclobenzaprine (FLEXERIL) 10 MG tablet Take 10 mg by mouth as needed.   0  . dicyclomine (BENTYL) 10 MG capsule Take 10 mg by mouth daily.    . dorzolamide (TRUSOPT) 2 %  ophthalmic solution Place 1 drop into both eyes 3 (three) times daily.    . ferrous sulfate 325 (65 FE) MG tablet Take 325 mg by mouth daily with breakfast.    . fluticasone (FLONASE) 50 MCG/ACT nasal spray USE 2 SPRAY(S) IN EACH NOSTRIL ONCE DAILY FOR 30 DAYS  12  . Latanoprostene Bunod (VYZULTA) 0.024 % SOLN Apply 1 drop to eye 1 day or 1 dose.    . losartan (COZAAR) 100 MG tablet Take 100 mg by mouth daily.    . magnesium oxide (MAG-OX) 400 MG tablet Take 1 tablet (400 mg total) by mouth every other day. 90 tablet 2  . metFORMIN (GLUCOPHAGE)  500 MG tablet Take 1,000 mg by mouth 2 (two) times daily with a meal.   3  . potassium chloride (MICRO-K) 10 MEQ CR capsule Take 40 mEq by mouth daily.     . Tiotropium Bromide Monohydrate (SPIRIVA RESPIMAT) 1.25 MCG/ACT AERS Take 1.25 mcg by mouth as needed (Daily as needed). 4 g 4  . TRELEGY ELLIPTA 100-62.5-25 MCG/INH AEPB INHALE 1 PUFF ONCE DAILY    . vitamin C (ASCORBIC ACID) 500 MG tablet Take 500 mg by mouth daily.    Gillermina Phy 40 MG capsule TAKE 4 CAPSULES (160 MG TOTAL) BY MOUTH DAILY. 120 capsule 0  . acetaminophen (TYLENOL) 500 MG tablet Take 500 mg by mouth every 6 (six) hours as needed for moderate pain. (Patient not taking: Reported on 11/30/2019)    . ibuprofen (ADVIL,MOTRIN) 600 MG tablet Take 600 mg by mouth every 6 (six) hours as needed. (Patient not taking: Reported on 11/30/2019)  0  . prochlorperazine (COMPAZINE) 10 MG tablet Take 10 mg by mouth every 6 (six) hours as needed for nausea or vomiting. (Patient not taking: Reported on 11/30/2019)     No current facility-administered medications for this visit.    ALLERGIES:  Allergies  Allergen Reactions  . Metoprolol     PHYSICAL EXAM:  Performance status (ECOG): 1 - Symptomatic but completely ambulatory  Vitals:   11/30/19 1528  BP: (!) 141/72  Pulse: 62  Resp: 18  Temp: (!) 97.1 F (36.2 C)  SpO2: 100%   Wt Readings from Last 3 Encounters:  11/30/19 102.2 kg (225 lb 6.4 oz)    09/06/19 103 kg (227 lb)  07/06/19 104.7 kg (230 lb 14.4 oz)   Physical Exam Vitals reviewed.  Constitutional:      Appearance: Normal appearance. He is obese.  Neurological:     General: No focal deficit present.     Mental Status: He is alert and oriented to person, place, and time.  Psychiatric:        Mood and Affect: Mood normal.        Behavior: Behavior normal.      LABORATORY DATA:  I have reviewed the labs as listed.  CBC Latest Ref Rng & Units 11/03/2019 08/30/2019 07/06/2019  WBC 4.0 - 10.5 K/uL 7.8 11.1(H) 10.4  Hemoglobin 13.0 - 17.0 g/dL 11.3(L) 12.1(L) 11.8(L)  Hematocrit 39 - 52 % 36.7(L) 38.2(L) 37.4(L)  Platelets 150 - 400 K/uL 321 373 373   CMP Latest Ref Rng & Units 11/03/2019 08/30/2019 07/06/2019  Glucose 70 - 99 mg/dL 120(H) 90 92  BUN 8 - 23 mg/dL _0 Creatinine 0.61 - 1.24 mg/dL 0.93 0.93 0.92  Sodium 135 - 145 mmol/L 139 140 139  Potassium 3.5 - 5.1 mmol/L 4.1 4.1 4.1  Chloride 98 - 111 mmol/L 99 102 100  CO2 22 - 32 mmol/L _1 Calcium 8.9 - 10.3 mg/dL 9.9 9.8 9.6  Total Protein 6.5 - 8.1 g/dL 6.7 6.9 6.7  Total Bilirubin 0.3 - 1.2 mg/dL 0.6 0.7 0.8  Alkaline Phos 38 - 126 U/L 49 45 44  AST 15 - 41 U/L _2 ALT 0 - 44 U/L _3 Ref Range & Units 11/03/2019 08/30/2019 07/06/2019 03/30/2019  PSA 0.00 - 4.00 ng/mL 2.32  1.94 1.38 0.93     DIAGNOSTIC IMAGING:  I have independently reviewed the scans and discussed with the patient. CT Chest W Contrast  Result Date: 11/10/2019 CLINICAL DATA:  Prostate cancer with nodal involvement, restaging assessment EXAM: CT CHEST, ABDOMEN, AND PELVIS WITH CONTRAST TECHNIQUE: Multidetector CT imaging of the chest, abdomen and pelvis was performed following the standard protocol during bolus administration of intravenous contrast. CONTRAST:  17m OMNIPAQUE IOHEXOL 300 MG/ML  SOLN COMPARISON:  10/14/2018 FINDINGS: CT CHEST FINDINGS Cardiovascular: Coronary, aortic arch, and branch vessel  atherosclerotic vascular disease. Calcifications of the mitral and aortic valve. Stable aortic aneurysm 4.5 cm in diameter, measured on image 30/2. Mild cardiomegaly. Mediastinum/Nodes: Suspected small diverticulum of the distal esophagus along its left side, image 53/2. Otherwise unremarkable. Lungs/Pleura: Stable benign-appearing minimal subpleural nodularity in the right lung. Stable scarring in the left lower lobe. No new or worrisome nodules. Musculoskeletal: Sclerotic right medial clavicle adjacent to the sternoclavicular joint with asymmetric spurring, probably a manifestation of asymmetric degenerative sternoclavicular arthropathy. Stable 0.9 cm sclerotic lesion in the T10 vertebral body on image 124/5. Tiny sclerotic lesion along the posteroinferior corner of T4 is likewise stable and nonspecific. No significant new bony lesion is observed. CT ABDOMEN PELVIS FINDINGS Hepatobiliary: Stable hypodense left hepatic lobe lesions are technically too small to characterize. No new or enlarging lesions identified. Gallbladder unremarkable. Continued upper normal size of the extrahepatic biliary tree. Slightly nodular hepatic contour could reflect early cirrhosis. Pancreas: Unremarkable Spleen: Unremarkable Adrenals/Urinary Tract: Both adrenal glands appear normal. There is right hydronephrosis and right proximal hydroureter extending down to a 0.8 by 0.8 by 0.6 cm proximal ureteral stone at about the L3 vertebral level. The ureter distal to this stone is of normal caliber. Left renal cysts are again noted. Nonobstructive 10 mm right kidney lower pole calculus on image 78/4. Vascular calcification versus 2 mm nonobstructive renal calculus in the left kidney lower pole on image 84/4. Stomach/Bowel: Scattered descending and sigmoid colon diverticula. Prominent stool throughout the colon favors constipation. Vascular/Lymphatic: Aortoiliac atherosclerotic vascular disease. Currently no pathologic adenopathy is  identified. Reproductive: Brachytherapy seed implants in the prostate gland. Other: No supplemental non-categorized findings. Musculoskeletal: Lumbar spondylosis and degenerative disc disease. Degenerative arthropathy of both hips. No compelling findings of osseous metastatic disease in the region. Small direct left inguinal hernia contains adipose tissue. IMPRESSION: 1. No findings of active malignancy. Stable small sclerotic lesions at T4 and T10, nonspecific. 2. Right hydronephrosis and right proximal hydroureter extending down to a 0.8 by 0.8 by 0.6 cm proximal ureteral stone at the L3 vertical level. Nonobstructive right and possibly left nephrolithiasis. 3. Other imaging findings of potential clinical significance: Coronary atherosclerosis with mild cardiomegaly. Calcifications of the mitral and aortic valve. 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 Stable hypodense left hepatic lobe lesions, technically too small to characterize. Prominent stool throughout the colon favors constipation. Scattered descending and sigmoid colon diverticula. Brachytherapy seed implants in the prostate gland. Small direct left inguinal hernia contains adipose tissue. Degenerative arthropathy of both hips. Lumbar spondylosis and degenerative disc disease. 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 4. Aortic atherosclerosis. Aortic Atherosclerosis (ICD10-I70.0). Electronically Signed   By: WVan ClinesM.D.   On: 11/10/2019 16:46   NM Bone Scan Whole Body  Result Date: 11/10/2019 CLINICAL DATA:  Prostate cancer. Lower leg pain from a tractor injury about 1 week ago. EXAM: NUCLEAR MEDICINE WHOLE BODY BONE SCAN TECHNIQUE: Whole body anterior and posterior images were obtained approximately 3 hours after intravenous injection of radiopharmaceutical. RADIOPHARMACEUTICALS:  21.0 mCi Technetium-919mDP IV COMPARISON:  Multiple exams, including bone scan from 10/14/2018 FINDINGS: Degenerative findings at the right sternoclavicular joint as  well as along both AC joints/glenohumeral joints.  Degenerative findings in the right foot and in the lumbar spine similar prior. Prominence of the right renal collecting system, likely due to the right proximal ureteral stone shown on CT performed same day. New right proximal tibial metaphyseal band of accentuated activity best seen on the frontal projection; given the recent trauma, the possibility of a stress injury or stress fracture is raised. IMPRESSION: 1. New right proximal tibial metaphyseal band of accentuated uptake. Given the history of recent trauma, the possibility of a stress injury or stress fracture is raised, repeat radiography or MRI may be warranted to further characterize and exclude the unlikely possibility that this represents a metastatic lesion. 2. Degenerative findings in the spine, right sternoclavicular joint, shoulders, and right foot. 3. Prominent right renal collecting system due to known hydronephrosis from proximal ureteral calculus (please see CT report from 11/10/2019). Electronically Signed   By: Van Clines M.D.   On: 11/10/2019 16:50   CT Abdomen Pelvis W Contrast  Result Date: 11/10/2019 CLINICAL DATA:  Prostate cancer with nodal involvement, restaging assessment EXAM: CT CHEST, ABDOMEN, AND PELVIS WITH CONTRAST TECHNIQUE: Multidetector CT imaging of the chest, abdomen and pelvis was performed following the standard protocol during bolus administration of intravenous contrast. CONTRAST:  174m OMNIPAQUE IOHEXOL 300 MG/ML  SOLN COMPARISON:  10/14/2018 FINDINGS: CT CHEST FINDINGS Cardiovascular: Coronary, aortic arch, and branch vessel atherosclerotic vascular disease. Calcifications of the mitral and aortic valve. Stable aortic aneurysm 4.5 cm in diameter, measured on image 30/2. Mild cardiomegaly. Mediastinum/Nodes: Suspected small diverticulum of the distal esophagus along its left side, image 53/2. Otherwise unremarkable. Lungs/Pleura: Stable benign-appearing  minimal subpleural nodularity in the right lung. Stable scarring in the left lower lobe. No new or worrisome nodules. Musculoskeletal: Sclerotic right medial clavicle adjacent to the sternoclavicular joint with asymmetric spurring, probably a manifestation of asymmetric degenerative sternoclavicular arthropathy. Stable 0.9 cm sclerotic lesion in the T10 vertebral body on image 124/5. Tiny sclerotic lesion along the posteroinferior corner of T4 is likewise stable and nonspecific. No significant new bony lesion is observed. CT ABDOMEN PELVIS FINDINGS Hepatobiliary: Stable hypodense left hepatic lobe lesions are technically too small to characterize. No new or enlarging lesions identified. Gallbladder unremarkable. Continued upper normal size of the extrahepatic biliary tree. Slightly nodular hepatic contour could reflect early cirrhosis. Pancreas: Unremarkable Spleen: Unremarkable Adrenals/Urinary Tract: Both adrenal glands appear normal. There is right hydronephrosis and right proximal hydroureter extending down to a 0.8 by 0.8 by 0.6 cm proximal ureteral stone at about the L3 vertebral level. The ureter distal to this stone is of normal caliber. Left renal cysts are again noted. Nonobstructive 10 mm right kidney lower pole calculus on image 78/4. Vascular calcification versus 2 mm nonobstructive renal calculus in the left kidney lower pole on image 84/4. Stomach/Bowel: Scattered descending and sigmoid colon diverticula. Prominent stool throughout the colon favors constipation. Vascular/Lymphatic: Aortoiliac atherosclerotic vascular disease. Currently no pathologic adenopathy is identified. Reproductive: Brachytherapy seed implants in the prostate gland. Other: No supplemental non-categorized findings. Musculoskeletal: Lumbar spondylosis and degenerative disc disease. Degenerative arthropathy of both hips. No compelling findings of osseous metastatic disease in the region. Small direct left inguinal hernia contains  adipose tissue. IMPRESSION: 1. No findings of active malignancy. Stable small sclerotic lesions at T4 and T10, nonspecific. 2. Right hydronephrosis and right proximal hydroureter extending down to a 0.8 by 0.8 by 0.6 cm proximal ureteral stone at the L3 vertical level. Nonobstructive right and possibly left nephrolithiasis. 3. Other imaging findings of potential clinical significance: Coronary  atherosclerosis with mild cardiomegaly. Calcifications of the mitral and aortic valve. 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 Stable hypodense left hepatic lobe lesions, technically too small to characterize. Prominent stool throughout the colon favors constipation. Scattered descending and sigmoid colon diverticula. Brachytherapy seed implants in the prostate gland. Small direct left inguinal hernia contains adipose tissue. Degenerative arthropathy of both hips. Lumbar spondylosis and degenerative disc disease. 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 4. Aortic atherosclerosis. Aortic Atherosclerosis (ICD10-I70.0). Electronically Signed   By: Van Clines M.D.   On: 11/10/2019 16:46     ASSESSMENT:  1.  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 -Abiraterone and prednisone from 07/13/2016 through 04/09/2019, discontinued secondary to PSA progression. -Enzalutamide started on 04/12/2019. -Last Lupron on 06/09/2019. -CT CAP on 11/10/2019 shows no findings of active malignancy.  Stable small sclerotic lesions at T4 and T10, nonspecific.  Right hydronephrosis with right proximal hydroureter extending down to a 0.8 x 0.8 x 0.4 cm proximal ureteral stone at L3 vertical level.  Nonobstructive right and possibly left nephrolithiasis. -Bone scan on 11/10/2019 shows right proximal tibial metaphyseal activity from recent trauma.  Degenerative findings in the spine, right sternoclavicular  joint, shoulders and right foot.  No evidence of metastatic disease. -Foundation 1 test shows MS-stable.  AR amplification.  Sensitivity is reduced due to sample quality.  2.  Androgen deprivation therapy induced bone loss: -Received Zometa every 3 months for a year completed on 10/29/2017.  3.  Genetic testing: -Germline mutation testing was negative.    PLAN:  1.  Metastatic prostate cancer to the left supraclavicular and mediastinal lymph nodes: -I have reviewed images and results of the bone scan and CT scan. -His latest PSA on 11/03/2019 has gone up to 2.32.  This was 1.94 on 08/30/2019.  1.38 on 07/06/2019. -I have recommended that he stop taking enzalutamide once he finishes the current bottle. -He does not have any detectable metastatic disease on the scans. -I have also reviewed results of foundation 1 which is limited by sample quality.  Hence I have recommended sending guardant 360 testing. -I will look into to see PSMA PET scan is available in our area.  That will open PSMA based therapy options. -I will see him back in 2 months for follow-up.  Repeat PSA next visit.  2.  Androgen deprivation therapy induced bone loss: -Continue calcium and vitamin D supplements.  Plan to repeat bone density in the future.  3.  Leg swelling: -Continue Bumex 1 mg daily.  4.  Peripheral neuropathy: -His feet feel cold all the time.  No medical intervention necessary.   Orders placed this encounter:  No orders of the defined types were placed in this encounter.    Derek Jack, MD [place] [phone]   I, Milinda Antis, am acting as a scribe for Dr. Sanda Linger.  I, Derek Jack MD, have reviewed the above documentation for accuracy and completeness, and I agree with the above.

## 2019-11-30 NOTE — Patient Instructions (Signed)
[place] at [facility] Discharge Instructions  You were seen today by Dr. Delton Coombes. He went over your recent results and scans. Stop taking Xtandi after finishing your last bottle. Dr. Delton Coombes will see you back in 2 months for labs and follow up.   Thank you for choosing [place] at [facility] to provide your oncology and hematology care.  To afford each patient quality time with our provider, please arrive at least 15 minutes before your scheduled appointment time.   If you have a lab appointment with the [facility] please come in thru the Main Entrance and check in at the main information desk  You need to re-schedule your appointment should you arrive 10 or more minutes late.  We strive to give you quality time with our providers, and arriving late affects you and other patients whose appointments are after yours.  Also, if you no show three or more times for appointments you may be dismissed from the clinic at the providers discretion.     Again, thank you for choosing [facility].  Our hope is that these requests will decrease the amount of time that you wait before being seen by our physicians.       _____________________________________________________________  Should you have questions after your visit to [facility], please contact our office at [phone] between the hours of 8:00 a.m. and 4:30 p.m.  Voicemails left after 4:00 p.m. will not be returned until the following business day.  For prescription refill requests, have your pharmacy contact our office and allow 72 hours.    Cancer Center Support Programs:   > Cancer Support Group  2nd Tuesday of the month 1pm-2pm, Journey Room

## 2019-11-30 NOTE — Progress Notes (Signed)
Guardant360 labs drawn today from right hand with 19G butterfly needle. Patient tolerated well. Patient discharged ambulatory with instructions to follow up.

## 2020-02-06 ENCOUNTER — Other Ambulatory Visit: Payer: Self-pay

## 2020-02-06 ENCOUNTER — Inpatient Hospital Stay (HOSPITAL_COMMUNITY): Payer: Medicare PPO | Attending: Hematology | Admitting: Hematology

## 2020-02-06 ENCOUNTER — Inpatient Hospital Stay (HOSPITAL_COMMUNITY): Payer: Medicare PPO

## 2020-02-06 VITALS — BP 148/86 | HR 72 | Temp 96.8°F | Resp 18 | Wt 230.3 lb

## 2020-02-06 DIAGNOSIS — Z87891 Personal history of nicotine dependence: Secondary | ICD-10-CM | POA: Diagnosis not present

## 2020-02-06 DIAGNOSIS — C61 Malignant neoplasm of prostate: Secondary | ICD-10-CM

## 2020-02-06 DIAGNOSIS — Z7982 Long term (current) use of aspirin: Secondary | ICD-10-CM | POA: Diagnosis not present

## 2020-02-06 DIAGNOSIS — J449 Chronic obstructive pulmonary disease, unspecified: Secondary | ICD-10-CM | POA: Insufficient documentation

## 2020-02-06 DIAGNOSIS — G629 Polyneuropathy, unspecified: Secondary | ICD-10-CM | POA: Diagnosis not present

## 2020-02-06 DIAGNOSIS — Z8042 Family history of malignant neoplasm of prostate: Secondary | ICD-10-CM | POA: Diagnosis not present

## 2020-02-06 DIAGNOSIS — Z8041 Family history of malignant neoplasm of ovary: Secondary | ICD-10-CM | POA: Diagnosis not present

## 2020-02-06 DIAGNOSIS — C771 Secondary and unspecified malignant neoplasm of intrathoracic lymph nodes: Secondary | ICD-10-CM

## 2020-02-06 DIAGNOSIS — Z9221 Personal history of antineoplastic chemotherapy: Secondary | ICD-10-CM | POA: Diagnosis not present

## 2020-02-06 DIAGNOSIS — I10 Essential (primary) hypertension: Secondary | ICD-10-CM | POA: Insufficient documentation

## 2020-02-06 DIAGNOSIS — Z7984 Long term (current) use of oral hypoglycemic drugs: Secondary | ICD-10-CM | POA: Insufficient documentation

## 2020-02-06 DIAGNOSIS — Z79899 Other long term (current) drug therapy: Secondary | ICD-10-CM | POA: Insufficient documentation

## 2020-02-06 DIAGNOSIS — M7989 Other specified soft tissue disorders: Secondary | ICD-10-CM | POA: Insufficient documentation

## 2020-02-06 DIAGNOSIS — Z8249 Family history of ischemic heart disease and other diseases of the circulatory system: Secondary | ICD-10-CM | POA: Insufficient documentation

## 2020-02-06 DIAGNOSIS — E119 Type 2 diabetes mellitus without complications: Secondary | ICD-10-CM | POA: Diagnosis not present

## 2020-02-06 LAB — CBC WITH DIFFERENTIAL/PLATELET
Abs Immature Granulocytes: 0.04 10*3/uL (ref 0.00–0.07)
Basophils Absolute: 0.1 10*3/uL (ref 0.0–0.1)
Basophils Relative: 1 %
Eosinophils Absolute: 0.3 10*3/uL (ref 0.0–0.5)
Eosinophils Relative: 3 %
HCT: 37.6 % — ABNORMAL LOW (ref 39.0–52.0)
Hemoglobin: 11.7 g/dL — ABNORMAL LOW (ref 13.0–17.0)
Immature Granulocytes: 0 %
Lymphocytes Relative: 14 %
Lymphs Abs: 1.3 10*3/uL (ref 0.7–4.0)
MCH: 27.3 pg (ref 26.0–34.0)
MCHC: 31.1 g/dL (ref 30.0–36.0)
MCV: 87.9 fL (ref 80.0–100.0)
Monocytes Absolute: 0.9 10*3/uL (ref 0.1–1.0)
Monocytes Relative: 10 %
Neutro Abs: 6.5 10*3/uL (ref 1.7–7.7)
Neutrophils Relative %: 72 %
Platelets: 371 10*3/uL (ref 150–400)
RBC: 4.28 MIL/uL (ref 4.22–5.81)
RDW: 13.9 % (ref 11.5–15.5)
WBC: 9.2 10*3/uL (ref 4.0–10.5)
nRBC: 0 % (ref 0.0–0.2)

## 2020-02-06 LAB — COMPREHENSIVE METABOLIC PANEL
ALT: 9 U/L (ref 0–44)
AST: 16 U/L (ref 15–41)
Albumin: 3.7 g/dL (ref 3.5–5.0)
Alkaline Phosphatase: 56 U/L (ref 38–126)
Anion gap: 10 (ref 5–15)
BUN: 15 mg/dL (ref 8–23)
CO2: 27 mmol/L (ref 22–32)
Calcium: 9.9 mg/dL (ref 8.9–10.3)
Chloride: 105 mmol/L (ref 98–111)
Creatinine, Ser: 0.96 mg/dL (ref 0.61–1.24)
GFR calc Af Amer: >60 mL/min (ref >60)
GFR calc non Af Amer: >60 mL/min (ref >60)
Glucose, Bld: 108 mg/dL — ABNORMAL HIGH (ref 70–99)
Potassium: 4.1 mmol/L (ref 3.5–5.1)
Sodium: 142 mmol/L (ref 135–145)
Total Bilirubin: 0.8 mg/dL (ref 0.3–1.2)
Total Protein: 6.7 g/dL (ref 6.5–8.1)

## 2020-02-06 LAB — MAGNESIUM: Magnesium: 2 mg/dL (ref 1.7–2.4)

## 2020-02-06 LAB — PSA: Prostatic Specific Antigen: 3.27 ng/mL (ref 0.00–4.00)

## 2020-02-06 MED ORDER — LEUPROLIDE ACETATE (6 MONTH) 45 MG ~~LOC~~ KIT
45.0000 mg | PACK | Freq: Once | SUBCUTANEOUS | Status: AC
  Administered 2020-02-06: 45 mg via SUBCUTANEOUS
  Filled 2020-02-06: qty 45

## 2020-02-06 MED ORDER — SODIUM CHLORIDE 0.9% FLUSH
10.0000 mL | Freq: Once | INTRAVENOUS | Status: AC
  Administered 2020-02-06: 10 mL via INTRAVENOUS

## 2020-02-06 MED ORDER — HEPARIN SOD (PORK) LOCK FLUSH 100 UNIT/ML IV SOLN
500.0000 [IU] | Freq: Once | INTRAVENOUS | Status: AC
  Administered 2020-02-06: 500 [IU] via INTRAVENOUS

## 2020-02-06 NOTE — Progress Notes (Signed)
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 Docetaxel x 14 cycles from 08/11/2015 through 06/12/2016 2. Abiraterone from 07/13/2016 through 04/09/2019  NGS Results: Foundation 1 MS--stable, [employer] 4 Muts/Mb  CURRENT THERAPY: Observation  BRIEF ONCOLOGIC HISTORY:  Oncology History  Prostate cancer metastatic to intrathoracic lymph node ([place])  07/24/2015 Initial Diagnosis   Prostate cancer metastatic to the left supraclavicular and anterior mediastinal lymph nodes   08/21/2015 - 06/12/2016 Chemotherapy   Docetaxel every 3 weeks for 14 cycles, discontinued as PSA has plateaued around 11    07/13/2016 Treatment Plan Change   Zytiga 1000 milligrams daily along with prednisone 5 mg daily, started secondary to fluid overload from docetaxel   10/14/2018 Genetic Testing   Negative genetic testing.  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  The Common Hereditary Gene Panel offered by [facility] includes sequencing and/or deletion duplication testing of the following 48 genes: APC, ATM, AXIN2, BARD1, BMPR1A, BRCA1, BRCA2, BRIP1, CDH1, CDK4, CDKN2A (p14ARF), CDKN2A (p16INK4a), CHEK2, CTNNA1, DICER1, EPCAM (Deletion/duplication testing only), GREM1 (promoter region deletion/duplication testing only), KIT, MEN1, MLH1, MSH2, MSH3, MSH6, MUTYH, NBN, NF1, NHTL1, PALB2, PDGFRA, PMS2, POLD1, POLE, PTEN, RAD50, RAD51C, RAD51D, RNF43, SDHB, SDHC, SDHD, SMAD4, SMARCA4. STK11, TP53, TSC1, TSC2, and VHL.  The following genes were evaluated for sequence changes only: SDHA and HOXB13 c.251G>A variant only. The report date is Oct 14, 2018.   09/18/2019 Genetic Testing   Foundation One CDx      12/12/2019 Genetic Testing   Guardant 360          CANCER STAGING: Cancer Staging No matching staging information was found for the patient.  INTERVAL HISTORY:  Andre [name], a 79 y.o. male, returns for routine follow-up of his metastatic prostate cancer. Andre [name] was last seen on 11/30/2019.  Today he is accompanied by his daughter. He reports feeling well, especially since the lithotripsy on 8/25. He continues experiencing coolness in his feet. He denies having any new aches or pains. His appetite is good and he denies having any abdominal pain. He has finished taking his Xtandi.   REVIEW OF SYSTEMS:  Review of Systems  Constitutional: Positive for fatigue (mild). Negative for appetite change.  Respiratory: Positive for cough (d/t COPD).   Gastrointestinal: Negative for abdominal pain.  Musculoskeletal: Negative for arthralgias and myalgias.  All other systems reviewed and are negative.   PAST MEDICAL/SURGICAL HISTORY:  Past Medical History:  Diagnosis Date  . Cancer [facility])    prostate  . COPD (chronic obstructive pulmonary disease) ([place])   . Diabetes mellitus without complication ([place])   . Family history of ovarian cancer   . Family history of prostate cancer   . Hypertension    Past Surgical History:  Procedure Laterality Date  . GOLD SEED IMPLANT      SOCIAL HISTORY:  Social History   Socioeconomic History  . Marital status: Widowed    Spouse name: Not on file  . Number of children: Not on file  . Years of education: Not on file  . Highest education level: Not on file  Occupational History  . Not on file  Tobacco Use  . Smoking status: Former Smoker    Types: Cigarettes    Quit date:  06/25/2014    Years since quitting: 5.6  . Smokeless tobacco: Never Used  . Tobacco comment: smoked since young age, quit in 2016  Substance and Sexual Activity  . Alcohol use: Not Currently  . Drug use: No  . Sexual activity: Not on file  Other Topics Concern  . Not on file  Social History Narrative  . Not on file    Social Determinants of Health   Financial Resource Strain:   . Difficulty of Paying Living Expenses: Not on file  Food Insecurity:   . Worried About Charity fundraiser in the Last Year: Not on file  . Ran Out of Food in the Last Year: Not on file  Transportation Needs:   . Lack of Transportation (Medical): Not on file  . Lack of Transportation (Non-Medical): Not on file  Physical Activity:   . Days of Exercise per Week: Not on file  . Minutes of Exercise per Session: Not on file  Stress:   . Feeling of Stress : Not on file  Social Connections:   . Frequency of Communication with Friends and Family: Not on file  . Frequency of Social Gatherings with Friends and Family: Not on file  . Attends Religious Services: Not on file  . Active Member of Clubs or Organizations: Not on file  . Attends Archivist Meetings: Not on file  . Marital Status: Not on file  Intimate Partner Violence:   . Fear of Current or Ex-Partner: Not on file  . Emotionally Abused: Not on file  . Physically Abused: Not on file  . Sexually Abused: Not on file    FAMILY HISTORY:  Family History  Problem Relation Age of Onset  . Ovarian cancer Mother 28  . Heart attack Father   . Prostate cancer Maternal Uncle 18  . Cancer Cousin        pat cousin with unknown cancer    CURRENT MEDICATIONS:  Current Outpatient Medications  Medication Sig Dispense Refill  . ACCU-CHEK GUIDE test strip     . Accu-Chek Softclix Lancets lancets     . acetaminophen (TYLENOL) 500 MG tablet Take 500 mg by mouth every 6 (six) hours as needed for moderate pain.     Marland Kitchen amLODipine (NORVASC) 2.5 MG tablet Take 1 tablet (2.5 mg total) by mouth daily as needed. 30 tablet 0  . aspirin EC 81 MG tablet Take 81 mg by mouth daily.    Marland Kitchen atorvastatin (LIPITOR) 10 MG tablet Take 10 mg by mouth at bedtime.    . Blood Glucose Monitoring Suppl (ACCU-CHEK GUIDE) w/Device KIT     . bumetanide (BUMEX) 1 MG tablet Take 1 tablet (1 mg  total) by mouth daily. 30 tablet 6  . CALCIUM PO Take 600 mg by mouth daily.     . cetirizine (ZYRTEC) 10 MG tablet Take 10 mg by mouth daily.    Marland Kitchen CHERATUSSIN AC 100-10 MG/5ML syrup Take 5 mLs by mouth daily as needed.   0  . Cholecalciferol (VITAMIN D-3 PO) Take 1 tablet by mouth daily.    . cyclobenzaprine (FLEXERIL) 10 MG tablet Take 10 mg by mouth as needed.   0  . dicyclomine (BENTYL) 10 MG capsule Take 10 mg by mouth daily.    . dorzolamide (TRUSOPT) 2 % ophthalmic solution Place 1 drop into both eyes 3 (three) times daily.    . ferrous sulfate 325 (65 FE) MG tablet Take 325 mg by mouth daily with breakfast.    .  fluticasone (FLONASE) 50 MCG/ACT nasal spray USE 2 SPRAY(S) IN EACH NOSTRIL ONCE DAILY FOR 30 DAYS  12  . ibuprofen (ADVIL,MOTRIN) 600 MG tablet Take 600 mg by mouth every 6 (six) hours as needed.   0  . Latanoprostene Bunod (VYZULTA) 0.024 % SOLN Apply 1 drop to eye 1 day or 1 dose.    . losartan (COZAAR) 100 MG tablet Take 100 mg by mouth daily.    . magnesium oxide (MAG-OX) 400 MG tablet Take 1 tablet (400 mg total) by mouth every other day. 90 tablet 2  . metFORMIN (GLUCOPHAGE) 500 MG tablet Take 1,000 mg by mouth 2 (two) times daily with a meal.   3  . potassium chloride (MICRO-K) 10 MEQ CR capsule Take 40 mEq by mouth daily.     . prochlorperazine (COMPAZINE) 10 MG tablet Take 10 mg by mouth every 6 (six) hours as needed for nausea or vomiting.     . Tiotropium Bromide Monohydrate (SPIRIVA RESPIMAT) 1.25 MCG/ACT AERS Take 1.25 mcg by mouth as needed (Daily as needed). 4 g 4  . TRELEGY ELLIPTA 100-62.5-25 MCG/INH AEPB INHALE 1 PUFF ONCE DAILY    . vitamin C (ASCORBIC ACID) 500 MG tablet Take 500 mg by mouth daily.     No current facility-administered medications for this visit.    ALLERGIES:  Allergies  Allergen Reactions  . Metoprolol     PHYSICAL EXAM:  Performance status (ECOG): 1 - Symptomatic but completely ambulatory  Vitals:   02/06/20 1457  BP: (!)  148/86  Pulse: 72  Resp: 18  Temp: (!) 96.8 F (36 C)  SpO2: 94%   Wt Readings from Last 3 Encounters:  02/06/20 230 lb 4.8 oz (104.5 kg)  11/30/19 225 lb 6.4 oz (102.2 kg)  09/06/19 227 lb (103 kg)   Physical Exam Vitals reviewed.  Constitutional:      Appearance: Normal appearance. He is obese.  Cardiovascular:     Rate and Rhythm: Normal rate and regular rhythm.     Pulses: Normal pulses.     Heart sounds: Normal heart sounds.  Pulmonary:     Effort: Pulmonary effort is normal.     Breath sounds: Normal breath sounds.  Musculoskeletal:     Right lower leg: No edema.     Left lower leg: No edema.  Lymphadenopathy:     Cervical: No cervical adenopathy.     Upper Body:     Right upper body: No supraclavicular, axillary or pectoral adenopathy.     Left upper body: No supraclavicular, axillary or pectoral adenopathy.  Neurological:     General: No focal deficit present.     Mental Status: He is alert and oriented to person, place, and time.  Psychiatric:        Mood and Affect: Mood normal.        Behavior: Behavior normal.      LABORATORY DATA:  I have reviewed the labs as listed.  CBC Latest Ref Rng & Units 02/06/2020 11/03/2019 08/30/2019  WBC 4.0 - 10.5 K/uL 9.2 7.8 11.1(H)  Hemoglobin 13.0 - 17.0 g/dL 11.7(L) 11.3(L) 12.1(L)  Hematocrit 39 - 52 % 37.6(L) 36.7(L) 38.2(L)  Platelets 150 - 400 K/uL 371 321 373   CMP Latest Ref Rng & Units 02/06/2020 11/03/2019 08/30/2019  Glucose 70 - 99 mg/dL 108(H) 120(H) 90  BUN 8 - 23 mg/dL 15 15 14   Creatinine 0.61 - 1.24 mg/dL 0.96 0.93 0.93  Sodium 135 - 145 mmol/L 142 139 140  Potassium 3.5 - 5.1 mmol/L 4.1 4.1 4.1  Chloride 98 - 111 mmol/L 105 99 102  CO2 22 - 32 mmol/L 27 27 26   Calcium 8.9 - 10.3 mg/dL 9.9 9.9 9.8  Total Protein 6.5 - 8.1 g/dL 6.7 6.7 6.9  Total Bilirubin 0.3 - 1.2 mg/dL 0.8 0.6 0.7  Alkaline Phos 38 - 126 U/L 56 49 45  AST 15 - 41 U/L 16 16 16   ALT 0 - 44 U/L 9 8 9    PSA 2.32 11/03/2019  PSA 1.94  08/30/2019  PSA 1.38 07/06/2019    DIAGNOSTIC IMAGING:  I have independently reviewed the scans and discussed with the patient. No results found.   ASSESSMENT:  1. 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 -Abiraterone and prednisone from 07/13/2016 through 04/09/2019, discontinued secondary to PSA progression. -Enzalutamide from 04/12/2019 through 11/03/2019 with progression. -Last Lupron on 06/09/2019. -CT CAP on 11/10/2019 shows no findings of active malignancy.  Stable small sclerotic lesions at T4 and T10, nonspecific.  Right hydronephrosis with right proximal hydroureter extending down to a 0.8 x 0.8 x 0.4 cm proximal ureteral stone at L3 vertical level.  Nonobstructive right and possibly left nephrolithiasis. -Bone scan on 11/10/2019 shows right proximal tibial metaphyseal activity from recent trauma.  Degenerative findings in the spine, right sternoclavicular joint, shoulders and right foot.  No evidence of metastatic disease. -Foundation 1 test shows MS-stable.  AR amplification.  Sensitivity is reduced due to sample quality. -Guardant 360 on 12/12/2019 MSI high not detected.  TMB was not evaluable.  No other targetable mutations.  2.  Androgen deprivation therapy induced bone loss: -Received Zometa every 3 months for a year completed on 10/29/2017.  3.  Genetic testing: -Germline mutation testing was negative.   PLAN:  1. Metastatic prostate cancer to the left supraclavicular and mediastinal lymph nodes: -He denies any new pains.  His last PSA was 2.32 in June. -We missed to give his Lupron injection in July.  He will receive injection today. -His labs including LFTs are within normal limits.  We will follow up on his PSA level today.  PSA might be slightly high because of delaying Lupron. -As he did not have any identifiable disease on  traditional scans done in June, he is not receiving any active treatment at this time.  He has discontinued enzalutamide at last visit. -We will follow up on his PSA and give a follow-up visit based on that.  Will consider PSMA PET CT scan which could be available in this area in November.  This might open up PSMA based treatment options.  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  Plan to repeat bone density in the future.  3. Leg swelling: -Continue Bumex 1 mg daily.  4. Peripheral neuropathy: -His feet feel cold all the time.  No medical intervention necessary.   Orders placed this encounter:  No orders of the defined types were placed in this encounter.    Derek Jack, MD [place] [phone]   I, Milinda Antis, am acting as a scribe for Dr. Sanda Linger.  I, Derek Jack MD, have reviewed the above documentation for accuracy and completeness, and I agree with the above.

## 2020-02-06 NOTE — Patient Instructions (Signed)
[place] at [facility] Discharge Instructions  You were seen today by Dr. Delton Coombes. He went over your recent results. You received your injection today; continue receiving it every 6 months. You may purchase compression socks/stockings and wear them as needed. Dr. Delton Coombes will call you back to let you know when your next visit is for labs and follow up.   Thank you for choosing [place] at [facility] to provide your oncology and hematology care.  To afford each patient quality time with our provider, please arrive at least 15 minutes before your scheduled appointment time.   If you have a lab appointment with the [place] please come in thru the Main Entrance and check in at the main information desk  You need to re-schedule your appointment should you arrive 10 or more minutes late.  We strive to give you quality time with our providers, and arriving late affects you and other patients whose appointments are after yours.  Also, if you no show three or more times for appointments you may be dismissed from the clinic at the providers discretion.     Again, thank you for choosing [facility].  Our hope is that these requests will decrease the amount of time that you wait before being seen by our physicians.       _____________________________________________________________  Should you have questions after your visit to [facility], please contact our office at [phone] between the hours of 8:00 a.m. and 4:30 p.m.  Voicemails left after 4:00 p.m. will not be returned until the following business day.  For prescription refill requests, have your pharmacy contact our office and allow 72 hours.    Cancer Center Support Programs:   > Cancer Support Group  2nd Tuesday of the month 1pm-2pm, Journey Room

## 2020-02-06 NOTE — Progress Notes (Signed)
Patient was assessed by Dr. Katragadda and labs have been reviewed.  Patient is okay to proceed with injection today. Primary RN and pharmacy aware.   

## 2020-02-06 NOTE — Progress Notes (Signed)
Patients port flushed without difficulty.  Good blood return noted with no bruising or swelling noted at site.  Band aid applied.  Patient tolerated injection with no complaints voiced.  Site clean and dry with no bruising or swelling noted at site.  Band aid applied.  Vss with discharge and left in satisfactory condition with no s/s of distress noted.

## 2020-02-07 ENCOUNTER — Other Ambulatory Visit (HOSPITAL_COMMUNITY): Payer: Self-pay

## 2020-02-07 DIAGNOSIS — C61 Malignant neoplasm of prostate: Secondary | ICD-10-CM

## 2020-02-07 NOTE — Progress Notes (Signed)
Pylarify PET scan ordered per Dr. Delton Coombes.

## 2020-02-08 ENCOUNTER — Other Ambulatory Visit (HOSPITAL_COMMUNITY): Payer: Self-pay

## 2020-02-08 DIAGNOSIS — C771 Secondary and unspecified malignant neoplasm of intrathoracic lymph nodes: Secondary | ICD-10-CM

## 2020-02-08 DIAGNOSIS — C61 Malignant neoplasm of prostate: Secondary | ICD-10-CM

## 2020-04-25 ENCOUNTER — Inpatient Hospital Stay (HOSPITAL_COMMUNITY): Payer: Medicare PPO | Attending: Hematology

## 2020-04-25 ENCOUNTER — Other Ambulatory Visit: Payer: Self-pay

## 2020-04-25 ENCOUNTER — Inpatient Hospital Stay (HOSPITAL_COMMUNITY): Payer: Medicare PPO | Admitting: Hematology

## 2020-04-25 VITALS — BP 144/60 | HR 83 | Temp 99.3°F | Resp 17 | Wt 227.3 lb

## 2020-04-25 DIAGNOSIS — Z7982 Long term (current) use of aspirin: Secondary | ICD-10-CM | POA: Insufficient documentation

## 2020-04-25 DIAGNOSIS — M7989 Other specified soft tissue disorders: Secondary | ICD-10-CM | POA: Diagnosis not present

## 2020-04-25 DIAGNOSIS — Z87891 Personal history of nicotine dependence: Secondary | ICD-10-CM | POA: Insufficient documentation

## 2020-04-25 DIAGNOSIS — Z8249 Family history of ischemic heart disease and other diseases of the circulatory system: Secondary | ICD-10-CM | POA: Diagnosis not present

## 2020-04-25 DIAGNOSIS — Z8041 Family history of malignant neoplasm of ovary: Secondary | ICD-10-CM | POA: Insufficient documentation

## 2020-04-25 DIAGNOSIS — E119 Type 2 diabetes mellitus without complications: Secondary | ICD-10-CM | POA: Insufficient documentation

## 2020-04-25 DIAGNOSIS — I10 Essential (primary) hypertension: Secondary | ICD-10-CM | POA: Diagnosis not present

## 2020-04-25 DIAGNOSIS — C61 Malignant neoplasm of prostate: Secondary | ICD-10-CM

## 2020-04-25 DIAGNOSIS — Z452 Encounter for adjustment and management of vascular access device: Secondary | ICD-10-CM | POA: Diagnosis not present

## 2020-04-25 DIAGNOSIS — J449 Chronic obstructive pulmonary disease, unspecified: Secondary | ICD-10-CM | POA: Diagnosis not present

## 2020-04-25 DIAGNOSIS — Z79899 Other long term (current) drug therapy: Secondary | ICD-10-CM | POA: Diagnosis not present

## 2020-04-25 DIAGNOSIS — C771 Secondary and unspecified malignant neoplasm of intrathoracic lymph nodes: Secondary | ICD-10-CM | POA: Diagnosis not present

## 2020-04-25 DIAGNOSIS — Z8042 Family history of malignant neoplasm of prostate: Secondary | ICD-10-CM | POA: Insufficient documentation

## 2020-04-25 DIAGNOSIS — Z9221 Personal history of antineoplastic chemotherapy: Secondary | ICD-10-CM | POA: Diagnosis not present

## 2020-04-25 DIAGNOSIS — G629 Polyneuropathy, unspecified: Secondary | ICD-10-CM | POA: Diagnosis not present

## 2020-04-25 LAB — CBC WITH DIFFERENTIAL/PLATELET
Abs Immature Granulocytes: 0.03 10*3/uL (ref 0.00–0.07)
Basophils Absolute: 0.1 10*3/uL (ref 0.0–0.1)
Basophils Relative: 1 %
Eosinophils Absolute: 0.3 10*3/uL (ref 0.0–0.5)
Eosinophils Relative: 3 %
HCT: 37.5 % — ABNORMAL LOW (ref 39.0–52.0)
Hemoglobin: 11.5 g/dL — ABNORMAL LOW (ref 13.0–17.0)
Immature Granulocytes: 0 %
Lymphocytes Relative: 12 %
Lymphs Abs: 1.3 10*3/uL (ref 0.7–4.0)
MCH: 27.4 pg (ref 26.0–34.0)
MCHC: 30.7 g/dL (ref 30.0–36.0)
MCV: 89.3 fL (ref 80.0–100.0)
Monocytes Absolute: 1.1 10*3/uL — ABNORMAL HIGH (ref 0.1–1.0)
Monocytes Relative: 10 %
Neutro Abs: 8 10*3/uL — ABNORMAL HIGH (ref 1.7–7.7)
Neutrophils Relative %: 74 %
Platelets: 330 10*3/uL (ref 150–400)
RBC: 4.2 MIL/uL — ABNORMAL LOW (ref 4.22–5.81)
RDW: 14.2 % (ref 11.5–15.5)
WBC: 10.9 10*3/uL — ABNORMAL HIGH (ref 4.0–10.5)
nRBC: 0 % (ref 0.0–0.2)

## 2020-04-25 LAB — COMPREHENSIVE METABOLIC PANEL
ALT: 10 U/L (ref 0–44)
AST: 16 U/L (ref 15–41)
Albumin: 3.6 g/dL (ref 3.5–5.0)
Alkaline Phosphatase: 56 U/L (ref 38–126)
Anion gap: 8 (ref 5–15)
BUN: 14 mg/dL (ref 8–23)
CO2: 28 mmol/L (ref 22–32)
Calcium: 9.7 mg/dL (ref 8.9–10.3)
Chloride: 104 mmol/L (ref 98–111)
Creatinine, Ser: 1 mg/dL (ref 0.61–1.24)
GFR, Estimated: >60 mL/min (ref >60)
Glucose, Bld: 120 mg/dL — ABNORMAL HIGH (ref 70–99)
Potassium: 4.4 mmol/L (ref 3.5–5.1)
Sodium: 140 mmol/L (ref 135–145)
Total Bilirubin: 0.6 mg/dL (ref 0.3–1.2)
Total Protein: 6.7 g/dL (ref 6.5–8.1)

## 2020-04-25 LAB — PSA: Prostatic Specific Antigen: 7.73 ng/mL — ABNORMAL HIGH (ref 0.00–4.00)

## 2020-04-25 MED ORDER — HEPARIN SOD (PORK) LOCK FLUSH 100 UNIT/ML IV SOLN
500.0000 [IU] | Freq: Once | INTRAVENOUS | Status: AC
  Administered 2020-04-25: 500 [IU] via INTRAVENOUS

## 2020-04-25 MED ORDER — SODIUM CHLORIDE 0.9% FLUSH
10.0000 mL | Freq: Once | INTRAVENOUS | Status: AC
  Administered 2020-04-25: 10 mL

## 2020-04-25 NOTE — Patient Instructions (Addendum)
[place] at [facility] Discharge Instructions  You were seen today by Dr. Delton Coombes. He went over your recent results. You will be scheduled for a specialized PET scan in [place] to track your prostate cancer. Dr. Delton Coombes will see you back in 5-6 weeks for labs and follow up.   Thank you for choosing [place] at [facility] to provide your oncology and hematology care.  To afford each patient quality time with our provider, please arrive at least 15 minutes before your scheduled appointment time.   If you have a lab appointment with the [place] please come in thru the Main Entrance and check in at the main information desk  You need to re-schedule your appointment should you arrive 10 or more minutes late.  We strive to give you quality time with our providers, and arriving late affects you and other patients whose appointments are after yours.  Also, if you no show three or more times for appointments you may be dismissed from the clinic at the providers discretion.     Again, thank you for choosing [facility].  Our hope is that these requests will decrease the amount of time that you wait before being seen by our physicians.       _____________________________________________________________  Should you have questions after your visit to [facility], please contact our office at [phone] between the hours of 8:00 a.m. and 4:30 p.m.  Voicemails left after 4:00 p.m. will not be returned until the following business day.  For prescription refill requests, have your pharmacy contact our office and allow 72 hours.    Cancer Center Support Programs:   > Cancer Support Group  2nd Tuesday of the month 1pm-2pm, Journey Room

## 2020-04-25 NOTE — Progress Notes (Signed)
Patient presented to office today for office visit.  Port accessed for flush only.  Port gave blood return and flushed with no difficulties.  Patient tolerated well.  Discharged ambulatory and in stable condition with family member.

## 2020-04-25 NOTE — Progress Notes (Signed)
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 Docetaxel x 14 cycles from 08/11/2015 through 06/12/2016. 2. Abiraterone from 07/13/2016 through 04/09/2019. 3. Enzalutamide from 04/12/2019 to 11/03/2019.  NGS Results: Foundation 1 MS--stable, TMB 4 Muts/Mb  CURRENT THERAPY: Observation  BRIEF ONCOLOGIC HISTORY:  Oncology History  Prostate cancer metastatic to intrathoracic lymph node ([place])  07/24/2015 Initial Diagnosis   Prostate cancer metastatic to the left supraclavicular and anterior mediastinal lymph nodes   08/21/2015 - 06/12/2016 Chemotherapy   Docetaxel every 3 weeks for 14 cycles, discontinued as PSA has plateaued around 11    07/13/2016 Treatment Plan Change   Zytiga 1000 milligrams daily along with prednisone 5 mg daily, started secondary to fluid overload from docetaxel   10/14/2018 Genetic Testing   Negative genetic testing.  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  The Common Hereditary Gene Panel offered by [facility] includes sequencing and/or deletion duplication testing of the following 48 genes: APC, ATM, AXIN2, BARD1, BMPR1A, BRCA1, BRCA2, BRIP1, CDH1, CDK4, CDKN2A (p14ARF), CDKN2A (p16INK4a), CHEK2, CTNNA1, DICER1, EPCAM (Deletion/duplication testing only), GREM1 (promoter region deletion/duplication testing only), KIT, MEN1, MLH1, MSH2, MSH3, MSH6, MUTYH, NBN, NF1, NHTL1, PALB2, PDGFRA, PMS2, POLD1, POLE, PTEN, RAD50, RAD51C, RAD51D, RNF43, SDHB, SDHC, SDHD, SMAD4, SMARCA4. STK11, TP53, TSC1, TSC2, and VHL.  The following genes were evaluated for sequence changes only: SDHA and HOXB13 c.251G>A variant only. The report date is Oct 14, 2018.   09/18/2019 Genetic Testing   Foundation One CDx      12/12/2019  Genetic Testing   Guardant 360         CANCER STAGING: Cancer Staging No matching staging information was found for the patient.  INTERVAL HISTORY:  [name], a 79 y.o. 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 Andre [name] was last seen on 02/06/2020.   Today he is accompanied by his daughter and he reports feeling well. He went to urgent care for hematuria on 10/23 and was treated with Cipro; it has since resolved.  He spends most of the day watching TV, but he has no issues ambulating and is able to do his ADL's and chores.   REVIEW OF SYSTEMS:  Review of Systems  Constitutional: Positive for fatigue. Negative for appetite change.  Respiratory: Positive for shortness of breath (w/ exertion).   All other systems reviewed and are negative.   PAST MEDICAL/SURGICAL HISTORY:  Past Medical History:  Diagnosis Date  . Cancer [facility])    prostate  . COPD (chronic obstructive pulmonary disease) ([place])   . Diabetes mellitus without complication ([place])   . Family history of ovarian cancer   . Family history of prostate cancer   . Hypertension    Past Surgical History:  Procedure Laterality Date  . GOLD SEED IMPLANT      SOCIAL HISTORY:  Social History   Socioeconomic History  . Marital status: Widowed    Spouse name: Not on file  . Number of children: Not on file  . Years of education: Not on file  . Highest education level: Not on file  Occupational History  . Not on file  Tobacco Use  . Smoking status: Former Smoker    Types: Cigarettes  Quit date: 06/25/2014    Years since quitting: 5.8  . Smokeless tobacco: Never Used  . Tobacco comment: smoked since young age, quit in 2016  Substance and Sexual Activity  . Alcohol use: Not Currently  . Drug use: No  . Sexual activity: Not on file  Other Topics Concern  . Not on file  Social History Narrative  . Not on file   Social Determinants of Health    Financial Resource Strain:   . Difficulty of Paying Living Expenses: Not on file  Food Insecurity:   . Worried About Charity fundraiser in the Last Year: Not on file  . Ran Out of Food in the Last Year: Not on file  Transportation Needs:   . Lack of Transportation (Medical): Not on file  . Lack of Transportation (Non-Medical): Not on file  Physical Activity:   . Days of Exercise per Week: Not on file  . Minutes of Exercise per Session: Not on file  Stress:   . Feeling of Stress : Not on file  Social Connections:   . Frequency of Communication with Friends and Family: Not on file  . Frequency of Social Gatherings with Friends and Family: Not on file  . Attends Religious Services: Not on file  . Active Member of Clubs or Organizations: Not on file  . Attends Archivist Meetings: Not on file  . Marital Status: Not on file  Intimate Partner Violence:   . Fear of Current or Ex-Partner: Not on file  . Emotionally Abused: Not on file  . Physically Abused: Not on file  . Sexually Abused: Not on file    FAMILY HISTORY:  Family History  Problem Relation Age of Onset  . Ovarian cancer Mother 49  . Heart attack Father   . Prostate cancer Maternal Uncle 82  . Cancer Cousin        pat cousin with unknown cancer    CURRENT MEDICATIONS:  Current Outpatient Medications  Medication Sig Dispense Refill  . ACCU-CHEK GUIDE test strip     . Accu-Chek Softclix Lancets lancets     . acetaminophen (TYLENOL) 500 MG tablet Take 500 mg by mouth every 6 (six) hours as needed for moderate pain.     Marland Kitchen amLODipine (NORVASC) 2.5 MG tablet Take 1 tablet (2.5 mg total) by mouth daily as needed. 30 tablet 0  . aspirin EC 81 MG tablet Take 81 mg by mouth daily.    Marland Kitchen atorvastatin (LIPITOR) 10 MG tablet Take 10 mg by mouth at bedtime.    . Blood Glucose Monitoring Suppl (ACCU-CHEK GUIDE) w/Device KIT     . bumetanide (BUMEX) 1 MG tablet Take 1 tablet (1 mg total) by mouth daily. 30 tablet 6  .  CALCIUM PO Take 600 mg by mouth daily.     . cetirizine (ZYRTEC) 10 MG tablet Take 10 mg by mouth daily.    Marland Kitchen CHERATUSSIN AC 100-10 MG/5ML syrup Take 5 mLs by mouth daily as needed.   0  . Cholecalciferol (VITAMIN D-3 PO) Take 1 tablet by mouth daily.    . cyclobenzaprine (FLEXERIL) 10 MG tablet Take 10 mg by mouth as needed.   0  . dicyclomine (BENTYL) 10 MG capsule Take 10 mg by mouth daily.    . dorzolamide (TRUSOPT) 2 % ophthalmic solution Place 1 drop into both eyes 3 (three) times daily.    . ferrous sulfate 325 (65 FE) MG tablet Take 325 mg by mouth daily with  breakfast.    . fluticasone (FLONASE) 50 MCG/ACT nasal spray USE 2 SPRAY(S) IN EACH NOSTRIL ONCE DAILY FOR 30 DAYS  12  . ibuprofen (ADVIL,MOTRIN) 600 MG tablet Take 600 mg by mouth every 6 (six) hours as needed.   0  . Latanoprostene Bunod (VYZULTA) 0.024 % SOLN Apply 1 drop to eye 1 day or 1 dose.    . losartan (COZAAR) 100 MG tablet Take 100 mg by mouth daily.    . magnesium oxide (MAG-OX) 400 MG tablet Take 1 tablet (400 mg total) by mouth every other day. 90 tablet 2  . metFORMIN (GLUCOPHAGE) 500 MG tablet Take 1,000 mg by mouth 2 (two) times daily with a meal.   3  . potassium chloride (MICRO-K) 10 MEQ CR capsule Take 40 mEq by mouth daily.     . prochlorperazine (COMPAZINE) 10 MG tablet Take 10 mg by mouth every 6 (six) hours as needed for nausea or vomiting.     . Tiotropium Bromide Monohydrate (SPIRIVA RESPIMAT) 1.25 MCG/ACT AERS Take 1.25 mcg by mouth as needed (Daily as needed). 4 g 4  . TRELEGY ELLIPTA 100-62.5-25 MCG/INH AEPB INHALE 1 PUFF ONCE DAILY    . vitamin C (ASCORBIC ACID) 500 MG tablet Take 500 mg by mouth daily.     Current Facility-Administered Medications  Medication Dose Route Frequency Provider Last Rate Last Admin  . heparin lock flush 100 unit/mL  500 Units Intravenous Once Derek Jack, MD      . sodium chloride flush (NS) 0.9 % injection 10 mL  10 mL Intracatheter Once Derek Jack,  MD        ALLERGIES:  Allergies  Allergen Reactions  . Metoprolol     PHYSICAL EXAM:  Performance status (ECOG): 1 - Symptomatic but completely ambulatory  Vitals:   04/25/20 1515  BP: (!) 144/60  Pulse: 83  Resp: 17  Temp: 99.3 F (37.4 C)  SpO2: 97%   Wt Readings from Last 3 Encounters:  04/25/20 227 lb 4.8 oz (103.1 kg)  02/06/20 230 lb 4.8 oz (104.5 kg)  11/30/19 225 lb 6.4 oz (102.2 kg)   Physical Exam Vitals reviewed.  Constitutional:      Appearance: Normal appearance. He is obese.  Cardiovascular:     Rate and Rhythm: Normal rate and regular rhythm.     Pulses: Normal pulses.     Heart sounds: Normal heart sounds.  Pulmonary:     Effort: Pulmonary effort is normal.     Breath sounds: Normal breath sounds.  Musculoskeletal:     Right lower leg: No edema.     Left lower leg: No edema.  Lymphadenopathy:     Cervical: No cervical adenopathy.     Upper Body:     Right upper body: No supraclavicular adenopathy.     Left upper body: No supraclavicular adenopathy.  Neurological:     General: No focal deficit present.     Mental Status: He is alert and oriented to person, place, and time.  Psychiatric:        Mood and Affect: Mood normal.        Behavior: Behavior normal.      LABORATORY DATA:  I have reviewed the labs as listed.  CBC Latest Ref Rng & Units 04/25/2020 02/06/2020 11/03/2019  WBC 4.0 - 10.5 K/uL 10.9(H) 9.2 7.8  Hemoglobin 13.0 - 17.0 g/dL 11.5(L) 11.7(L) 11.3(L)  Hematocrit 39 - 52 % 37.5(L) 37.6(L) 36.7(L)  Platelets 150 - 400 K/uL 330 371  321   CMP Latest Ref Rng & Units 04/25/2020 02/06/2020 11/03/2019  Glucose 70 - 99 mg/dL 120(H) 108(H) 120(H)  BUN 8 - 23 mg/dL _0 Creatinine 0.61 - 1.24 mg/dL 1.00 0.96 0.93  Sodium 135 - 145 mmol/L 140 142 139  Potassium 3.5 - 5.1 mmol/L 4.4 4.1 4.1  Chloride 98 - 111 mmol/L 104 105 99  CO2 22 - 32 mmol/L _1 Calcium 8.9 - 10.3 mg/dL 9.7 9.9 9.9  Total Protein 6.5 - 8.1 g/dL 6.7 6.7 6.7   Total Bilirubin 0.3 - 1.2 mg/dL 0.6 0.8 0.6  Alkaline Phos 38 - 126 U/L 56 56 49  AST 15 - 41 U/L _2 ALT 0 - 44 U/L _3 DIAGNOSTIC IMAGING:  I have independently reviewed the scans and discussed with the patient. No results found.   ASSESSMENT:  1. 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 -Abiraterone and prednisone from 07/13/2016 through 04/09/2019, discontinued secondary to PSA progression. -Enzalutamide from 04/12/2019 through 11/03/2019 with progression. -Last Lupron on 06/09/2019. -CT CAP on 11/10/2019 shows no findings of active malignancy. Stable small sclerotic lesions at T4 and T10, nonspecific. Right hydronephrosis with right proximal hydroureter extending down to a 0.8 x 0.8 x 0.4 cm proximal ureteral stone at L3 vertical level. Nonobstructive right and possibly left nephrolithiasis. -Bone scan on 11/10/2019 shows right proximal tibial metaphyseal activity from recent trauma. Degenerative findings in the spine, right sternoclavicular joint, shoulders and right foot. No evidence of metastatic disease. -Foundation 1 test shows MS-stable. AR amplification. Sensitivity is reduced due to sample quality. -Guardant 360 on 12/12/2019 MSI high not detected.  TMB was not evaluable.  No other targetable mutations.  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  3. Genetic testing: -Germline mutation testing was negative.   PLAN:  1. Metastatic prostate cancer to the left supraclavicular and mediastinal lymph nodes: -He is functioning well.  No new onset pains.  Last PSA was 3.27 on 02/06/2020. -Last imaging on 11/10/2019 was negative for metastatic disease.  He had node only positive disease in the past. -I have recommended PSMA based PET imaging to  open up various treatment options. -We'll follow up on PSA from today.  We'll see him back after the PET scan.  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  Repeat bone density in the future.  3. Leg swelling: -Continue Bumex 1 mg daily.  4. Peripheral neuropathy: -His feet feel cold all the time.  No neuropathic pains.   Orders placed this encounter:  Orders Placed This Encounter  Procedures  . NM PET ([id number]) SKULL TO MID THIGH     Derek Jack, MD [place] [phone]   I, Milinda Antis, am acting as a scribe for Dr. Sanda Linger.  I, Derek Jack MD, have reviewed the above documentation for accuracy and completeness, and I agree with the above.

## 2020-05-15 ENCOUNTER — Ambulatory Visit (HOSPITAL_COMMUNITY)
Admission: RE | Admit: 2020-05-15 | Discharge: 2020-05-15 | Disposition: A | Discharge status: Home / Self Care | Payer: Medicare PPO | Source: Ambulatory Visit | Attending: Hematology | Admitting: Hematology

## 2020-05-15 ENCOUNTER — Other Ambulatory Visit: Payer: Self-pay

## 2020-05-15 DIAGNOSIS — C771 Secondary and unspecified malignant neoplasm of intrathoracic lymph nodes: Secondary | ICD-10-CM | POA: Diagnosis present

## 2020-05-15 DIAGNOSIS — C61 Malignant neoplasm of prostate: Secondary | ICD-10-CM | POA: Diagnosis not present

## 2020-05-15 MED ORDER — PIFLIFOLASTAT F 18 (PYLARIFY) INJECTION
9.0000 | Freq: Once | INTRAVENOUS | Status: AC
  Administered 2020-05-15: 9 via INTRAVENOUS

## 2020-05-15 MED ORDER — FLUDEOXYGLUCOSE F - 18 (FDG) INJECTION: 9.5000 | Freq: Once | INTRAVENOUS | Status: DC | PRN

## 2020-05-20 NOTE — Progress Notes (Signed)
° °[facility] °[address] 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 Docetaxel x 14 cycles from 08/11/2015 through 06/12/2016. °2. Abiraterone from 07/13/2016 through 04/09/2019. °3. Enzalutamide from 04/12/2019 to 11/03/2019. 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  VUS identified in PMS2 called [name].  The Common Hereditary Gene Panel offered by [facility] includes sequencing and/or deletion duplication testing of the following 48 genes: APC, ATM, AXIN2, BARD1, BMPR1A, BRCA1, BRCA2, BRIP1, CDH1, CDK4, CDKN2A (p14ARF), CDKN2A (p16INK4a), CHEK2, CTNNA1, DICER1, EPCAM (Deletion/duplication testing only), GREM1 (promoter region deletion/duplication testing only), KIT, MEN1, MLH1, MSH2, MSH3, MSH6, MUTYH, NBN, NF1, NHTL1, PALB2, PDGFRA, PMS2, POLD1, POLE, PTEN, RAD50, RAD51C, RAD51D, RNF43, SDHB, SDHC, SDHD, SMAD4, SMARCA4. STK11, TP53, TSC1, TSC2, and VHL.  The following genes were evaluated for sequence changes only: SDHA and HOXB13 c.251G>A variant only. The report date is Oct 14, 2018. °  °09/18/2019 Genetic Testing  ° Foundation One CDx  ° ° °  °12/12/2019  Genetic Testing  ° Guardant 360 ° ° ° ° °  ° ° °CANCER STAGING: °Cancer Staging °No matching staging information was found for the patient. ° °INTERVAL HISTORY:  °Andre [name], a 79 y.o. 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 [name] was last seen on 04/25/2020.  ° °Today he reports ° °REVIEW OF SYSTEMS:  °Review of Systems - Oncology ° °PAST MEDICAL/SURGICAL HISTORY:  °Past Medical History:  °Diagnosis Date  °• Cancer (HCC)   ° prostate  °• COPD (chronic obstructive pulmonary disease) (HCC)   °• Diabetes mellitus without complication (HCC)   °• Family history of ovarian cancer   °• Family history of prostate cancer   °• Hypertension   ° °Past Surgical History:  °Procedure Laterality Date  °• GOLD SEED IMPLANT    ° ° °SOCIAL HISTORY:  °Social History  ° °Socioeconomic History  °• Marital status: Widowed  °  Spouse name: Not on file  °• Number of children: Not on file  °• Years of education: Not on file  °• Highest education level: Not on file  °Occupational History  °• Not on file  °Tobacco Use  °• Smoking status: Former Smoker  °  Types: Cigarettes  °  Quit date: 06/25/2014  °  Years since quitting: 5.9  °• Smokeless tobacco: Never Used  °• Tobacco comment: smoked since young age, quit in 2016  °Substance and Sexual Activity  °• Alcohol use: Not Currently  °• Drug use: No  °• Sexual activity: Not on file  °Other Topics Concern  °• Not on file  °Social History Narrative  °• Not on file  ° °Social Determinants of Health  ° °  Financial Resource Strain: Not on file  Food Insecurity: Not on file  Transportation Needs: Not on file  Physical Activity: Not on file  Stress: Not on file  Social Connections: Not on file  Intimate Partner Violence: Not on file    FAMILY HISTORY:  Family History  Problem Relation Age of Onset   Ovarian cancer Mother 30   Heart attack Father    Prostate cancer Maternal Uncle 79   Cancer Cousin        pat cousin with  unknown cancer    CURRENT MEDICATIONS:  Current Outpatient Medications  Medication Sig Dispense Refill   ACCU-CHEK GUIDE test strip      Accu-Chek Softclix Lancets lancets      acetaminophen (TYLENOL) 500 MG tablet Take 500 mg by mouth every 6 (six) hours as needed for moderate pain.      amLODipine (NORVASC) 2.5 MG tablet Take 1 tablet (2.5 mg total) by mouth daily as needed. 30 tablet 0   aspirin EC 81 MG tablet Take 81 mg by mouth daily.     atorvastatin (LIPITOR) 10 MG tablet Take 10 mg by mouth at bedtime.     Blood Glucose Monitoring Suppl (ACCU-CHEK GUIDE) w/Device KIT      bumetanide (BUMEX) 1 MG tablet Take 1 tablet (1 mg total) by mouth daily. 30 tablet 6   CALCIUM PO Take 600 mg by mouth daily.      cetirizine (ZYRTEC) 10 MG tablet Take 10 mg by mouth daily.     CHERATUSSIN AC 100-10 MG/5ML syrup Take 5 mLs by mouth daily as needed.   0   Cholecalciferol (VITAMIN D-3 PO) Take 1 tablet by mouth daily.     cyclobenzaprine (FLEXERIL) 10 MG tablet Take 10 mg by mouth as needed.   0   dicyclomine (BENTYL) 10 MG capsule Take 10 mg by mouth daily.     dorzolamide (TRUSOPT) 2 % ophthalmic solution Place 1 drop into both eyes 3 (three) times daily.     ferrous sulfate 325 (65 FE) MG tablet Take 325 mg by mouth daily with breakfast.     fluticasone (FLONASE) 50 MCG/ACT nasal spray USE 2 SPRAY(S) IN EACH NOSTRIL ONCE DAILY FOR 30 DAYS  12   ibuprofen (ADVIL,MOTRIN) 600 MG tablet Take 600 mg by mouth every 6 (six) hours as needed.   0   Latanoprostene Bunod (VYZULTA) 0.024 % SOLN Apply 1 drop to eye 1 day or 1 dose.     losartan (COZAAR) 100 MG tablet Take 100 mg by mouth daily.     magnesium oxide (MAG-OX) 400 MG tablet Take 1 tablet (400 mg total) by mouth every other day. 90 tablet 2   metFORMIN (GLUCOPHAGE) 500 MG tablet Take 1,000 mg by mouth 2 (two) times daily with a meal.   3   potassium chloride (MICRO-K) 10 MEQ CR capsule Take 40 mEq by mouth daily.       prochlorperazine (COMPAZINE) 10 MG tablet Take 10 mg by mouth every 6 (six) hours as needed for nausea or vomiting.      Tiotropium Bromide Monohydrate (SPIRIVA RESPIMAT) 1.25 MCG/ACT AERS Take 1.25 mcg by mouth as needed (Daily as needed). 4 g 4   TRELEGY ELLIPTA 100-62.5-25 MCG/INH AEPB INHALE 1 PUFF ONCE DAILY     vitamin C (ASCORBIC ACID) 500 MG tablet Take 500 mg by mouth daily.     No current facility-administered medications for this visit.    ALLERGIES:  Allergies  Allergen Reactions  Metoprolol   ° ° °PHYSICAL EXAM:  °Performance status (ECOG): 1 - Symptomatic but completely ambulatory ° °There were no vitals filed for this visit. °Wt Readings from Last 3 Encounters:  °04/25/20 227 lb 4.8 oz (103.1 kg)  °02/06/20 230 lb 4.8 oz (104.5 kg)  °11/30/19 225 lb 6.4 oz (102.2 kg)  ° °Physical Exam  ° °LABORATORY DATA:  °I have reviewed the labs as listed.  °CBC Latest Ref Rng & Units 04/25/2020 02/06/2020 11/03/2019  °WBC 4.0 - 10.5 K/uL 10.9(H) 9.2 7.8  °Hemoglobin 13.0 - 17.0 g/dL 11.5(L) 11.7(L) 11.3(L)  °Hematocrit 39.0 - 52.0 % 37.5(L) 37.6(L) 36.7(L)  °Platelets 150 - 400 K/uL 330 371 321  ° °CMP Latest Ref Rng & Units 04/25/2020 02/06/2020 11/03/2019  °Glucose 70 - 99 mg/dL 120(H) 108(H) 120(H)  °BUN 8 - 23 mg/dL 14 15 15  °Creatinine 0.61 - 1.24 mg/dL 1.00 0.96 0.93  °Sodium 135 - 145 mmol/L 140 142 139  °Potassium 3.5 - 5.1 mmol/L 4.4 4.1 4.1  °Chloride 98 - 111 mmol/L 104 105 99  °CO2 22 - 32 mmol/L 28 27 27  °Calcium 8.9 - 10.3 mg/dL 9.7 9.9 9.9  °Total Protein 6.5 - 8.1 g/dL 6.7 6.7 6.7  °Total Bilirubin 0.3 - 1.2 mg/dL 0.6 0.8 0.6  °Alkaline Phos 38 - 126 U/L 56 56 49  °AST 15 - 41 U/L 16 16 16  °ALT 0 - 44 U/L 10 9 8  ° °PSA 7.73 (H) 04/25/2020  °PSA 3.27 02/06/2020  °PSA 2.32 11/03/2019  ° ° °DIAGNOSTIC IMAGING:  °I have independently reviewed the scans and discussed with the patient. °NM PET (F18-PYLARIFY) SKULL TO MID THIGH ° °Result Date: 05/15/2020 °CLINICAL DATA:  Prostate carcinoma  with biochemical recurrence. PSA equal 2.3. Radiation C6 21 EXAM: NUCLEAR MEDICINE PET SKULL BASE TO THIGH TECHNIQUE: 9.5 mCi F18 Piflufolastat (Pylarify) was injected intravenously. Full-ring PET imaging was performed from the skull base to thigh after the radiotracer. CT data was obtained and used for attenuation correction and anatomic localization. COMPARISON:  CT 11/10/2019, CT scan 07/13/2015 FINDINGS: NECK No radiotracer activity in neck lymph nodes. Incidental CT finding: None CHEST Intense radiotracer activity associated with LEFT supraclavicular and prevascular lymph nodes. Example LEFT supraclavicular node measuring 10 mm on image 50 SUV max equal 29.7. Cluster of small prevascular lymph nodes measuring 5-7 mm each with SUV max equal 54 (image 71). AP window node measuring 8 mm on image 74 with SUV max equal 53. Incidental CT finding: No suspicious pulmonary nodules. Coronary artery calcification and aortic atherosclerotic calcification. ABDOMEN/PELVIS Prostate: No focal activity in the prostate gland. Brachytherapy seeds noted. Lymph nodes: No abnormal radiotracer accumulation within pelvic or abdominal nodes. Liver: No evidence of liver metastasis Incidental CT finding: Bilateral nonobstructing renal calculi. Atherosclerotic calcification of the aorta. SKELETON No focal  activity to suggest skeletal metastasis. IMPRESSION: 1. Evidence of prostate cancer metastasis to the mediastinal lymph nodes including prevascular nodes and AP window node. 2. Metastatic prostate carcinoma to LEFT supraclavicular node. 3. Mediastinal and LEFT super clavicular adenopathy pattern matches adenopathy demonstrated on remote scan of 07/13/2015. 4. No evidence of nodal metastasis in the pelvis. 5. No evidence of visceral metastasis or skeletal metastasis Electronically Signed   By: Stewart  Edmunds M.D.   On: 05/15/2020 15:47  °  ° °ASSESSMENT:  °1.  Metastatic prostate cancer to the left supraclavicular and mediastinal lymph  nodes: °-Docetaxel for 14 cycles discontinued as his PSA plateaued around 11 from 08/11/2015 through 06/12/2016. °-Abiraterone   and prednisone from 07/13/2016 through 04/09/2019, discontinued secondary to PSA progression. °-Enzalutamide from 04/12/2019 through 11/03/2019 with progression. °-Last Lupron on 06/09/2019. °-CT CAP on 11/10/2019 shows no findings of active malignancy.  Stable small sclerotic lesions at T4 and T10, nonspecific.  Right hydronephrosis with right proximal hydroureter extending down to a 0.8 x 0.8 x 0.4 cm proximal ureteral stone at L3 vertical level.  Nonobstructive right and possibly left nephrolithiasis. °-Bone scan on 11/10/2019 shows right proximal tibial metaphyseal activity from recent trauma.  Degenerative findings in the spine, right sternoclavicular joint, shoulders and right foot.  No evidence of metastatic disease. °-Foundation 1 test shows MS-stable.  AR amplification.  Sensitivity is reduced due to sample quality. °-Guardant 360 on 12/12/2019 MSI high not detected.  TMB was not evaluable.  No other targetable mutations. °-F-18-PYLARIFY PET scan showed intense radiotracer activity in the left supraclavicular and prevascular lymph nodes, measuring 10 mm.  Cluster of small prevascular lymph nodes measures 5-7 mm each with SUV 54.  AP window lymph node measuring 8 mm.  No activity in the prostate gland or abdominal or pelvic lymph nodes.  No skeletal activity. °  °2.  Androgen deprivation therapy induced bone loss: °-Received Zometa every 3 months for a year completed on 10/29/2017. °  °3.  Genetic testing: °-Germline mutation testing was negative. ° ° °PLAN:  °1.  Metastatic prostate cancer to the left supraclavicular and mediastinal lymph nodes: °-His PSA continues to go up.  Last PSA was 7.7 on 04/25/2020. °-We reviewed images of the PET CT scan from 05/15/2020 which showed mediastinal and left supraclavicular lymph nodes.  No activity in the bones or visceral metastasis. °-He has received  docetaxel in the past.  He also progressed on abiraterone and enzalutamide. °-I will recommend evaluation by Dr. Manning for possible lutetium 177-PSA May-617 treatment. °-Continue Lupron every 6 months. °-We will see him back in 2 months with repeat labs. °  °2.  Androgen deprivation therapy induced bone loss: °-Continue calcium and vitamin D supplements.  Repeat bone density in the future. °  °3.  Leg swelling: °-Continue Bumex 1 mg daily. °  °4.  Peripheral neuropathy: °-No neuropathic pains although his feet feel cold all the time. °  °Orders placed this encounter:  °No orders of the defined types were placed in this encounter. ° ° ° °Sreedhar Katragadda, MD ° [facility] Center °[phone] ° ° °I, Daniel Khashchuk, am acting as a scribe for Dr. Sreedhar Katagadda. ° °I, Sreedhar Katragadda MD, have reviewed the above documentation for accuracy and completeness, and I agree with the above. °  ° ° °

## 2020-05-21 ENCOUNTER — Telehealth: Payer: Self-pay | Admitting: *Deleted

## 2020-05-21 ENCOUNTER — Inpatient Hospital Stay (HOSPITAL_COMMUNITY): Payer: Medicare PPO | Admitting: Hematology

## 2020-05-21 ENCOUNTER — Encounter (HOSPITAL_COMMUNITY): Payer: Self-pay | Admitting: Hematology

## 2020-05-21 ENCOUNTER — Other Ambulatory Visit: Payer: Self-pay

## 2020-05-21 VITALS — BP 160/64 | HR 53 | Temp 97.1°F | Resp 18 | Wt 241.2 lb

## 2020-05-21 DIAGNOSIS — C61 Malignant neoplasm of prostate: Secondary | ICD-10-CM

## 2020-05-21 DIAGNOSIS — Z452 Encounter for adjustment and management of vascular access device: Secondary | ICD-10-CM | POA: Diagnosis not present

## 2020-05-21 DIAGNOSIS — C771 Secondary and unspecified malignant neoplasm of intrathoracic lymph nodes: Secondary | ICD-10-CM | POA: Diagnosis not present

## 2020-05-21 NOTE — Patient Instructions (Signed)
[facility] Discharge Instructions  You were seen today by Dr. Katragadda. Follow up as scheduled.   Thank you for choosing [facility] at [facility] to provide your oncology and hematology care.  To afford each patient quality time with our provider, please arrive at least 15 minutes before your scheduled appointment time.   If you have a lab appointment with the Cancer Center please come in thru the Main Entrance and check in at the main information desk.  You need to re-schedule your appointment should you arrive 10 or more minutes late.  We strive to give you quality time with our providers, and arriving late affects you and other patients whose appointments are after yours.  Also, if you no show three or more times for appointments you may be dismissed from the clinic at the providers discretion.     Again, thank you for choosing  Cancer Center.  Our hope is that these requests will decrease the amount of time that you wait before being seen by our physicians.       _____________________________________________________________  Should you have questions after your visit to  Cancer Center, please contact our office at [phone] and follow the prompts.  Our office hours are 8:00 a.m. and 4:30 p.m. Monday - Friday.  Please note that voicemails left after 4:00 p.m. may not be returned until the following business day.  We are closed weekends and major holidays.  You do have access to a nurse 24-7, just call the main number to the clinic [phone] and do not press any options, hold on the line and a nurse will answer the phone.    For prescription refill requests, have your pharmacy contact our office and allow 72 hours.    Due to Covid, you will need to wear a mask upon entering the hospital. If you do not have a mask, a mask will be given to you at the Main Entrance upon arrival. For doctor visits, patients  may have 1 support person age 18 or older with them. For treatment visits, patients can not have anyone with them due to social distancing guidelines and our immunocompromised population.      

## 2020-05-21 NOTE — Telephone Encounter (Signed)
LVM for call back to schedule appointment with Dr. Manning. 

## 2020-05-22 ENCOUNTER — Ambulatory Visit
Admission: RE | Admit: 2020-05-22 | Discharge: 2020-05-22 | Disposition: A | Discharge status: Home / Self Care | Payer: Medicare PPO | Source: Ambulatory Visit | Attending: Radiation Oncology | Admitting: Radiation Oncology

## 2020-05-22 ENCOUNTER — Other Ambulatory Visit: Payer: Self-pay

## 2020-05-22 ENCOUNTER — Encounter: Payer: Self-pay | Admitting: Radiation Oncology

## 2020-05-22 DIAGNOSIS — Z79899 Other long term (current) drug therapy: Secondary | ICD-10-CM | POA: Insufficient documentation

## 2020-05-22 DIAGNOSIS — Z8042 Family history of malignant neoplasm of prostate: Secondary | ICD-10-CM | POA: Diagnosis not present

## 2020-05-22 DIAGNOSIS — E119 Type 2 diabetes mellitus without complications: Secondary | ICD-10-CM | POA: Insufficient documentation

## 2020-05-22 DIAGNOSIS — C771 Secondary and unspecified malignant neoplasm of intrathoracic lymph nodes: Secondary | ICD-10-CM | POA: Insufficient documentation

## 2020-05-22 DIAGNOSIS — I10 Essential (primary) hypertension: Secondary | ICD-10-CM | POA: Diagnosis not present

## 2020-05-22 DIAGNOSIS — Z8041 Family history of malignant neoplasm of ovary: Secondary | ICD-10-CM | POA: Insufficient documentation

## 2020-05-22 DIAGNOSIS — C61 Malignant neoplasm of prostate: Secondary | ICD-10-CM | POA: Insufficient documentation

## 2020-05-22 DIAGNOSIS — Z87891 Personal history of nicotine dependence: Secondary | ICD-10-CM | POA: Insufficient documentation

## 2020-05-22 DIAGNOSIS — Z192 Hormone resistant malignancy status: Secondary | ICD-10-CM | POA: Insufficient documentation

## 2020-05-22 DIAGNOSIS — J449 Chronic obstructive pulmonary disease, unspecified: Secondary | ICD-10-CM | POA: Insufficient documentation

## 2020-05-22 DIAGNOSIS — Z9221 Personal history of antineoplastic chemotherapy: Secondary | ICD-10-CM | POA: Diagnosis not present

## 2020-05-22 HISTORY — DX: Malignant neoplasm of prostate: C61

## 2020-05-22 NOTE — Progress Notes (Addendum)
Histology and Location of Primary Cancer: metastatic prostate cancer  Location(s) of Symptomatic tumor(s): intrathoracic lymph nodes  PET scan showed intense radiotracer activity in the left supraclavicular and prevascular lymph nodes, measuring 10 mm.  Cluster of small prevascular lymph nodes measures 5-7 mm each with SUV 54.  AP window lymph node measuring 8 mm.    Past/Anticipated chemotherapy by medical oncology, if any:  PRIOR THERAPY:  1. Docetaxel x 14 cycles from 08/11/2015 through 06/12/2016. 2. Abiraterone from 07/13/2016 through 04/09/2019. 3. Enzalutamide from 04/12/2019 to 11/03/2019.  NGS Results: Foundation 1 MS--stable, TMB 4 Muts/Mb  CURRENT THERAPY: Observation  Patient's main complaints related to symptomatic tumor(s) are: none  Pain on a scale of 0-10 is: denies    If Spine Met(s), symptoms, if any, include:  Bowel/Bladder retention or incontinence (please describe): denies  Numbness or weakness in extremities (please describe): denies  Current Decadron regimen, if applicable: denies  Reports an unchanged cough r/t COPD  Ambulatory status? Walker? Wheelchair?: ambulatory  SAFETY ISSUES:  Prior radiation? Yes, under the care of Dr. Goodchild  Pacemaker/ICD? no  Possible current pregnancy? No, male patient  Is the patient on methotrexate? no  Additional Complaints / other details:  79 year old male. Widowed. Former smoker. Mother with hx of ovarian ca. Maternal uncle with hx of prostate ca. Accompanied today by daughter.  

## 2020-05-22 NOTE — Progress Notes (Signed)
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 ([place])  07/24/2015 Initial Diagnosis   Prostate cancer metastatic to the left supraclavicular and anterior mediastinal lymph nodes   08/21/2015 - 06/12/2016 Chemotherapy   Docetaxel every 3 weeks for 14 cycles, discontinued as PSA has plateaued around 11    07/13/2016 Treatment Plan Change   Zytiga 1000 milligrams daily along with prednisone 5 mg daily, started secondary to fluid overload from docetaxel    Here are his recent PSA test results:   He has PSMA PET scan on 05/15/20 showing mediastinal and left supraclavicular adenopathy:      He has been referred today to discuss potential radiation therapy options including external radiation, SBRT and Lu-177 PSMA 177.  PREVIOUS RADIATION THERAPY: Yes previous prostate radiation including LDR brachytherapy without pelvic recurrence.  Past Medical History:  Diagnosis Date  . COPD (chronic obstructive pulmonary disease) ([place])   . Diabetes mellitus without complication ([place])   . Family history of ovarian cancer   . Family history of prostate cancer   . Hypertension   . Prostate cancer [facility])   :  Past Surgical History:  Procedure Laterality Date  . GOLD SEED IMPLANT    :   Current Outpatient Medications:  .  ACCU-CHEK GUIDE test  strip, , Disp: , Rfl:  .  Accu-Chek Softclix Lancets lancets, , Disp: , Rfl:  .  amLODipine (NORVASC) 2.5 MG tablet, Take 1 tablet (2.5 mg total) by mouth daily as needed., Disp: 30 tablet, Rfl: 0 .  aspirin EC 81 MG tablet, Take 81 mg by mouth daily., Disp: , Rfl:  .  atorvastatin (LIPITOR) 10 MG tablet, Take 10 mg by mouth at bedtime., Disp: , Rfl:  .  Blood Glucose Monitoring Suppl (ACCU-CHEK GUIDE) w/Device KIT, , Disp: , Rfl:  .  bumetanide (BUMEX) 1 MG tablet, Take 1 tablet (1 mg total) by mouth daily., Disp: 30 tablet, Rfl: 6 .  CALCIUM PO, Take 600 mg by mouth daily. , Disp: , Rfl:  .  cetirizine (ZYRTEC) 10 MG tablet, Take 10 mg by mouth daily., Disp: , Rfl:  .  CHERATUSSIN AC 100-10 MG/5ML syrup, Take 5 mLs by mouth daily as needed. , Disp: , Rfl: 0 .  Cholecalciferol (VITAMIN D-3 PO), Take 1 tablet by mouth daily., Disp: , Rfl:  .  cyclobenzaprine (FLEXERIL) 10 MG tablet, Take 10 mg by mouth as needed. , Disp: , Rfl: 0 .  dicyclomine (BENTYL) 10 MG capsule, Take 10 mg by mouth daily., Disp: , Rfl:  .  dorzolamide (TRUSOPT) 2 % ophthalmic solution, Place 1 drop into both eyes 3 (three) times daily., Disp: , Rfl:  .  ferrous sulfate 325 (65 FE) MG tablet, Take 325 mg by mouth daily with breakfast., Disp: , Rfl:  .  fluticasone (FLONASE) 50 MCG/ACT nasal spray, USE 2 SPRAY(S) IN EACH NOSTRIL  ONCE DAILY FOR 30 DAYS, Disp: , Rfl: 12 .  ibuprofen (ADVIL,MOTRIN) 600 MG tablet, Take 600 mg by mouth every 6 (six) hours as needed. , Disp: , Rfl: 0 .  Latanoprostene Bunod (VYZULTA) 0.024 % SOLN, Apply 1 drop to eye 1 day or 1 dose., Disp: , Rfl:  .  losartan (COZAAR) 100 MG tablet, Take 100 mg by mouth daily., Disp: , Rfl:  .  magnesium oxide (MAG-OX) 400 MG tablet, Take 1 tablet (400 mg total) by mouth every other day., Disp: 90 tablet, Rfl: 2 .  metFORMIN (GLUCOPHAGE) 500 MG tablet, Take 1,000 mg by mouth 2 (two) times daily with a meal. , Disp: , Rfl: 3 .  potassium chloride (MICRO-K) 10 MEQ  CR capsule, Take 40 mEq by mouth daily. , Disp: , Rfl:  .  prochlorperazine (COMPAZINE) 10 MG tablet, Take 10 mg by mouth every 6 (six) hours as needed for nausea or vomiting. , Disp: , Rfl:  .  Tiotropium Bromide Monohydrate (SPIRIVA RESPIMAT) 1.25 MCG/ACT AERS, Take 1.25 mcg by mouth as needed (Daily as needed)., Disp: 4 g, Rfl: 4 .  TRELEGY ELLIPTA 100-62.5-25 MCG/INH AEPB, INHALE 1 PUFF ONCE DAILY, Disp: , Rfl:  .  vitamin C (ASCORBIC ACID) 500 MG tablet, Take 500 mg by mouth daily., Disp: , Rfl:  .  acetaminophen (TYLENOL) 500 MG tablet, Take 500 mg by mouth every 6 (six) hours as needed for moderate pain.  (Patient not taking: No sig reported), Disp: , Rfl: :  Allergies  Allergen Reactions  . Metoprolol Other (See Comments)    Increases frequency of cough  :  Family History  Problem Relation Age of Onset  . Ovarian cancer Mother 107  . Heart attack Father   . Prostate cancer Maternal Uncle 39  . Cancer Cousin        pat cousin with unknown cancer  . Colon cancer Neg Hx   . Pancreatic cancer Neg Hx   . Breast cancer Neg Hx   :  Social History   Socioeconomic History  . Marital status: Widowed    Spouse name: Not on file  . Number of children: Not on file  . Years of education: Not on file  . Highest education level: Not on file  Occupational History  . Not on file  Tobacco Use  . Smoking status: Former Smoker    Packs/day: 0.25    Years: 60.00    Pack years: 15.00    Types: Cigarettes    Quit date: 06/25/2014    Years since quitting: 5.9  . Smokeless tobacco: Never Used  . Tobacco comment: smoked since young age, quit in 2016  Substance and Sexual Activity  . Alcohol use: Not Currently  . Drug use: No  . Sexual activity: Not Currently  Other Topics Concern  . Not on file  Social History Narrative  . Not on file   Social Determinants of Health   Financial Resource Strain: Low Risk   . Difficulty of Paying Living Expenses: Not hard at all  Food Insecurity: No  Food Insecurity  . Worried About Charity fundraiser in the Last Year: Never true  . Ran Out of Food in the Last Year: Never true  Transportation Needs: No Transportation Needs  . Lack of Transportation (Medical): No  . Lack of Transportation (Non-Medical): No  Physical Activity: Insufficiently Active  . Days of Exercise per Week: 2 days  . Minutes of Exercise per Session: 20 min  Stress: No Stress Concern Present  . Feeling of Stress : Not at all  Social Connections: Moderately Isolated  . Frequency of Communication with Friends and Family: More than three times a week  . Frequency of Social Gatherings with Friends and Family: More than three times a week  . Attends Religious Services: 1 to 4 times per year  . Active Member of Clubs or Organizations: No  . Attends Archivist Meetings: Never  . Marital Status: Widowed  Intimate Partner Violence: Not At Risk  . Fear of Current or Ex-Partner: No  . Emotionally Abused: No  . Physically Abused: No  . Sexually Abused: No  :  REVIEW OF SYSTEMS:  A 15 point review of systems is documented in the electronic medical record. This was obtained by the nursing staff. However, I reviewed this with the patient to discuss relevant findings and make appropriate changes.  Pertinent items noted in HPI and remainder of comprehensive ROS otherwise negative.   PHYSICAL EXAM:  Blood pressure (!) 166/83, pulse 81, temperature 98.7 F (37.1 C), resp. rate 20, height 5' 9"  (1.753 m), weight 239 lb (108.4 kg), SpO2 97 %. Patient comfortable, in good spirits, normal respiration.  Neuro intact.  KPS = 90  100 - Normal; no complaints; no evidence of disease. 90   - Able to carry on normal activity; minor signs or symptoms of disease. 80   - Normal activity with effort; some signs or symptoms of disease. 36   - Cares for self; unable to carry on normal activity or to do active work. 60   - Requires occasional assistance, but is able to care for most  of his personal needs. 50   - Requires considerable assistance and frequent medical care. 21   - Disabled; requires special care and assistance. 46   - Severely disabled; hospital admission is indicated although death not imminent. 67   - Very sick; hospital admission necessary; active supportive treatment necessary. 10   - Moribund; fatal processes progressing rapidly. 0     - Dead  Karnofsky DA, Abelmann [place], Craver LS and Burchenal [facility] [phone]) The use of the nitrogen mustards in the palliative treatment of carcinoma: with particular reference to bronchogenic carcinoma Cancer 1 634-56  LABORATORY DATA:  Lab Results  Component Value Date   WBC 10.9 (H) 04/25/2020   HGB 11.5 (L) 04/25/2020   HCT 37.5 (L) 04/25/2020   MCV 89.3 04/25/2020   PLT 330 04/25/2020   Lab Results  Component Value Date   NA 140 04/25/2020   K 4.4 04/25/2020   CL 104 04/25/2020   CO2 28 04/25/2020   Lab Results  Component Value Date   ALT 10 04/25/2020   AST 16 04/25/2020   ALKPHOS 56 04/25/2020   BILITOT 0.6 04/25/2020     RADIOGRAPHY: NM PET (F18-PYLARIFY) SKULL TO MID THIGH  Result Date: 05/15/2020 CLINICAL DATA:  Prostate carcinoma with biochemical recurrence. PSA equal 2.3. Radiation C6 21 EXAM: NUCLEAR MEDICINE PET SKULL BASE TO THIGH TECHNIQUE: 9.5 mCi F18 Piflufolastat (Pylarify) was injected intravenously. Full-ring PET imaging was performed from the skull base to thigh after the radiotracer. CT data was obtained and used for attenuation correction and anatomic localization. COMPARISON:  CT 11/10/2019, CT scan 07/13/2015 FINDINGS: NECK No radiotracer activity in neck lymph nodes. Incidental CT finding: None CHEST Intense radiotracer activity associated with LEFT supraclavicular and prevascular lymph nodes. Example LEFT supraclavicular node measuring 10 mm on image 50 SUV max equal 29.7.  Cluster of small prevascular lymph nodes measuring 5-7 mm each with SUV max equal 54 (image 71). AP window node  measuring 8 mm on image 74 with SUV max equal 53. Incidental CT finding: No suspicious pulmonary nodules. Coronary artery calcification and aortic atherosclerotic calcification. ABDOMEN/PELVIS Prostate: No focal activity in the prostate gland. Brachytherapy seeds noted. Lymph nodes: No abnormal radiotracer accumulation within pelvic or abdominal nodes. Liver: No evidence of liver metastasis Incidental CT finding: Bilateral nonobstructing renal calculi. Atherosclerotic calcification of the aorta. SKELETON No focal  activity to suggest skeletal metastasis. IMPRESSION: 1. Evidence of prostate cancer metastasis to the mediastinal lymph nodes including prevascular nodes and AP window node. 2. Metastatic prostate carcinoma to LEFT supraclavicular node. 3. Mediastinal and LEFT super clavicular adenopathy pattern matches adenopathy demonstrated on remote scan of 07/13/2015. 4. No evidence of nodal metastasis in the pelvis. 5. No evidence of visceral metastasis or skeletal metastasis Electronically Signed   By: Suzy Bouchard M.D.   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  Given the relatively small disease burden, this could be considered oligometastatic disease.  PLAN:Today, I talked to the patient and family about the findings and work-up thus far.  We discussed the natural history of metastatic prostate cancer and general treatment, highlighting the role of radiotherapy in the management.  We discussed the available radiation techniques, and focused on the details of logistics and delivery.  We reviewed the anticipated acute and late sequelae associated with radiation in this setting.   At this time, the patient is interested in waiting to assess the availability of Lutetium PSMA therapy, pending priority FDA review.  In the interim  he will continue to monitor PSA with Dr. Raliegh Ip.  If his PSA rise becomes worrisome, we may offer SBRT to the supraclavicular and prevascular nodes.  I personally spent 30 minutes in this encounter including chart review, reviewing radiological studies, meeting face-to-face with the patient and completing documentation.   ------------------------------------------------   Tyler Pita, MD [place] Director and Director of Stereotactic Radiosurgery Direct Dial: [phone]  Fax: [phone] [email or web address]  Skype  LinkedIn

## 2020-05-28 ENCOUNTER — Other Ambulatory Visit (HOSPITAL_COMMUNITY): Payer: Self-pay | Admitting: Hematology

## 2020-08-05 ENCOUNTER — Inpatient Hospital Stay (HOSPITAL_COMMUNITY): Payer: Medicare PPO | Attending: Hematology

## 2020-08-05 ENCOUNTER — Other Ambulatory Visit: Payer: Self-pay

## 2020-08-05 ENCOUNTER — Inpatient Hospital Stay (HOSPITAL_COMMUNITY): Payer: Medicare PPO | Admitting: Hematology

## 2020-08-05 ENCOUNTER — Other Ambulatory Visit (HOSPITAL_COMMUNITY): Payer: Self-pay

## 2020-08-05 ENCOUNTER — Inpatient Hospital Stay (HOSPITAL_COMMUNITY): Payer: Medicare PPO

## 2020-08-05 ENCOUNTER — Encounter (HOSPITAL_COMMUNITY): Payer: Self-pay | Admitting: Hematology

## 2020-08-05 VITALS — BP 141/81 | HR 79 | Temp 98.1°F | Resp 18 | Wt 239.9 lb

## 2020-08-05 DIAGNOSIS — C61 Malignant neoplasm of prostate: Secondary | ICD-10-CM

## 2020-08-05 DIAGNOSIS — E119 Type 2 diabetes mellitus without complications: Secondary | ICD-10-CM | POA: Diagnosis not present

## 2020-08-05 DIAGNOSIS — Z8041 Family history of malignant neoplasm of ovary: Secondary | ICD-10-CM | POA: Insufficient documentation

## 2020-08-05 DIAGNOSIS — C771 Secondary and unspecified malignant neoplasm of intrathoracic lymph nodes: Secondary | ICD-10-CM | POA: Diagnosis present

## 2020-08-05 DIAGNOSIS — Z8042 Family history of malignant neoplasm of prostate: Secondary | ICD-10-CM | POA: Diagnosis not present

## 2020-08-05 DIAGNOSIS — Z8249 Family history of ischemic heart disease and other diseases of the circulatory system: Secondary | ICD-10-CM | POA: Diagnosis not present

## 2020-08-05 DIAGNOSIS — I10 Essential (primary) hypertension: Secondary | ICD-10-CM | POA: Insufficient documentation

## 2020-08-05 DIAGNOSIS — Z7984 Long term (current) use of oral hypoglycemic drugs: Secondary | ICD-10-CM | POA: Diagnosis not present

## 2020-08-05 DIAGNOSIS — Z79899 Other long term (current) drug therapy: Secondary | ICD-10-CM | POA: Insufficient documentation

## 2020-08-05 DIAGNOSIS — Z87891 Personal history of nicotine dependence: Secondary | ICD-10-CM | POA: Diagnosis not present

## 2020-08-05 DIAGNOSIS — Z5111 Encounter for antineoplastic chemotherapy: Secondary | ICD-10-CM | POA: Diagnosis not present

## 2020-08-05 DIAGNOSIS — Z7982 Long term (current) use of aspirin: Secondary | ICD-10-CM | POA: Diagnosis not present

## 2020-08-05 LAB — COMPREHENSIVE METABOLIC PANEL WITH GFR
ALT: 12 U/L (ref 0–44)
AST: 16 U/L (ref 15–41)
Albumin: 3.7 g/dL (ref 3.5–5.0)
Alkaline Phosphatase: 62 U/L (ref 38–126)
Anion gap: 8 (ref 5–15)
BUN: 19 mg/dL (ref 8–23)
CO2: 28 mmol/L (ref 22–32)
Calcium: 10 mg/dL (ref 8.9–10.3)
Chloride: 105 mmol/L (ref 98–111)
Creatinine, Ser: 0.97 mg/dL (ref 0.61–1.24)
GFR, Estimated: >60 mL/min
Glucose, Bld: 178 mg/dL — ABNORMAL HIGH (ref 70–99)
Potassium: 4.5 mmol/L (ref 3.5–5.1)
Sodium: 141 mmol/L (ref 135–145)
Total Bilirubin: 0.6 mg/dL (ref 0.3–1.2)
Total Protein: 7.2 g/dL (ref 6.5–8.1)

## 2020-08-05 LAB — CBC WITH DIFFERENTIAL/PLATELET
Abs Immature Granulocytes: 0.03 10*3/uL (ref 0.00–0.07)
Basophils Absolute: 0.1 10*3/uL (ref 0.0–0.1)
Basophils Relative: 1 %
Eosinophils Absolute: 0.3 10*3/uL (ref 0.0–0.5)
Eosinophils Relative: 3 %
HCT: 38.5 % — ABNORMAL LOW (ref 39.0–52.0)
Hemoglobin: 11.9 g/dL — ABNORMAL LOW (ref 13.0–17.0)
Immature Granulocytes: 0 %
Lymphocytes Relative: 12 %
Lymphs Abs: 1.2 10*3/uL (ref 0.7–4.0)
MCH: 27.4 pg (ref 26.0–34.0)
MCHC: 30.9 g/dL (ref 30.0–36.0)
MCV: 88.7 fL (ref 80.0–100.0)
Monocytes Absolute: 1 10*3/uL (ref 0.1–1.0)
Monocytes Relative: 10 %
Neutro Abs: 7.5 10*3/uL (ref 1.7–7.7)
Neutrophils Relative %: 74 %
Platelets: 356 10*3/uL (ref 150–400)
RBC: 4.34 MIL/uL (ref 4.22–5.81)
RDW: 14.2 % (ref 11.5–15.5)
WBC: 10.2 10*3/uL (ref 4.0–10.5)
nRBC: 0 % (ref 0.0–0.2)

## 2020-08-05 LAB — PSA: Prostatic Specific Antigen: 24.05 ng/mL — ABNORMAL HIGH (ref 0.00–4.00)

## 2020-08-05 MED ORDER — LEUPROLIDE ACETATE (6 MONTH) 45 MG ~~LOC~~ KIT
45.0000 mg | PACK | Freq: Once | SUBCUTANEOUS | Status: AC
  Administered 2020-08-05: 45 mg via SUBCUTANEOUS
  Filled 2020-08-05: qty 45

## 2020-08-05 MED ORDER — HEPARIN SOD (PORK) LOCK FLUSH 100 UNIT/ML IV SOLN
500.0000 [IU] | Freq: Once | INTRAVENOUS | Status: AC
  Administered 2020-08-05: 500 [IU] via INTRAVENOUS

## 2020-08-05 MED ORDER — SODIUM CHLORIDE 0.9% FLUSH
10.0000 mL | Freq: Once | INTRAVENOUS | Status: AC
  Administered 2020-08-05: 10 mL via INTRAVENOUS

## 2020-08-05 MED ORDER — BUMETANIDE 1 MG PO TABS: 1.5000 mg | ORAL_TABLET | Freq: Every day | ORAL | 6 refills | Status: DC

## 2020-08-05 NOTE — Patient Instructions (Signed)
[place] at [facility] Discharge Instructions  You were seen today by Dr. Delton Coombes. He went over your recent results and scans. You received your Lupron injection today. Continue taking Bumex 1.5 tablets as needed for leg swelling. Dr. Delton Coombes will see you back depending on your PSA level.   Thank you for choosing [place] at [facility] to provide your oncology and hematology care.  To afford each patient quality time with our provider, please arrive at least 15 minutes before your scheduled appointment time.   If you have a lab appointment with the [facility] please come in thru the Main Entrance and check in at the main information desk  You need to re-schedule your appointment should you arrive 10 or more minutes late.  We strive to give you quality time with our providers, and arriving late affects you and other patients whose appointments are after yours.  Also, if you no show three or more times for appointments you may be dismissed from the clinic at the providers discretion.     Again, thank you for choosing [facility].  Our hope is that these requests will decrease the amount of time that you wait before being seen by our physicians.       _____________________________________________________________  Should you have questions after your visit to [facility], please contact our office at [phone] between the hours of 8:00 a.m. and 4:30 p.m.  Voicemails left after 4:00 p.m. will not be returned until the following business day.  For prescription refill requests, have your pharmacy contact our office and allow 72 hours.    Cancer Center Support Programs:   > Cancer Support Group  2nd Tuesday of the month 1pm-2pm, Journey Room

## 2020-08-05 NOTE — Progress Notes (Signed)
Patient was assessed by Dr. Delton Coombes and labs have been reviewed.  Patient is okay to proceed with Lupron injection today. Primary RN and pharmacy aware.

## 2020-08-05 NOTE — Progress Notes (Signed)
Patient tolerated Lupron injection with no complaints voiced.  Site clean and dry with no bruising or swelling noted at site.  Band aid applied.  VSS with discharge and left ambulatory with no s/s of distress noted.   Patients port flushed without difficulty.  Good blood return noted with no bruising or swelling noted at site.  Band aid applied.  VSS with discharge and left ambulatory with no s/s of distress noted

## 2020-08-05 NOTE — Progress Notes (Addendum)
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 Docetaxel x 14 cycles from 08/11/2015 through 06/12/2016. 2. Abiraterone from 07/13/2016 through 04/09/2019. 3. Enzalutamide from 04/12/2019 to 11/03/2019.  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  The Common Hereditary Gene Panel offered by [facility] includes sequencing and/or deletion duplication testing of the following 48 genes: APC, ATM, AXIN2, BARD1, BMPR1A, BRCA1, BRCA2, BRIP1, CDH1, CDK4, CDKN2A (p14ARF), CDKN2A (p16INK4a), CHEK2, CTNNA1, DICER1, EPCAM (Deletion/duplication testing only), GREM1 (promoter region deletion/duplication testing only), KIT, MEN1, MLH1, MSH2, MSH3, MSH6, MUTYH, NBN, NF1, NHTL1, PALB2, PDGFRA, PMS2, POLD1, POLE, PTEN, RAD50, RAD51C, RAD51D, RNF43, SDHB, SDHC, SDHD, SMAD4, SMARCA4. STK11, TP53, TSC1, TSC2, and VHL.  The following genes were evaluated for sequence changes only: SDHA and HOXB13 c.251G>A variant only. The report date is Oct 14, 2018.   09/18/2019 Genetic Testing   Foundation One CDx       12/12/2019 Genetic Testing   Guardant 360         CANCER STAGING: Cancer Staging No matching staging information was found for the patient.  INTERVAL HISTORY:  [name], a 80 y.o. 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 Andre [name] was last seen on 05/21/2020.   Today he is accompanied by his daughter and he reports feeling well. He denies having any new aches or pains. He has started exercising on a stationary bike and on an elliptical for several minutes a day. He started taking Bumex 1.5 mg for his leg swelling since 03/12 and he continues having intermittent cold sensation in his feet, but denies burning sensation.   REVIEW OF SYSTEMS:  Review of Systems  Constitutional: Positive for fatigue. Negative for appetite change.  Respiratory: Positive for shortness of breath (w/ exertion).   Cardiovascular: Positive for leg swelling (feet swelling; on Bumex).  Neurological: Positive for numbness (fingertips).  All other systems reviewed and are negative.   PAST MEDICAL/SURGICAL HISTORY:  Past Medical History:  Diagnosis Date  . COPD (chronic obstructive pulmonary disease) ([facility])   . Diabetes mellitus without complication ([facility])   . Family history of ovarian cancer   . Family history of prostate cancer   . Hypertension   . Prostate cancer [facility])    Past Surgical History:  Procedure Laterality Date  . GOLD SEED IMPLANT      SOCIAL HISTORY:  Social History   Socioeconomic History  . Marital status: Widowed    Spouse name: Not on file  . Number of children: Not on file  . Years of education: Not on file  . Highest education level: Not on file  Occupational History  . Not on file  Tobacco Use  . Smoking status: Former Smoker    Packs/day: 0.25    Years: 60.00    Pack years: 15.00    Types: Cigarettes    Quit date: 06/25/2014    Years since quitting: 6.1  . Smokeless tobacco: Never Used  . Tobacco comment:  smoked since young age, quit in 2016  Substance and Sexual Activity  . Alcohol use: Not Currently  . Drug use: No  . Sexual activity: Not Currently  Other Topics Concern  . Not on file  Social History Narrative  . Not on file   Social Determinants of Health   Financial Resource Strain: Low Risk   . Difficulty of Paying Living Expenses: Not hard at all  Food Insecurity: No Food Insecurity  . Worried About Charity fundraiser in the Last Year: Never true  . Ran Out of Food in the Last Year: Never true  Transportation Needs: No Transportation Needs  . Lack of Transportation (Medical): No  . Lack of Transportation (Non-Medical): No  Physical Activity: Insufficiently Active  . Days of Exercise per Week: 2 days  . Minutes of Exercise per Session: 20 min  Stress: No Stress Concern Present  . Feeling of Stress : Not at all  Social Connections: Moderately Isolated  . Frequency of Communication with Friends and Family: More than three times a week  . Frequency of Social Gatherings with Friends and Family: More than three times a week  . Attends Religious Services: 1 to 4 times per year  . Active Member of Clubs or Organizations: No  . Attends Archivist Meetings: Never  . Marital Status: Widowed  Intimate Partner Violence: Not At Risk  . Fear of Current or Ex-Partner: No  . Emotionally Abused: No  . Physically Abused: No  . Sexually Abused: No    FAMILY HISTORY:  Family History  Problem Relation Age of Onset  . Ovarian cancer Mother 41  . Heart attack Father   . Prostate cancer Maternal Uncle 28  . Cancer Cousin        pat cousin with unknown cancer  . Colon cancer Neg Hx   . Pancreatic cancer Neg Hx   . Breast cancer Neg Hx     CURRENT MEDICATIONS:  Current Outpatient Medications  Medication Sig Dispense Refill  . ACCU-CHEK GUIDE test strip     . Accu-Chek Softclix Lancets lancets     . acetaminophen (TYLENOL) 500 MG tablet Take 500 mg by mouth every 6 (six)  hours as needed for moderate pain.  (Patient not taking: No sig reported)    . amLODipine (NORVASC) 2.5 MG tablet Take 1 tablet (2.5 mg total) by mouth daily as needed. 30 tablet 0  . aspirin EC 81 MG tablet Take 81 mg by mouth daily.    Marland Kitchen atorvastatin (LIPITOR) 10 MG tablet Take 10 mg by mouth at bedtime.    . Blood Glucose Monitoring Suppl (ACCU-CHEK GUIDE) w/Device KIT     . bumetanide (BUMEX) 1 MG tablet Take 1 tablet (1 mg total) by mouth daily. 30 tablet 6  . CALCIUM PO Take 600 mg by mouth daily.     . cetirizine (ZYRTEC) 10 MG tablet Take 10 mg by mouth daily.    Marland Kitchen CHERATUSSIN AC 100-10 MG/5ML syrup Take 5 mLs by mouth daily as needed.   0  . Cholecalciferol (VITAMIN D-3 PO) Take 1 tablet by mouth daily.    Marland Kitchen  cyclobenzaprine (FLEXERIL) 10 MG tablet Take 10 mg by mouth as needed.   0  . dicyclomine (BENTYL) 10 MG capsule Take 10 mg by mouth daily.    . dorzolamide (TRUSOPT) 2 % ophthalmic solution Place 1 drop into both eyes 3 (three) times daily.    . ferrous sulfate 325 (65 FE) MG tablet Take 325 mg by mouth daily with breakfast.    . fluticasone (FLONASE) 50 MCG/ACT nasal spray USE 2 SPRAY(S) IN EACH NOSTRIL ONCE DAILY FOR 30 DAYS  12  . ibuprofen (ADVIL,MOTRIN) 600 MG tablet Take 600 mg by mouth every 6 (six) hours as needed.   0  . Latanoprostene Bunod (VYZULTA) 0.024 % SOLN Apply 1 drop to eye 1 day or 1 dose.    . losartan (COZAAR) 100 MG tablet Take 100 mg by mouth daily.    . magnesium oxide (MAG-OX) 400 MG tablet Take 1 tablet (400 mg total) by mouth every other day. 90 tablet 2  . MAGNESIUM-OXIDE 400 (241.3 Mg) MG tablet TAKE 1 TABLET BY MOUTH EVERY OTHER DAY 90 tablet 6  . metFORMIN (GLUCOPHAGE) 500 MG tablet Take 1,000 mg by mouth 2 (two) times daily with a meal.   3  . potassium chloride (MICRO-K) 10 MEQ CR capsule Take 40 mEq by mouth daily.     . prochlorperazine (COMPAZINE) 10 MG tablet Take 10 mg by mouth every 6 (six) hours as needed for nausea or vomiting.     .  Tiotropium Bromide Monohydrate (SPIRIVA RESPIMAT) 1.25 MCG/ACT AERS Take 1.25 mcg by mouth as needed (Daily as needed). 4 g 4  . TRELEGY ELLIPTA 100-62.5-25 MCG/INH AEPB INHALE 1 PUFF ONCE DAILY    . vitamin C (ASCORBIC ACID) 500 MG tablet Take 500 mg by mouth daily.     No current facility-administered medications for this visit.    ALLERGIES:  Allergies  Allergen Reactions  . Metoprolol Other (See Comments)    Increases frequency of cough    PHYSICAL EXAM:  Performance status (ECOG): 1 - Symptomatic but completely ambulatory  Vitals:   08/05/20 1517  BP: (!) 141/81  Pulse: 79  Resp: 18  Temp: 98.1 F (36.7 C)  SpO2: 98%   Wt Readings from Last 3 Encounters:  08/05/20 239 lb 13.8 oz (108.8 kg)  05/22/20 239 lb (108.4 kg)  05/21/20 241 lb 3.2 oz (109.4 kg)   Physical Exam Vitals reviewed.  Constitutional:      Appearance: Normal appearance. He is obese.  Cardiovascular:     Rate and Rhythm: Normal rate and regular rhythm.     Pulses: Normal pulses.     Heart sounds: Normal heart sounds.  Pulmonary:     Effort: Pulmonary effort is normal.     Breath sounds: Normal breath sounds.  Chest:  Breasts:     Right: No supraclavicular adenopathy.     Left: Supraclavicular adenopathy (at root of SCM) present.    Lymphadenopathy:     Cervical: No cervical adenopathy.     Upper Body:     Right upper body: No supraclavicular adenopathy.     Left upper body: Supraclavicular adenopathy (at root of SCM) present.  Neurological:     General: No focal deficit present.     Mental Status: He is alert and oriented to person, place, and time.  Psychiatric:        Mood and Affect: Mood normal.        Behavior: Behavior normal.      LABORATORY  DATA:  I have reviewed the labs as listed.  CBC Latest Ref Rng & Units 08/05/2020 04/25/2020 02/06/2020  WBC 4.0 - 10.5 K/uL 10.2 10.9(H) 9.2  Hemoglobin 13.0 - 17.0 g/dL 11.9(L) 11.5(L) 11.7(L)  Hematocrit 39.0 - 52.0 % 38.5(L) 37.5(L)  37.6(L)  Platelets 150 - 400 K/uL 356 330 371   CMP Latest Ref Rng & Units 08/05/2020 04/25/2020 02/06/2020  Glucose 70 - 99 mg/dL 178(H) 120(H) 108(H)  BUN 8 - 23 mg/dL 19 14 15   Creatinine 0.61 - 1.24 mg/dL 0.97 1.00 0.96  Sodium 135 - 145 mmol/L 141 140 142  Potassium 3.5 - 5.1 mmol/L 4.5 4.4 4.1  Chloride 98 - 111 mmol/L 105 104 105  CO2 22 - 32 mmol/L 28 28 27   Calcium 8.9 - 10.3 mg/dL 10.0 9.7 9.9  Total Protein 6.5 - 8.1 g/dL 7.2 6.7 6.7  Total Bilirubin 0.3 - 1.2 mg/dL 0.6 0.6 0.8  Alkaline Phos 38 - 126 U/L 62 56 56  AST 15 - 41 U/L 16 16 16   ALT 0 - 44 U/L 12 10 9    PSA 7.73 (H) 04/25/2020  PSA 3.27 02/06/2020  PSA 2.32 11/03/2019    DIAGNOSTIC IMAGING:  I have independently reviewed the scans and discussed with the patient. No results found.   ASSESSMENT:  1. 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 -Abiraterone and prednisone from 07/13/2016 through 04/09/2019, discontinued secondary to PSA progression. -Enzalutamidefrom 04/12/2019 through 11/03/2019 with progression. -Last Lupron on 06/09/2019. -CT CAP on 11/10/2019 shows no findings of active malignancy. Stable small sclerotic lesions at T4 and T10, nonspecific. Right hydronephrosis with right proximal hydroureter extending down to a 0.8 x 0.8 x 0.4 cm proximal ureteral stone at L3 vertical level. Nonobstructive right and possibly left nephrolithiasis. -Bone scan on 11/10/2019 shows right proximal tibial metaphyseal activity from recent trauma. Degenerative findings in the spine, right sternoclavicular joint, shoulders and right foot. No evidence of metastatic disease. -Foundation 1 test shows MS-stable. AR amplification. Sensitivity is reduced due to sample quality. -Guardant 360 on 12/12/2019 MSI high not detected. TMB was not evaluable. No other targetable  mutations. -F-18-PYLARIFY PET scan showed intense radiotracer activity in the left supraclavicular and prevascular lymph nodes, measuring 10 mm.  Cluster of small prevascular lymph nodes measures 5-7 mm each with SUV 54.  AP window lymph node measuring 8 mm.  No activity in the prostate gland or abdominal or pelvic lymph nodes.  No skeletal activity.  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  3. Genetic testing: -Germline mutation testing was negative.   PLAN:  1. Metastatic prostate cancer to the left supraclavicular and mediastinal lymph nodes: -He does not report any new onset pains. -Reviewed labs from 08/05/2020 which showed normal LFTs and kidney function.  CBC was also grossly normal.  Last PSA was 7.7 on 04/25/2020.  PSA from today is pending. -He met with Dr. Tammi Klippel and lutetium PSMA was not approved yet.  We will follow-up on the PSA today.  If there is significant changes in the PSA, will consider SBRT while we are awaiting approval of lutetium PSMA. -He will receive Lupron today.  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  Consider repeating bone density in the future.  3. Leg swelling: -He has increased Bumex to 1 and  half pill since last Friday.  We will send a new prescription.  4. Peripheral neuropathy: -His feet feel cold intermittently with no neuropathic pains.  No medical intervention necessary.   Orders placed this encounter:  No orders of the defined types were placed in this encounter.    Derek Jack, MD [place] [phone]   I, Milinda Antis, am acting as a scribe for Dr. Sanda Linger.  I, Derek Jack MD, have reviewed the above documentation for accuracy and completeness, and I agree with the above.

## 2020-08-19 ENCOUNTER — Other Ambulatory Visit: Payer: Self-pay

## 2020-08-19 ENCOUNTER — Ambulatory Visit
Admission: RE | Admit: 2020-08-19 | Discharge: 2020-08-19 | Disposition: A | Discharge status: Home / Self Care | Payer: Medicare PPO | Source: Ambulatory Visit | Attending: Radiation Oncology | Admitting: Radiation Oncology

## 2020-08-19 DIAGNOSIS — C61 Malignant neoplasm of prostate: Secondary | ICD-10-CM | POA: Diagnosis present

## 2020-08-19 DIAGNOSIS — C771 Secondary and unspecified malignant neoplasm of intrathoracic lymph nodes: Secondary | ICD-10-CM | POA: Diagnosis present

## 2020-08-19 DIAGNOSIS — Z510 Encounter for antineoplastic radiation therapy: Secondary | ICD-10-CM | POA: Insufficient documentation

## 2020-08-19 NOTE — Progress Notes (Signed)
  Radiation Oncology         (336) [phone] ________________________________  Name: Andre [name] MRN: [id number]  Date: 08/19/2020  DOB: [birth year]  STEREOTACTIC BODY RADIOTHERAPY SIMULATION AND TREATMENT PLANNING NOTE    ICD-10-CM   1. 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  Identity was confirmed.  All relevant records and images related to the planned course of therapy were reviewed.  The patient freely provided informed written consent to proceed with treatment after reviewing the details related to the planned course of therapy. The consent form was witnessed and verified by the simulation staff.  Then, the patient was set-up in a stable reproducible  supine position for radiation therapy.  A BodyFix immobilization pillow was fabricated for reproducible positioning.  Then I personally applied the abdominal compression paddle to limit respiratory excursion.  4D respiratoy motion management CT images were obtained.  Surface markings were placed.  The CT images were loaded into the planning software.  Then, using Cine, MIP, and standard views, the internal target volume (ITV) and planning target volumes (PTV) were delinieated, and avoidance structures were contoured.  Treatment planning then occurred.  The radiation prescription was entered and confirmed.  A total of two complex treatment devices were fabricated in the form of the BodyFix immobilization pillow and a neck accuform cushion.  I have requested : 3D Simulation  I have requested a DVH of the following structures: Heart, Lungs, Esophagus, Chest Wall, Brachial Plexus, Major Blood Vessels, and targets.  SPECIAL TREATMENT  PROCEDURE:  The planned course of therapy using radiation constitutes a special treatment procedure. Special care is required in the management of this patient for the following reasons. This treatment constitutes a Special Treatment Procedure for the following reason: [ High dose per fraction requiring special monitoring for increased toxicities of treatment including daily imaging..  The special nature of the planned course of radiotherapy will require increased physician supervision and oversight to ensure patient's safety with optimal treatment outcomes.  RESPIRATORY MOTION MANAGEMENT SIMULATION:  In order to account for effect of respiratory motion on target structures and other organs in the planning and delivery of radiotherapy, this patient underwent respiratory motion management simulation.  To accomplish this, when the patient was brought to the CT simulation planning suite, 4D respiratoy motion management CT images were obtained.  The CT images were loaded into the planning software.  Then, using a variety of tools including Cine, MIP, and standard views, the target volume and planning target volumes (PTV) were delineated.  Avoidance structures were contoured.  Treatment planning then occurred.  Dose volume histograms were generated and reviewed for each of the requested structure.  The resulting plan was carefully reviewed and approved today.  PLAN:  The patient will receive 40 Gy in 5 fractions.  ________________________________  Sheral Apley Tammi Klippel, M.D.

## 2020-08-27 ENCOUNTER — Ambulatory Visit
Admission: RE | Admit: 2020-08-27 | Discharge: 2020-08-27 | Disposition: A | Discharge status: Home / Self Care | Payer: Medicare PPO | Source: Ambulatory Visit | Attending: Radiation Oncology | Admitting: Radiation Oncology

## 2020-08-27 ENCOUNTER — Other Ambulatory Visit: Payer: Self-pay

## 2020-08-27 DIAGNOSIS — C771 Secondary and unspecified malignant neoplasm of intrathoracic lymph nodes: Secondary | ICD-10-CM | POA: Insufficient documentation

## 2020-08-27 DIAGNOSIS — C61 Malignant neoplasm of prostate: Secondary | ICD-10-CM

## 2020-08-28 ENCOUNTER — Ambulatory Visit: Payer: Medicare PPO | Admitting: Radiation Oncology

## 2020-08-28 ENCOUNTER — Other Ambulatory Visit (HOSPITAL_COMMUNITY): Payer: Self-pay

## 2020-08-28 DIAGNOSIS — C61 Malignant neoplasm of prostate: Secondary | ICD-10-CM

## 2020-08-29 ENCOUNTER — Other Ambulatory Visit: Payer: Self-pay

## 2020-08-29 ENCOUNTER — Ambulatory Visit
Admission: RE | Admit: 2020-08-29 | Discharge: 2020-08-29 | Disposition: A | Discharge status: Home / Self Care | Payer: Medicare PPO | Source: Ambulatory Visit | Attending: Radiation Oncology | Admitting: Radiation Oncology

## 2020-08-29 DIAGNOSIS — C61 Malignant neoplasm of prostate: Secondary | ICD-10-CM

## 2020-08-30 ENCOUNTER — Ambulatory Visit: Payer: Medicare PPO | Admitting: Radiation Oncology

## 2020-09-02 ENCOUNTER — Other Ambulatory Visit: Payer: Self-pay

## 2020-09-02 ENCOUNTER — Ambulatory Visit
Admission: RE | Admit: 2020-09-02 | Discharge: 2020-09-02 | Disposition: A | Discharge status: Home / Self Care | Payer: Medicare PPO | Source: Ambulatory Visit | Attending: Radiation Oncology | Admitting: Radiation Oncology

## 2020-09-02 DIAGNOSIS — C61 Malignant neoplasm of prostate: Secondary | ICD-10-CM

## 2020-09-04 ENCOUNTER — Ambulatory Visit
Admission: RE | Admit: 2020-09-04 | Discharge: 2020-09-04 | Disposition: A | Discharge status: Home / Self Care | Payer: Medicare PPO | Source: Ambulatory Visit | Attending: Radiation Oncology | Admitting: Radiation Oncology

## 2020-09-04 ENCOUNTER — Other Ambulatory Visit: Payer: Self-pay

## 2020-09-04 DIAGNOSIS — C61 Malignant neoplasm of prostate: Secondary | ICD-10-CM | POA: Diagnosis not present

## 2020-09-04 DIAGNOSIS — C771 Secondary and unspecified malignant neoplasm of intrathoracic lymph nodes: Secondary | ICD-10-CM

## 2020-09-06 ENCOUNTER — Other Ambulatory Visit: Payer: Self-pay | Admitting: Radiation Oncology

## 2020-09-06 ENCOUNTER — Encounter: Payer: Self-pay | Admitting: Urology

## 2020-09-06 ENCOUNTER — Ambulatory Visit
Admission: RE | Admit: 2020-09-06 | Discharge: 2020-09-06 | Disposition: A | Discharge status: Home / Self Care | Payer: Medicare PPO | Source: Ambulatory Visit | Attending: Radiation Oncology | Admitting: Radiation Oncology

## 2020-09-06 ENCOUNTER — Other Ambulatory Visit: Payer: Self-pay

## 2020-09-06 DIAGNOSIS — C61 Malignant neoplasm of prostate: Secondary | ICD-10-CM | POA: Diagnosis not present

## 2020-09-06 MED ORDER — SUCRALFATE 1 G PO TABS: 1.0000 g | ORAL_TABLET | Freq: Three times a day (TID) | ORAL | 2 refills | Status: DC

## 2020-10-02 ENCOUNTER — Encounter: Payer: Self-pay | Admitting: Urology

## 2020-10-02 NOTE — Progress Notes (Addendum)
Patient is aware that this is a phone visit . Patient denies any dysuria or hematuria. Patient reports a moderate stream. Patient denies any leakage or urgency. Patient states that he empties his bladder with urination. Patient reports nocturia x 1. Patient states that he see's his urologist on 09/05/2020. Patient denies any straining with urination.Patient states that he has a straight stream .Patient denies any issues with his bowels. Patient IPPS is a  2. Patients states that the medication is correct and that there was no need to go over it.

## 2020-10-09 ENCOUNTER — Other Ambulatory Visit: Payer: Self-pay

## 2020-10-09 ENCOUNTER — Ambulatory Visit
Admission: RE | Admit: 2020-10-09 | Discharge: 2020-10-09 | Disposition: A | Discharge status: Home / Self Care | Payer: Medicare PPO | Source: Ambulatory Visit | Attending: Urology | Admitting: Urology

## 2020-10-09 DIAGNOSIS — C61 Malignant neoplasm of prostate: Secondary | ICD-10-CM

## 2020-10-09 DIAGNOSIS — C771 Secondary and unspecified malignant neoplasm of intrathoracic lymph nodes: Secondary | ICD-10-CM

## 2020-10-11 NOTE — Progress Notes (Signed)
  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  Beams/energy:   The patient was treated using stereotactic body radiotherapy according to a 3D conformal radiotherapy plan.  Volumetric arc fields were employed to deliver 6 MV X-rays.  Image guidance was performed with per fraction cone beam CT prior to treatment under personal MD supervision.  Immobilization was achieved using BodyFix Pillow.  Narrative: The patient tolerated radiation treatment relatively well without any ill side effects.  Plan: The patient has completed radiation treatment. The patient will return to radiation oncology clinic for routine followup in one month. I advised them to call or return sooner if they have any questions or concerns related to their recovery or treatment. ________________________________  Sheral Apley. Tammi Klippel, M.D.

## 2020-10-11 NOTE — Progress Notes (Addendum)
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  Narrative:  I spoke with the patient and his daughter, Andre [name], to conduct his routine scheduled 1 month follow up visit via telephone to spare the patient unnecessary potential exposure in the healthcare setting during the current COVID-19 pandemic.  The patient was notified in advance and gave permission to proceed with this visit format. He tolerated radiation treatment relatively well without any ill side effects.                             On review of systems, the patient states that he is doing very well in general.  He did experience some mild burning with swallowing that did not require any medication and has now resolved.  He is currently without complaints and specifically denies increased shortness of breath, chest pain, hemoptysis, productive cough or dysphagia.  He reports a healthy appetite and is maintaining his weight.  He continues to tolerate the Lupron ADT well with his last injection on 08/05/2020 under the care of Dr. Delton Coombes.  He has scheduled follow-up with his urologist, Dr. Verna Czech as well as Dr. Delton Coombes in medical oncology and is overall, pleased with his progress to date.  ALLERGIES:  is allergic to metoprolol.  Meds: Current Outpatient Medications  Medication Sig Dispense Refill  . ACCU-CHEK GUIDE test strip     . Accu-Chek Softclix Lancets  lancets     . acetaminophen (TYLENOL) 500 MG tablet Take 500 mg by mouth every 6 (six) hours as needed for moderate pain.    Marland Kitchen amLODipine (NORVASC) 2.5 MG tablet Take 1 tablet (2.5 mg total) by mouth daily as needed. 30 tablet 0  . aspirin EC 81 MG tablet Take 81 mg by mouth daily.    Marland Kitchen atorvastatin (LIPITOR) 10 MG tablet Take 10 mg by mouth at bedtime.    . Blood Glucose Monitoring Suppl (ACCU-CHEK GUIDE) w/Device KIT     . bumetanide (BUMEX) 1 MG tablet Take 1.5 tablets (1.5 mg total) by mouth daily. 45 tablet 6  . CALCIUM PO Take 600 mg by mouth daily.     . cetirizine (ZYRTEC) 10 MG tablet Take 10 mg by mouth daily.    Marland Kitchen CHERATUSSIN AC 100-10 MG/5ML syrup Take 5 mLs by mouth daily as needed.   0  . Cholecalciferol (VITAMIN D-3 PO) Take 1 tablet by mouth daily.    . cyclobenzaprine (FLEXERIL) 10 MG tablet Take 10 mg by mouth as needed.   0  . dicyclomine (BENTYL) 10 MG capsule Take 10 mg by mouth daily.    . dorzolamide (TRUSOPT) 2 % ophthalmic solution Place 1 drop into both eyes 3 (three) times daily.    . ferrous sulfate 325 (65 FE) MG tablet Take 325 mg by mouth daily with breakfast.    . fluticasone (FLONASE) 50 MCG/ACT nasal spray USE 2 SPRAY(S)  IN EACH NOSTRIL ONCE DAILY FOR 30 DAYS  12  . ibuprofen (ADVIL,MOTRIN) 600 MG tablet Take 600 mg by mouth every 6 (six) hours as needed.   0  . Latanoprostene Bunod (VYZULTA) 0.024 % SOLN Apply 1 drop to eye 1 day or 1 dose.    . losartan (COZAAR) 100 MG tablet Take 100 mg by mouth daily.    . magnesium oxide (MAG-OX) 400 MG tablet Take 1 tablet (400 mg total) by mouth every other day. 90 tablet 2  . MAGNESIUM-OXIDE 400 (241.3 Mg) MG tablet TAKE 1 TABLET BY MOUTH EVERY OTHER DAY 90 tablet 6  . metFORMIN (GLUCOPHAGE) 500 MG tablet Take 1,000 mg by mouth 2 (two) times daily with a meal.   3  . potassium chloride (MICRO-K) 10 MEQ CR capsule Take 40 mEq by mouth daily.     . prochlorperazine (COMPAZINE) 10 MG tablet Take 10 mg by mouth every 6  (six) hours as needed for nausea or vomiting.     . sucralfate (CARAFATE) 1 g tablet Take 1 tablet (1 g total) by mouth 4 (four) times daily -  with meals and at bedtime. 5 min before meals for radiation induced esophagitis 120 tablet 2  . Tiotropium Bromide Monohydrate (SPIRIVA RESPIMAT) 1.25 MCG/ACT AERS Take 1.25 mcg by mouth as needed (Daily as needed). 4 g 4  . TRELEGY ELLIPTA 100-62.5-25 MCG/INH AEPB INHALE 1 PUFF ONCE DAILY    . vitamin C (ASCORBIC ACID) 500 MG tablet Take 500 mg by mouth daily.     No current facility-administered medications for this encounter.    Physical Findings:  vitals were not taken for this visit.   /Unable to assess due to telephone follow-up visit format.  Lab Findings: Lab Results  Component Value Date   WBC 10.2 08/05/2020   HGB 11.9 (L) 08/05/2020   HCT 38.5 (L) 08/05/2020   MCV 88.7 08/05/2020   PLT 356 08/05/2020     Radiographic Findings: No results found.  Impression/Plan: 72. 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 [name] appears to have recovered well from the effects of his recent SBRT and is currently without complaints.  We discussed that while we are happy to continue to participate in his care if clinically indicated, at this point, we will plan to see him back on an as-needed basis.  He has scheduled follow-up with Dr. Delton Coombes on 10/23/2020 and will see his urologist, Dr. Will Bonnet in July 2022.  He knows that he is welcome to call at anytime with any questions or concerns related to his previous radiotherapy.    Andre Johns, PA-C

## 2020-10-22 NOTE — Progress Notes (Addendum)
[place], [place] [address]   CLINIC:  Medical Oncology/Hematology  PCP:  Joseph Art, MD [address] / [place] [address] [phone]   REASON FOR VISIT:  Follow-up for metastatic prostate cancer to intrathoracic lymph node  PRIOR THERAPY:  1. Docetaxel x 14 cycles from 08/11/2015 through 06/12/2016. 2. Abiraterone from 07/13/2016 through 04/09/2019. 3. Enzalutamide from 04/12/2019 to 11/03/2019.  NGS Results:  Foundation 1 MS--stable, TMB 4 Muts/Mb  CURRENT THERAPY: Surveillance  BRIEF ONCOLOGIC HISTORY:  Oncology History  Prostate cancer metastatic to intrathoracic lymph node ([place])  07/24/2015 Initial Diagnosis   Prostate cancer metastatic to the left supraclavicular and anterior mediastinal lymph nodes   08/21/2015 - 06/12/2016 Chemotherapy   Docetaxel every 3 weeks for 14 cycles, discontinued as PSA has plateaued around 11    07/13/2016 Treatment Plan Change   Zytiga 1000 milligrams daily along with prednisone 5 mg daily, started secondary to fluid overload from docetaxel   10/14/2018 Genetic Testing   Negative genetic testing.  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  The Common Hereditary Gene Panel offered by [facility] includes sequencing and/or deletion duplication testing of the following 48 genes: APC, ATM, AXIN2, BARD1, BMPR1A, BRCA1, BRCA2, BRIP1, CDH1, CDK4, CDKN2A (p14ARF), CDKN2A (p16INK4a), CHEK2, CTNNA1, DICER1, EPCAM (Deletion/duplication testing only), GREM1 (promoter region deletion/duplication testing only), KIT, MEN1, MLH1, MSH2, MSH3, MSH6, MUTYH, NBN, NF1, NHTL1, PALB2, PDGFRA, PMS2, POLD1, POLE, PTEN, RAD50, RAD51C, RAD51D, RNF43, SDHB, SDHC, SDHD, SMAD4, SMARCA4. STK11, TP53, TSC1, TSC2, and VHL.  The following genes were evaluated for sequence changes only: SDHA and HOXB13 c.251G>A variant only. The report date is Oct 14, 2018.   09/18/2019 Genetic Testing   Foundation One CDx       12/12/2019 Genetic Testing   Guardant 360         CANCER STAGING: Cancer Staging No matching staging information was found for the patient.  INTERVAL HISTORY:  Andre [name], a 80 y.o. 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 Jaquari was last seen on 08/05/2020.   Today he reports feeling good. He tolerated XRT well, and he denies any new pains. He reports numbness in his feet which is unchanged. He was hospitalized for bronchitis 08/07/2020.  REVIEW OF SYSTEMS:  Review of Systems  Constitutional: Negative for appetite change and fatigue.  All other systems reviewed and are negative.   PAST MEDICAL/SURGICAL HISTORY:  Past Medical History:  Diagnosis Date   COPD (chronic obstructive pulmonary disease) ([place])    Diabetes mellitus without complication ([facility])    Family history of ovarian cancer    Family history of prostate cancer    Hypertension    Prostate cancer ([place])    Past Surgical History:  Procedure Laterality Date   GOLD SEED IMPLANT      SOCIAL HISTORY:  Social History   Socioeconomic History   Marital status: Widowed    Spouse name: Not on file   Number of children: Not on file   Years of education: Not on file   Highest education level: Not on file  Occupational History   Not on file  Tobacco Use   Smoking status: Former Smoker    Packs/day: 0.25    Years: 60.00    Pack years: 15.00    Types: Cigarettes    Quit date: 06/25/2014    Years since quitting: 6.3   Smokeless tobacco: Never Used   Tobacco  comment: smoked since young age, quit in 2016  Substance and Sexual Activity   Alcohol use: Not Currently   Drug use: No   Sexual activity: Not Currently  Other Topics Concern   Not on file  Social History Narrative   Not on file   Social Determinants of Health   Financial Resource Strain: Low Risk    Difficulty of Paying Living Expenses: Not hard at all  Food Insecurity: No Food  Insecurity   Worried About Charity fundraiser in the Last Year: Never true   [place] in the Last Year: Never true  Transportation Needs: No Transportation Needs   Lack of Transportation (Medical): No   Lack of Transportation (Non-Medical): No  Physical Activity: Insufficiently Active   Days of Exercise per Week: 2 days   Minutes of Exercise per Session: 20 min  Stress: No Stress Concern Present   Feeling of Stress : Not at all  Social Connections: Moderately Isolated   Frequency of Communication with Friends and Family: More than three times a week   Frequency of Social Gatherings with Friends and Family: More than three times a week   Attends Religious Services: 1 to 4 times per year   Active Member of [employer] or Organizations: No   Attends Archivist Meetings: Never   Marital Status: Widowed  Human resources officer Violence: Not At Risk   Fear of Current or Ex-Partner: No   Emotionally Abused: No   Physically Abused: No   Sexually Abused: No    FAMILY HISTORY:  Family History  Problem Relation Age of Onset   Ovarian cancer Mother 8   Heart attack Father    Prostate cancer Maternal Uncle 27   Cancer Cousin        pat cousin with unknown cancer   Colon cancer Neg Hx    Pancreatic cancer Neg Hx    Breast cancer Neg Hx     CURRENT MEDICATIONS:  Current Outpatient Medications  Medication Sig Dispense Refill   ACCU-CHEK GUIDE test strip      Accu-Chek Softclix Lancets lancets      acetaminophen (TYLENOL) 500 MG tablet Take 500 mg by mouth every 6 (six) hours as needed for moderate pain.     amLODipine (NORVASC) 2.5 MG tablet Take 1 tablet (2.5 mg total) by mouth daily as needed. 30 tablet 0   aspirin EC 81 MG tablet Take 81 mg by mouth daily.     atorvastatin (LIPITOR) 10 MG tablet Take 10 mg by mouth at bedtime.     Blood Glucose Monitoring Suppl (ACCU-CHEK GUIDE) w/Device KIT      bumetanide (BUMEX) 1 MG tablet Take 1.5 tablets (1.5 mg total) by mouth daily.  45 tablet 6   CALCIUM PO Take 600 mg by mouth daily.      cetirizine (ZYRTEC) 10 MG tablet Take 10 mg by mouth daily.     CHERATUSSIN AC 100-10 MG/5ML syrup Take 5 mLs by mouth daily as needed.   0   Cholecalciferol (VITAMIN D-3 PO) Take 1 tablet by mouth daily.     cyclobenzaprine (FLEXERIL) 10 MG tablet Take 10 mg by mouth as needed.   0   dicyclomine (BENTYL) 10 MG capsule Take 10 mg by mouth daily.     dorzolamide (TRUSOPT) 2 % ophthalmic solution Place 1 drop into both eyes 3 (three) times daily.     ferrous sulfate 325 (65 FE) MG tablet Take 325 mg by mouth daily  with breakfast.     fluticasone (FLONASE) 50 MCG/ACT nasal spray USE 2 SPRAY(S) IN EACH NOSTRIL ONCE DAILY FOR 30 DAYS  12   ibuprofen (ADVIL,MOTRIN) 600 MG tablet Take 600 mg by mouth every 6 (six) hours as needed.   0   Latanoprostene Bunod (VYZULTA) 0.024 % SOLN Apply 1 drop to eye 1 day or 1 dose.     losartan (COZAAR) 100 MG tablet Take 100 mg by mouth daily.     magnesium oxide (MAG-OX) 400 MG tablet Take 1 tablet (400 mg total) by mouth every other day. 90 tablet 2   MAGNESIUM-OXIDE 400 (241.3 Mg) MG tablet TAKE 1 TABLET BY MOUTH EVERY OTHER DAY 90 tablet 6   metFORMIN (GLUCOPHAGE) 500 MG tablet Take 1,000 mg by mouth 2 (two) times daily with a meal.   3   potassium chloride (MICRO-K) 10 MEQ CR capsule Take 40 mEq by mouth daily.      prochlorperazine (COMPAZINE) 10 MG tablet Take 10 mg by mouth every 6 (six) hours as needed for nausea or vomiting.      sucralfate (CARAFATE) 1 g tablet Take 1 tablet (1 g total) by mouth 4 (four) times daily -  with meals and at bedtime. 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 4 g 4   TRELEGY ELLIPTA 100-62.5-25 MCG/INH AEPB INHALE 1 PUFF ONCE DAILY     vitamin C (ASCORBIC ACID) 500 MG tablet Take 500 mg by mouth daily.     No current  facility-administered medications for this visit.    ALLERGIES:  Allergies  Allergen Reactions   Metoprolol Other (See Comments) and Cough    Increases frequency of cough Other reaction(s): Other (See Comments) Increases frequency of cough    PHYSICAL EXAM:  Performance status (ECOG): 1 - Symptomatic but completely ambulatory  There were no vitals filed for this visit. Wt Readings from Last 3 Encounters:  08/05/20 239 lb 13.8 oz (108.8 kg)  05/22/20 239 lb (108.4 kg)  05/21/20 241 lb 3.2 oz (109.4 kg)   Physical Exam Vitals reviewed.  Constitutional:      Appearance: Normal appearance. He is obese.  Cardiovascular:     Rate and Rhythm: Normal rate and regular rhythm.     Pulses: Normal pulses.     Heart sounds: Normal heart sounds.  Pulmonary:     Effort: Pulmonary effort is normal.     Breath sounds: Normal breath sounds.  Neurological:     General: No focal deficit present.     Mental Status: He is alert and oriented to person, place, and time.  Psychiatric:        Mood and Affect: Mood normal.        Behavior: Behavior normal.      LABORATORY DATA:  I have reviewed the labs as listed.  CBC Latest Ref Rng & Units 08/05/2020 04/25/2020 02/06/2020  WBC 4.0 - 10.5 K/uL 10.2 10.9(H) 9.2  Hemoglobin 13.0 - 17.0 g/dL 11.9(L) 11.5(L) 11.7(L)  Hematocrit 39.0 - 52.0 % 38.5(L) 37.5(L) 37.6(L)  Platelets 150 - 400 K/uL 356 330 371   CMP Latest Ref Rng & Units 08/05/2020 04/25/2020 02/06/2020  Glucose 70 - 99 mg/dL 178(H) 120(H) 108(H)  BUN 8 - 23 mg/dL 19 14 15   Creatinine 0.61 - 1.24 mg/dL 0.97 1.00 0.96  Sodium 135 - 145 mmol/L 141 140  142  Potassium 3.5 - 5.1 mmol/L 4.5 4.4 4.1  Chloride 98 - 111 mmol/L 105 104 105  CO2 22 - 32 mmol/L 28 28 27   Calcium 8.9 - 10.3 mg/dL 10.0 9.7 9.9  Total Protein 6.5 - 8.1 g/dL 7.2 6.7 6.7  Total Bilirubin 0.3 - 1.2 mg/dL 0.6 0.6 0.8  Alkaline Phos 38 - 126 U/L 62 56 56  AST 15 - 41 U/L 16 16 16   ALT 0 - 44 U/L 12 10 9      DIAGNOSTIC IMAGING:  I have independently reviewed the scans and discussed with the patient. No results found.   ASSESSMENT:  1.  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 -Abiraterone and prednisone from 07/13/2016 through 04/09/2019, discontinued secondary to PSA progression. -Enzalutamide from 04/12/2019 through 11/03/2019 with progression. -Last Lupron on 06/09/2019. -CT CAP on 11/10/2019 shows no findings of active malignancy.  Stable small sclerotic lesions at T4 and T10, nonspecific.  Right hydronephrosis with right proximal hydroureter extending down to a 0.8 x 0.8 x 0.4 cm proximal ureteral stone at L3 vertical level.  Nonobstructive right and possibly left nephrolithiasis. -Bone scan on 11/10/2019 shows right proximal tibial metaphyseal activity from recent trauma.  Degenerative findings in the spine, right sternoclavicular joint, shoulders and right foot.  No evidence of metastatic disease. -Foundation 1 test shows MS-stable.  AR amplification.  Sensitivity is reduced due to sample quality. -Guardant 360 on 12/12/2019 MSI high not detected.  TMB was not evaluable.  No other targetable mutations. -F-18-PYLARIFY PET scan showed intense radiotracer activity in the left supraclavicular and prevascular lymph nodes, measuring 10 mm.  Cluster of small prevascular lymph nodes measures 5-7 mm each with SUV 54.  AP window lymph node measuring 8 mm.  No activity in the prostate gland or abdominal or pelvic lymph nodes.  No skeletal activity.   2.  Androgen deprivation therapy induced bone loss: -Received Zometa every 3 months for a year completed on 10/29/2017.   3.  Genetic testing: -Germline mutation testing was negative.   PLAN:  1.  Metastatic prostate cancer to the left supraclavicular and mediastinal lymph nodes: -Last Lupron was on  08/05/2020. - He underwent SBRT to the left supraclavicular lymph node and a prevascular mediastinal lymph node from 08/27/2020 through 09/06/2020, 40 Gray in 5 fractions. - He has tolerated radiation very well. - There is mild prominence in the left supraclavicular node area.  No other lymph nodes palpable. - Labs show normal LFTs and calcium.  Creatinine was also normal.  CBC was grossly normal. - PSA from today improved to 3.74. - RTC 3 months for follow-up.   2.  Androgen deprivation therapy induced bone loss: -Continue calcium and vitamin D supplements.  We will consider repeating bone density in the future.   3.  Leg swelling: -Continue Bumex as needed.   4.  Peripheral neuropathy: -His feet feel cold intermittently with no neuropathic pains.  This has been stable.   Orders placed this encounter:  No orders of the defined types were placed in this encounter.    Derek Jack, MD [place] [phone]   I, Thana Ates, am acting as a scribe for Dr. Derek Jack.  I, Derek Jack MD, have reviewed the above documentation for accuracy and completeness, and I agree with the above.

## 2020-10-23 ENCOUNTER — Other Ambulatory Visit (HOSPITAL_COMMUNITY): Payer: Medicare PPO

## 2020-10-23 ENCOUNTER — Other Ambulatory Visit: Payer: Self-pay

## 2020-10-23 ENCOUNTER — Ambulatory Visit (HOSPITAL_COMMUNITY): Payer: Medicare PPO | Admitting: Hematology

## 2020-10-23 ENCOUNTER — Encounter (HOSPITAL_COMMUNITY): Payer: Self-pay | Admitting: Hematology

## 2020-10-23 ENCOUNTER — Inpatient Hospital Stay (HOSPITAL_COMMUNITY): Payer: Medicare PPO | Attending: Hematology

## 2020-10-23 ENCOUNTER — Inpatient Hospital Stay (HOSPITAL_BASED_OUTPATIENT_CLINIC_OR_DEPARTMENT_OTHER): Payer: Medicare PPO | Admitting: Hematology

## 2020-10-23 VITALS — BP 136/73 | HR 82 | Temp 96.8°F | Resp 19 | Wt 239.9 lb

## 2020-10-23 DIAGNOSIS — C61 Malignant neoplasm of prostate: Secondary | ICD-10-CM

## 2020-10-23 DIAGNOSIS — C771 Secondary and unspecified malignant neoplasm of intrathoracic lymph nodes: Secondary | ICD-10-CM | POA: Diagnosis not present

## 2020-10-23 DIAGNOSIS — Z923 Personal history of irradiation: Secondary | ICD-10-CM | POA: Diagnosis not present

## 2020-10-23 DIAGNOSIS — C778 Secondary and unspecified malignant neoplasm of lymph nodes of multiple regions: Secondary | ICD-10-CM | POA: Insufficient documentation

## 2020-10-23 LAB — CBC WITH DIFFERENTIAL/PLATELET
Abs Immature Granulocytes: 0.04 10*3/uL (ref 0.00–0.07)
Basophils Absolute: 0.1 10*3/uL (ref 0.0–0.1)
Basophils Relative: 1 %
Eosinophils Absolute: 0.5 10*3/uL (ref 0.0–0.5)
Eosinophils Relative: 5 %
HCT: 37.8 % — ABNORMAL LOW (ref 39.0–52.0)
Hemoglobin: 11.9 g/dL — ABNORMAL LOW (ref 13.0–17.0)
Immature Granulocytes: 0 %
Lymphocytes Relative: 9 %
Lymphs Abs: 0.9 10*3/uL (ref 0.7–4.0)
MCH: 28.3 pg (ref 26.0–34.0)
MCHC: 31.5 g/dL (ref 30.0–36.0)
MCV: 90 fL (ref 80.0–100.0)
Monocytes Absolute: 1.1 10*3/uL — ABNORMAL HIGH (ref 0.1–1.0)
Monocytes Relative: 12 %
Neutro Abs: 7 10*3/uL (ref 1.7–7.7)
Neutrophils Relative %: 73 %
Platelets: 356 10*3/uL (ref 150–400)
RBC: 4.2 MIL/uL — ABNORMAL LOW (ref 4.22–5.81)
RDW: 15.2 % (ref 11.5–15.5)
WBC: 9.6 10*3/uL (ref 4.0–10.5)
nRBC: 0 % (ref 0.0–0.2)

## 2020-10-23 LAB — COMPREHENSIVE METABOLIC PANEL
ALT: 12 U/L (ref 0–44)
AST: 18 U/L (ref 15–41)
Albumin: 3.7 g/dL (ref 3.5–5.0)
Alkaline Phosphatase: 50 U/L (ref 38–126)
Anion gap: 9 (ref 5–15)
BUN: 15 mg/dL (ref 8–23)
CO2: 28 mmol/L (ref 22–32)
Calcium: 10 mg/dL (ref 8.9–10.3)
Chloride: 104 mmol/L (ref 98–111)
Creatinine, Ser: 0.91 mg/dL (ref 0.61–1.24)
GFR, Estimated: >60 mL/min (ref >60)
Glucose, Bld: 105 mg/dL — ABNORMAL HIGH (ref 70–99)
Potassium: 3.9 mmol/L (ref 3.5–5.1)
Sodium: 141 mmol/L (ref 135–145)
Total Bilirubin: 0.6 mg/dL (ref 0.3–1.2)
Total Protein: 6.9 g/dL (ref 6.5–8.1)

## 2020-10-23 LAB — PSA: Prostatic Specific Antigen: 3.74 ng/mL (ref 0.00–4.00)

## 2020-10-23 NOTE — Patient Instructions (Signed)
[facility] at [facility] Discharge Instructions  You were seen today by Dr. Katragadda. He went over your recent results. Dr. Katragadda will see you back in 3 months for labs and follow up.   Thank you for choosing [facility] at [facility] to provide your oncology and hematology care.  To afford each patient quality time with our provider, please arrive at least 15 minutes before your scheduled appointment time.   If you have a lab appointment with the Cancer Center please come in thru the Main Entrance and check in at the main information desk  You need to re-schedule your appointment should you arrive 10 or more minutes late.  We strive to give you quality time with our providers, and arriving late affects you and other patients whose appointments are after yours.  Also, if you no show three or more times for appointments you may be dismissed from the clinic at the providers discretion.     Again, thank you for choosing [facility].  Our hope is that these requests will decrease the amount of time that you wait before being seen by our physicians.       _____________________________________________________________  Should you have questions after your visit to [facility], please contact our office at [phone] between the hours of 8:00 a.m. and 4:30 p.m.  Voicemails left after 4:00 p.m. will not be returned until the following business day.  For prescription refill requests, have your pharmacy contact our office and allow 72 hours.    Cancer Center Support Programs:   > Cancer Support Group  2nd Tuesday of the month 1pm-2pm, Journey Room   

## 2020-11-21 ENCOUNTER — Encounter (HOSPITAL_COMMUNITY): Payer: Self-pay | Admitting: Hematology

## 2020-12-03 NOTE — Progress Notes (Signed)
[facility] Note  12/04/2020     CHIEF COMPLAINT Patient presents for Retina Evaluation   HISTORY OF PRESENT ILLNESS: Andre [name] is a 80 y.o. male who presents to the clinic today for:   HPI     Retina Evaluation   In right eye.  Duration of years.  Context:  reading.        Comments   Retina eval per Dr. Venetia [name] for ERM OD.  Patient has noticed vision isn't as good out of OD than before.  Using Dorzolamide BID OU and Vyzulta qhs OU. BS 116 this am A1C 7 PCIOL OU, POAG severe OU, Trabeculectomy OU, SLT OU      Last edited by Leonie Douglas, COA on 12/04/2020  2:12 PM.    Pt is here on the referral of Dr. Venetia [name] for ERM OD, pt saw Dr. Zigmund [name] in 2019 for the same problems, pt states his vision has improved since he was here last, but his right eye still looks a "little foggy", pt is being treated for glaucoma by Dr. Venetia [name], his last A1c was 8.7 on 03.18.22, pt sees Dr. Venetia [name] every 3-6 months for glaucoma  Referring physician: Marylynn Pearson MD [address], [place], [place] [address]  HISTORICAL INFORMATION:   Selected notes from the MEDICAL RECORD NUMBER Referred by Dr. Venetia [name] for ERM OD [name]: 10/30/2020 BCVA OD: 20/70-1 OS: 20/25 Ocular Hx- ERM, POAG PMH- DM, high cholesterol, breathing difficulty    CURRENT MEDICATIONS: Current Outpatient Medications (Ophthalmic Drugs)  Medication Sig   dorzolamide (TRUSOPT) 2 % ophthalmic solution Place 1 drop into both eyes 3 (three) times daily.   Latanoprostene Bunod (VYZULTA) 0.024 % SOLN Apply 1 drop to eye 1 day or 1 dose.   prednisoLONE acetate (PRED FORTE) 1 % ophthalmic suspension Place 1 drop into the right eye 4 (four) times daily for 7 days.   prednisoLONE acetate (PRED FORTE) 1 % ophthalmic suspension Place 1 drop into the right eye 4 (four) times daily for 7 days.   No current facility-administered medications for this visit. (Ophthalmic Drugs)   Current  Outpatient Medications (Other)  Medication Sig   acetaminophen (TYLENOL) 500 MG tablet Take 500 mg by mouth every 6 (six) hours as needed for moderate pain.   albuterol (ACCUNEB) 0.63 MG/3ML nebulizer solution Take 1 ampule by nebulization every 6 (six) hours as needed for wheezing.   amLODipine (NORVASC) 2.5 MG tablet Take 1 tablet (2.5 mg total) by mouth daily as needed.   aspirin EC 81 MG tablet Take 81 mg by mouth daily.   atorvastatin (LIPITOR) 10 MG tablet Take 10 mg by mouth at bedtime.   bumetanide (BUMEX) 1 MG tablet Take 1.5 tablets (1.5 mg total) by mouth daily.   CALCIUM PO Take 600 mg by mouth daily.    cetirizine (ZYRTEC) 10 MG tablet Take 10 mg by mouth daily.   CHERATUSSIN AC 100-10 MG/5ML syrup Take 5 mLs by mouth daily as needed.    Cholecalciferol (VITAMIN D-3 PO) Take 1 tablet by mouth daily.   cyclobenzaprine (FLEXERIL) 10 MG tablet Take 10 mg by mouth as needed.    dicyclomine (BENTYL) 10 MG capsule Take 10 mg by mouth daily.   ferrous sulfate 325 (65 FE) MG tablet Take 325 mg by mouth daily with breakfast.   fluticasone (FLONASE) 50 MCG/ACT nasal spray USE 2 SPRAY(S) IN EACH NOSTRIL ONCE DAILY FOR 30 DAYS   ibuprofen (ADVIL,MOTRIN) 600 MG  tablet Take 600 mg by mouth every 6 (six) hours as needed.    losartan (COZAAR) 100 MG tablet Take 100 mg by mouth daily.   magnesium oxide (MAG-OX) 400 MG tablet Take 1 tablet (400 mg total) by mouth every other day.   metFORMIN (GLUCOPHAGE) 500 MG tablet Take 1,000 mg by mouth 2 (two) times daily with a meal.    montelukast (SINGULAIR) 10 MG tablet Take 10 mg by mouth at bedtime.   potassium chloride (MICRO-K) 10 MEQ CR capsule Take 40 mEq by mouth daily.    prochlorperazine (COMPAZINE) 10 MG tablet Take 10 mg by mouth every 6 (six) hours as needed for nausea or vomiting.    sucralfate (CARAFATE) 1 g tablet Take 1 tablet (1 g total) by mouth 4 (four) times daily -  with meals and at bedtime. 5 min before meals for radiation induced  esophagitis   Tiotropium Bromide Monohydrate (SPIRIVA RESPIMAT) 1.25 MCG/ACT AERS Take 1.25 mcg by mouth as needed (Daily as needed).   TRELEGY ELLIPTA 100-62.5-25 MCG/INH AEPB INHALE 1 PUFF ONCE DAILY   vitamin C (ASCORBIC ACID) 500 MG tablet Take 500 mg by mouth daily.   ACCU-CHEK GUIDE test strip    Accu-Chek Softclix Lancets lancets    Blood Glucose Monitoring Suppl (ACCU-CHEK GUIDE) w/Device KIT    MAGNESIUM-OXIDE 400 (241.3 Mg) MG tablet TAKE 1 TABLET BY MOUTH EVERY OTHER DAY   No current facility-administered medications for this visit. (Other)      REVIEW OF SYSTEMS: ROS   Positive for: Eyes, Respiratory Negative for: Constitutional, Gastrointestinal, Neurological, Skin, Genitourinary, Musculoskeletal, HENT, Endocrine, Cardiovascular, Psychiatric, Allergic/Imm, Heme/Lymph Last edited by Leonie Douglas, COA on 12/04/2020  1:54 PM.       ALLERGIES Allergies  Allergen Reactions   Metoprolol Other (See Comments) and Cough    Increases frequency of cough Other reaction(s): Other (See Comments) Increases frequency of cough    PAST MEDICAL HISTORY Past Medical History:  Diagnosis Date   COPD (chronic obstructive pulmonary disease) ([place])    Diabetes mellitus without complication (H[facility])    Family history of ovarian cancer    Family history of prostate cancer    Hypertension    Prostate cancer ([place])    Past Surgical History:  Procedure Laterality Date   GOLD SEED IMPLANT      FAMILY HISTORY Family History  Problem Relation Age of Onset   Ovarian cancer Mother 3   Heart attack Father    Prostate cancer Maternal Uncle 53   Cancer Cousin        pat cousin with unknown cancer   Colon cancer Neg Hx    Pancreatic cancer Neg Hx    Breast cancer Neg Hx     SOCIAL HISTORY Social History   Tobacco Use   Smoking status: Former    Packs/day: 0.25    Years: 60.00    Pack years: 15.00    Types: Cigarettes    Quit date: 06/25/2014    Years since quitting: 6.4    Smokeless tobacco: Never   Tobacco comments:    smoked since young age, quit in 2016  Substance Use Topics   Alcohol use: Not Currently   Drug use: No         OPHTHALMIC EXAM:  Base Eye Exam     Visual Acuity (Snellen - Linear)       Right Left   Dist cc 20/100 20/30 +1   Dist ph cc 20/70 NI  Tonometry (Tonopen, 2:09 PM)       Right Left   Pressure 15 13         Pupils       Dark Light Shape React APD   Right 5 4 Round Brisk None   Left 5 4 Round Brisk None         Visual Fields (Counting fingers)       Left Right    Full    Restrictions  Total superior nasal deficiency; Partial outer superior temporal, inferior temporal deficiencies         Extraocular Movement       Right Left    Full Full         Neuro/Psych     Oriented x3: Yes   Mood/Affect: Normal         Dilation     Both eyes: 1.0% Mydriacyl, 2.5% Phenylephrine @ 2:09 PM           Slit Lamp and Fundus Exam     Slit Lamp Exam       Right Left   Lids/Lashes Dermatochalasis - upper lid Dermatochalasis - upper lid, mild LL Entropion   Conjunctiva/Sclera mild melanosis mild melanosis   Cornea arcus, trace PEE, trace tear film debris arcus, trace PEE, trace tear film debris   Anterior Chamber Deep and quiet Deep and quiet   Iris Round and dilated Round and dilated   Lens PC IOL in good position PC IOL in good position   Vitreous Vitreous syneresis Vitreous syneresis         Fundus Exam       Right Left   Disc +cupping, 3+Pallor, PPA/PPP +cupping, 3+Pallor, PPA/PPP   C/D Ratio 0.95 0.95   Macula Flat, Blunted foveal reflex, RPE mottling and clumping, No heme or edema Flat, Blunted foveal reflex, RPE mottling and clumping, No heme or edema   Vessels attenuated, mild tortuousity, mild Copper wiring, AV crossing changes attenuated, mild tortuousity   Periphery Attached, irregular retinal tear at 0700 with pigment deposition    Attached; no heme            Refraction     Wearing Rx       Sphere Cylinder Axis Add   Right -1.00 +1.00 085 +2.75   Left -2.25 +0.75 115 +2.75         Manifest Refraction       Sphere Cylinder Axis Dist VA   Right -1.00 +1.00+0.75 085 20/80-   Left -2.00 +0.50 120 20/25-            IMAGING AND PROCEDURES  Imaging and Procedures for 12/04/2020  OCT, Retina - OU - Both Eyes       Right Eye Quality was good. Central Foveal Thickness: 215. Progression has no prior data. Findings include normal foveal contour, no IRF, no SRF (Trace ERM, rare drusen).   Left Eye Quality was good. Central Foveal Thickness: 212. Progression has no prior data. Findings include normal foveal contour, no IRF, no SRF (Rare drusen).   Notes *Images captured and stored on drive  Diagnosis / Impression:  NFP, no IRF/SRF OU Rare drusen OU Tr ERM OD  Clinical management:  See below  Abbreviations: NFP - Normal foveal profile. CME - cystoid macular edema. PED - pigment epithelial detachment. IRF - intraretinal fluid. SRF - subretinal fluid. EZ - ellipsoid zone. ERM - epiretinal membrane. ORA - outer retinal atrophy. ORT - outer retinal tubulation. SRHM - subretinal hyper-reflective  material. IRHM - intraretinal hyper-reflective material      Repair Retinal Breaks, Laser - OD - Right Eye       LASER PROCEDURE NOTE  Procedure:  Barrier laser retinopexy using slit lamp laser, right eye   Diagnosis:   Retinal tear, right eye                     Irregular retinal tear w/ mild pigment deposition at 0700  Surgeon: Bernarda Caffey, MD, PhD  Anesthesia: Topical  Informed consent obtained, operative eye marked, and time out performed prior to initiation of laser.   Laser settings:  Lumenis Smart532 laser, slit lamp Lens: Mainster PRP 165 Power: 280 mW Spot size: 200 microns Duration: 30 msec  # spots: 331  Placement of laser: Using a Mainster PRP 165 contact lens at the slit lamp, laser was placed in three+  confluent rows around flap tear at 7 oclock anterior to equator.  Complications: None.  Patient tolerated the procedure well and received written and verbal post-procedure care information/education.              ASSESSMENT/PLAN:    ICD-10-CM   1. Retinal tear of right eye  H33.311 Repair Retinal Breaks, Laser - OD - Right Eye    2. Epiretinal membrane (ERM) of right eye  H35.371 OCT, Retina - OU - Both Eyes    3. Early dry stage nonexudative age-related macular degeneration of both eyes  H35.3131     4. Diabetes mellitus type 2 without retinopathy ([place])  E11.9     5. Essential hypertension  I10     6. Hypertensive retinopathy of both eyes  H35.033     7. Pseudophakia, both eyes  Z96.1     8. Primary open angle glaucoma (POAG) of both eyes, severe stage  H40.1133      1. Retinal tear, OD  - The incidence, risk factors, and natural history of retinal tear was discussed with patient.   - Potential treatment options including laser retinopexy and cryotherapy discussed with patient. - irregular tear located at 0700 with pigment deposition - recommend laser retinopexy OD today, 07.13.22 - pt wishes to proceed with laser - RBA of procedure discussed, questions answered - informed consent obtained and signed - see procedure note - start PF QID x7 days - f/u in 2-3 wks, DFE, OCT  2. Epiretinal membrane, right eye  - The natural history, anatomy, potential for loss of vision, and treatment options including vitrectomy techniques and the complications of endophthalmitis, retinal detachment, vitreous hemorrhage, cataract progression and permanent vision loss discussed with the patient. - very mild ERM -- not visually significant - BCVA 20/70 -- mostly from severe POAG - OCT today shows no progression from 2019 OCT - no metamorphopsia - no indication for surgery at this time - monitor  3. Age related macular degeneration, non-exudative, both eyes  - very early stage - The  incidence, anatomy, and pathology of dry AMD, risk of progression, and the AREDS and AREDS 2 study including smoking risks discussed with patient.  - Recommend amsler grid monitoring  4. Diabetes mellitus, type 2 without retinopathy - The incidence, risk factors for progression, natural history and treatment options for diabetic retinopathy  were discussed with patient.   - The need for close monitoring of blood glucose, blood pressure, and serum lipids, avoiding cigarette or any type of tobacco, and the need for long term follow up was also discussed with patient. - f/u in  1 year, sooner prn  5,6. Hypertensive retinopathy OU - discussed importance of tight BP control - monitor  7. Pseudophakia OU  - s/p CE/IOL OU  - IOLs in good position  - monitor  8. Severe POAG OU  - s/p trab and SLT OU  - IOP today: 13,15  - on dorzalamide BID OU and Vyzulta QHS OU  Ophthalmic Meds Ordered this visit:  Meds ordered this encounter  Medications   prednisoLONE acetate (PRED FORTE) 1 % ophthalmic suspension    Sig: Place 1 drop into the right eye 4 (four) times daily for 7 days.    Dispense:  10 mL    Refill:  0   prednisoLONE acetate (PRED FORTE) 1 % ophthalmic suspension    Sig: Place 1 drop into the right eye 4 (four) times daily for 7 days.    Dispense:  10 mL    Refill:  0      Return for f/u 2-3 weeks, retinal tear OD, DFE, OCT.  There are no Patient Instructions on file for this visit.   Explained the diagnoses, plan, and follow up with the patient and they expressed understanding.  Patient expressed understanding of the importance of proper follow up care.   This document serves as a record of services personally performed by Gardiner Sleeper, MD, PhD. It was created on their behalf by Roselee Nova, COMT. The creation of this record is the provider's dictation and/or activities during the visit.  Electronically signed by: Roselee Nova, COMT 12/04/20 4:35 PM  This document serves  as a record of services personally performed by Gardiner Sleeper, MD, PhD. It was created on their behalf by San Jetty. Owens Shark, OA an ophthalmic technician. The creation of this record is the provider's dictation and/or activities during the visit.    Electronically signed by: San Jetty. Marguerita Merles 07.13.2022 4:35 PM   Gardiner Sleeper, M.D., Ph.D. Diseases & Surgery of the Retina and Vitreous [facility]  I have reviewed the above documentation for accuracy and completeness, and I agree with the above. Gardiner Sleeper, M.D., Ph.D. 12/04/20 4:35 PM   Abbreviations: M myopia (nearsighted); A astigmatism; H hyperopia (farsighted); P presbyopia; Mrx spectacle prescription;  CTL contact lenses; OD right eye; OS left eye; OU both eyes  XT exotropia; ET esotropia; PEK punctate epithelial keratitis; PEE punctate epithelial erosions; DES dry eye syndrome; MGD meibomian gland dysfunction; ATs artificial tears; PFAT's preservative free artificial tears; [place] nuclear sclerotic cataract; PSC posterior subcapsular cataract; ERM epi-retinal membrane; PVD posterior vitreous detachment; RD retinal detachment; DM diabetes mellitus; DR diabetic retinopathy; NPDR non-proliferative diabetic retinopathy; PDR proliferative diabetic retinopathy; CSME clinically significant macular edema; DME diabetic macular edema; dbh dot blot hemorrhages; CWS cotton wool spot; POAG primary open angle glaucoma; C/D cup-to-disc ratio; HVF humphrey visual field; GVF goldmann visual field; OCT optical coherence tomography; IOP intraocular pressure; BRVO Branch retinal vein occlusion; CRVO central retinal vein occlusion; CRAO central retinal artery occlusion; BRAO branch retinal artery occlusion; RT retinal tear; SB scleral buckle; PPV pars plana vitrectomy; VH Vitreous hemorrhage; PRP panretinal laser photocoagulation; IVK intravitreal kenalog; VMT vitreomacular traction; MH Macular hole;  NVD neovascularization of the disc; NVE  neovascularization elsewhere; AREDS age related eye disease study; ARMD age related macular degeneration; POAG primary open angle glaucoma; EBMD epithelial/anterior basement membrane dystrophy; ACIOL anterior chamber intraocular lens; IOL intraocular lens; PCIOL posterior chamber intraocular lens; Phaco/IOL phacoemulsification with intraocular lens placement; PRK photorefractive keratectomy; LASIK laser  assisted in situ keratomileusis; HTN hypertension; DM diabetes mellitus; COPD chronic obstructive pulmonary disease

## 2020-12-04 ENCOUNTER — Other Ambulatory Visit: Payer: Self-pay

## 2020-12-04 ENCOUNTER — Encounter (INDEPENDENT_AMBULATORY_CARE_PROVIDER_SITE_OTHER): Payer: Self-pay | Admitting: Ophthalmology

## 2020-12-04 ENCOUNTER — Ambulatory Visit (INDEPENDENT_AMBULATORY_CARE_PROVIDER_SITE_OTHER): Payer: Medicare PPO | Admitting: Ophthalmology

## 2020-12-04 DIAGNOSIS — H33311 Horseshoe tear of retina without detachment, right eye: Secondary | ICD-10-CM

## 2020-12-04 DIAGNOSIS — H3581 Retinal edema: Secondary | ICD-10-CM

## 2020-12-04 DIAGNOSIS — H353131 Nonexudative age-related macular degeneration, bilateral, early dry stage: Secondary | ICD-10-CM

## 2020-12-04 DIAGNOSIS — I10 Essential (primary) hypertension: Secondary | ICD-10-CM

## 2020-12-04 DIAGNOSIS — H35371 Puckering of macula, right eye: Secondary | ICD-10-CM | POA: Diagnosis not present

## 2020-12-04 DIAGNOSIS — E119 Type 2 diabetes mellitus without complications: Secondary | ICD-10-CM

## 2020-12-04 DIAGNOSIS — Z961 Presence of intraocular lens: Secondary | ICD-10-CM

## 2020-12-04 DIAGNOSIS — H401133 Primary open-angle glaucoma, bilateral, severe stage: Secondary | ICD-10-CM

## 2020-12-04 DIAGNOSIS — H35033 Hypertensive retinopathy, bilateral: Secondary | ICD-10-CM

## 2020-12-04 MED ORDER — PREDNISOLONE ACETATE 1 % OP SUSP: 1.0000 [drp] | Freq: Four times a day (QID) | OPHTHALMIC | 0 refills | Status: AC

## 2020-12-24 ENCOUNTER — Other Ambulatory Visit: Payer: Self-pay

## 2020-12-24 ENCOUNTER — Encounter (INDEPENDENT_AMBULATORY_CARE_PROVIDER_SITE_OTHER): Payer: Self-pay | Admitting: Ophthalmology

## 2020-12-24 ENCOUNTER — Ambulatory Visit (INDEPENDENT_AMBULATORY_CARE_PROVIDER_SITE_OTHER): Payer: Medicare PPO | Admitting: Ophthalmology

## 2020-12-24 DIAGNOSIS — H35033 Hypertensive retinopathy, bilateral: Secondary | ICD-10-CM

## 2020-12-24 DIAGNOSIS — H35371 Puckering of macula, right eye: Secondary | ICD-10-CM

## 2020-12-24 DIAGNOSIS — H33311 Horseshoe tear of retina without detachment, right eye: Secondary | ICD-10-CM

## 2020-12-24 DIAGNOSIS — E119 Type 2 diabetes mellitus without complications: Secondary | ICD-10-CM | POA: Diagnosis not present

## 2020-12-24 DIAGNOSIS — H353131 Nonexudative age-related macular degeneration, bilateral, early dry stage: Secondary | ICD-10-CM

## 2020-12-24 DIAGNOSIS — H3581 Retinal edema: Secondary | ICD-10-CM

## 2020-12-24 DIAGNOSIS — Z961 Presence of intraocular lens: Secondary | ICD-10-CM

## 2020-12-24 DIAGNOSIS — I10 Essential (primary) hypertension: Secondary | ICD-10-CM

## 2020-12-24 DIAGNOSIS — H401133 Primary open-angle glaucoma, bilateral, severe stage: Secondary | ICD-10-CM

## 2020-12-24 NOTE — Progress Notes (Addendum)
[facility] Note  12/24/2020     CHIEF COMPLAINT Patient presents for Retina Follow Up   HISTORY OF PRESENT ILLNESS: Andre [name] is a 80 y.o. male who presents to the clinic today for:  HPI     Retina Follow Up   Patient presents with  Other.  In right eye.  This started 3 weeks ago.  I, the attending physician,  performed the HPI with the patient and updated documentation appropriately.        Comments   Patient here for 3 weeks retina follow up for retina tear OD. Patient states vision good so far. Getting better. No eye pain.       Last edited by Bernarda Caffey, MD on 12/25/2020 12:16 AM.    Pt states no change in vision, no problems after laser procedure at last visit, he used PF for 7 days as directed   Referring physician: Marylynn Pearson MD [address], [place], [place] [address]  HISTORICAL INFORMATION:   Selected notes from the MEDICAL RECORD NUMBER Referred by Dr. Venetia Maxon for ERM OD [name]: 10/30/2020 BCVA OD: 20/70-1 OS: 20/25 Ocular Hx- ERM, POAG PMH- DM, high cholesterol, breathing difficulty   CURRENT MEDICATIONS: Current Outpatient Medications (Ophthalmic Drugs)  Medication Sig   dorzolamide (TRUSOPT) 2 % ophthalmic solution Place 1 drop into both eyes 3 (three) times daily.   Latanoprostene Bunod (VYZULTA) 0.024 % SOLN Apply 1 drop to eye 1 day or 1 dose.   No current facility-administered medications for this visit. (Ophthalmic Drugs)   Current Outpatient Medications (Other)  Medication Sig   ACCU-CHEK GUIDE test strip    Accu-Chek Softclix Lancets lancets    acetaminophen (TYLENOL) 500 MG tablet Take 500 mg by mouth every 6 (six) hours as needed for moderate pain.   albuterol (ACCUNEB) 0.63 MG/3ML nebulizer solution Take 1 ampule by nebulization every 6 (six) hours as needed for wheezing.   amLODipine (NORVASC) 2.5 MG tablet Take 1 tablet (2.5 mg total) by mouth daily as needed.   aspirin EC 81 MG tablet Take  81 mg by mouth daily.   atorvastatin (LIPITOR) 10 MG tablet Take 10 mg by mouth at bedtime.   Blood Glucose Monitoring Suppl (ACCU-CHEK GUIDE) w/Device KIT    bumetanide (BUMEX) 1 MG tablet Take 1.5 tablets (1.5 mg total) by mouth daily.   CALCIUM PO Take 600 mg by mouth daily.    cetirizine (ZYRTEC) 10 MG tablet Take 10 mg by mouth daily.   CHERATUSSIN AC 100-10 MG/5ML syrup Take 5 mLs by mouth daily as needed.    Cholecalciferol (VITAMIN D-3 PO) Take 1 tablet by mouth daily.   cyclobenzaprine (FLEXERIL) 10 MG tablet Take 10 mg by mouth as needed.    dicyclomine (BENTYL) 10 MG capsule Take 10 mg by mouth daily.   ferrous sulfate 325 (65 FE) MG tablet Take 325 mg by mouth daily with breakfast.   fluticasone (FLONASE) 50 MCG/ACT nasal spray USE 2 SPRAY(S) IN EACH NOSTRIL ONCE DAILY FOR 30 DAYS   ibuprofen (ADVIL,MOTRIN) 600 MG tablet Take 600 mg by mouth every 6 (six) hours as needed.    losartan (COZAAR) 100 MG tablet Take 100 mg by mouth daily.   magnesium oxide (MAG-OX) 400 MG tablet Take 1 tablet (400 mg total) by mouth every other day.   MAGNESIUM-OXIDE 400 (241.3 Mg) MG tablet TAKE 1 TABLET BY MOUTH EVERY OTHER DAY   metFORMIN (GLUCOPHAGE) 500 MG tablet Take  1,000 mg by mouth 2 (two) times daily with a meal.    montelukast (SINGULAIR) 10 MG tablet Take 10 mg by mouth at bedtime.   potassium chloride (MICRO-K) 10 MEQ CR capsule Take 40 mEq by mouth daily.    prochlorperazine (COMPAZINE) 10 MG tablet Take 10 mg by mouth every 6 (six) hours as needed for nausea or vomiting.    sucralfate (CARAFATE) 1 g tablet Take 1 tablet (1 g total) by mouth 4 (four) times daily -  with meals and at bedtime. 5 min before meals for radiation induced esophagitis   Tiotropium Bromide Monohydrate (SPIRIVA RESPIMAT) 1.25 MCG/ACT AERS Take 1.25 mcg by mouth as needed (Daily as needed).   TRELEGY ELLIPTA 100-62.5-25 MCG/INH AEPB INHALE 1 PUFF ONCE DAILY   vitamin C (ASCORBIC ACID) 500 MG tablet Take 500 mg by  mouth daily.   No current facility-administered medications for this visit. (Other)      REVIEW OF SYSTEMS: ROS   Positive for: Eyes, Respiratory Negative for: Constitutional, Gastrointestinal, Neurological, Skin, Genitourinary, Musculoskeletal, HENT, Endocrine, Cardiovascular, Psychiatric, Allergic/Imm, Heme/Lymph Last edited by Theodore Demark, COA on 12/24/2020  2:05 PM.        ALLERGIES Allergies  Allergen Reactions   Metoprolol Other (See Comments) and Cough    Increases frequency of cough Other reaction(s): Other (See Comments) Increases frequency of cough    PAST MEDICAL HISTORY Past Medical History:  Diagnosis Date   COPD (chronic obstructive pulmonary disease) ([place])    Diabetes mellitus without complication ([facility])    Family history of ovarian cancer    Family history of prostate cancer    Hypertension    Prostate cancer ([place])    Past Surgical History:  Procedure Laterality Date   GOLD SEED IMPLANT      FAMILY HISTORY Family History  Problem Relation Age of Onset   Ovarian cancer Mother 79   Heart attack Father    Prostate cancer Maternal Uncle 65   Cancer Cousin        pat cousin with unknown cancer   Colon cancer Neg Hx    Pancreatic cancer Neg Hx    Breast cancer Neg Hx     SOCIAL HISTORY Social History   Tobacco Use   Smoking status: Former    Packs/day: 0.25    Years: 60.00    Pack years: 15.00    Types: Cigarettes    Quit date: 06/25/2014    Years since quitting: 6.5   Smokeless tobacco: Never   Tobacco comments:    smoked since young age, quit in 2016  Substance Use Topics   Alcohol use: Not Currently   Drug use: No         OPHTHALMIC EXAM:  Base Eye Exam     Visual Acuity (Snellen - Linear)       Right Left   Dist cc 20/80 -1 20/25   Dist ph cc NI NI    Correction: Glasses         Tonometry (Tonopen, 2:02 PM)       Right Left   Pressure 07 12         Pupils       Dark Light Shape React APD   Right 5 4  Round Brisk None   Left 5 4 Round Brisk None         Visual Fields (Counting fingers)       Left Right    Full    Restrictions  Total  superior nasal deficiency; Partial outer superior temporal, inferior temporal deficiencies         Extraocular Movement       Right Left    Full Full         Neuro/Psych     Oriented x3: Yes   Mood/Affect: Normal         Dilation     Both eyes: 1.0% Mydriacyl, 2.5% Phenylephrine @ 2:02 PM           Slit Lamp and Fundus Exam     Slit Lamp Exam       Right Left   Lids/Lashes Dermatochalasis - upper lid Dermatochalasis - upper lid, mild LL Entropion   Conjunctiva/Sclera mild melanosis mild melanosis, Conjunctivochalasis inferiorly   Cornea arcus, trace PEE, trace tear film debris arcus, trace PEE, trace tear film debris   Anterior Chamber Deep and quiet Deep and quiet   Iris Round and dilated Round and dilated   Lens PC IOL in good position PC IOL in good position   Vitreous Vitreous syneresis Vitreous syneresis         Fundus Exam       Right Left   Disc +cupping, 3+Pallor, PPA/PPP +cupping, 3+Pallor, PPA/PPP   C/D Ratio 0.95 0.95   Macula Flat, Blunted foveal reflex, RPE mottling and clumping, No heme or edema Flat, Blunted foveal reflex, RPE mottling and clumping, No heme or edema   Vessels attenuated, mild tortuousity, mild Copper wiring, AV crossing changes attenuated, mild tortuousity   Periphery Attached, irregular retinal tear at 0700 with pigment deposition -- good laser surrounding    Attached; no heme           Refraction     Wearing Rx       Sphere Cylinder Axis Add   Right -1.00 +1.00 085 +2.75   Left -2.25 +0.75 115 +2.75            IMAGING AND PROCEDURES  Imaging and Procedures for 12/24/2020  OCT, Retina - OU - Both Eyes       Right Eye Quality was good. Central Foveal Thickness: 225. Progression has been stable. Findings include normal foveal contour, no IRF, no SRF (Trace ERM, rare  drusen).   Left Eye Quality was good. Central Foveal Thickness: 216. Progression has been stable. Findings include normal foveal contour, no IRF, no SRF (Rare drusen).   Notes *Images captured and stored on drive  Diagnosis / Impression:  NFP, no IRF/SRF OU Rare drusen OU Tr ERM OD  Clinical management:  See below  Abbreviations: NFP - Normal foveal profile. CME - cystoid macular edema. PED - pigment epithelial detachment. IRF - intraretinal fluid. SRF - subretinal fluid. EZ - ellipsoid zone. ERM - epiretinal membrane. ORA - outer retinal atrophy. ORT - outer retinal tubulation. SRHM - subretinal hyper-reflective material. IRHM - intraretinal hyper-reflective material               ASSESSMENT/PLAN:    ICD-10-CM   1. Retinal tear of right eye  H33.311     2. Retinal edema  H35.81 OCT, Retina - OU - Both Eyes    3. Epiretinal membrane (ERM) of right eye  H35.371     4. Early dry stage nonexudative age-related macular degeneration of both eyes  H35.3131     5. Diabetes mellitus type 2 without retinopathy ([place])  E11.9     6. Essential hypertension  I10     7. Hypertensive retinopathy of both eyes  H35.033     8. Pseudophakia, both eyes  Z96.1     9. Primary open angle glaucoma (POAG) of both eyes, severe stage  H40.1133       1. Retinal tear, OD  - irregular tear located at 0700 with pigment deposition - s/p laser retinopexy OD 07.13.22 -- good laser surrounding - f/u in 2-3 months, DFE, OCT  2,3. Epiretinal membrane, right eye  - very mild ERM -- not visually significant - BCVA 20/80 -- mostly from severe POAG - OCT today shows no progression from 2019 OCT - no metamorphopsia - no indication for surgery at this time - monitor  4. Age related macular degeneration, non-exudative, both eyes  - very early stage - The incidence, anatomy, and pathology of dry AMD, risk of progression, and the AREDS and AREDS 2 study including smoking risks discussed with  patient.  - Recommend amsler grid monitoring  5. Diabetes mellitus, type 2 without retinopathy - The incidence, risk factors for progression, natural history and treatment options for diabetic retinopathy  were discussed with patient.   - The need for close monitoring of blood glucose, blood pressure, and serum lipids, avoiding cigarette or any type of tobacco, and the need for long term follow up was also discussed with patient. - f/u in 1 year, sooner prn  6,7. Hypertensive retinopathy OU - discussed importance of tight BP control - monitor  8. Pseudophakia OU  - s/p CE/IOL OU  - IOLs in good position  - monitor  9. Severe POAG OU  - s/p trab and SLT OU  - IOP today: 07,12  - on dorzalamide BID OU and Vyzulta QHS OU  Ophthalmic Meds Ordered this visit:  No orders of the defined types were placed in this encounter.     Return for f/u 2-3 months, retinal tear OD, DFE, OCT.  There are no Patient Instructions on file for this visit.  Explained the diagnoses, plan, and follow up with the patient and they expressed understanding.  Patient expressed understanding of the importance of proper follow up care.   This document serves as a record of services personally performed by Gardiner Sleeper, MD, PhD. It was created on their behalf by Estill Bakes, COT an ophthalmic technician. The creation of this record is the provider's dictation and/or activities during the visit.    Electronically signed by: Estill Bakes, COT 8.2.22 @ 12:18 AM   This document serves as a record of services personally performed by Gardiner Sleeper, MD, PhD. It was created on their behalf by San Jetty. Owens Shark, OA an ophthalmic technician. The creation of this record is the provider's dictation and/or activities during the visit.    Electronically signed by: San Jetty. Owens Shark, [place] 08.02.2022 12:18 AM   Gardiner Sleeper, M.D., Ph.D. Diseases & Surgery of the Retina and Vitreous [facility]  I have reviewed the above documentation for accuracy and completeness, and I agree with the above. Gardiner Sleeper, M.D., Ph.D. 12/25/20 12:18 AM  Abbreviations: M myopia (nearsighted); A astigmatism; H hyperopia (farsighted); P presbyopia; Mrx spectacle prescription;  CTL contact lenses; OD right eye; OS left eye; OU both eyes  XT exotropia; ET esotropia; PEK punctate epithelial keratitis; PEE punctate epithelial erosions; DES dry eye syndrome; MGD meibomian gland dysfunction; ATs artificial tears; PFAT's preservative free artificial tears; [place] nuclear sclerotic cataract; PSC posterior subcapsular cataract; ERM epi-retinal membrane; PVD posterior vitreous detachment; RD retinal detachment; DM diabetes mellitus; DR diabetic  retinopathy; NPDR non-proliferative diabetic retinopathy; PDR proliferative diabetic retinopathy; CSME clinically significant macular edema; DME diabetic macular edema; dbh dot blot hemorrhages; CWS cotton wool spot; POAG primary open angle glaucoma; C/D cup-to-disc ratio; HVF humphrey visual field; GVF goldmann visual field; OCT optical coherence tomography; IOP intraocular pressure; BRVO Branch retinal vein occlusion; CRVO central retinal vein occlusion; CRAO central retinal artery occlusion; BRAO branch retinal artery occlusion; RT retinal tear; SB scleral buckle; PPV pars plana vitrectomy; VH Vitreous hemorrhage; PRP panretinal laser photocoagulation; IVK intravitreal kenalog; VMT vitreomacular traction; MH Macular hole;  NVD neovascularization of the disc; NVE neovascularization elsewhere; AREDS age related eye disease study; ARMD age related macular degeneration; POAG primary open angle glaucoma; EBMD epithelial/anterior basement membrane dystrophy; ACIOL anterior chamber intraocular lens; IOL intraocular lens; PCIOL posterior chamber intraocular lens; Phaco/IOL phacoemulsification with intraocular lens placement; [place] photorefractive keratectomy; LASIK laser assisted in situ  keratomileusis; HTN hypertension; DM diabetes mellitus; COPD chronic obstructive pulmonary disease

## 2020-12-25 ENCOUNTER — Encounter (INDEPENDENT_AMBULATORY_CARE_PROVIDER_SITE_OTHER): Payer: Self-pay | Admitting: Ophthalmology

## 2021-01-21 ENCOUNTER — Other Ambulatory Visit (HOSPITAL_COMMUNITY): Payer: Self-pay | Admitting: *Deleted

## 2021-01-21 ENCOUNTER — Other Ambulatory Visit (HOSPITAL_COMMUNITY): Payer: Self-pay | Admitting: Hematology

## 2021-01-21 MED ORDER — POTASSIUM CHLORIDE ER 10 MEQ PO CPCR: ORAL_CAPSULE | ORAL | 3 refills | Status: DC

## 2021-02-04 NOTE — Progress Notes (Signed)
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 Docetaxel x 14 cycles from 08/11/2015 through 06/12/2016. 2. Abiraterone from 07/13/2016 through 04/09/2019. 3. Enzalutamide from 04/12/2019 to 11/03/2019.  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  The Common Hereditary Gene Panel offered by [facility] includes sequencing and/or deletion duplication testing of the following 48 genes: APC, ATM, AXIN2, BARD1, BMPR1A, BRCA1, BRCA2, BRIP1, CDH1, CDK4, CDKN2A (p14ARF), CDKN2A (p16INK4a), CHEK2, CTNNA1, DICER1, EPCAM (Deletion/duplication testing only), GREM1 (promoter region deletion/duplication testing only), KIT, MEN1, MLH1, MSH2, MSH3, MSH6, MUTYH, NBN, NF1, NHTL1, PALB2, PDGFRA, PMS2, POLD1, POLE, PTEN, RAD50, RAD51C, RAD51D, RNF43, SDHB, SDHC, SDHD, SMAD4, SMARCA4. STK11, TP53, TSC1, TSC2, and VHL.  The following genes were evaluated for sequence changes only: SDHA and HOXB13 c.251G>A variant only. The report date is Oct 14, 2018.   09/18/2019 Genetic Testing   Foundation One CDx       12/12/2019 Genetic Testing   Guardant 360         CANCER STAGING: Cancer Staging No matching staging information was found for the patient.  INTERVAL HISTORY:  Mr. BACILIO ABASCAL, a 80 y.o. 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 Tariq was last seen on 10/23/2020.   Today he reports feeling good. He denies any new pains and leg swellings have improved. He reports that he still has a cold sensation in his feet. He also reports occasional nausea after eating.   REVIEW OF SYSTEMS:  Review of Systems  Constitutional:  Negative for appetite change and fatigue.  Respiratory:  Positive for cough (COPD) and shortness of breath (COPD).   Gastrointestinal:  Positive for nausea (occasional).  All other systems reviewed and are negative.  PAST MEDICAL/SURGICAL HISTORY:  Past Medical History:  Diagnosis Date   COPD (chronic obstructive pulmonary disease) ([facility])    Diabetes mellitus without complication (H[facility])    Family history of ovarian cancer    Family history of prostate cancer    Hypertension    Prostate cancer ([place])    Past Surgical History:  Procedure Laterality Date   GOLD SEED IMPLANT      SOCIAL HISTORY:  Social History   Socioeconomic History   Marital status: Widowed    Spouse name: Not on file   Number of children: Not on file   Years of education: Not on file   Highest education level: Not on file  Occupational History   Not on file  Tobacco Use   Smoking status: Former    Packs/day: 0.25    Years: 60.00    Pack years: 15.00  Types: Cigarettes    Quit date: 06/25/2014    Years since quitting: 6.6   Smokeless tobacco: Never   Tobacco comments:    smoked since young age, quit in 2016  Substance and Sexual Activity   Alcohol use: Not Currently   Drug use: No   Sexual activity: Not Currently  Other Topics Concern   Not on file  Social History Narrative   Not on file   Social Determinants of  Health   Financial Resource Strain: Low Risk    Difficulty of Paying Living Expenses: Not hard at all  Food Insecurity: No Food Insecurity   Worried About Charity fundraiser in the Last Year: Never true   Ran Out of Food in the Last Year: Never true  Transportation Needs: No Transportation Needs   Lack of Transportation (Medical): No   Lack of Transportation (Non-Medical): No  Physical Activity: Insufficiently Active   Days of Exercise per Week: 2 days   Minutes of Exercise per Session: 20 min  Stress: No Stress Concern Present   Feeling of Stress : Not at all  Social Connections: Moderately Isolated   Frequency of Communication with Friends and Family: More than three times a week   Frequency of Social Gatherings with Friends and Family: More than three times a week   Attends Religious Services: 1 to 4 times per year   Active Member of [employer] or Organizations: No   Attends Archivist Meetings: Never   Marital Status: Widowed  Human resources officer Violence: Not At Risk   Fear of Current or Ex-Partner: No   Emotionally Abused: No   Physically Abused: No   Sexually Abused: No    FAMILY HISTORY:  Family History  Problem Relation Age of Onset   Ovarian cancer Mother 48   Heart attack Father    Prostate cancer Maternal Uncle 36   Cancer Cousin        pat cousin with unknown cancer   Colon cancer Neg Hx    Pancreatic cancer Neg Hx    Breast cancer Neg Hx     CURRENT MEDICATIONS:  Current Outpatient Medications  Medication Sig Dispense Refill   ACCU-CHEK GUIDE test strip      Accu-Chek Softclix Lancets lancets      acetaminophen (TYLENOL) 500 MG tablet Take 500 mg by mouth every 6 (six) hours as needed for moderate pain.     albuterol (ACCUNEB) 0.63 MG/3ML nebulizer solution Take 1 ampule by nebulization every 6 (six) hours as needed for wheezing.     amLODipine (NORVASC) 2.5 MG tablet Take 1 tablet (2.5 mg total) by mouth daily as needed. 30 tablet 0   aspirin EC 81  MG tablet Take 81 mg by mouth daily.     atorvastatin (LIPITOR) 10 MG tablet Take 10 mg by mouth at bedtime.     Blood Glucose Monitoring Suppl (ACCU-CHEK GUIDE) w/Device KIT      bumetanide (BUMEX) 1 MG tablet Take 1.5 tablets (1.5 mg total) by mouth daily. 45 tablet 6   CALCIUM PO Take 600 mg by mouth daily.      cetirizine (ZYRTEC) 10 MG tablet Take 10 mg by mouth daily.     CHERATUSSIN AC 100-10 MG/5ML syrup Take 5 mLs by mouth daily as needed.   0   Cholecalciferol (VITAMIN D-3 PO) Take 1 tablet by mouth daily.     cyclobenzaprine (FLEXERIL) 10 MG tablet Take 10 mg by mouth as needed.   0  dicyclomine (BENTYL) 10 MG capsule Take 10 mg by mouth daily.     dorzolamide (TRUSOPT) 2 % ophthalmic solution Place 1 drop into both eyes 3 (three) times daily.     ferrous sulfate 325 (65 FE) MG tablet Take 325 mg by mouth daily with breakfast.     fluticasone (FLONASE) 50 MCG/ACT nasal spray USE 2 SPRAY(S) IN EACH NOSTRIL ONCE DAILY FOR 30 DAYS  12   ibuprofen (ADVIL,MOTRIN) 600 MG tablet Take 600 mg by mouth every 6 (six) hours as needed.   0   Latanoprostene Bunod (VYZULTA) 0.024 % SOLN Apply 1 drop to eye 1 day or 1 dose.     losartan (COZAAR) 100 MG tablet Take 100 mg by mouth daily.     magnesium oxide (MAG-OX) 400 MG tablet Take 1 tablet (400 mg total) by mouth every other day. 90 tablet 2   MAGNESIUM-OXIDE 400 (241.3 Mg) MG tablet TAKE 1 TABLET BY MOUTH EVERY OTHER DAY 90 tablet 6   metFORMIN (GLUCOPHAGE) 500 MG tablet Take 1,000 mg by mouth 2 (two) times daily with a meal.   3   montelukast (SINGULAIR) 10 MG tablet Take 10 mg by mouth at bedtime.     potassium chloride (MICRO-K) 10 MEQ CR capsule TAKE 4 CAPSULES BY MOUTH WITH FOOD. OPEN AND SPRINKLE ON FOOD 120 capsule 3   prochlorperazine (COMPAZINE) 10 MG tablet Take 10 mg by mouth every 6 (six) hours as needed for nausea or vomiting.      sucralfate (CARAFATE) 1 g tablet Take 1 tablet (1 g total) by mouth 4 (four) times daily -  with  meals and at bedtime. 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 4 g 4   TRELEGY ELLIPTA 100-62.5-25 MCG/INH AEPB INHALE 1 PUFF ONCE DAILY     vitamin C (ASCORBIC ACID) 500 MG tablet Take 500 mg by mouth daily.     No current facility-administered medications for this visit.    ALLERGIES:  Allergies  Allergen Reactions   Metoprolol Other (See Comments) and Cough    Increases frequency of cough Other reaction(s): Other (See Comments) Increases frequency of cough    PHYSICAL EXAM:  Performance status (ECOG): 1 - Symptomatic but completely ambulatory  There were no vitals filed for this visit. Wt Readings from Last 3 Encounters:  10/23/20 239 lb 14.4 oz (108.8 kg)  08/05/20 239 lb 13.8 oz (108.8 kg)  05/22/20 239 lb (108.4 kg)   Physical Exam Vitals reviewed.  Constitutional:      Appearance: Normal appearance.  Cardiovascular:     Rate and Rhythm: Normal rate and regular rhythm.     Pulses: Normal pulses.     Heart sounds: Normal heart sounds.  Pulmonary:     Effort: Pulmonary effort is normal.     Breath sounds: Normal breath sounds.  Lymphadenopathy:     Cervical: No cervical adenopathy.     Right cervical: No superficial cervical adenopathy.    Left cervical: No superficial cervical adenopathy.     Upper Body:     Right upper body: No supraclavicular adenopathy.     Left upper body: No supraclavicular adenopathy.  Neurological:     General: No focal deficit present.     Mental Status: He is alert and oriented to person, place, and time.  Psychiatric:  Mood and Affect: Mood normal.        Behavior: Behavior normal.     LABORATORY DATA:  I have reviewed the labs as listed.  CBC Latest Ref Rng & Units 10/23/2020 08/05/2020 04/25/2020  WBC 4.0 - 10.5 K/uL 9.6 10.2 10.9(H)  Hemoglobin 13.0 - 17.0 g/dL 11.9(L) 11.9(L)  11.5(L)  Hematocrit 39.0 - 52.0 % 37.8(L) 38.5(L) 37.5(L)  Platelets 150 - 400 K/uL 356 356 330   CMP Latest Ref Rng & Units 10/23/2020 08/05/2020 04/25/2020  Glucose 70 - 99 mg/dL 105(H) 178(H) 120(H)  BUN 8 - 23 mg/dL 15 19 14   Creatinine 0.61 - 1.24 mg/dL 0.91 0.97 1.00  Sodium 135 - 145 mmol/L 141 141 140  Potassium 3.5 - 5.1 mmol/L 3.9 4.5 4.4  Chloride 98 - 111 mmol/L 104 105 104  CO2 22 - 32 mmol/L 28 28 28   Calcium 8.9 - 10.3 mg/dL 10.0 10.0 9.7  Total Protein 6.5 - 8.1 g/dL 6.9 7.2 6.7  Total Bilirubin 0.3 - 1.2 mg/dL 0.6 0.6 0.6  Alkaline Phos 38 - 126 U/L 50 62 56  AST 15 - 41 U/L 18 16 16   ALT 0 - 44 U/L 12 12 10     DIAGNOSTIC IMAGING:  I have independently reviewed the scans and discussed with the patient. No results found.   ASSESSMENT:  1.  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 -Abiraterone and prednisone from 07/13/2016 through 04/09/2019, discontinued secondary to PSA progression. -Enzalutamide from 04/12/2019 through 11/03/2019 with progression. -Last Lupron on 06/09/2019. -CT CAP on 11/10/2019 shows no findings of active malignancy.  Stable small sclerotic lesions at T4 and T10, nonspecific.  Right hydronephrosis with right proximal hydroureter extending down to a 0.8 x 0.8 x 0.4 cm proximal ureteral stone at L3 vertical level.  Nonobstructive right and possibly left nephrolithiasis. -Bone scan on 11/10/2019 shows right proximal tibial metaphyseal activity from recent trauma.  Degenerative findings in the spine, right sternoclavicular joint, shoulders and right foot.  No evidence of metastatic disease. -Foundation 1 test shows MS-stable.  AR amplification.  Sensitivity is reduced due to sample quality. -Guardant 360 on 12/12/2019 MSI high not detected.  TMB was not evaluable.  No other targetable mutations. -F-18-PYLARIFY PET  scan showed intense radiotracer activity in the left supraclavicular and prevascular lymph nodes, measuring 10 mm.  Cluster of small prevascular lymph nodes measures 5-7 mm each with SUV 54.  AP window lymph node measuring 8 mm.  No activity in the prostate gland or abdominal or pelvic lymph nodes.  No skeletal activity. - SBRT to the left supraclavicular lymph node and prevascular mediastinal lymph node from 08/27/2020 through 09/06/2020, 40 Gray in 5 fractions.   2.  Androgen deprivation therapy induced bone loss: -Received Zometa every 3 months for a year completed on 10/29/2017.   3.  Genetic testing: -Germline mutation testing was negative.   PLAN:  1.  Metastatic prostate cancer to the left supraclavicular and mediastinal lymph nodes: - He denies any new onset pains. - He reports occasional nausea on eating certain type of foods.  We will send a prescription for Compazine as needed.  He was also reportedly placed on 2 L oxygen via nasal cannula during exertion. - Last PSA has come down to 3.74.  PSA from today is pending. - He will receive Eligard 45 mg injection today. - Assuming his PSA is trending  down, we will reevaluate him in 6 months at the time of his next injection.   2.  Androgen deprivation therapy induced bone loss: - Continue calcium and vitamin D supplements.  Consider repeating bone density test in the future.   3.  Leg swelling: - Continue Bumex 1 and half tablet daily which is helping with the swelling.   4.  Peripheral neuropathy: - Feet feel cold intermittently with no neuropathic pains.  This is also stable.   Orders placed this encounter:  No orders of the defined types were placed in this encounter.    Derek Jack, MD [facility] [phone]   I, Thana Ates, am acting as a scribe for Dr. Derek Jack.  I, Derek Jack MD, have reviewed the above documentation for accuracy and completeness, and I agree with the  above.

## 2021-02-05 ENCOUNTER — Inpatient Hospital Stay (HOSPITAL_COMMUNITY): Payer: Medicare PPO | Attending: Hematology

## 2021-02-05 ENCOUNTER — Other Ambulatory Visit: Payer: Self-pay

## 2021-02-05 ENCOUNTER — Other Ambulatory Visit (HOSPITAL_COMMUNITY): Payer: Self-pay

## 2021-02-05 ENCOUNTER — Inpatient Hospital Stay (HOSPITAL_COMMUNITY): Payer: Medicare PPO

## 2021-02-05 ENCOUNTER — Inpatient Hospital Stay (HOSPITAL_BASED_OUTPATIENT_CLINIC_OR_DEPARTMENT_OTHER): Payer: Medicare PPO | Admitting: Hematology

## 2021-02-05 VITALS — BP 123/79 | HR 78 | Temp 98.3°F | Resp 16 | Wt 234.1 lb

## 2021-02-05 DIAGNOSIS — Z9221 Personal history of antineoplastic chemotherapy: Secondary | ICD-10-CM | POA: Insufficient documentation

## 2021-02-05 DIAGNOSIS — Z8041 Family history of malignant neoplasm of ovary: Secondary | ICD-10-CM | POA: Insufficient documentation

## 2021-02-05 DIAGNOSIS — Z87891 Personal history of nicotine dependence: Secondary | ICD-10-CM | POA: Diagnosis not present

## 2021-02-05 DIAGNOSIS — I10 Essential (primary) hypertension: Secondary | ICD-10-CM | POA: Insufficient documentation

## 2021-02-05 DIAGNOSIS — Z5111 Encounter for antineoplastic chemotherapy: Secondary | ICD-10-CM | POA: Insufficient documentation

## 2021-02-05 DIAGNOSIS — E119 Type 2 diabetes mellitus without complications: Secondary | ICD-10-CM | POA: Insufficient documentation

## 2021-02-05 DIAGNOSIS — C771 Secondary and unspecified malignant neoplasm of intrathoracic lymph nodes: Secondary | ICD-10-CM

## 2021-02-05 DIAGNOSIS — Z8042 Family history of malignant neoplasm of prostate: Secondary | ICD-10-CM | POA: Insufficient documentation

## 2021-02-05 DIAGNOSIS — C61 Malignant neoplasm of prostate: Secondary | ICD-10-CM

## 2021-02-05 DIAGNOSIS — Z79899 Other long term (current) drug therapy: Secondary | ICD-10-CM | POA: Diagnosis not present

## 2021-02-05 DIAGNOSIS — Z95828 Presence of other vascular implants and grafts: Secondary | ICD-10-CM

## 2021-02-05 DIAGNOSIS — Z7982 Long term (current) use of aspirin: Secondary | ICD-10-CM | POA: Diagnosis not present

## 2021-02-05 LAB — COMPREHENSIVE METABOLIC PANEL
ALT: 12 U/L (ref 0–44)
AST: 17 U/L (ref 15–41)
Albumin: 3.7 g/dL (ref 3.5–5.0)
Alkaline Phosphatase: 52 U/L (ref 38–126)
Anion gap: 9 (ref 5–15)
BUN: 16 mg/dL (ref 8–23)
CO2: 26 mmol/L (ref 22–32)
Calcium: 9.2 mg/dL (ref 8.9–10.3)
Chloride: 105 mmol/L (ref 98–111)
Creatinine, Ser: 0.88 mg/dL (ref 0.61–1.24)
GFR, Estimated: >60 mL/min (ref >60)
Glucose, Bld: 133 mg/dL — ABNORMAL HIGH (ref 70–99)
Potassium: 3.8 mmol/L (ref 3.5–5.1)
Sodium: 140 mmol/L (ref 135–145)
Total Bilirubin: 0.5 mg/dL (ref 0.3–1.2)
Total Protein: 6.6 g/dL (ref 6.5–8.1)

## 2021-02-05 LAB — CBC WITH DIFFERENTIAL/PLATELET
Abs Immature Granulocytes: 0.02 10*3/uL (ref 0.00–0.07)
Basophils Absolute: 0.1 10*3/uL (ref 0.0–0.1)
Basophils Relative: 1 %
Eosinophils Absolute: 0.2 10*3/uL (ref 0.0–0.5)
Eosinophils Relative: 3 %
HCT: 37.1 % — ABNORMAL LOW (ref 39.0–52.0)
Hemoglobin: 11.5 g/dL — ABNORMAL LOW (ref 13.0–17.0)
Immature Granulocytes: 0 %
Lymphocytes Relative: 11 %
Lymphs Abs: 0.9 10*3/uL (ref 0.7–4.0)
MCH: 27.4 pg (ref 26.0–34.0)
MCHC: 31 g/dL (ref 30.0–36.0)
MCV: 88.5 fL (ref 80.0–100.0)
Monocytes Absolute: 0.9 10*3/uL (ref 0.1–1.0)
Monocytes Relative: 11 %
Neutro Abs: 6 10*3/uL (ref 1.7–7.7)
Neutrophils Relative %: 74 %
Platelets: 346 10*3/uL (ref 150–400)
RBC: 4.19 MIL/uL — ABNORMAL LOW (ref 4.22–5.81)
RDW: 14.4 % (ref 11.5–15.5)
WBC: 8 10*3/uL (ref 4.0–10.5)
nRBC: 0 % (ref 0.0–0.2)

## 2021-02-05 LAB — PSA: Prostatic Specific Antigen: 1.49 ng/mL (ref 0.00–4.00)

## 2021-02-05 MED ORDER — LEUPROLIDE ACETATE (6 MONTH) 45 MG ~~LOC~~ KIT
45.0000 mg | PACK | Freq: Once | SUBCUTANEOUS | Status: AC
  Administered 2021-02-05: 45 mg via SUBCUTANEOUS
  Filled 2021-02-05: qty 45

## 2021-02-05 MED ORDER — SODIUM CHLORIDE 0.9% FLUSH
10.0000 mL | INTRAVENOUS | Status: DC | PRN
  Administered 2021-02-05: 10 mL via INTRAVENOUS

## 2021-02-05 MED ORDER — HEPARIN SOD (PORK) LOCK FLUSH 100 UNIT/ML IV SOLN
500.0000 [IU] | Freq: Once | INTRAVENOUS | Status: AC
  Administered 2021-02-05: 500 [IU] via INTRAVENOUS

## 2021-02-05 MED ORDER — PROCHLORPERAZINE MALEATE 10 MG PO TABS: 10.0000 mg | ORAL_TABLET | Freq: Three times a day (TID) | ORAL | 3 refills | Status: DC | PRN

## 2021-02-05 NOTE — Progress Notes (Signed)
Andre [name] presented for Portacath access and flush.  Portacath located right chest wall accessed with  H 20 needle.  Good blood return present. Portacath flushed with 2m NS and 500U/576mHeparin and needle removed intact. Band-aid applied. Procedure tolerated well and without incident. Patient remained stable throughout. Discharged ambulatory by wheelchair with family member.

## 2021-02-05 NOTE — Progress Notes (Signed)
Patient has been assessed, vital signs and labs have been reviewed by Dr. Katragadda. ANC, Creatinine, LFTs, and Platelets are within treatment parameters per Dr. Katragadda. The patient is good to proceed with treatment at this time. Primary RN and pharmacy aware.  

## 2021-02-05 NOTE — Patient Instructions (Addendum)
[facility] at [facility] Discharge Instructions  You were seen today by Dr. Delton Coombes. He went over your recent results, and you received your injection. Dr. Delton Coombes will see you back in 6 months for labs and follow up.   Thank you for choosing [facility] to provide your oncology and hematology care.  To afford each patient quality time with our provider, please arrive at least 15 minutes before your scheduled appointment time.   If you have a lab appointment with the [facility] please come in thru the Main Entrance and check in at the main information desk  You need to re-schedule your appointment should you arrive 10 or more minutes late.  We strive to give you quality time with our providers, and arriving late affects you and other patients whose appointments are after yours.  Also, if you no show three or more times for appointments you may be dismissed from the clinic at the providers discretion.     Again, thank you for choosing [facility].  Our hope is that these requests will decrease the amount of time that you wait before being seen by our physicians.       _____________________________________________________________  Should you have questions after your visit to [facility], please contact our office at [phone] between the hours of 8:00 a.m. and 4:30 p.m.  Voicemails left after 4:00 p.m. will not be returned until the following business day.  For prescription refill requests, have your pharmacy contact our office and allow 72 hours.    Cancer Center Support Programs:   > Cancer Support Group  2nd Tuesday of the month 1pm-2pm, Journey Room   Leuprolide depot injection What is this medication? LEUPROLIDE (loo PROE lide) is a man-made protein that acts like a natural hormone in the body. It decreases testosterone in men and decreases estrogen in women. In men, this medicine is used to  treat advanced prostate cancer. In women, some forms of this medicine may be used to treat endometriosis, uterine fibroids, or other male hormone-related problems. This medicine may be used for other purposes; ask your health care provider or pharmacist if you have questions. COMMON BRAND NAME(S): Eligard, Fensolv, Lupron Depot, Lupron Depot-Ped, Viadur What should I tell my care team before I take this medication? They need to know if you have any of these conditions: diabetes heart disease or previous heart attack high blood pressure high cholesterol mental illness osteoporosis pain or difficulty passing urine seizures spinal cord metastasis stroke suicidal thoughts, plans, or attempt; a previous suicide attempt by you or a family member tobacco smoker unusual vaginal bleeding (women) an unusual or allergic reaction to leuprolide, benzyl alcohol, other medicines, foods, dyes, or preservatives pregnant or trying to get pregnant breast-feeding How should I use this medication? This medicine is for injection into a muscle or for injection under the skin. It is given by a health care professional in a hospital or clinic setting. The specific product will determine how it will be given to you. Make sure you understand which product you receive and how often you will receive it. Talk to your pediatrician regarding the use of this medicine in children. Special care may be needed. Overdosage: If you think you have taken too much of this medicine contact a poison control center or emergency room at once. NOTE: This medicine is only for you. Do not share this medicine with others. What  if I miss a dose? It is important not to miss a dose. Call your doctor or health care professional if you are unable to keep an appointment. Depot injections: Depot injections are given either once-monthly, every 12 weeks, every 16 weeks, or every 24 weeks depending on the product you are prescribed. The product  you are prescribed will be based on if you are male or male, and your condition. Make sure you understand your product and dosing. What may interact with this medication? Do not take this medicine with any of the following medications: chasteberry cisapride dronedarone pimozide thioridazine This medicine may also interact with the following medications: herbal or dietary supplements, like black cohosh or DHEA male hormones, like estrogens or progestins and birth control pills, patches, rings, or injections male hormones, like testosterone other medicines that prolong the QT interval (abnormal heart rhythm) This list may not describe all possible interactions. Give your health care provider a list of all the medicines, herbs, non-prescription drugs, or dietary supplements you use. Also tell them if you smoke, drink alcohol, or use illegal drugs. Some items may interact with your medicine. What should I watch for while using this medication? Visit your doctor or health care professional for regular checks on your progress. During the first weeks of treatment, your symptoms may get worse, but then will improve as you continue your treatment. You may get hot flashes, increased bone pain, increased difficulty passing urine, or an aggravation of nerve symptoms. Discuss these effects with your doctor or health care professional, some of them may improve with continued use of this medicine. Male patients may experience a menstrual cycle or spotting during the first months of therapy with this medicine. If this continues, contact your doctor or health care professional. This medicine may increase blood sugar. Ask your healthcare provider if changes in diet or medicines are needed if you have diabetes. What side effects may I notice from receiving this medication? Side effects that you should report to your doctor or health care professional as soon as possible: allergic reactions like skin rash,  itching or hives, swelling of the face, lips, or tongue breathing problems chest pain depression or memory disorders pain in your legs or groin pain at site where injected or implanted seizures severe headache signs and symptoms of high blood sugar such as being more thirsty or hungry or having to urinate more than normal. You may also feel very tired or have blurry vision swelling of the feet and legs suicidal thoughts or other mood changes visual changes vomiting Side effects that usually do not require medical attention (report to your doctor or health care professional if they continue or are bothersome): breast swelling or tenderness decrease in sex drive or performance diarrhea hot flashes loss of appetite muscle, joint, or bone pains nausea redness or irritation at site where injected or implanted skin problems or acne This list may not describe all possible side effects. Call your doctor for medical advice about side effects. You may report side effects to FDA at 1-800-FDA-1088. Where should I keep my medication? This drug is given in a hospital or clinic and will not be stored at home. NOTE: This sheet is a summary. It may not cover all possible information. If you have questions about this medicine, talk to your doctor, pharmacist, or health care provider.  2022 Elsevier/Gold Standard (2019-04-12 10:35:13)

## 2021-02-05 NOTE — Progress Notes (Signed)
Eligard given in left arm.  Site clean, dry and intact.  Patient tolerated well and was discharged to home via w/c in stable condition with daughter.

## 2021-02-17 ENCOUNTER — Encounter (HOSPITAL_COMMUNITY): Payer: Self-pay | Admitting: Hematology

## 2021-02-27 NOTE — Progress Notes (Signed)
[facility] Note  02/28/2021     CHIEF COMPLAINT Patient presents for Retina Follow Up   HISTORY OF PRESENT ILLNESS: Andre [name] is a 80 y.o. male who presents to the clinic today for:  HPI     Retina Follow Up   Patient presents with  Retinal Break/Detachment.  In right eye.  This started weeks ago.  Severity is moderate.  Duration of weeks.  Since onset it is stable.  I, the attending physician,  performed the HPI with the patient and updated documentation appropriately.        Comments   Pt states vision is the same OU.  Pt denies eye pain OU.  Pt complains of FBS OU.  Pt denies floaters or fol OU.      Last edited by Bernarda Caffey, MD on 03/02/2021  4:58 PM.     Referring physician: Marylynn Pearson MD [address], [place], [place] [address]  HISTORICAL INFORMATION:  Selected notes from the MEDICAL RECORD NUMBER Referred by Dr. Venetia Maxon for ERM OD [name]: 10/30/2020 BCVA OD: 20/70-1 OS: 20/25 Ocular Hx- ERM, POAG PMH- DM, high cholesterol, breathing difficulty   CURRENT MEDICATIONS: Current Outpatient Medications (Ophthalmic Drugs)  Medication Sig   dorzolamide (TRUSOPT) 2 % ophthalmic solution Place 1 drop into both eyes 3 (three) times daily.   Latanoprostene Bunod (VYZULTA) 0.024 % SOLN Apply 1 drop to eye 1 day or 1 dose.   No current facility-administered medications for this visit. (Ophthalmic Drugs)   Current Outpatient Medications (Other)  Medication Sig   ACCU-CHEK GUIDE test strip    Accu-Chek Softclix Lancets lancets    acetaminophen (TYLENOL) 500 MG tablet Take 500 mg by mouth every 6 (six) hours as needed for moderate pain.   albuterol (ACCUNEB) 0.63 MG/3ML nebulizer solution Take 1 ampule by nebulization every 6 (six) hours as needed for wheezing.   amLODipine (NORVASC) 2.5 MG tablet Take 1 tablet (2.5 mg total) by mouth daily as needed.   aspirin EC 81 MG tablet Take 81 mg by mouth daily.   atorvastatin  (LIPITOR) 10 MG tablet Take 10 mg by mouth at bedtime.   Blood Glucose Monitoring Suppl (ACCU-CHEK GUIDE) w/Device KIT    bumetanide (BUMEX) 1 MG tablet Take 1.5 tablets (1.5 mg total) by mouth daily.   CALCIUM PO Take 600 mg by mouth daily.    cetirizine (ZYRTEC) 10 MG tablet Take 10 mg by mouth daily.   CHERATUSSIN AC 100-10 MG/5ML syrup Take 5 mLs by mouth daily as needed.    Cholecalciferol (VITAMIN D-3 PO) Take 1 tablet by mouth daily.   cyclobenzaprine (FLEXERIL) 10 MG tablet Take 10 mg by mouth as needed.    dicyclomine (BENTYL) 10 MG capsule Take 10 mg by mouth daily.   ferrous sulfate 325 (65 FE) MG tablet Take 325 mg by mouth daily with breakfast.   fluticasone (FLONASE) 50 MCG/ACT nasal spray USE 2 SPRAY(S) IN EACH NOSTRIL ONCE DAILY FOR 30 DAYS   ibuprofen (ADVIL,MOTRIN) 600 MG tablet Take 600 mg by mouth every 6 (six) hours as needed.    losartan (COZAAR) 100 MG tablet Take 100 mg by mouth daily.   magnesium oxide (MAG-OX) 400 MG tablet Take 1 tablet (400 mg total) by mouth every other day.   MAGNESIUM-OXIDE 400 (241.3 Mg) MG tablet TAKE 1 TABLET BY MOUTH EVERY OTHER DAY   metFORMIN (GLUCOPHAGE) 500 MG tablet Take 1,000 mg by mouth 2 (two) times  daily with a meal.    montelukast (SINGULAIR) 10 MG tablet Take 10 mg by mouth at bedtime.   potassium chloride (MICRO-K) 10 MEQ CR capsule TAKE 4 CAPSULES BY MOUTH WITH FOOD. OPEN AND SPRINKLE ON FOOD   prochlorperazine (COMPAZINE) 10 MG tablet Take 1 tablet (10 mg total) by mouth every 8 (eight) hours as needed for nausea or vomiting.   sucralfate (CARAFATE) 1 g tablet Take 1 tablet (1 g total) by mouth 4 (four) times daily -  with meals and at bedtime. 5 min before meals for radiation induced esophagitis   Tiotropium Bromide Monohydrate (SPIRIVA RESPIMAT) 1.25 MCG/ACT AERS Take 1.25 mcg by mouth as needed (Daily as needed).   TRELEGY ELLIPTA 100-62.5-25 MCG/INH AEPB INHALE 1 PUFF ONCE DAILY   vitamin C (ASCORBIC ACID) 500 MG tablet Take  500 mg by mouth daily.   No current facility-administered medications for this visit. (Other)   REVIEW OF SYSTEMS: ROS   Positive for: Eyes, Respiratory Negative for: Constitutional, Gastrointestinal, Neurological, Skin, Genitourinary, Musculoskeletal, HENT, Endocrine, Cardiovascular, Psychiatric, Allergic/Imm, Heme/Lymph Last edited by Doneen Poisson on 02/28/2021  2:01 PM.    ALLERGIES Allergies  Allergen Reactions   Metoprolol Other (See Comments) and Cough    Increases frequency of cough Other reaction(s): Other (See Comments) Increases frequency of cough   PAST MEDICAL HISTORY Past Medical History:  Diagnosis Date   COPD (chronic obstructive pulmonary disease) ([place])    Diabetes mellitus without complication (HCC)    Family history of ovarian cancer    Family history of prostate cancer    Hypertension    Prostate cancer ([place])    Past Surgical History:  Procedure Laterality Date   GOLD SEED IMPLANT      FAMILY HISTORY Family History  Problem Relation Age of Onset   Ovarian cancer Mother 90   Heart attack Father    Prostate cancer Maternal Uncle 13   Cancer Cousin        pat cousin with unknown cancer   Colon cancer Neg Hx    Pancreatic cancer Neg Hx    Breast cancer Neg Hx     SOCIAL HISTORY Social History   Tobacco Use   Smoking status: Former    Packs/day: 0.25    Years: 60.00    Pack years: 15.00    Types: Cigarettes    Quit date: 06/25/2014    Years since quitting: 6.6   Smokeless tobacco: Never   Tobacco comments:    smoked since young age, quit in 2016  Substance Use Topics   Alcohol use: Not Currently   Drug use: No       OPHTHALMIC EXAM:  Base Eye Exam     Visual Acuity (Snellen - Linear)       Right Left   Dist cc 20/80 +2 20/25 -2   Dist ph cc NI 20/25    Correction: Glasses         Tonometry (Tonopen, 2:05 PM)       Right Left   Pressure 16 14         Pupils       Dark Light Shape React APD   Right 4 3 Round  Minimal 0   Left 4 3 Round Minimal 0         Visual Fields       Left Right    Full    Restrictions  Total superior nasal deficiency; Partial outer superior temporal, inferior temporal deficiencies  Extraocular Movement       Right Left    Full Full         Neuro/Psych     Oriented x3: Yes   Mood/Affect: Normal         Dilation     Both eyes: 1.0% Mydriacyl, 2.5% Phenylephrine @ 2:05 PM           Slit Lamp and Fundus Exam     Slit Lamp Exam       Right Left   Lids/Lashes Dermatochalasis - upper lid Dermatochalasis - upper lid, mild LL Entropion   Conjunctiva/Sclera mild melanosis mild melanosis, Conjunctivochalasis inferiorly   Cornea arcus, trace PEE, trace tear film debris arcus, trace PEE, trace tear film debris   Anterior Chamber Deep and quiet Deep and quiet   Iris Round and dilated Round and dilated   Lens PC IOL in good position, open PC PC IOL in good position, open PC   Vitreous Vitreous syneresis Vitreous syneresis         Fundus Exam       Right Left   Disc +cupping, 3+Pallor, PPA/PPP +cupping, 3+Pallor, PPA/PPP   C/D Ratio 0.95 0.95   Macula Flat, Blunted foveal reflex, RPE mottling and clumping, No heme or edema Flat, Blunted foveal reflex, RPE mottling and clumping, No heme or edema   Vessels attenuated, mild tortuousity, mild Copper wiring, AV crossing changes attenuated, mild tortuousity   Periphery Attached, irregular retinal tear at 0700 with pigment deposition -- good laser surrounding, no new RT/RD Attached; no heme           Refraction     Wearing Rx       Sphere Cylinder Axis Add   Right -1.00 +1.00 085 +2.75   Left -2.25 +0.75 115 +2.75           IMAGING AND PROCEDURES  Imaging and Procedures for 02/28/2021  OCT, Retina - OU - Both Eyes       Right Eye Quality was good. Central Foveal Thickness: 223. Progression has been stable. Findings include normal foveal contour, no IRF, no SRF, retinal drusen  (Trace ERM, rare drusen).   Left Eye Quality was good. Central Foveal Thickness: 214. Progression has been stable. Findings include normal foveal contour, no IRF, no SRF, retinal drusen (Rare drusen).   Notes *Images captured and stored on drive  Diagnosis / Impression:  NFP, no IRF/SRF OU Rare drusen OU Tr ERM OD  Clinical management:  See below  Abbreviations: NFP - Normal foveal profile. CME - cystoid macular edema. PED - pigment epithelial detachment. IRF - intraretinal fluid. SRF - subretinal fluid. EZ - ellipsoid zone. ERM - epiretinal membrane. ORA - outer retinal atrophy. ORT - outer retinal tubulation. SRHM - subretinal hyper-reflective material. IRHM - intraretinal hyper-reflective material            ASSESSMENT/PLAN:    ICD-10-CM   1. Retinal tear of right eye  H33.311     2. Retinal edema  H35.81 OCT, Retina - OU - Both Eyes    3. Epiretinal membrane (ERM) of right eye  H35.371     4. Early dry stage nonexudative age-related macular degeneration of both eyes  H35.3131     5. Diabetes mellitus type 2 without retinopathy ([place])  E11.9     6. Essential hypertension  I10     7. Hypertensive retinopathy of both eyes  H35.033     8. Pseudophakia, both eyes  Z96.1  9. Primary open angle glaucoma (POAG) of both eyes, severe stage  H40.1133     1. Retinal tear, OD  - irregular tear located at 0700 with pigment deposition - s/p laser retinopexy OD 07.13.22 -- good laser surroundings - no new RT/RD - pt is cleared from a retina standpoint for release to Dr. Venetia Maxon and resumption of primary eye care   2,3. Epiretinal membrane, right eye  - very mild ERM -- not visually significant - BCVA 20/80 -- mostly from severe POAG - OCT today shows no progression from 2019 OCT - no metamorphopsia - no indication for surgery at this time - monitor  4. Age related macular degeneration, non-exudative, both eyes  - very early stage - The incidence, anatomy, and  pathology of dry AMD, risk of progression, and the AREDS and AREDS 2 study including smoking risks discussed with patient.  - Recommend amsler grid monitoring   5. Diabetes mellitus, type 2 without retinopathy - The incidence, risk factors for progression, natural history and treatment options for diabetic retinopathy  were discussed with patient.   - The need for close monitoring of blood glucose, blood pressure, and serum lipids, avoiding cigarette or any type of tobacco, and the need for long term follow up was also discussed with patient.  6,7. Hypertensive retinopathy OU - discussed importance of tight BP control - monitor   8. Pseudophakia OU  - s/p CE/IOL OU  - IOLs in good position  - monitor   9. Severe POAG OU  - s/p trab and SLT OU  - IOP today: 16, 14  - on dorzalamide BID OU and Vyzulta QHS OU  Ophthalmic Meds Ordered this visit:  No orders of the defined types were placed in this encounter.     Return if symptoms worsen or fail to improve.  There are no Patient Instructions on file for this visit.  Explained the diagnoses, plan, and follow up with the patient and they expressed understanding.  Patient expressed understanding of the importance of proper follow up care.   This document serves as a record of services personally performed by Gardiner Sleeper, MD, PhD. It was created on their behalf by Estill Bakes, COT an ophthalmic technician. The creation of this record is the provider's dictation and/or activities during the visit.    Electronically signed by: Estill Bakes, COT 10.7.22 @ 4:58 PM   Gardiner Sleeper, M.D., Ph.D. Diseases & Surgery of the Retina and [place] 02/28/2021  I have reviewed the above documentation for accuracy and completeness, and I agree with the above. Gardiner Sleeper, M.D., Ph.D. 03/02/21 5:00 PM   Abbreviations: M myopia (nearsighted); A astigmatism; H hyperopia (farsighted); P presbyopia; Mrx  spectacle prescription;  CTL contact lenses; OD right eye; OS left eye; OU both eyes  XT exotropia; ET esotropia; PEK punctate epithelial keratitis; PEE punctate epithelial erosions; DES dry eye syndrome; MGD meibomian gland dysfunction; ATs artificial tears; PFAT's preservative free artificial tears; [place] nuclear sclerotic cataract; PSC posterior subcapsular cataract; ERM epi-retinal membrane; PVD posterior vitreous detachment; RD retinal detachment; DM diabetes mellitus; DR diabetic retinopathy; NPDR non-proliferative diabetic retinopathy; PDR proliferative diabetic retinopathy; CSME clinically significant macular edema; DME diabetic macular edema; dbh dot blot hemorrhages; CWS cotton wool spot; POAG primary open angle glaucoma; C/D cup-to-disc ratio; HVF humphrey visual field; GVF goldmann visual field; OCT optical coherence tomography; IOP intraocular pressure; BRVO Branch retinal vein occlusion; CRVO central retinal vein occlusion; CRAO central retinal  artery occlusion; BRAO branch retinal artery occlusion; RT retinal tear; SB scleral buckle; PPV pars plana vitrectomy; VH Vitreous hemorrhage; PRP panretinal laser photocoagulation; IVK intravitreal kenalog; VMT vitreomacular traction; MH Macular hole;  NVD neovascularization of the disc; NVE neovascularization elsewhere; AREDS age related eye disease study; ARMD age related macular degeneration; POAG primary open angle glaucoma; EBMD epithelial/anterior basement membrane dystrophy; ACIOL anterior chamber intraocular lens; IOL intraocular lens; PCIOL posterior chamber intraocular lens; Phaco/IOL phacoemulsification with intraocular lens placement; [place] photorefractive keratectomy; LASIK laser assisted in situ keratomileusis; HTN hypertension; DM diabetes mellitus; COPD chronic obstructive pulmonary disease

## 2021-02-28 ENCOUNTER — Other Ambulatory Visit: Payer: Self-pay

## 2021-02-28 ENCOUNTER — Ambulatory Visit (INDEPENDENT_AMBULATORY_CARE_PROVIDER_SITE_OTHER): Payer: Medicare PPO | Admitting: Ophthalmology

## 2021-02-28 DIAGNOSIS — H35371 Puckering of macula, right eye: Secondary | ICD-10-CM | POA: Diagnosis not present

## 2021-02-28 DIAGNOSIS — H33311 Horseshoe tear of retina without detachment, right eye: Secondary | ICD-10-CM

## 2021-02-28 DIAGNOSIS — H353131 Nonexudative age-related macular degeneration, bilateral, early dry stage: Secondary | ICD-10-CM

## 2021-02-28 DIAGNOSIS — E119 Type 2 diabetes mellitus without complications: Secondary | ICD-10-CM | POA: Diagnosis not present

## 2021-02-28 DIAGNOSIS — H35033 Hypertensive retinopathy, bilateral: Secondary | ICD-10-CM

## 2021-02-28 DIAGNOSIS — H3581 Retinal edema: Secondary | ICD-10-CM

## 2021-02-28 DIAGNOSIS — Z961 Presence of intraocular lens: Secondary | ICD-10-CM

## 2021-02-28 DIAGNOSIS — H401133 Primary open-angle glaucoma, bilateral, severe stage: Secondary | ICD-10-CM

## 2021-02-28 DIAGNOSIS — I10 Essential (primary) hypertension: Secondary | ICD-10-CM

## 2021-03-02 ENCOUNTER — Encounter (INDEPENDENT_AMBULATORY_CARE_PROVIDER_SITE_OTHER): Payer: Self-pay | Admitting: Ophthalmology

## 2021-05-07 ENCOUNTER — Encounter (HOSPITAL_COMMUNITY): Payer: Self-pay

## 2021-05-07 ENCOUNTER — Inpatient Hospital Stay (HOSPITAL_COMMUNITY): Payer: Medicare PPO | Attending: Hematology

## 2021-05-07 ENCOUNTER — Other Ambulatory Visit: Payer: Self-pay

## 2021-05-07 DIAGNOSIS — C771 Secondary and unspecified malignant neoplasm of intrathoracic lymph nodes: Secondary | ICD-10-CM | POA: Insufficient documentation

## 2021-05-07 DIAGNOSIS — Z452 Encounter for adjustment and management of vascular access device: Secondary | ICD-10-CM | POA: Insufficient documentation

## 2021-05-07 DIAGNOSIS — C61 Malignant neoplasm of prostate: Secondary | ICD-10-CM | POA: Insufficient documentation

## 2021-05-07 MED ORDER — SODIUM CHLORIDE 0.9% FLUSH
10.0000 mL | Freq: Once | INTRAVENOUS | Status: AC
  Administered 2021-05-07: 14:00:00 10 mL

## 2021-05-07 MED ORDER — HEPARIN SOD (PORK) LOCK FLUSH 100 UNIT/ML IV SOLN
500.0000 [IU] | Freq: Once | INTRAVENOUS | Status: AC
  Administered 2021-05-07: 14:00:00 500 [IU] via INTRAVENOUS

## 2021-05-07 NOTE — Progress Notes (Signed)
Dorothyann Gibbs presented for Portacath access and flush.  Portacath located right chest wall accessed with  H 20 needle.  Good blood return present. Portacath flushed with 10 ml NS and 500U/42ml Heparin and needle removed intact.  Procedure tolerated well and without incident.  No complaints at this time. Discharged from clinic ambulatory in stable condition. Alert and oriented x 3. F/U with [facility] as scheduled.

## 2021-06-05 ENCOUNTER — Other Ambulatory Visit (HOSPITAL_COMMUNITY): Payer: Self-pay | Admitting: Hematology

## 2021-06-17 ENCOUNTER — Other Ambulatory Visit (HOSPITAL_COMMUNITY): Payer: Self-pay | Admitting: Hematology

## 2021-06-17 DIAGNOSIS — C61 Malignant neoplasm of prostate: Secondary | ICD-10-CM

## 2021-08-06 ENCOUNTER — Encounter (HOSPITAL_COMMUNITY): Payer: Self-pay | Admitting: Hematology

## 2021-08-06 ENCOUNTER — Other Ambulatory Visit: Payer: Self-pay

## 2021-08-06 ENCOUNTER — Inpatient Hospital Stay (HOSPITAL_BASED_OUTPATIENT_CLINIC_OR_DEPARTMENT_OTHER): Payer: Medicare PPO | Admitting: Hematology

## 2021-08-06 ENCOUNTER — Inpatient Hospital Stay (HOSPITAL_COMMUNITY): Payer: Medicare PPO | Attending: Hematology

## 2021-08-06 ENCOUNTER — Inpatient Hospital Stay (HOSPITAL_COMMUNITY): Payer: Medicare PPO

## 2021-08-06 VITALS — BP 146/74 | HR 67 | Temp 97.0°F | Resp 20 | Wt 245.6 lb

## 2021-08-06 DIAGNOSIS — C61 Malignant neoplasm of prostate: Secondary | ICD-10-CM | POA: Diagnosis not present

## 2021-08-06 DIAGNOSIS — D649 Anemia, unspecified: Secondary | ICD-10-CM | POA: Insufficient documentation

## 2021-08-06 DIAGNOSIS — C771 Secondary and unspecified malignant neoplasm of intrathoracic lymph nodes: Secondary | ICD-10-CM | POA: Insufficient documentation

## 2021-08-06 DIAGNOSIS — Z79899 Other long term (current) drug therapy: Secondary | ICD-10-CM | POA: Diagnosis not present

## 2021-08-06 DIAGNOSIS — M7989 Other specified soft tissue disorders: Secondary | ICD-10-CM | POA: Diagnosis not present

## 2021-08-06 DIAGNOSIS — G629 Polyneuropathy, unspecified: Secondary | ICD-10-CM | POA: Diagnosis not present

## 2021-08-06 DIAGNOSIS — Z87891 Personal history of nicotine dependence: Secondary | ICD-10-CM | POA: Diagnosis not present

## 2021-08-06 DIAGNOSIS — Z5111 Encounter for antineoplastic chemotherapy: Secondary | ICD-10-CM | POA: Diagnosis present

## 2021-08-06 LAB — COMPREHENSIVE METABOLIC PANEL
ALT: 12 U/L (ref 0–44)
AST: 14 U/L — ABNORMAL LOW (ref 15–41)
Albumin: 3.5 g/dL (ref 3.5–5.0)
Alkaline Phosphatase: 56 U/L (ref 38–126)
Anion gap: 6 (ref 5–15)
BUN: 14 mg/dL (ref 8–23)
CO2: 25 mmol/L (ref 22–32)
Calcium: 9.1 mg/dL (ref 8.9–10.3)
Chloride: 106 mmol/L (ref 98–111)
Creatinine, Ser: 0.88 mg/dL (ref 0.61–1.24)
GFR, Estimated: >60 mL/min (ref >60)
Glucose, Bld: 155 mg/dL — ABNORMAL HIGH (ref 70–99)
Potassium: 3.9 mmol/L (ref 3.5–5.1)
Sodium: 137 mmol/L (ref 135–145)
Total Bilirubin: 0.5 mg/dL (ref 0.3–1.2)
Total Protein: 6.5 g/dL (ref 6.5–8.1)

## 2021-08-06 LAB — CBC WITH DIFFERENTIAL/PLATELET
Abs Immature Granulocytes: 0.04 10*3/uL (ref 0.00–0.07)
Basophils Absolute: 0.1 10*3/uL (ref 0.0–0.1)
Basophils Relative: 1 %
Eosinophils Absolute: 0.1 10*3/uL (ref 0.0–0.5)
Eosinophils Relative: 2 %
HCT: 34.2 % — ABNORMAL LOW (ref 39.0–52.0)
Hemoglobin: 10.3 g/dL — ABNORMAL LOW (ref 13.0–17.0)
Immature Granulocytes: 1 %
Lymphocytes Relative: 12 %
Lymphs Abs: 1 10*3/uL (ref 0.7–4.0)
MCH: 25.2 pg — ABNORMAL LOW (ref 26.0–34.0)
MCHC: 30.1 g/dL (ref 30.0–36.0)
MCV: 83.6 fL (ref 80.0–100.0)
Monocytes Absolute: 1.1 10*3/uL — ABNORMAL HIGH (ref 0.1–1.0)
Monocytes Relative: 12 %
Neutro Abs: 6.4 10*3/uL (ref 1.7–7.7)
Neutrophils Relative %: 72 %
Platelets: 359 10*3/uL (ref 150–400)
RBC: 4.09 MIL/uL — ABNORMAL LOW (ref 4.22–5.81)
RDW: 14.8 % (ref 11.5–15.5)
WBC: 8.8 10*3/uL (ref 4.0–10.5)
nRBC: 0 % (ref 0.0–0.2)

## 2021-08-06 LAB — FERRITIN: Ferritin: 17 ng/mL — ABNORMAL LOW (ref 24–336)

## 2021-08-06 LAB — PSA: Prostatic Specific Antigen: 8.6 ng/mL — ABNORMAL HIGH (ref 0.00–4.00)

## 2021-08-06 LAB — IRON AND TIBC
Iron: 51 ug/dL (ref 45–182)
Saturation Ratios: 15 % — ABNORMAL LOW (ref 17.9–39.5)
TIBC: 344 ug/dL (ref 250–450)
UIBC: 293 ug/dL

## 2021-08-06 MED ORDER — LEUPROLIDE ACETATE (6 MONTH) 45 MG ~~LOC~~ KIT
45.0000 mg | PACK | Freq: Once | SUBCUTANEOUS | Status: AC
  Administered 2021-08-06: 45 mg via SUBCUTANEOUS
  Filled 2021-08-06: qty 45

## 2021-08-06 MED ORDER — HEPARIN SOD (PORK) LOCK FLUSH 100 UNIT/ML IV SOLN
500.0000 [IU] | Freq: Once | INTRAVENOUS | Status: AC
  Administered 2021-08-06: 500 [IU] via INTRAVENOUS

## 2021-08-06 MED ORDER — SODIUM CHLORIDE 0.9% FLUSH
10.0000 mL | Freq: Once | INTRAVENOUS | Status: AC
  Administered 2021-08-06: 10 mL via INTRAVENOUS

## 2021-08-06 NOTE — Progress Notes (Signed)
Patients port flushed without difficulty.  Good blood return noted with no bruising or swelling noted at site.  Band aid applied.  VSS with discharge and left in satisfactory condition with no s/s of distress noted.   ? ?Patient tolerated Eligard injection with no complaints voiced.  Site clean and dry with no bruising or swelling noted at site.  See MAR for details.  Band aid applied.  Patient stable during and after injection.  Vss with discharge and left in satisfactory condition with no s/s of distress noted.   ?

## 2021-08-06 NOTE — Patient Instructions (Signed)
[place] at [facility] ?Discharge Instructions ? ?You were seen and examined today by Dr. Delton Coombes. He reviewed your most recent labs and everything looks okay. He is going to try and add on a Ferritin and Iron panel to his labs that he had done today. Please keep follow up appointments as scheduled in 6 months. ? ? ?Thank you for choosing [place] at [facility] to provide your oncology and hematology care.  To afford each patient quality time with our provider, please arrive at least 15 minutes before your scheduled appointment time.  ? ?If you have a lab appointment with the [place] [facility] please come in thru the Main Entrance and check in at the main information desk. ? ?You need to re-schedule your appointment should you arrive 10 or more minutes late.  We strive to give you quality time with our providers, and arriving late affects you and other patients whose appointments are after yours.  Also, if you no show three or more times for appointments you may be dismissed from the clinic at the providers discretion.     ?Again, thank you for choosing [facility].  Our hope is that these requests will decrease the amount of time that you wait before being seen by our physicians.       ?_____________________________________________________________ ? ?Should you have questions after your visit to [facility], please contact our office at [phone] and follow the prompts.  Our office hours are 8:00 a.m. and 4:30 p.m. Monday - Friday.  Please note that voicemails left after 4:00 p.m. may not be returned until the following business day.  We are closed weekends and major holidays.  You do have access to a nurse 24-7, just call the main number to the clinic [phone] and do not press any options, hold on the line and a nurse will answer the phone.   ? ?For prescription refill requests, have your pharmacy contact our office and allow 72  hours.   ? ?Due to Covid, you will need to wear a mask upon entering the hospital. If you do not have a mask, a mask will be given to you at the Main Entrance upon arrival. For doctor visits, patients may have 1 support person age 1 or older with them. For treatment visits, patients can not have anyone with them due to social distancing guidelines and our immunocompromised population.  ? ?  ?

## 2021-08-06 NOTE — Patient Instructions (Signed)
[facility]  Discharge Instructions: Thank you for choosing [facility] to provide your oncology and hematology care.  If you have a lab appointment with the Cancer Center, please come in thru the Main Entrance and check in at the main information desk.  Wear comfortable clothing and clothing appropriate for easy access to any Portacath or PICC line.   We strive to give you quality time with your provider. You may need to reschedule your appointment if you arrive late (15 or more minutes).  Arriving late affects you and other patients whose appointments are after yours.  Also, if you miss three or more appointments without notifying the office, you may be dismissed from the clinic at the provider's discretion.      For prescription refill requests, have your pharmacy contact our office and allow 72 hours for refills to be completed.        To help prevent nausea and vomiting after your treatment, we encourage you to take your nausea medication as directed.  BELOW ARE SYMPTOMS THAT SHOULD BE REPORTED IMMEDIATELY: *FEVER GREATER THAN 100.4 F (38 C) OR HIGHER *CHILLS OR SWEATING *NAUSEA AND VOMITING THAT IS NOT CONTROLLED WITH YOUR NAUSEA MEDICATION *UNUSUAL SHORTNESS OF BREATH *UNUSUAL BRUISING OR BLEEDING *URINARY PROBLEMS (pain or burning when urinating, or frequent urination) *BOWEL PROBLEMS (unusual diarrhea, constipation, pain near the anus) TENDERNESS IN MOUTH AND THROAT WITH OR WITHOUT PRESENCE OF ULCERS (sore throat, sores in mouth, or a toothache) UNUSUAL RASH, SWELLING OR PAIN  UNUSUAL VAGINAL DISCHARGE OR ITCHING   Items with * indicate a potential emergency and should be followed up as soon as possible or go to the Emergency Department if any problems should occur.  Please show the CHEMOTHERAPY ALERT CARD or IMMUNOTHERAPY ALERT CARD at check-in to the Emergency Department and triage nurse.  Should you have questions after your visit or need to cancel  or reschedule your appointment, please contact [facility] [phone]  and follow the prompts.  Office hours are 8:00 a.m. to 4:30 p.m. Monday - Friday. Please note that voicemails left after 4:00 p.m. may not be returned until the following business day.  We are closed weekends and major holidays. You have access to a nurse at all times for urgent questions. Please call the main number to the clinic [phone] and follow the prompts.  For any non-urgent questions, you may also contact your provider using MyChart. We now offer e-Visits for anyone 18 and older to request care online for non-urgent symptoms. For details visit [email or web address].   Also download the MyChart app! Go to the app store, search "MyChart", open the app, select [facility], and log in with your MyChart username and password.  Due to Covid, a mask is required upon entering the hospital/clinic. If you do not have a mask, one will be given to you upon arrival. For doctor visits, patients may have 1 support person aged 18 or older with them. For treatment visits, patients cannot have anyone with them due to current Covid guidelines and our immunocompromised population.  

## 2021-08-06 NOTE — Progress Notes (Signed)
? ?[facility] ?[address] ?[place], [place] [address] ? ? ?CLINIC:  ?Medical Oncology/Hematology ? ?PCP:  ?Joseph Art, MD ?[address] / [place] [address] ?[phone] ? ? 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 ?PRIOR THERAPY:  ?1. Docetaxel x 14 cycles from 08/11/2015 through 06/12/2016. ?2. Abiraterone from 07/13/2016 through 04/09/2019. ?3. Enzalutamide from 04/12/2019 to 11/03/2019. ? ?NGS Results: Foundation 1 MS--stable, TMB 4 Muts/Mb ? ?CURRENT THERAPY: surveillance ? ?BRIEF ONCOLOGIC HISTORY:  ?Oncology History  ?Prostate cancer metastatic to intrathoracic lymph node ([place])  ?07/24/2015 Initial Diagnosis  ? Prostate cancer metastatic to the left supraclavicular and anterior mediastinal lymph nodes ?  ?08/21/2015 - 06/12/2016 Chemotherapy  ? Docetaxel every 3 weeks for 14 cycles, discontinued as PSA has plateaued around 11 ? ?  ?07/13/2016 Treatment Plan Change  ? Zytiga 1000 milligrams daily along with prednisone 5 mg daily, started secondary to fluid overload from docetaxel ?  ?10/14/2018 Genetic Testing  ? Negative genetic testing.  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  The Common Hereditary Gene Panel offered by [facility] includes sequencing and/or deletion duplication testing of the following 48 genes: APC, ATM, AXIN2, BARD1, BMPR1A, BRCA1, BRCA2, BRIP1, CDH1, CDK4, CDKN2A (p14ARF), CDKN2A (p16INK4a), CHEK2, CTNNA1, DICER1, EPCAM (Deletion/duplication testing only), GREM1 (promoter region deletion/duplication testing only), KIT, MEN1, MLH1, MSH2, MSH3, MSH6, MUTYH, NBN, NF1, NHTL1, PALB2, PDGFRA, PMS2, POLD1, POLE, PTEN, RAD50, RAD51C, RAD51D, RNF43, SDHB, SDHC, SDHD, SMAD4, SMARCA4. STK11, TP53, TSC1, TSC2, and VHL.  The following genes were evaluated for sequence changes only: SDHA and HOXB13 c.251G>A variant only. The report date is Oct 14, 2018. ?  ?09/18/2019 Genetic Testing  ? [facility] CD[facility]  ? ? ?   ?12/12/2019 Genetic Testing  ? [facility] ? ? ? ? ?  ? ? ?CANCER STAGING: ? Cancer Staging  ?No matching staging information was found for the patient. ? ?INTERVAL HISTORY:  ?[name], a 81 y.o. 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 Yaakov was last seen on 02/05/2021.  ? ?Today he reports feeling good. He reports a cough. He denies hematochezia and black stools. He takes iron tablet once daily and denies constipation, and he reports taking taken iron tablets for years.  ? ?REVIEW OF SYSTEMS:  ?Review of Systems  ?Constitutional:  Negative for appetite change and fatigue.  ?Respiratory:  Positive for cough and shortness of breath.   ?Gastrointestinal:  Negative for blood in stool and constipation.  ?All other systems reviewed and are negative. ? ?PAST MEDICAL/SURGICAL HISTORY:  ?Past Medical History:  ?Diagnosis Date  ? COPD (chronic obstructive pulmonary disease) ([place])   ? Diabetes mellitus without complication ([place])   ? Family history of ovarian cancer   ? Family history of prostate cancer   ? Hypertension   ? Prostate cancer ([place])   ? ?Past Surgical History:  ?Procedure Laterality Date  ? GOLD SEED IMPLANT    ? ? ?SOCIAL HISTORY:  ?Social History  ? ?Socioeconomic History  ? Marital status: Widowed  ?  Spouse name: Not on file  ? Number of children: Not on file  ? Years of education: Not on file  ? Highest education level: Not on file  ?Occupational History  ? Not on file  ?Tobacco Use  ? Smoking status: Former  ?  Packs/day: 0.25  ?  Years: 60.00  ?  Pack years: 15.00  ?  Types: Cigarettes  ?  Quit date: 06/25/2014  ?  Years since quitting: 7.1  ? Smokeless tobacco: Never  ? Tobacco comments:  ?  smoked since young age, quit in 2016  ?Substance and Sexual Activity  ? Alcohol use: Not Currently  ? Drug use: No  ? Sexual activity: Not Currently  ?Other Topics Concern  ? Not on file  ?Social History Narrative  ? Not on file  ? ?Social Determinants  of Health  ? ?Financial Resource Strain: Not on file  ?Food Insecurity: Not on file  ?Transportation Needs: Not on file  ?Physical Activity: Not on file  ?Stress: Not on file  ?Social Connections: Not on file  ?Intimate Partner Violence: Not on file  ? ? ?FAMILY HISTORY:  ?Family History  ?Problem Relation Age of Onset  ? Ovarian cancer Mother 76  ? Heart attack Father   ? Prostate cancer Maternal Uncle 76  ? Cancer Cousin   ?     pat cousin with unknown cancer  ? Colon cancer Neg Hx   ? Pancreatic cancer Neg Hx   ? Breast cancer Neg Hx   ? ? ?CURRENT MEDICATIONS:  ?Current Outpatient Medications  ?Medication Sig Dispense Refill  ? ACCU-CHEK GUIDE test strip     ? Accu-Chek Softclix Lancets lancets     ? acetaminophen (TYLENOL) 500 MG tablet Take 500 mg by mouth every 6 (six) hours as needed for moderate pain.    ? albuterol (ACCUNEB) 0.63 MG/3ML nebulizer solution Take 1 ampule by nebulization every 6 (six) hours as needed for wheezing.    ? albuterol (PROVENTIL) (2.5 MG/3ML) 0.083% nebulizer solution INHALE 3 ML IN NEBULIZER BY MOUTH FOUR TIMES A DAY AS NEEDED    ? albuterol (VENTOLIN HFA) 108 (90 Base) MCG/ACT inhaler INHALE 2 PUFFS BY MOUTH EVERY FOUR HOURS AS NEEDED    ? amLODipine (NORVASC) 2.5 MG tablet Take 1 tablet (2.5 mg total) by mouth daily as needed. 30 tablet 0  ? aspirin 81 MG EC tablet Take 1 tablet by mouth daily.    ? aspirin EC 81 MG tablet Take 81 mg by mouth daily.    ? atorvastatin (LIPITOR) 10 MG tablet Take 10 mg by mouth at bedtime.    ? atorvastatin (LIPITOR) 20 MG tablet Take 1 tablet by mouth daily.    ? Blood Glucose Monitoring Suppl (ACCU-CHEK GUIDE) w/Device KIT     ? bumetanide (BUMEX) 1 MG tablet TAKE 1 AND 1/2 TABLETS(1.5 MG) BY MOUTH DAILY 45 tablet 6  ? CALCIUM PO Take 600 mg by mouth daily.     ? cetirizine (ZYRTEC) 10 MG tablet Take 10 mg by mouth daily.    ? cetirizine (ZYRTEC) 10 MG tablet Take 1 tablet by mouth daily.    ? CHERATUSSIN AC 100-10 MG/5ML syrup Take 5 mLs by  mouth daily as needed.   0  ? Cholecalciferol (VITAMIN D-3 PO) Take 1 tablet by mouth daily.    ? cyclobenzaprine (FLEXERIL) 10 MG tablet Take 10 mg by mouth as needed.   0  ? dicyclomine (BENTYL) 10 MG capsule Take 10 mg by mouth daily.    ? dicyclomine (BENTYL) 20 MG tablet Take 1 tablet by mouth 3 (three) times daily as needed.    ? dorzolamide (TRUSOPT) 2 % ophthalmic solution Place 1 drop into both eyes 3 (three) times daily.    ? ferrous sulfate 325 (65 FE) MG tablet Take 325 mg by mouth daily with breakfast.    ?  ferrous sulfate 325 (65 FE) MG tablet Take 1 tablet by mouth daily.    ? fluticasone (FLONASE) 50 MCG/ACT nasal spray USE 2 SPRAY(S) IN EACH NOSTRIL ONCE DAILY FOR 30 DAYS  12  ? guaifenesin (HUMIBID E) 400 MG TABS tablet Take 1 tablet by mouth 4 (four) times daily as needed.    ? ibuprofen (ADVIL,MOTRIN) 600 MG tablet Take 600 mg by mouth every 6 (six) hours as needed.   0  ? Latanoprostene Bunod (VYZULTA) 0.024 % SOLN Apply 1 drop to eye 1 day or 1 dose.    ? Leuprolide Acetate, 4 Month, (ELIGARD) 30 MG injection 30 MG SUBCUTANEOUSLY Q90D PRN    ? levofloxacin (LEVAQUIN) 500 MG tablet TAKE ONE TABLET BY MOUTH DAILY (MONITOR and REPORT ANY SUDDEN ONSET TENDON PAIN)    ? losartan (COZAAR) 100 MG tablet Take 100 mg by mouth daily.    ? losartan (COZAAR) 100 MG tablet Take 1 tablet by mouth daily.    ? magnesium oxide (MAG-OX) 400 MG tablet Take 1 tablet (400 mg total) by mouth every other day. 90 tablet 2  ? Magnesium Oxide 420 MG TABS Take 1 tablet by mouth daily.    ? MAGNESIUM-OXIDE 400 (240 Mg) MG tablet TAKE 1 TABLET BY MOUTH EVERY OTHER DAY 90 tablet 2  ? metFORMIN (GLUCOPHAGE) 500 MG tablet Take 1,000 mg by mouth 2 (two) times daily with a meal.   3  ? metFORMIN (GLUCOPHAGE-XR) 500 MG 24 hr tablet TAKE TWO TABLETS BY MOUTH TWICE A DAY FOR DIABETES (ANNUAL KIDNEY FUNCTION TESTING IS NEEDED)    ? montelukast (SINGULAIR) 10 MG tablet Take 10 mg by mouth at bedtime.    ? montelukast (SINGULAIR) 10  MG tablet Take 1 tablet by mouth daily.    ? potassium chloride (MICRO-K) 10 MEQ CR capsule TAKE 4 CAPSULES BY MOUTH WITH FOOD. OPEN AND SPRINKLE ON FOOD 120 capsule 3  ? potassium chloride (MICRO-K) 10 MEQ CR cap

## 2021-08-11 ENCOUNTER — Inpatient Hospital Stay (HOSPITAL_COMMUNITY): Payer: Medicare PPO

## 2021-08-11 ENCOUNTER — Other Ambulatory Visit: Payer: Self-pay

## 2021-08-11 ENCOUNTER — Other Ambulatory Visit (HOSPITAL_COMMUNITY): Payer: Self-pay | Admitting: *Deleted

## 2021-08-11 VITALS — BP 159/90 | HR 65 | Temp 96.2°F | Resp 18

## 2021-08-11 DIAGNOSIS — C61 Malignant neoplasm of prostate: Secondary | ICD-10-CM | POA: Diagnosis not present

## 2021-08-11 MED ORDER — SODIUM CHLORIDE 0.9 % IV SOLN: Freq: Once | INTRAVENOUS | Status: AC

## 2021-08-11 MED ORDER — SODIUM CHLORIDE 0.9 % IV SOLN: 510.0000 mg | Freq: Once | INTRAVENOUS | Status: DC

## 2021-08-11 MED ORDER — FLUTICASONE PROPIONATE 50 MCG/ACT NA SUSP: NASAL | 12 refills | Status: DC

## 2021-08-11 MED ORDER — LORATADINE 10 MG PO TABS
10.0000 mg | ORAL_TABLET | Freq: Once | ORAL | Status: AC
  Administered 2021-08-11: 10 mg via ORAL
  Filled 2021-08-11: qty 1

## 2021-08-11 MED ORDER — ACETAMINOPHEN 325 MG PO TABS
650.0000 mg | ORAL_TABLET | Freq: Once | ORAL | Status: AC
  Administered 2021-08-11: 650 mg via ORAL
  Filled 2021-08-11: qty 2

## 2021-08-11 MED ORDER — SODIUM CHLORIDE 0.9% FLUSH
10.0000 mL | INTRAVENOUS | Status: DC | PRN
  Administered 2021-08-11: 10 mL via INTRAVENOUS

## 2021-08-11 MED ORDER — HEPARIN SOD (PORK) LOCK FLUSH 100 UNIT/ML IV SOLN
500.0000 [IU] | Freq: Once | INTRAVENOUS | Status: AC
  Administered 2021-08-11: 500 [IU] via INTRAVENOUS

## 2021-08-11 MED ORDER — SODIUM CHLORIDE 0.9 % IV SOLN
300.0000 mg | Freq: Once | INTRAVENOUS | Status: AC
  Administered 2021-08-11: 300 mg via INTRAVENOUS
  Filled 2021-08-11: qty 15

## 2021-08-11 NOTE — Progress Notes (Signed)
Patient presents today for iron infusion.  Patient is in satisfactory condition with no complaints voiced.  Vital signs are stable.  We will proceed with treatment per MD orders.  ? ?Patient tolerated treatment well with no complaints voiced.  Patient left with daughter via wheelchair in stable condition.  Vital signs stable at discharge.  Follow up as scheduled.    ?

## 2021-08-11 NOTE — Patient Instructions (Signed)
[facility]  Discharge Instructions: Thank you for choosing [facility] to provide your oncology and hematology care.  If you have a lab appointment with the Cancer Center, please come in thru the Main Entrance and check in at the main information desk.  Wear comfortable clothing and clothing appropriate for easy access to any Portacath or PICC line.   We strive to give you quality time with your provider. You may need to reschedule your appointment if you arrive late (15 or more minutes).  Arriving late affects you and other patients whose appointments are after yours.  Also, if you miss three or more appointments without notifying the office, you may be dismissed from the clinic at the provider's discretion.      For prescription refill requests, have your pharmacy contact our office and allow 72 hours for refills to be completed.        To help prevent nausea and vomiting after your treatment, we encourage you to take your nausea medication as directed.  BELOW ARE SYMPTOMS THAT SHOULD BE REPORTED IMMEDIATELY: *FEVER GREATER THAN 100.4 F (38 C) OR HIGHER *CHILLS OR SWEATING *NAUSEA AND VOMITING THAT IS NOT CONTROLLED WITH YOUR NAUSEA MEDICATION *UNUSUAL SHORTNESS OF BREATH *UNUSUAL BRUISING OR BLEEDING *URINARY PROBLEMS (pain or burning when urinating, or frequent urination) *BOWEL PROBLEMS (unusual diarrhea, constipation, pain near the anus) TENDERNESS IN MOUTH AND THROAT WITH OR WITHOUT PRESENCE OF ULCERS (sore throat, sores in mouth, or a toothache) UNUSUAL RASH, SWELLING OR PAIN  UNUSUAL VAGINAL DISCHARGE OR ITCHING   Items with * indicate a potential emergency and should be followed up as soon as possible or go to the Emergency Department if any problems should occur.  Please show the CHEMOTHERAPY ALERT CARD or IMMUNOTHERAPY ALERT CARD at check-in to the Emergency Department and triage nurse.  Should you have questions after your visit or need to cancel  or reschedule your appointment, please contact [facility] [phone]  and follow the prompts.  Office hours are 8:00 a.m. to 4:30 p.m. Monday - Friday. Please note that voicemails left after 4:00 p.m. may not be returned until the following business day.  We are closed weekends and major holidays. You have access to a nurse at all times for urgent questions. Please call the main number to the clinic [phone] and follow the prompts.  For any non-urgent questions, you may also contact your provider using MyChart. We now offer e-Visits for anyone 18 and older to request care online for non-urgent symptoms. For details visit [email or web address].   Also download the MyChart app! Go to the app store, search "MyChart", open the app, select [facility], and log in with your MyChart username and password.  Due to Covid, a mask is required upon entering the hospital/clinic. If you do not have a mask, one will be given to you upon arrival. For doctor visits, patients may have 1 support person aged 18 or older with them. For treatment visits, patients cannot have anyone with them due to current Covid guidelines and our immunocompromised population.  

## 2021-08-18 ENCOUNTER — Inpatient Hospital Stay (HOSPITAL_COMMUNITY): Payer: Medicare PPO

## 2021-08-18 ENCOUNTER — Encounter (HOSPITAL_COMMUNITY): Payer: Self-pay

## 2021-08-18 ENCOUNTER — Other Ambulatory Visit: Payer: Self-pay

## 2021-08-18 VITALS — BP 124/72 | HR 70 | Temp 96.8°F | Resp 18

## 2021-08-18 DIAGNOSIS — C61 Malignant neoplasm of prostate: Secondary | ICD-10-CM

## 2021-08-18 MED ORDER — ACETAMINOPHEN 325 MG PO TABS
650.0000 mg | ORAL_TABLET | Freq: Once | ORAL | Status: AC
  Administered 2021-08-18: 650 mg via ORAL
  Filled 2021-08-18: qty 2

## 2021-08-18 MED ORDER — SODIUM CHLORIDE 0.9 % IV SOLN
300.0000 mg | Freq: Once | INTRAVENOUS | Status: AC
  Administered 2021-08-18: 300 mg via INTRAVENOUS
  Filled 2021-08-18: qty 300

## 2021-08-18 MED ORDER — SODIUM CHLORIDE 0.9% FLUSH
10.0000 mL | Freq: Once | INTRAVENOUS | Status: AC
  Administered 2021-08-18: 10 mL via INTRAVENOUS

## 2021-08-18 MED ORDER — SODIUM CHLORIDE 0.9 % IV SOLN: Freq: Once | INTRAVENOUS | Status: AC

## 2021-08-18 MED ORDER — HEPARIN SOD (PORK) LOCK FLUSH 100 UNIT/ML IV SOLN
500.0000 [IU] | Freq: Once | INTRAVENOUS | Status: AC
  Administered 2021-08-18: 500 [IU] via INTRAVENOUS

## 2021-08-18 MED ORDER — LORATADINE 10 MG PO TABS
10.0000 mg | ORAL_TABLET | Freq: Once | ORAL | Status: AC
  Administered 2021-08-18: 10 mg via ORAL
  Filled 2021-08-18: qty 1

## 2021-08-18 NOTE — Progress Notes (Signed)
Iron infusion given per orders. Patient tolerated it well without problems. Vitals stable and discharged home from clinic ambulatory. Follow up as scheduled.  

## 2021-08-18 NOTE — Patient Instructions (Signed)
[facility]  Discharge Instructions: ?Thank you for choosing [facility] to provide your oncology and hematology care.  ?If you have a lab appointment with the Cancer Center, please come in thru the Main Entrance and check in at the main information desk. ? ? ? ?We strive to give you quality time with your provider. You may need to reschedule your appointment if you arrive late (15 or more minutes).  Arriving late affects you and other patients whose appointments are after yours.  Also, if you miss three or more appointments without notifying the office, you may be dismissed from the clinic at the provider?s discretion.    ?  ?For prescription refill requests, have your pharmacy contact our office and allow 72 hours for refills to be completed.   ? ?  ?To help prevent nausea and vomiting after your treatment, we encourage you to take your nausea medication as directed. ? ?BELOW ARE SYMPTOMS THAT SHOULD BE REPORTED IMMEDIATELY: ?*FEVER GREATER THAN 100.4 F (38 ?C) OR HIGHER ?*CHILLS OR SWEATING ?*NAUSEA AND VOMITING THAT IS NOT CONTROLLED WITH YOUR NAUSEA MEDICATION ?*UNUSUAL SHORTNESS OF BREATH ?*UNUSUAL BRUISING OR BLEEDING ?*URINARY PROBLEMS (pain or burning when urinating, or frequent urination) ?*BOWEL PROBLEMS (unusual diarrhea, constipation, pain near the anus) ?TENDERNESS IN MOUTH AND THROAT WITH OR WITHOUT PRESENCE OF ULCERS (sore throat, sores in mouth, or a toothache) ?UNUSUAL RASH, SWELLING OR PAIN  ?UNUSUAL VAGINAL DISCHARGE OR ITCHING  ? ?Items with * indicate a potential emergency and should be followed up as soon as possible or go to the Emergency Department if any problems should occur. ? ?Please show the CHEMOTHERAPY ALERT CARD or IMMUNOTHERAPY ALERT CARD at check-in to the Emergency Department and triage nurse. ? ?Should you have questions after your visit or need to cancel or reschedule your appointment, please contact  CANCER CENTER [phone]  and follow  the prompts.  Office hours are 8:00 a.m. to 4:30 p.m. Monday - Friday. Please note that voicemails left after 4:00 p.m. may not be returned until the following business day.  We are closed weekends and major holidays. You have access to a nurse at all times for urgent questions. Please call the main number to the clinic [phone] and follow the prompts. ? ?For any non-urgent questions, you may also contact your provider using MyChart. We now offer e-Visits for anyone 18 and older to request care online for non-urgent symptoms. For details visit [email or web address]. ?  ?Also download the MyChart app! Go to the app store, search "MyChart", open the app, select [facility], and log in with your MyChart username and password. ? ?Due to Covid, a mask is required upon entering the hospital/clinic. If you do not have a mask, one will be given to you upon arrival. For doctor visits, patients may have 1 support person aged 18 or older with them. For treatment visits, patients cannot have anyone with them due to current Covid guidelines and our immunocompromised population.  ?

## 2021-08-25 ENCOUNTER — Inpatient Hospital Stay (HOSPITAL_COMMUNITY): Payer: Medicare PPO | Attending: Hematology

## 2021-08-25 VITALS — BP 124/72 | HR 69 | Temp 97.9°F | Resp 18 | Wt 244.4 lb

## 2021-08-25 DIAGNOSIS — C61 Malignant neoplasm of prostate: Secondary | ICD-10-CM | POA: Insufficient documentation

## 2021-08-25 DIAGNOSIS — C771 Secondary and unspecified malignant neoplasm of intrathoracic lymph nodes: Secondary | ICD-10-CM | POA: Diagnosis present

## 2021-08-25 MED ORDER — HEPARIN SOD (PORK) LOCK FLUSH 100 UNIT/ML IV SOLN
500.0000 [IU] | Freq: Once | INTRAVENOUS | Status: AC
Start: 2021-08-25 — End: 2021-08-25
  Administered 2021-08-25: 500 [IU] via INTRAVENOUS

## 2021-08-25 MED ORDER — SODIUM CHLORIDE 0.9 % IV SOLN
400.0000 mg | Freq: Once | INTRAVENOUS | Status: AC
  Administered 2021-08-25: 400 mg via INTRAVENOUS
  Filled 2021-08-25: qty 20

## 2021-08-25 MED ORDER — SODIUM CHLORIDE 0.9 % IV SOLN: Freq: Once | INTRAVENOUS | Status: AC

## 2021-08-25 MED ORDER — ACETAMINOPHEN 325 MG PO TABS
650.0000 mg | ORAL_TABLET | Freq: Once | ORAL | Status: AC
  Administered 2021-08-25: 650 mg via ORAL
  Filled 2021-08-25: qty 2

## 2021-08-25 MED ORDER — LORATADINE 10 MG PO TABS
10.0000 mg | ORAL_TABLET | Freq: Once | ORAL | Status: AC
  Administered 2021-08-25: 10 mg via ORAL
  Filled 2021-08-25: qty 1

## 2021-08-25 NOTE — Progress Notes (Signed)
Patient presents today for Venofer infusion per providers order.  Vital signs WNL.  Patient has no new complaints at this time. ? ?Venofer given today per MD orders.  Stable during infusion without adverse affects.  Vital signs stable.  No complaints at this time.  Discharge from clinic via wheelchair in stable condition.  Alert and oriented X 3.  Follow up with [facility] as scheduled.  ?

## 2021-08-25 NOTE — Patient Instructions (Signed)
[facility]  Discharge Instructions: Thank you for choosing [facility] to provide your oncology and hematology care.  If you have a lab appointment with the Cancer Center, please come in thru the Main Entrance and check in at the main information desk.  Wear comfortable clothing and clothing appropriate for easy access to any Portacath or PICC line.   We strive to give you quality time with your provider. You may need to reschedule your appointment if you arrive late (15 or more minutes).  Arriving late affects you and other patients whose appointments are after yours.  Also, if you miss three or more appointments without notifying the office, you may be dismissed from the clinic at the provider's discretion.      For prescription refill requests, have your pharmacy contact our office and allow 72 hours for refills to be completed.    Today you received the following chemotherapy and/or immunotherapy agents Venofer      To help prevent nausea and vomiting after your treatment, we encourage you to take your nausea medication as directed.  BELOW ARE SYMPTOMS THAT SHOULD BE REPORTED IMMEDIATELY: *FEVER GREATER THAN 100.4 F (38 C) OR HIGHER *CHILLS OR SWEATING *NAUSEA AND VOMITING THAT IS NOT CONTROLLED WITH YOUR NAUSEA MEDICATION *UNUSUAL SHORTNESS OF BREATH *UNUSUAL BRUISING OR BLEEDING *URINARY PROBLEMS (pain or burning when urinating, or frequent urination) *BOWEL PROBLEMS (unusual diarrhea, constipation, pain near the anus) TENDERNESS IN MOUTH AND THROAT WITH OR WITHOUT PRESENCE OF ULCERS (sore throat, sores in mouth, or a toothache) UNUSUAL RASH, SWELLING OR PAIN  UNUSUAL VAGINAL DISCHARGE OR ITCHING   Items with * indicate a potential emergency and should be followed up as soon as possible or go to the Emergency Department if any problems should occur.  Please show the CHEMOTHERAPY ALERT CARD or IMMUNOTHERAPY ALERT CARD at check-in to the Emergency  Department and triage nurse.  Should you have questions after your visit or need to cancel or reschedule your appointment, please contact [facility] [phone]  and follow the prompts.  Office hours are 8:00 a.m. to 4:30 p.m. Monday - Friday. Please note that voicemails left after 4:00 p.m. may not be returned until the following business day.  We are closed weekends and major holidays. You have access to a nurse at all times for urgent questions. Please call the main number to the clinic [phone] and follow the prompts.  For any non-urgent questions, you may also contact your provider using MyChart. We now offer e-Visits for anyone 18 and older to request care online for non-urgent symptoms. For details visit [email or web address].   Also download the MyChart app! Go to the app store, search "MyChart", open the app, select [facility], and log in with your MyChart username and password.  Due to Covid, a mask is required upon entering the hospital/clinic. If you do not have a mask, one will be given to you upon arrival. For doctor visits, patients may have 1 support person aged 18 or older with them. For treatment visits, patients cannot have anyone with them due to current Covid guidelines and our immunocompromised population.  

## 2021-10-14 ENCOUNTER — Other Ambulatory Visit (HOSPITAL_COMMUNITY): Payer: Self-pay | Admitting: *Deleted

## 2021-10-14 DIAGNOSIS — D500 Iron deficiency anemia secondary to blood loss (chronic): Secondary | ICD-10-CM

## 2021-10-14 DIAGNOSIS — C771 Secondary and unspecified malignant neoplasm of intrathoracic lymph nodes: Secondary | ICD-10-CM

## 2021-10-14 DIAGNOSIS — C61 Malignant neoplasm of prostate: Secondary | ICD-10-CM

## 2021-10-15 ENCOUNTER — Inpatient Hospital Stay (HOSPITAL_COMMUNITY): Payer: Medicare PPO | Attending: Hematology

## 2021-10-15 ENCOUNTER — Inpatient Hospital Stay (HOSPITAL_BASED_OUTPATIENT_CLINIC_OR_DEPARTMENT_OTHER): Payer: Medicare PPO | Admitting: Hematology

## 2021-10-15 ENCOUNTER — Encounter (HOSPITAL_COMMUNITY): Payer: Self-pay | Admitting: Hematology

## 2021-10-15 VITALS — BP 138/62 | HR 71 | Temp 97.5°F | Resp 18 | Ht 69.0 in | Wt 240.9 lb

## 2021-10-15 DIAGNOSIS — Z87891 Personal history of nicotine dependence: Secondary | ICD-10-CM | POA: Insufficient documentation

## 2021-10-15 DIAGNOSIS — C61 Malignant neoplasm of prostate: Secondary | ICD-10-CM

## 2021-10-15 DIAGNOSIS — Z79899 Other long term (current) drug therapy: Secondary | ICD-10-CM | POA: Diagnosis not present

## 2021-10-15 DIAGNOSIS — G629 Polyneuropathy, unspecified: Secondary | ICD-10-CM | POA: Diagnosis not present

## 2021-10-15 DIAGNOSIS — D649 Anemia, unspecified: Secondary | ICD-10-CM | POA: Insufficient documentation

## 2021-10-15 DIAGNOSIS — C771 Secondary and unspecified malignant neoplasm of intrathoracic lymph nodes: Secondary | ICD-10-CM

## 2021-10-15 DIAGNOSIS — M7989 Other specified soft tissue disorders: Secondary | ICD-10-CM | POA: Diagnosis not present

## 2021-10-15 DIAGNOSIS — Z9221 Personal history of antineoplastic chemotherapy: Secondary | ICD-10-CM | POA: Insufficient documentation

## 2021-10-15 DIAGNOSIS — D500 Iron deficiency anemia secondary to blood loss (chronic): Secondary | ICD-10-CM

## 2021-10-15 LAB — COMPREHENSIVE METABOLIC PANEL
ALT: 18 U/L (ref 0–44)
AST: 20 U/L (ref 15–41)
Albumin: 3.5 g/dL (ref 3.5–5.0)
Alkaline Phosphatase: 54 U/L (ref 38–126)
Anion gap: 4 — ABNORMAL LOW (ref 5–15)
BUN: 11 mg/dL (ref 8–23)
CO2: 28 mmol/L (ref 22–32)
Calcium: 9.2 mg/dL (ref 8.9–10.3)
Chloride: 105 mmol/L (ref 98–111)
Creatinine, Ser: 1.01 mg/dL (ref 0.61–1.24)
GFR, Estimated: >60 mL/min (ref >60)
Glucose, Bld: 143 mg/dL — ABNORMAL HIGH (ref 70–99)
Potassium: 4.1 mmol/L (ref 3.5–5.1)
Sodium: 137 mmol/L (ref 135–145)
Total Bilirubin: 0.9 mg/dL (ref 0.3–1.2)
Total Protein: 6.6 g/dL (ref 6.5–8.1)

## 2021-10-15 LAB — CBC WITH DIFFERENTIAL/PLATELET
Abs Immature Granulocytes: 0.03 10*3/uL (ref 0.00–0.07)
Basophils Absolute: 0.1 10*3/uL (ref 0.0–0.1)
Basophils Relative: 1 %
Eosinophils Absolute: 0.2 10*3/uL (ref 0.0–0.5)
Eosinophils Relative: 2 %
HCT: 33.8 % — ABNORMAL LOW (ref 39.0–52.0)
Hemoglobin: 10.7 g/dL — ABNORMAL LOW (ref 13.0–17.0)
Immature Granulocytes: 0 %
Lymphocytes Relative: 10 %
Lymphs Abs: 1 10*3/uL (ref 0.7–4.0)
MCH: 27 pg (ref 26.0–34.0)
MCHC: 31.7 g/dL (ref 30.0–36.0)
MCV: 85.4 fL (ref 80.0–100.0)
Monocytes Absolute: 0.9 10*3/uL (ref 0.1–1.0)
Monocytes Relative: 9 %
Neutro Abs: 8 10*3/uL — ABNORMAL HIGH (ref 1.7–7.7)
Neutrophils Relative %: 78 %
Platelets: 345 10*3/uL (ref 150–400)
RBC: 3.96 MIL/uL — ABNORMAL LOW (ref 4.22–5.81)
RDW: 15.8 % — ABNORMAL HIGH (ref 11.5–15.5)
WBC: 10.2 10*3/uL (ref 4.0–10.5)
nRBC: 0 % (ref 0.0–0.2)

## 2021-10-15 LAB — IRON AND TIBC
Iron: 61 ug/dL (ref 45–182)
Saturation Ratios: 23 % (ref 17.9–39.5)
TIBC: 268 ug/dL (ref 250–450)
UIBC: 207 ug/dL

## 2021-10-15 LAB — FERRITIN: Ferritin: 330 ng/mL (ref 24–336)

## 2021-10-15 NOTE — Progress Notes (Signed)
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 Docetaxel x 14 cycles from 08/11/2015 through 06/12/2016. 2. Abiraterone from 07/13/2016 through 04/09/2019. 3. Enzalutamide from 04/12/2019 to 11/03/2019.  NGS Results: Foundation 1 MS--stable, TMB 4 Muts/Mb  CURRENT THERAPY: surveillance  BRIEF ONCOLOGIC HISTORY:  Oncology History  Prostate cancer metastatic to intrathoracic lymph node ([place])  07/24/2015 Initial Diagnosis   Prostate cancer metastatic to the left supraclavicular and anterior mediastinal lymph nodes    08/21/2015 - 06/12/2016 Chemotherapy   Docetaxel every 3 weeks for 14 cycles, discontinued as PSA has plateaued around 11     07/13/2016 Treatment Plan Change   Zytiga 1000 milligrams daily along with prednisone 5 mg daily, started secondary to fluid overload from docetaxel    10/14/2018 Genetic Testing   Negative genetic testing.  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  The Common Hereditary Gene Panel offered by [facility] includes sequencing and/or deletion duplication testing of the following 48 genes: APC, ATM, AXIN2, BARD1, BMPR1A, BRCA1, BRCA2, BRIP1, CDH1, CDK4, CDKN2A (p14ARF), CDKN2A (p16INK4a), CHEK2, CTNNA1, DICER1, EPCAM (Deletion/duplication testing only), GREM1 (promoter region deletion/duplication testing only), KIT, MEN1, MLH1, MSH2, MSH3, MSH6, MUTYH, NBN, NF1, NHTL1, PALB2, PDGFRA, PMS2, POLD1, POLE, PTEN, RAD50, RAD51C, RAD51D, RNF43, SDHB, SDHC, SDHD, SMAD4, SMARCA4. STK11, TP53, TSC1, TSC2, and VHL.  The following genes were evaluated for sequence changes only: SDHA and HOXB13 c.251G>A variant only. The report date is Oct 14, 2018.    09/18/2019 Genetic Testing   Foundation One CDx       12/12/2019 Genetic Testing   Guardant 360         CANCER STAGING:  Cancer Staging  No matching staging information was found for the patient.  INTERVAL HISTORY:  Andre [name], a 81 y.o. 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 Andre [name] was last seen on 08/06/2021.  Today he reports feeling good, and he is accompanied by his daughter. He reports cough. He denies current bleeding and black stools. He continues to take 1 tablet of bumetanide daily. He denies new pains. He reports swelling in his feet which has not been helped by compression stockings.   REVIEW OF SYSTEMS:  Review of Systems  Constitutional:  Negative for appetite change and fatigue.  HENT:   Negative for nosebleeds.   Respiratory:  Positive for cough and shortness of breath. Negative for hemoptysis.   Cardiovascular:  Positive for leg swelling (feet).  Gastrointestinal:  Negative for blood in stool.  Genitourinary:  Negative for hematuria.   All other systems reviewed and are negative.  PAST MEDICAL/SURGICAL HISTORY:  Past Medical History:  Diagnosis Date   COPD (chronic obstructive pulmonary disease) ([facility])    Diabetes mellitus without complication ([facility])    Family history of ovarian cancer    Family history of prostate cancer    Hypertension    Prostate cancer ([place])    Past Surgical History:  Procedure Laterality Date   GOLD SEED IMPLANT      SOCIAL HISTORY:  Social History   Socioeconomic History   Marital status: Widowed    Spouse name: Not on file   Number of children: Not on file   Years of education: Not on file  Highest education level: Not on file  Occupational History   Not on file  Tobacco Use   Smoking status: Former    Packs/day: 0.25    Years: 60.00    Pack years: 15.00    Types: Cigarettes    Quit date: 06/25/2014    Years since quitting: 7.3   Smokeless tobacco: Never   Tobacco comments:    smoked since young  age, quit in 2016  Substance and Sexual Activity   Alcohol use: Not Currently   Drug use: No   Sexual activity: Not Currently  Other Topics Concern   Not on file  Social History Narrative   Not on file   Social Determinants of Health   Financial Resource Strain: Not on file  Food Insecurity: Not on file  Transportation Needs: Not on file  Physical Activity: Not on file  Stress: Not on file  Social Connections: Not on file  Intimate Partner Violence: Not on file    FAMILY HISTORY:  Family History  Problem Relation Age of Onset   Ovarian cancer Mother 48   Heart attack Father    Prostate cancer Maternal Uncle 62   Cancer Cousin        pat cousin with unknown cancer   Colon cancer Neg Hx    Pancreatic cancer Neg Hx    Breast cancer Neg Hx     CURRENT MEDICATIONS:  Current Outpatient Medications  Medication Sig Dispense Refill   ACCU-CHEK GUIDE test strip      Accu-Chek Softclix Lancets lancets      acetaminophen (TYLENOL) 500 MG tablet Take 500 mg by mouth every 6 (six) hours as needed for moderate pain.     albuterol (ACCUNEB) 0.63 MG/3ML nebulizer solution Take 1 ampule by nebulization every 6 (six) hours as needed for wheezing.     albuterol (PROVENTIL) (2.5 MG/3ML) 0.083% nebulizer solution INHALE 3 ML IN NEBULIZER BY MOUTH FOUR TIMES A DAY AS NEEDED     amLODipine (NORVASC) 2.5 MG tablet Take 1 tablet (2.5 mg total) by mouth daily as needed. 30 tablet 0   ascorbic acid (VITAMIN C) 500 MG tablet Take 1 tablet by mouth daily.     azelastine (ASTELIN) 0.1 % nasal spray USE 2 SPRAYS IN EACH NOSTRIL TWICE A DAY FOR ALLERGIC RHINITIS     guaifenesin (HUMIBID E) 400 MG TABS tablet Take 1 tablet by mouth 4 (four) times daily as needed.     ibuprofen (ADVIL,MOTRIN) 600 MG tablet Take 600 mg by mouth every 6 (six) hours as needed.   0   Ipratropium-Albuterol (COMBIVENT) 20-100 MCG/ACT AERS respimat INHALE 1 PUFF BY MOUTH FOUR TIMES A DAY AS NEEDED COPD     Latanoprostene Bunod  (VYZULTA) 0.024 % SOLN Apply 1 drop to eye 1 day or 1 dose.     Leuprolide Acetate, 4 Month, (ELIGARD) 30 MG injection 30 MG SUBCUTANEOUSLY Q90D PRN     losartan (COZAAR) 100 MG tablet Take 1 tablet by mouth daily.     magnesium oxide (MAG-OX) 400 MG tablet Take 1 tablet (400 mg total) by mouth every other day. 90 tablet 2   prochlorperazine (COMPAZINE) 10 MG tablet Take 1 tablet (10 mg total) by mouth every 8 (eight) hours as needed for nausea or vomiting. 30 tablet 3   TRELEGY ELLIPTA 100-62.5-25 MCG/INH AEPB INHALE 1 PUFF ONCE DAILY     vitamin C (ASCORBIC ACID) 500 MG tablet Take 500 mg by mouth daily.     aspirin  81 MG EC tablet Take 1 tablet by mouth daily.     atorvastatin (LIPITOR) 20 MG tablet Take 1 tablet by mouth daily. 70m     benzonatate (TESSALON) 200 MG capsule Take by mouth.     Blood Glucose Monitoring Suppl (ACCU-CHEK GUIDE) w/Device KIT      bumetanide (BUMEX) 1 MG tablet TAKE 1 AND 1/2 TABLETS(1.5 MG) BY MOUTH DAILY 45 tablet 6   CALCIUM PO Take 600 mg by mouth daily.      cetirizine (ZYRTEC) 10 MG tablet Take 10 mg by mouth daily.     CHERATUSSIN AC 100-10 MG/5ML syrup Take 5 mLs by mouth daily as needed.   0   Cholecalciferol (VITAMIN D-3 PO) Take 1 tablet by mouth daily.     cyclobenzaprine (FLEXERIL) 10 MG tablet Take 10 mg by mouth as needed.   0   dicyclomine (BENTYL) 20 MG tablet Take 1 tablet by mouth 3 (three) times daily as needed.     dorzolamide (TRUSOPT) 2 % ophthalmic solution Place 1 drop into both eyes 3 (three) times daily.     ferrous sulfate 325 (65 FE) MG tablet Take 1 tablet by mouth daily. (Patient not taking: Reported on 10/15/2021)     fluticasone (FLONASE) 50 MCG/ACT nasal spray USE 2 SPRAY(S) IN EACH NOSTRIL ONCE DAILY FOR 30 DAYS (Patient not taking: Reported on 10/15/2021) 15.8 mL 12   MAGNESIUM-OXIDE 400 (240 Mg) MG tablet TAKE 1 TABLET BY MOUTH EVERY OTHER DAY 90 tablet 2   metFORMIN (GLUCOPHAGE) 500 MG tablet Take 1,000 mg by mouth 2 (two)  times daily with a meal.   3   montelukast (SINGULAIR) 10 MG tablet Take 10 mg by mouth at bedtime.     potassium chloride (MICRO-K) 10 MEQ CR capsule TAKE 4 CAPSULES BY MOUTH WITH FOOD. OPEN AND SPRINKLE ON FOOD 120 capsule 3   No current facility-administered medications for this visit.    ALLERGIES:  Allergies  Allergen Reactions   Lisinopril Cough   Metoprolol Other (See Comments) and Cough    Increases frequency of cough Other reaction(s): Other (See Comments) Increases frequency of cough    PHYSICAL EXAM:  Performance status (ECOG): 1 - Symptomatic but completely ambulatory  Vitals:   10/15/21 1439  BP: 138/62  Pulse: 71  Resp: 18  Temp: (!) 97.5 F (36.4 C)  SpO2: 96%   Wt Readings from Last 3 Encounters:  10/15/21 240 lb 14.4 oz (109.3 kg)  08/25/21 244 lb 6.4 oz (110.9 kg)  08/06/21 245 lb 9 oz (111.4 kg)   Physical Exam Vitals reviewed.  Constitutional:      Appearance: Normal appearance. He is obese.  Cardiovascular:     Rate and Rhythm: Normal rate and regular rhythm.     Pulses: Normal pulses.     Heart sounds: Normal heart sounds.  Pulmonary:     Effort: Pulmonary effort is normal.     Breath sounds: Normal breath sounds.  Lymphadenopathy:     Cervical:     Right cervical: No superficial, deep or posterior cervical adenopathy.    Left cervical: No superficial, deep or posterior cervical adenopathy.     Upper Body:     Right upper body: No supraclavicular or axillary adenopathy.     Left upper body: No supraclavicular or axillary adenopathy.  Neurological:     General: No focal deficit present.     Mental Status: He is alert and oriented to person, place, and time.  Psychiatric:  Mood and Affect: Mood normal.        Behavior: Behavior normal.     LABORATORY DATA:  I have reviewed the labs as listed.     Latest Ref Rng & Units 10/15/2021    1:44 PM 08/06/2021    1:48 PM 02/05/2021    1:54 PM  CBC  WBC 4.0 - 10.5 K/uL 10.2   8.8   8.0     Hemoglobin 13.0 - 17.0 g/dL 10.7   10.3   11.5    Hematocrit 39.0 - 52.0 % 33.8   34.2   37.1    Platelets 150 - 400 K/uL 345   359   346        Latest Ref Rng & Units 10/15/2021    1:44 PM 08/06/2021    1:48 PM 02/05/2021    1:54 PM  CMP  Glucose 70 - 99 mg/dL 143   155   133    BUN 8 - 23 mg/dL 11   14   16     Creatinine 0.61 - 1.24 mg/dL 1.01   0.88   0.88    Sodium 135 - 145 mmol/L 137   137   140    Potassium 3.5 - 5.1 mmol/L 4.1   3.9   3.8    Chloride 98 - 111 mmol/L 105   106   105    CO2 22 - 32 mmol/L 28   25   26     Calcium 8.9 - 10.3 mg/dL 9.2   9.1   9.2    Total Protein 6.5 - 8.1 g/dL 6.6   6.5   6.6    Total Bilirubin 0.3 - 1.2 mg/dL 0.9   0.5   0.5    Alkaline Phos 38 - 126 U/L 54   56   52    AST 15 - 41 U/L 20   14   17     ALT 0 - 44 U/L 18   12   12       DIAGNOSTIC IMAGING:  I have independently reviewed the scans and discussed with the patient. No results found.   ASSESSMENT:  1.  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 -Abiraterone and prednisone from 07/13/2016 through 04/09/2019, discontinued secondary to PSA progression. -Enzalutamide from 04/12/2019 through 11/03/2019 with progression. -Last Lupron on 06/09/2019. -CT CAP on 11/10/2019 shows no findings of active malignancy.  Stable small sclerotic lesions at T4 and T10, nonspecific.  Right hydronephrosis with right proximal hydroureter extending down to a 0.8 x 0.8 x 0.4 cm proximal ureteral stone at L3 vertical level.  Nonobstructive right and possibly left nephrolithiasis. -Bone scan on 11/10/2019 shows right proximal tibial metaphyseal activity from recent trauma.  Degenerative findings in the spine, right sternoclavicular joint, shoulders and right foot.  No evidence of metastatic disease. -Foundation 1 test shows MS-stable.  AR amplification.  Sensitivity is  reduced due to sample quality. -Guardant 360 on 12/12/2019 MSI high not detected.  TMB was not evaluable.  No other targetable mutations. -F-18-PYLARIFY PET scan showed intense radiotracer activity in the left supraclavicular and prevascular lymph nodes, measuring 10 mm.  Cluster of small prevascular lymph nodes measures 5-7 mm each with SUV 54.  AP window lymph node measuring 8 mm.  No activity in the prostate gland or abdominal or pelvic lymph nodes.  No skeletal activity. - SBRT to  the left supraclavicular lymph node and prevascular mediastinal lymph node from 08/27/2020 through 09/06/2020, 40 Gray in 5 fractions.   2.  Androgen deprivation therapy induced bone loss: -Received Zometa every 3 months for a year completed on 10/29/2017.   3.  Genetic testing: -Germline mutation testing was negative.   PLAN:  1.  Metastatic prostate cancer to the left supraclavicular and mediastinal lymph nodes: - He does not report any new onset pains. - Recently had respiratory infection with RSV positive on 10/01/2021.  He has some residual cough from it. - Reviewed labs from today which shows normal LFTs, creatinine and calcium.  Last PSA was 8.6 on 08/06/2021, up from 1.49 on 02/05/2021.  PSA level from today is pending. - As his PSA levels are rising, recommend scans.  He is already scheduled for a CT chest, abdomen and pelvis scan by Dr. Marjory Lies at the [facility] in [place].  We will obtain images on disc.  I will order a bone scan of the whole body. - We will see him back after the scans to discuss further options which include Pluvicto and chemotherapy in the form of rechallenge with docetaxel or cabazitaxel.   2.  Androgen deprivation therapy induced bone loss: - Continue calcium and vitamin D supplements.  We will consider repeating bone density test in the future.   3.  Leg swelling: - He is taking Bumex 1 and half tablet daily.  He reports that his dorsum of the feet are still swollen.  He may increase  the dose by half tablet in the morning for the next few days.   4.  Peripheral neuropathy: - Feet feel cold intermittently with no neuropathic pains.  5.  Normocytic anemia: - Status post Venofer 1 g from 08/11/2021 through 08/25/2021.  Denies any bleeding per rectum or melena. - Labs today shows ferritin improved to 330 and percent saturation 23.  Hemoglobin however is stable at 10.7.   Orders placed this encounter:  No orders of the defined types were placed in this encounter.    Derek Jack, MD [place] [phone]   I, Thana Ates, am acting as a scribe for Dr. Derek Jack.  I, Derek Jack MD, have reviewed the above documentation for accuracy and completeness, and I agree with the above.

## 2021-10-15 NOTE — Patient Instructions (Addendum)
[place] at [facility] Discharge Instructions  You were seen and examined today by Dr. Delton Coombes.  Dr. Delton Coombes discussed your most recent lab work and everything looks good. PSA results are not back yet however the PSA was elevated the last few times. Dr. Delton Coombes is going to schedule you for a whole body bone scan. Keep appointment with the [facility] 10/22/2021 for CT scan. We will see you back after the scans. Please get a disc from the [place] of the CT scan so that Dr. Delton Coombes can review it.  Follow-up as scheduled.    Thank you for choosing [place] at [facility] to provide your oncology and hematology care.  To afford each patient quality time with our provider, please arrive at least 15 minutes before your scheduled appointment time.   If you have a lab appointment with the [place] please come in thru the Main Entrance and check in at the main information desk.  You need to re-schedule your appointment should you arrive 10 or more minutes late.  We strive to give you quality time with our providers, and arriving late affects you and other patients whose appointments are after yours.  Also, if you no show three or more times for appointments you may be dismissed from the clinic at the providers discretion.     Again, thank you for choosing [facility].  Our hope is that these requests will decrease the amount of time that you wait before being seen by our physicians.       _____________________________________________________________  Should you have questions after your visit to [facility], please contact our office at [phone] and follow the prompts.  Our office hours are 8:00 a.m. and 4:30 p.m. Monday - Friday.  Please note that voicemails left after 4:00 p.m. may not be returned until the following business day.  We are closed weekends and major holidays.  You do have access to a nurse 24-7, just call  the main number to the clinic [phone] and do not press any options, hold on the line and a nurse will answer the phone.    For prescription refill requests, have your pharmacy contact our office and allow 72 hours.    Due to Covid, you will need to wear a mask upon entering the hospital. If you do not have a mask, a mask will be given to you at the Main Entrance upon arrival. For doctor visits, patients may have 1 support person age 72 or older with them. For treatment visits, patients can not have anyone with them due to social distancing guidelines and our immunocompromised population.

## 2021-10-16 LAB — PSA: Prostatic Specific Antigen: 26.45 ng/mL — ABNORMAL HIGH (ref 0.00–4.00)

## 2021-10-28 ENCOUNTER — Encounter (HOSPITAL_COMMUNITY)
Admission: RE | Admit: 2021-10-28 | Discharge: 2021-10-28 | Disposition: A | Discharge status: Home / Self Care | Payer: Medicare PPO | Source: Ambulatory Visit | Attending: Hematology | Admitting: Hematology

## 2021-10-28 DIAGNOSIS — C61 Malignant neoplasm of prostate: Secondary | ICD-10-CM | POA: Diagnosis not present

## 2021-10-28 DIAGNOSIS — C771 Secondary and unspecified malignant neoplasm of intrathoracic lymph nodes: Secondary | ICD-10-CM | POA: Diagnosis present

## 2021-10-28 MED ORDER — TECHNETIUM TC 99M MEDRONATE IV KIT
20.0000 | PACK | Freq: Once | INTRAVENOUS | Status: AC | PRN
  Administered 2021-10-28: 20 via INTRAVENOUS

## 2021-10-29 ENCOUNTER — Encounter (HOSPITAL_COMMUNITY): Payer: Self-pay | Admitting: *Deleted

## 2021-10-29 NOTE — Progress Notes (Signed)
Received report and CD from CT AP done at [facility].  Disk taken to Radiology to be loaded.

## 2021-10-30 ENCOUNTER — Ambulatory Visit
Admission: RE | Admit: 2021-10-30 | Discharge: 2021-10-30 | Disposition: A | Discharge status: Home / Self Care | Payer: Self-pay | Source: Ambulatory Visit | Attending: Hematology | Admitting: Hematology

## 2021-10-30 ENCOUNTER — Other Ambulatory Visit (HOSPITAL_COMMUNITY): Payer: Self-pay | Admitting: Hematology

## 2021-10-30 DIAGNOSIS — C61 Malignant neoplasm of prostate: Secondary | ICD-10-CM

## 2021-11-04 ENCOUNTER — Inpatient Hospital Stay (HOSPITAL_COMMUNITY): Payer: Medicare PPO | Attending: Hematology | Admitting: Hematology

## 2021-11-04 VITALS — BP 120/70 | HR 63 | Temp 98.9°F | Resp 18 | Ht 69.0 in | Wt 242.5 lb

## 2021-11-04 DIAGNOSIS — G629 Polyneuropathy, unspecified: Secondary | ICD-10-CM | POA: Insufficient documentation

## 2021-11-04 DIAGNOSIS — D649 Anemia, unspecified: Secondary | ICD-10-CM | POA: Diagnosis not present

## 2021-11-04 DIAGNOSIS — C771 Secondary and unspecified malignant neoplasm of intrathoracic lymph nodes: Secondary | ICD-10-CM | POA: Insufficient documentation

## 2021-11-04 DIAGNOSIS — Z87891 Personal history of nicotine dependence: Secondary | ICD-10-CM | POA: Insufficient documentation

## 2021-11-04 DIAGNOSIS — C61 Malignant neoplasm of prostate: Secondary | ICD-10-CM | POA: Diagnosis present

## 2021-11-04 DIAGNOSIS — Z7982 Long term (current) use of aspirin: Secondary | ICD-10-CM | POA: Diagnosis not present

## 2021-11-04 DIAGNOSIS — Z79899 Other long term (current) drug therapy: Secondary | ICD-10-CM | POA: Insufficient documentation

## 2021-11-04 DIAGNOSIS — Z923 Personal history of irradiation: Secondary | ICD-10-CM | POA: Insufficient documentation

## 2021-11-04 NOTE — Patient Instructions (Signed)
[place] at [facility] Discharge Instructions  You were seen and examined today by Dr. Delton Coombes.  Dr. Delton Coombes discussed your most recent lab work and your PSA has increased to 26 and CT scan which revealed that you have a small spot on your right scapula.   Dr. Delton Coombes is going to reach out to Dr. Tammi Klippel to see what he thinks about doing radiation to this spot.  Follow-up as scheduled 10 weeks.    Thank you for choosing [place] at [facility] to provide your oncology and hematology care.  To afford each patient quality time with our provider, please arrive at least 15 minutes before your scheduled appointment time.   If you have a lab appointment with the [place] please come in thru the Main Entrance and check in at the main information desk.  You need to re-schedule your appointment should you arrive 10 or more minutes late.  We strive to give you quality time with our providers, and arriving late affects you and other patients whose appointments are after yours.  Also, if you no show three or more times for appointments you may be dismissed from the clinic at the providers discretion.     Again, thank you for choosing [facility].  Our hope is that these requests will decrease the amount of time that you wait before being seen by our physicians.       _____________________________________________________________  Should you have questions after your visit to [facility], please contact our office at [phone] and follow the prompts.  Our office hours are 8:00 a.m. and 4:30 p.m. Monday - Friday.  Please note that voicemails left after 4:00 p.m. may not be returned until the following business day.  We are closed weekends and major holidays.  You do have access to a nurse 24-7, just call the main number to the clinic [phone] and do not press any options, hold on the line and a nurse will answer  the phone.    For prescription refill requests, have your pharmacy contact our office and allow 72 hours.    Due to Covid, you will need to wear a mask upon entering the hospital. If you do not have a mask, a mask will be given to you at the Main Entrance upon arrival. For doctor visits, patients may have 1 support person age 60 or older with them. For treatment visits, patients can not have anyone with them due to social distancing guidelines and our immunocompromised population.

## 2021-11-04 NOTE — Progress Notes (Signed)
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 Docetaxel x 14 cycles from 08/11/2015 through 06/12/2016. 2. Abiraterone from 07/13/2016 through 04/09/2019. 3. Enzalutamide from 04/12/2019 to 11/03/2019.  NGS Results: Foundation 1 MS--stable, TMB 4 Muts/Mb  CURRENT THERAPY: surveillance  BRIEF ONCOLOGIC HISTORY:  Oncology History  Prostate cancer metastatic to intrathoracic lymph node ([place])  07/24/2015 Initial Diagnosis   Prostate cancer metastatic to the left supraclavicular and anterior mediastinal lymph nodes   08/21/2015 - 06/12/2016 Chemotherapy   Docetaxel every 3 weeks for 14 cycles, discontinued as PSA has plateaued around 11    07/13/2016 Treatment Plan Change   Zytiga 1000 milligrams daily along with prednisone 5 mg daily, started secondary to fluid overload from docetaxel   10/14/2018 Genetic Testing   Negative genetic testing.  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  The Common Hereditary Gene Panel offered by [facility] includes sequencing and/or deletion duplication testing of the following 48 genes: APC, ATM, AXIN2, BARD1, BMPR1A, BRCA1, BRCA2, BRIP1, CDH1, CDK4, CDKN2A (p14ARF), CDKN2A (p16INK4a), CHEK2, CTNNA1, DICER1, EPCAM (Deletion/duplication testing only), GREM1 (promoter region deletion/duplication testing only), KIT, MEN1, MLH1, MSH2, MSH3, MSH6, MUTYH, NBN, NF1, NHTL1, PALB2, PDGFRA, PMS2, POLD1, POLE, PTEN, RAD50, RAD51C, RAD51D, RNF43, SDHB, SDHC, SDHD, SMAD4, SMARCA4. STK11, TP53, TSC1, TSC2, and VHL.  The following genes were evaluated for sequence changes only: SDHA and HOXB13 c.251G>A variant only. The report date is Oct 14, 2018.   09/18/2019 Genetic Testing   Foundation One CDx       12/12/2019 Genetic Testing   Guardant 360         CANCER STAGING: Cancer Staging  No matching staging information was found for the patient.  INTERVAL HISTORY:  [name], a 81 y.o. 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 Freman was last seen on 10/15/2021.   Today he reports feeling good. He denies recent injury or pain in his left scapula.   REVIEW OF SYSTEMS:  Review of Systems  Constitutional:  Negative for appetite change and fatigue.  Respiratory:  Positive for shortness of breath.   Neurological:  Positive for numbness.  All other systems reviewed and are negative.   PAST MEDICAL/SURGICAL HISTORY:  Past Medical History:  Diagnosis Date   COPD (chronic obstructive pulmonary disease) ([facility])    Diabetes mellitus without complication (H[facility])    Family history of ovarian cancer    Family history of prostate cancer    Hypertension    Prostate cancer ([place])    Past Surgical History:  Procedure Laterality Date   GOLD SEED IMPLANT      SOCIAL HISTORY:  Social History   Socioeconomic History   Marital status: Widowed    Spouse name: Not on file   Number of children: Not on file   Years of education: Not on file   Highest education level: Not on file  Occupational History   Not on file  Tobacco Use   Smoking status: Former    Packs/day: 0.25    Years: 60.00    Total pack years: 15.00    Types: Cigarettes    Quit date: 06/25/2014    Years since quitting: 7.3   Smokeless tobacco: Never   Tobacco comments:  smoked since young age, quit in 2016  Substance and Sexual Activity   Alcohol use: Not Currently   Drug use: No   Sexual activity: Not Currently  Other Topics Concern   Not on file  Social History Narrative   Not on file   Social Determinants of Health   Financial Resource Strain: Low Risk  (05/21/2020)   Overall Financial Resource Strain (CARDIA)    Difficulty of Paying Living  Expenses: Not hard at all  Food Insecurity: No Food Insecurity (05/21/2020)   Hunger Vital Sign    Worried About Running Out of Food in the Last Year: Never true    Ran Out of Food in the Last Year: Never true  Transportation Needs: No Transportation Needs (05/21/2020)   PRAPARE - Hydrologist (Medical): No    Lack of Transportation (Non-Medical): No  Physical Activity: Insufficiently Active (05/21/2020)   Exercise Vital Sign    Days of Exercise per Week: 2 days    Minutes of Exercise per Session: 20 min  Stress: No Stress Concern Present (05/21/2020)   [place]    Feeling of Stress : Not at all  Social Connections: Moderately Isolated (05/21/2020)   Social Connection and Isolation Panel [NHANES]    Frequency of Communication with Friends and Family: More than three times a week    Frequency of Social Gatherings with Friends and Family: More than three times a week    Attends Religious Services: 1 to 4 times per year    Active Member of [employer] or Organizations: No    Attends Archivist Meetings: Never    Marital Status: Widowed  Intimate Partner Violence: Not At Risk (05/21/2020)   Humiliation, Afraid, Rape, and Kick questionnaire    Fear of Current or Ex-Partner: No    Emotionally Abused: No    Physically Abused: No    Sexually Abused: No    FAMILY HISTORY:  Family History  Problem Relation Age of Onset   Ovarian cancer Mother 45   Heart attack Father    Prostate cancer Maternal Uncle 80   Cancer Cousin        pat cousin with unknown cancer   Colon cancer Neg Hx    Pancreatic cancer Neg Hx    Breast cancer Neg Hx     CURRENT MEDICATIONS:  Current Outpatient Medications  Medication Sig Dispense Refill   ACCU-CHEK GUIDE test strip      Accu-Chek Softclix Lancets lancets      acetaminophen (TYLENOL) 500 MG tablet Take 500 mg by mouth every 6 (six) hours as needed  for moderate pain.     albuterol (ACCUNEB) 0.63 MG/3ML nebulizer solution Take 1 ampule by nebulization every 6 (six) hours as needed for wheezing.     albuterol (PROVENTIL) (2.5 MG/3ML) 0.083% nebulizer solution INHALE 3 ML IN NEBULIZER BY MOUTH FOUR TIMES A DAY AS NEEDED     amLODipine (NORVASC) 2.5 MG tablet Take 1 tablet (2.5 mg total) by mouth daily as needed. 30 tablet 0   ascorbic acid (VITAMIN C) 500 MG tablet Take 1 tablet by mouth daily.     aspirin 81 MG EC tablet Take 1 tablet by mouth daily.     atorvastatin (LIPITOR) 20 MG tablet Take 1 tablet by mouth daily. 34m     azelastine (ASTELIN) 0.1 % nasal spray USE 2 SPRAYS IN EACH NOSTRIL TWICE A DAY FOR ALLERGIC RHINITIS  benzonatate (TESSALON) 200 MG capsule Take by mouth.     Blood Glucose Monitoring Suppl (ACCU-CHEK GUIDE) w/Device KIT      bumetanide (BUMEX) 1 MG tablet TAKE 1 AND 1/2 TABLETS(1.5 MG) BY MOUTH DAILY 45 tablet 6   CALCIUM PO Take 600 mg by mouth daily.      cetirizine (ZYRTEC) 10 MG tablet Take 10 mg by mouth daily.     CHERATUSSIN AC 100-10 MG/5ML syrup Take 5 mLs by mouth daily as needed.   0   Cholecalciferol (VITAMIN D-3 PO) Take 1 tablet by mouth daily.     cyclobenzaprine (FLEXERIL) 10 MG tablet Take 10 mg by mouth as needed.   0   dicyclomine (BENTYL) 20 MG tablet Take 1 tablet by mouth 3 (three) times daily as needed.     dorzolamide (TRUSOPT) 2 % ophthalmic solution Place 1 drop into both eyes 3 (three) times daily.     ferrous sulfate 325 (65 FE) MG tablet Take 1 tablet by mouth daily. (Patient not taking: Reported on 10/15/2021)     fluticasone (FLONASE) 50 MCG/ACT nasal spray USE 2 SPRAY(S) IN EACH NOSTRIL ONCE DAILY FOR 30 DAYS (Patient not taking: Reported on 10/15/2021) 15.8 mL 12   guaifenesin (HUMIBID E) 400 MG TABS tablet Take 1 tablet by mouth 4 (four) times daily as needed.     ibuprofen (ADVIL,MOTRIN) 600 MG tablet Take 600 mg by mouth every 6 (six) hours as needed.   0   Ipratropium-Albuterol  (COMBIVENT) 20-100 MCG/ACT AERS respimat INHALE 1 PUFF BY MOUTH FOUR TIMES A DAY AS NEEDED COPD     Latanoprostene Bunod (VYZULTA) 0.024 % SOLN Apply 1 drop to eye 1 day or 1 dose.     Leuprolide Acetate, 4 Month, (ELIGARD) 30 MG injection 30 MG SUBCUTANEOUSLY Q90D PRN     losartan (COZAAR) 100 MG tablet Take 1 tablet by mouth daily.     magnesium oxide (MAG-OX) 400 MG tablet Take 1 tablet (400 mg total) by mouth every other day. 90 tablet 2   MAGNESIUM-OXIDE 400 (240 Mg) MG tablet TAKE 1 TABLET BY MOUTH EVERY OTHER DAY 90 tablet 2   metFORMIN (GLUCOPHAGE) 500 MG tablet Take 1,000 mg by mouth 2 (two) times daily with a meal.   3   montelukast (SINGULAIR) 10 MG tablet Take 10 mg by mouth at bedtime.     potassium chloride (MICRO-K) 10 MEQ CR capsule TAKE 4 CAPSULES BY MOUTH WITH FOOD. OPEN AND SPRINKLE ON FOOD 120 capsule 3   prochlorperazine (COMPAZINE) 10 MG tablet Take 1 tablet (10 mg total) by mouth every 8 (eight) hours as needed for nausea or vomiting. 30 tablet 3   TRELEGY ELLIPTA 100-62.5-25 MCG/INH AEPB INHALE 1 PUFF ONCE DAILY     vitamin C (ASCORBIC ACID) 500 MG tablet Take 500 mg by mouth daily.     No current facility-administered medications for this visit.    ALLERGIES:  Allergies  Allergen Reactions   Lisinopril Cough   Metoprolol Other (See Comments) and Cough    Increases frequency of cough Other reaction(s): Other (See Comments) Increases frequency of cough    PHYSICAL EXAM:  Performance status (ECOG): 1 - Symptomatic but completely ambulatory  There were no vitals filed for this visit. Wt Readings from Last 3 Encounters:  10/15/21 240 lb 14.4 oz (109.3 kg)  08/25/21 244 lb 6.4 oz (110.9 kg)  08/06/21 245 lb 9 oz (111.4 kg)   Physical Exam Vitals reviewed.  Constitutional:  Appearance: Normal appearance. He is obese.  Cardiovascular:     Rate and Rhythm: Normal rate and regular rhythm.     Pulses: Normal pulses.     Heart sounds: Normal heart sounds.   Pulmonary:     Effort: Pulmonary effort is normal.     Breath sounds: Normal breath sounds.  Neurological:     General: No focal deficit present.     Mental Status: He is alert and oriented to person, place, and time.  Psychiatric:        Mood and Affect: Mood normal.        Behavior: Behavior normal.      LABORATORY DATA:  I have reviewed the labs as listed.     Latest Ref Rng & Units 10/15/2021    1:44 PM 08/06/2021    1:48 PM 02/05/2021    1:54 PM  CBC  WBC 4.0 - 10.5 K/uL 10.2  8.8  8.0   Hemoglobin 13.0 - 17.0 g/dL 10.7  10.3  11.5   Hematocrit 39.0 - 52.0 % 33.8  34.2  37.1   Platelets 150 - 400 K/uL 345  359  346       Latest Ref Rng & Units 10/15/2021    1:44 PM 08/06/2021    1:48 PM 02/05/2021    1:54 PM  CMP  Glucose 70 - 99 mg/dL 143  155  133   BUN 8 - 23 mg/dL 11  14  16    Creatinine 0.61 - 1.24 mg/dL 1.01  0.88  0.88   Sodium 135 - 145 mmol/L 137  137  140   Potassium 3.5 - 5.1 mmol/L 4.1  3.9  3.8   Chloride 98 - 111 mmol/L 105  106  105   CO2 22 - 32 mmol/L 28  25  26    Calcium 8.9 - 10.3 mg/dL 9.2  9.1  9.2   Total Protein 6.5 - 8.1 g/dL 6.6  6.5  6.6   Total Bilirubin 0.3 - 1.2 mg/dL 0.9  0.5  0.5   Alkaline Phos 38 - 126 U/L 54  56  52   AST 15 - 41 U/L 20  14  17    ALT 0 - 44 U/L 18  12  12      DIAGNOSTIC IMAGING:  I have independently reviewed the scans and discussed with the patient. NM Bone Scan Whole Body  Result Date: 10/29/2021 CLINICAL DATA:  Metastatic prostate cancer, assess treatment response EXAM: NUCLEAR MEDICINE WHOLE BODY BONE SCAN TECHNIQUE: Whole body anterior and posterior images were obtained approximately 3 hours after intravenous injection of radiopharmaceutical. RADIOPHARMACEUTICALS:  20 mCi Technetium-66mMDP IV COMPARISON:  11/10/2019 Radiographic correlation: None recent FINDINGS: New focal abnormal tracer uptake at inferior aspect of RIGHT scapula consistent with osseous metastatic disease. No additional worrisome sites of  tracer uptake are seen. Uptake at shoulders, sternoclavicular joints, cervical spine, hips, and RIGHT knee, typically degenerative. Expected urinary tract and soft tissue distribution of tracer. IMPRESSION: New focal abnormal tracer uptake at inferior aspect of RIGHT scapula consistent with osseous metastatic disease. Electronically Signed   By: MLavonia DanaM.D.   On: 10/29/2021 13:22     ASSESSMENT:  1.  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 -Abiraterone and prednisone from 07/13/2016 through 04/09/2019, discontinued secondary to PSA progression. -Enzalutamide from 04/12/2019 through 11/03/2019 with progression. -Last Lupron on 06/09/2019. -CT  CAP on 11/10/2019 shows no findings of active malignancy.  Stable small sclerotic lesions at T4 and T10, nonspecific.  Right hydronephrosis with right proximal hydroureter extending down to a 0.8 x 0.8 x 0.4 cm proximal ureteral stone at L3 vertical level.  Nonobstructive right and possibly left nephrolithiasis. -Bone scan on 11/10/2019 shows right proximal tibial metaphyseal activity from recent trauma.  Degenerative findings in the spine, right sternoclavicular joint, shoulders and right foot.  No evidence of metastatic disease. -Foundation 1 test shows MS-stable.  AR amplification.  Sensitivity is reduced due to sample quality. -Guardant 360 on 12/12/2019 MSI high not detected.  TMB was not evaluable.  No other targetable mutations. -F-18-PYLARIFY PET scan showed intense radiotracer activity in the left supraclavicular and prevascular lymph nodes, measuring 10 mm.  Cluster of small prevascular lymph nodes measures 5-7 mm each with SUV 54.  AP window lymph node measuring 8 mm.  No activity in the prostate gland or abdominal or pelvic lymph nodes.  No skeletal activity. - SBRT to the left supraclavicular lymph node and  prevascular mediastinal lymph node from 08/27/2020 through 09/06/2020, 40 Gray in 5 fractions.   2.  Androgen deprivation therapy induced bone loss: -Received Zometa every 3 months for a year completed on 10/29/2017.   3.  Genetic testing: -Germline mutation testing was negative.   PLAN:  1.  Metastatic prostate cancer to the left supraclavicular and mediastinal lymph nodes: - He does not report any new onset pains. - I have reviewed CT CAP (10/22/2021) done at [facility].  He did not show any lymphadenopathy.  It showed cirrhosis, intrahepatic and extrahepatic biliary ductal dilatation.  Follow-up MRI is being ordered. - I reviewed bone scan from 10/28/2021: New focal abnormal tracer uptake at the inferior aspect of the right scapula. - Last PSA was 26.45 on 10/15/2021, up from 8.6 in March. - I have reached out to Dr. Tammi Klippel to see if further radiation to the right scapula will be helpful.  Upon further discussion, we will proceed on ordering another PSMA PET scan as it is likely that he has more micrometastatic disease.   2.  Androgen deprivation therapy induced bone loss: - Continue calcium and vitamin D supplements. - Consider repeating bone density test in the future.   3.  Leg swelling: - Continue Bumex daily.   4.  Peripheral neuropathy: - Feet feel cold intermittently with no neuropathic pains.  5.  Normocytic anemia: - He received 1 g of Venofer completed on 08/25/2021.   Orders placed this encounter:  No orders of the defined types were placed in this encounter.    Derek Jack, MD [place] [phone]   I, Thana Ates, am acting as a scribe for Dr. Derek Jack.  I, Derek Jack MD, have reviewed the above documentation for accuracy and completeness, and I agree with the above.

## 2021-11-05 ENCOUNTER — Other Ambulatory Visit (HOSPITAL_COMMUNITY): Payer: Self-pay

## 2021-11-05 DIAGNOSIS — C61 Malignant neoplasm of prostate: Secondary | ICD-10-CM

## 2021-11-05 NOTE — Progress Notes (Signed)
Message from Dr. Margretta Sidle, can you please order PSMA PET scan on this patient and let his daughter know.  I have discussed with Dr. Tammi Klippel and we think this is the next best option to see if there is any other spots of cancer that is not seen on bone scan and CT scan.  We will then decide where to radiate.  Thanks.  Orders placed and patient's daughter is aware and agreeable with plan for PSMA PET scan. Message sent to schedulers to get appointment for PSMA PET scan.

## 2021-11-13 ENCOUNTER — Ambulatory Visit (HOSPITAL_COMMUNITY)
Admission: RE | Admit: 2021-11-13 | Discharge: 2021-11-13 | Disposition: A | Discharge status: Home / Self Care | Payer: Medicare PPO | Source: Ambulatory Visit | Attending: Hematology | Admitting: Hematology

## 2021-11-13 DIAGNOSIS — C771 Secondary and unspecified malignant neoplasm of intrathoracic lymph nodes: Secondary | ICD-10-CM | POA: Insufficient documentation

## 2021-11-13 DIAGNOSIS — C61 Malignant neoplasm of prostate: Secondary | ICD-10-CM | POA: Insufficient documentation

## 2021-11-13 MED ORDER — PIFLIFOLASTAT F 18 (PYLARIFY) INJECTION
9.0000 | Freq: Once | INTRAVENOUS | Status: AC
  Administered 2021-11-13: 9.13 via INTRAVENOUS

## 2021-11-20 ENCOUNTER — Inpatient Hospital Stay (HOSPITAL_BASED_OUTPATIENT_CLINIC_OR_DEPARTMENT_OTHER): Payer: Medicare PPO | Admitting: Hematology

## 2021-11-20 VITALS — BP 131/77 | HR 74 | Temp 98.9°F | Resp 18 | Ht 69.0 in | Wt 239.9 lb

## 2021-11-20 DIAGNOSIS — C771 Secondary and unspecified malignant neoplasm of intrathoracic lymph nodes: Secondary | ICD-10-CM | POA: Diagnosis not present

## 2021-11-20 DIAGNOSIS — C61 Malignant neoplasm of prostate: Secondary | ICD-10-CM | POA: Diagnosis not present

## 2021-11-20 NOTE — Progress Notes (Signed)
Andre [name], Andre [name] [id number]   CLINIC:  Medical Oncology/Hematology  PCP:  Clinic, Andre [name] [address] 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 Docetaxel x 14 cycles from 08/11/2015 through 06/12/2016. 2. Abiraterone from 07/13/2016 through 04/09/2019. 3. Enzalutamide from 04/12/2019 to 11/03/2019.  NGS Results: Foundation 1 MS--stable, TMB 4 Muts/Mb  CURRENT THERAPY: surveillance  BRIEF ONCOLOGIC HISTORY:  Oncology History  Prostate cancer metastatic to intrathoracic lymph node ([place])  07/24/2015 Initial Diagnosis   Prostate cancer metastatic to the left supraclavicular and anterior mediastinal lymph nodes   08/21/2015 - 06/12/2016 Chemotherapy   Docetaxel every 3 weeks for 14 cycles, discontinued as PSA has plateaued around 11    07/13/2016 Treatment Plan Change   Zytiga 1000 milligrams daily along with prednisone 5 mg daily, started secondary to fluid overload from docetaxel   10/14/2018 Genetic Testing   Negative genetic testing.  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  The Common Hereditary Gene Panel offered by [facility] includes sequencing and/or deletion duplication testing of the following 48 genes: APC, ATM, AXIN2, BARD1, BMPR1A, BRCA1, BRCA2, BRIP1, CDH1, CDK4, CDKN2A (p14ARF), CDKN2A (p16INK4a), CHEK2, CTNNA1, DICER1, EPCAM (Deletion/duplication testing only), GREM1 (promoter region deletion/duplication testing only), KIT, MEN1, MLH1, MSH2, MSH3, MSH6, MUTYH, NBN, NF1, NHTL1, PALB2, PDGFRA, PMS2, POLD1, POLE, PTEN, RAD50, RAD51C, RAD51D, RNF43, SDHB, SDHC, SDHD, SMAD4, SMARCA4. STK11, TP53, TSC1, TSC2, and VHL.  The following genes were evaluated for sequence changes only: SDHA and HOXB13 c.251G>A variant only. The report date is Oct 14, 2018.   09/18/2019 Genetic Testing   Foundation One CDx       12/12/2019 Genetic Testing   Guardant 360         CANCER STAGING: Cancer Staging  No matching staging information was found for the patient.  INTERVAL HISTORY:  [name], a 81 y.o. 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 Andre [name] was last seen on 11/04/2021.   Today he reports feeling good.   REVIEW OF SYSTEMS:  Review of Systems  Constitutional:  Negative for appetite change and fatigue.  Respiratory:  Positive for cough (COPD).   All other systems reviewed and are negative.   PAST MEDICAL/SURGICAL HISTORY:  Past Medical History:  Diagnosis Date   COPD (chronic obstructive pulmonary disease) ([place])    Diabetes mellitus without complication (HCC)    Family history of ovarian cancer    Family history of prostate cancer    Hypertension    Prostate cancer ([place])    Past Surgical History:  Procedure Laterality Date   GOLD SEED IMPLANT      SOCIAL HISTORY:  Social History   Socioeconomic History   Marital status: Widowed    Spouse name: Not on file   Number of children: Not on file   Years of education: Not on file   Highest education level: Not on file  Occupational History   Not on file  Tobacco Use   Smoking status: Former    Packs/day: 0.25    Years: 60.00    Total pack years: 15.00    Types: Cigarettes    Quit date: 06/25/2014    Years since quitting: 7.4   Smokeless tobacco: Never   Tobacco comments:    smoked since young age, quit in 2016  Substance and Sexual Activity  Alcohol use: Not Currently   Drug use: No   Sexual activity: Not Currently  Other Topics Concern   Not on file  Social History Narrative   Not on file   Social Determinants of Health   Financial Resource Strain: Low Risk  (05/21/2020)   Overall Financial Resource Strain (CARDIA)    Difficulty of Paying Living Expenses: Not hard at all  Food Insecurity: No Food Insecurity (05/21/2020)   Hunger Vital Sign     Worried About Running Out of Food in the Last Year: Never true    Ran Out of Food in the Last Year: Never true  Transportation Needs: No Transportation Needs (05/21/2020)   PRAPARE - Hydrologist (Medical): No    Lack of Transportation (Non-Medical): No  Physical Activity: Insufficiently Active (05/21/2020)   Exercise Vital Sign    Days of Exercise per Week: 2 days    Minutes of Exercise per Session: 20 min  Stress: No Stress Concern Present (05/21/2020)   [place]    Feeling of Stress : Not at all  Social Connections: Moderately Isolated (05/21/2020)   Social Connection and Isolation Panel [NHANES]    Frequency of Communication with Friends and Family: More than three times a week    Frequency of Social Gatherings with Friends and Family: More than three times a week    Attends Religious Services: 1 to 4 times per year    Active Member of [employer] or Organizations: No    Attends Archivist Meetings: Never    Marital Status: Widowed  Intimate Partner Violence: Not At Risk (05/21/2020)   Humiliation, Afraid, Rape, and Kick questionnaire    Fear of Current or Ex-Partner: No    Emotionally Abused: No    Physically Abused: No    Sexually Abused: No    FAMILY HISTORY:  Family History  Problem Relation Age of Onset   Ovarian cancer Mother 46   Heart attack Father    Prostate cancer Maternal Uncle 51   Cancer Cousin        pat cousin with unknown cancer   Colon cancer Neg Hx    Pancreatic cancer Neg Hx    Breast cancer Neg Hx     CURRENT MEDICATIONS:  Current Outpatient Medications  Medication Sig Dispense Refill   ACCU-CHEK GUIDE test strip      Accu-Chek Softclix Lancets lancets      acetaminophen (TYLENOL) 500 MG tablet Take 500 mg by mouth every 6 (six) hours as needed for moderate pain.     albuterol (ACCUNEB) 0.63 MG/3ML nebulizer solution Take 1 ampule by nebulization  every 6 (six) hours as needed for wheezing.     albuterol (PROVENTIL) (2.5 MG/3ML) 0.083% nebulizer solution INHALE 3 ML IN NEBULIZER BY MOUTH FOUR TIMES A DAY AS NEEDED     amLODipine (NORVASC) 2.5 MG tablet Take 1 tablet (2.5 mg total) by mouth daily as needed. 30 tablet 0   ascorbic acid (VITAMIN C) 500 MG tablet Take 1 tablet by mouth daily.     aspirin 81 MG EC tablet Take 1 tablet by mouth daily.     atorvastatin (LIPITOR) 20 MG tablet Take 1 tablet by mouth daily. $RemoveBefo'10mg'aaVkKOgHFry$      azelastine (ASTELIN) 0.1 % nasal spray USE 2 SPRAYS IN EACH NOSTRIL TWICE A DAY FOR ALLERGIC RHINITIS     benzonatate (TESSALON) 200 MG capsule Take by mouth.  Blood Glucose Monitoring Suppl (ACCU-CHEK GUIDE) w/Device KIT      bumetanide (BUMEX) 1 MG tablet TAKE 1 AND 1/2 TABLETS(1.5 MG) BY MOUTH DAILY 45 tablet 6   CALCIUM PO Take 600 mg by mouth daily.      cetirizine (ZYRTEC) 10 MG tablet Take 1 tablet by mouth daily.     CHERATUSSIN AC 100-10 MG/5ML syrup Take 5 mLs by mouth daily as needed.   0   Cholecalciferol (VITAMIN D-3 PO) Take 1 tablet by mouth daily.     cyclobenzaprine (FLEXERIL) 10 MG tablet Take 10 mg by mouth as needed.   0   dicyclomine (BENTYL) 20 MG tablet Take 1 tablet by mouth 3 (three) times daily as needed.     dorzolamide (TRUSOPT) 2 % ophthalmic solution Place 1 drop into both eyes 3 (three) times daily.     ferrous sulfate 325 (65 FE) MG tablet Take 1 tablet by mouth daily.     fluticasone (FLONASE) 50 MCG/ACT nasal spray USE 2 SPRAY(S) IN EACH NOSTRIL ONCE DAILY FOR 30 DAYS 15.8 mL 12   guaifenesin (HUMIBID E) 400 MG TABS tablet Take 1 tablet by mouth 4 (four) times daily as needed.     ibuprofen (ADVIL,MOTRIN) 600 MG tablet Take 600 mg by mouth every 6 (six) hours as needed.   0   Ipratropium-Albuterol (COMBIVENT) 20-100 MCG/ACT AERS respimat INHALE 1 PUFF BY MOUTH FOUR TIMES A DAY AS NEEDED COPD     Latanoprostene Bunod (VYZULTA) 0.024 % SOLN Apply 1 drop to eye 1 day or 1 dose.      Leuprolide Acetate, 4 Month, (ELIGARD) 30 MG injection 30 MG SUBCUTANEOUSLY Q90D PRN     losartan (COZAAR) 100 MG tablet Take 1 tablet by mouth daily.     magnesium oxide (MAG-OX) 400 MG tablet Take 1 tablet (400 mg total) by mouth every other day. 90 tablet 2   MAGNESIUM-OXIDE 400 (240 Mg) MG tablet TAKE 1 TABLET BY MOUTH EVERY OTHER DAY 90 tablet 2   metFORMIN (GLUCOPHAGE) 500 MG tablet Take 1,000 mg by mouth 2 (two) times daily with a meal.   3   montelukast (SINGULAIR) 10 MG tablet Take 10 mg by mouth at bedtime.     potassium chloride (MICRO-K) 10 MEQ CR capsule TAKE 4 CAPSULES BY MOUTH WITH FOOD. OPEN AND SPRINKLE ON FOOD 120 capsule 3   sucralfate (CARAFATE) 1 g tablet Take 1 tablet by mouth 4 (four) times daily as needed.     TRELEGY ELLIPTA 100-62.5-25 MCG/INH AEPB INHALE 1 PUFF ONCE DAILY     vitamin C (ASCORBIC ACID) 500 MG tablet Take 500 mg by mouth daily.     prochlorperazine (COMPAZINE) 10 MG tablet Take 1 tablet (10 mg total) by mouth every 8 (eight) hours as needed for nausea or vomiting. (Patient not taking: Reported on 11/20/2021) 30 tablet 3   No current facility-administered medications for this visit.    ALLERGIES:  Allergies  Allergen Reactions   Lisinopril Cough   Metoprolol Other (See Comments) and Cough    Increases frequency of cough Other reaction(s): Other (See Comments) Increases frequency of cough    PHYSICAL EXAM:  Performance status (ECOG): 1 - Symptomatic but completely ambulatory  There were no vitals filed for this visit. Wt Readings from Last 3 Encounters:  11/20/21 239 lb 13.8 oz (108.8 kg)  11/04/21 242 lb 8 oz (110 kg)  10/15/21 240 lb 14.4 oz (109.3 kg)   Physical Exam Vitals reviewed.  Constitutional:  Appearance: Normal appearance. He is obese.  Cardiovascular:     Rate and Rhythm: Normal rate and regular rhythm.     Pulses: Normal pulses.     Heart sounds: Normal heart sounds.  Pulmonary:     Effort: Pulmonary effort is normal.      Breath sounds: Normal breath sounds.  Neurological:     General: No focal deficit present.     Mental Status: He is alert and oriented to person, place, and time.  Psychiatric:        Mood and Affect: Mood normal.        Behavior: Behavior normal.      LABORATORY DATA:  I have reviewed the labs as listed.     Latest Ref Rng & Units 10/15/2021    1:44 PM 08/06/2021    1:48 PM 02/05/2021    1:54 PM  CBC  WBC 4.0 - 10.5 K/uL 10.2  8.8  8.0   Hemoglobin 13.0 - 17.0 g/dL 10.7  10.3  11.5   Hematocrit 39.0 - 52.0 % 33.8  34.2  37.1   Platelets 150 - 400 K/uL 345  359  346       Latest Ref Rng & Units 10/15/2021    1:44 PM 08/06/2021    1:48 PM 02/05/2021    1:54 PM  CMP  Glucose 70 - 99 mg/dL 143  155  133   BUN 8 - 23 mg/dL $Remove'11  14  16   'cHaRcuu$ Creatinine 0.61 - 1.24 mg/dL 1.01  0.88  0.88   Sodium 135 - 145 mmol/L 137  137  140   Potassium 3.5 - 5.1 mmol/L 4.1  3.9  3.8   Chloride 98 - 111 mmol/L 105  106  105   CO2 22 - 32 mmol/L $RemoveB'28  25  26   'ScFYkfSd$ Calcium 8.9 - 10.3 mg/dL 9.2  9.1  9.2   Total Protein 6.5 - 8.1 g/dL 6.6  6.5  6.6   Total Bilirubin 0.3 - 1.2 mg/dL 0.9  0.5  0.5   Alkaline Phos 38 - 126 U/L 54  56  52   AST 15 - 41 U/L $Remo'20  14  17   'Avezq$ ALT 0 - 44 U/L $Remo'18  12  12     'xrYYN$ DIAGNOSTIC IMAGING:  I have independently reviewed the scans and discussed with the patient. NM PET (PSMA) SKULL TO MID THIGH  Result Date: 11/14/2021 CLINICAL DATA:  Restaging metastatic prostate cancer. EXAM: NUCLEAR MEDICINE PET SKULL BASE TO THIGH TECHNIQUE: 9.13 mCi F18 Piflufolastat (Pylarify) was injected intravenously. Full-ring PET imaging was performed from the skull base to thigh after the radiotracer. CT data was obtained and used for attenuation correction and anatomic localization. COMPARISON:  05/15/2020 FINDINGS: NECK No radiotracer activity in neck lymph nodes. Incidental CT finding: None CHEST The index left supraclavicular lymph node measures 7 mm with SUV max of 2.85, image 63/3. Formally this  measured 1 cm within SUV max of 29.7. Near complete resolution of previously reported intensely tracer avid prevascular lymph nodes. Residual lymph node measures 4 mm within SUV max of 2.85, image 92/3. Previously there was a cluster of multiple small lymph nodes within SUV max equal to 54. Previous tracer avid AP window lymph node has resolved in the interval. New left lower paratracheal lymph node measures 6 mm within SUV max of 3.6, image 92/3. New mediastinal lymph node lateral to the main pulmonary artery measures 4 mm with SUV max of 6.21, image 108/3.  New tiny subcarinal lymph node measures 6 mm within SUV max of 4.29, image 106/3. New tracer avid right axillary lymph node measures 6 mm with SUV max of 15.67, image 94/3. No tracer avid pulmonary nodules identified. Incidental CT finding: Subpleural fibrosis and architectural distortion is identified within the paramediastinal and perihilar left upper lobe. Aortic atherosclerosis and coronary artery calcifications. ABDOMEN/PELVIS Prostate: No focal activity in the prostate bed. Lymph nodes: Multiple small upper abdominal lymph nodes are identified, including: New left retrocrural lymph node measures 6 mm with SUV max of 15.54, image 91/7. New left retroperitoneal lymph node measures 5 mm within SUV max of 7.73, image 175/3. New right retroperitoneal lymph node measures 5 mm within SUV max of 6.28, image 170/3. No tracer avid pelvic or inguinal lymph nodes. Liver: No evidence of liver metastasis Incidental CT finding: Multiple seed implants identified within the prostate gland. Aortic atherosclerosis. Bilateral nonobstructing renal calculi. Benign Bosniak class 1 cyst is noted arising off the left kidney measuring 4 cm, image 161/3. No follow-up recommended. SKELETON Multifocal tracer avid bone metastases are identified. These are new when compared with the previous PET-CT. Compared with the bone scan from 10/28/2021 there are multiple tracer avid bone lesions  which are occult on the recent bone scan. Index lesion within the inferior aspect of the right scapula has an SUV max of 25.31. This corresponds to abnormal uptake on the recent bone scan. Index lesion within the lateral aspect of the right third rib has an SUV max of 11.46. Occult on recent NM bone scan. Tracer avid lesion within the left iliac bone has an SUV max of 4.64. Also occult on bone scan from 10/28/2021. Tracer avid lesion within the L1 vertebral body has an SUV max of 21.57. Also occult on recent NM bone scan. IMPRESSION: 1. Since the previous PET-CT there has been interval development of multifocal tracer avid bone metastases. Only 1 of these lesions is visible on the recent nuclear medicine bone scan dated 10/28/2021. If follow-up of these lesions is clinically indicated repeat Pylarify PET-CT would be advised. 2. Signs suggestive of external beam radiation to the left mediastinum. When compared with the previous PET-CT there has been significant multiplicity and FDG uptake associated with the previous FDG avid prevascular and AP window mediastinal lymph nodes. 3. New tracer avid right axillary, subcarinal, left paratracheal, left retrocrural and upper abdominal lymph nodes compatible with nodal metastasis. Electronically Signed   By: Kerby Moors M.D.   On: 11/14/2021 14:34   NM Bone Scan Whole Body  Result Date: 10/29/2021 CLINICAL DATA:  Metastatic prostate cancer, assess treatment response EXAM: NUCLEAR MEDICINE WHOLE BODY BONE SCAN TECHNIQUE: Whole body anterior and posterior images were obtained approximately 3 hours after intravenous injection of radiopharmaceutical. RADIOPHARMACEUTICALS:  20 mCi Technetium-9m MDP IV COMPARISON:  11/10/2019 Radiographic correlation: None recent FINDINGS: New focal abnormal tracer uptake at inferior aspect of RIGHT scapula consistent with osseous metastatic disease. No additional worrisome sites of tracer uptake are seen. Uptake at shoulders,  sternoclavicular joints, cervical spine, hips, and RIGHT knee, typically degenerative. Expected urinary tract and soft tissue distribution of tracer. IMPRESSION: New focal abnormal tracer uptake at inferior aspect of RIGHT scapula consistent with osseous metastatic disease. Electronically Signed   By: Lavonia Dana M.D.   On: 10/29/2021 13:22     ASSESSMENT:  1.  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 -  Abiraterone and prednisone from 07/13/2016 through 04/09/2019, discontinued secondary to PSA progression. -Enzalutamide from 04/12/2019 through 11/03/2019 with progression. -Last Lupron on 06/09/2019. -CT CAP on 11/10/2019 shows no findings of active malignancy.  Stable small sclerotic lesions at T4 and T10, nonspecific.  Right hydronephrosis with right proximal hydroureter extending down to a 0.8 x 0.8 x 0.4 cm proximal ureteral stone at L3 vertical level.  Nonobstructive right and possibly left nephrolithiasis. -Bone scan on 11/10/2019 shows right proximal tibial metaphyseal activity from recent trauma.  Degenerative findings in the spine, right sternoclavicular joint, shoulders and right foot.  No evidence of metastatic disease. -Foundation 1 test shows MS-stable.  AR amplification.  Sensitivity is reduced due to sample quality. -Guardant 360 on 12/12/2019 MSI high not detected.  TMB was not evaluable.  No other targetable mutations. -F-18-PYLARIFY PET scan showed intense radiotracer activity in the left supraclavicular and prevascular lymph nodes, measuring 10 mm.  Cluster of small prevascular lymph nodes measures 5-7 mm each with SUV 54.  AP window lymph node measuring 8 mm.  No activity in the prostate gland or abdominal or pelvic lymph nodes.  No skeletal activity. - SBRT to the left supraclavicular lymph node and prevascular mediastinal lymph node from  08/27/2020 through 09/06/2020, 40 Gray in 5 fractions. - CT CAP (10/22/2021): Done at [facility]: No lymphadenopathy.  Cirrhosis.  Intrahepatic and extrahepatic biliary ductal dilatation. - Bone scan (10/28/2021): New focal abnormal tracer uptake at the inferior aspect of the right scapula. - PSMA PET scan (11/13/2021): Interval development of multifocal tracer avid bone metastasis, only 1 of these lesions is visible on the recent nuclear medicine bone scan dated 10/28/2021.  New tracer avid right axillary, subcarinal, left paratracheal, left retrocrural and upper abdominal lymph nodes.   2.  Androgen deprivation therapy induced bone loss: -Received Zometa every 3 months for a year completed on 10/29/2017.   3.  Genetic testing: -Germline mutation testing was negative.   PLAN:  1.  Metastatic prostate cancer to the left supraclavicular and mediastinal lymph nodes: -His last PSA was 26.45 on 10/15/2021, trending up. - We reviewed PSMA PET scan results which showed new bone metastasis and multiple lymph node metastasis although small in size. - We do not have a Pluvicto available at this time. - I discussed next best option of starting him on chemotherapy with cabazitaxel.  We discussed side effects in detail.  Literature was given to him. - We will tentatively start his treatment in the next 3 to 4 weeks.  We will also start him on prednisone 5-10 mg daily.  I will start him on lower dose of cabazitaxel 15 mg per metered square based on his age.  He has tolerated prior docetaxel very well. - He will continue Lupron every 6 months. - RTC cycle 1 day 1 with labs.   2.  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 - We will consider starting him on denosumab injections after the chemotherapy starts.   3.  Leg swelling: -Continue Bumex daily.   4.  Peripheral neuropathy: -Feet feel cold intermittently with no neuropathic pains.  We will closely monitor.  5.   Normocytic anemia: -He received 1 g of Venofer on 08/25/2021. - Ferritin is 330 and percent saturation is 23.  Hemoglobin is 10.7.   Orders placed this encounter:  No orders of the defined types were placed in this encounter.    Derek Jack, MD [facility] [phone]   I,  Thana Ates, am acting as a Education administrator for Dr. Derek Jack.  I, Derek Jack MD, have reviewed the above documentation for accuracy and completeness, and I agree with the above.

## 2021-11-20 NOTE — Patient Instructions (Addendum)
[place] at [facility] Discharge Instructions   You were seen and examined today by Dr. Delton Coombes.  He reviewed the results of the PET scan. There is prostate cancer present in your lymph nodes and in a lot of spots on your bones. He discussed treatment options with you. One includes an infusion , but this drug is on backorder indefinitely. The other option is chemotherapy. Dr. Raliegh Ip discussed starting on a chemotherapy drug called Jevtana. This is given in the clinic once every 3 weeks. You would also get a white blood cell booster shot each time after receiving this drug.   Return as scheduled for lab work and treatment.      Thank you for choosing [place] at [facility] to provide your oncology and hematology care.  To afford each patient quality time with our provider, please arrive at least 15 minutes before your scheduled appointment time.   If you have a lab appointment with the [facility] please come in thru the Main Entrance and check in at the main information desk.  You need to re-schedule your appointment should you arrive 10 or more minutes late.  We strive to give you quality time with our providers, and arriving late affects you and other patients whose appointments are after yours.  Also, if you no show three or more times for appointments you may be dismissed from the clinic at the providers discretion.     Again, thank you for choosing [facility].  Our hope is that these requests will decrease the amount of time that you wait before being seen by our physicians.       _____________________________________________________________  Should you have questions after your visit to [facility], please contact our office at [phone] and follow the prompts.  Our office hours are 8:00 a.m. and 4:30 p.m. Monday - Friday.  Please note that voicemails left after 4:00 p.m. may not be returned until the  following business day.  We are closed weekends and major holidays.  You do have access to a nurse 24-7, just call the main number to the clinic [phone] and do not press any options, hold on the line and a nurse will answer the phone.    For prescription refill requests, have your pharmacy contact our office and allow 72 hours.    Due to Covid, you will need to wear a mask upon entering the hospital. If you do not have a mask, a mask will be given to you at the Main Entrance upon arrival. For doctor visits, patients may have 1 support person age 33 or older with them. For treatment visits, patients can not have anyone with them due to social distancing guidelines and our immunocompromised population.

## 2021-11-28 ENCOUNTER — Encounter (HOSPITAL_COMMUNITY): Payer: Self-pay | Admitting: Hematology

## 2021-11-28 NOTE — Progress Notes (Signed)
START ON PATHWAY REGIMEN - Prostate     A cycle is every 21 days.:     Cabazitaxel      Prednisone   **Always confirm dose/schedule in your pharmacy ordering system**  Patient Characteristics: Adenocarcinoma, Recurrent/New Systemic Disease (Including Biochemical Recurrence), Castration Resistant, M1, Prior Novel Hormonal Agent, No Molecular Alteration or Targeted Therapy Exhausted, Prior Docetaxel/Docetaxel Not Indicated Histology: Adenocarcinoma Therapeutic Status: Recurrent/New Systemic Disease (Including Biochemical Recurrence)  Intent of Therapy: Non-Curative / Palliative Intent, Discussed with Patient 

## 2021-12-14 MED ORDER — PROCHLORPERAZINE MALEATE 10 MG PO TABS: 10.0000 mg | ORAL_TABLET | Freq: Four times a day (QID) | ORAL | 3 refills | Status: AC | PRN

## 2021-12-14 MED ORDER — PREDNISONE 5 MG PO TABS: 5.0000 mg | ORAL_TABLET | Freq: Every day | ORAL | 3 refills | Status: DC

## 2021-12-14 MED ORDER — PROCHLORPERAZINE MALEATE 10 MG PO TABS: 10.0000 mg | ORAL_TABLET | Freq: Four times a day (QID) | ORAL | 3 refills | Status: DC | PRN

## 2021-12-15 ENCOUNTER — Inpatient Hospital Stay (HOSPITAL_COMMUNITY): Payer: Medicare PPO | Attending: Hematology

## 2021-12-15 ENCOUNTER — Inpatient Hospital Stay (HOSPITAL_COMMUNITY): Payer: Medicare PPO

## 2021-12-15 ENCOUNTER — Inpatient Hospital Stay (HOSPITAL_BASED_OUTPATIENT_CLINIC_OR_DEPARTMENT_OTHER): Payer: Medicare PPO | Admitting: Hematology

## 2021-12-15 ENCOUNTER — Other Ambulatory Visit: Payer: Self-pay

## 2021-12-15 ENCOUNTER — Other Ambulatory Visit (HOSPITAL_COMMUNITY): Payer: Medicare PPO

## 2021-12-15 ENCOUNTER — Other Ambulatory Visit (HOSPITAL_COMMUNITY): Payer: Self-pay | Admitting: *Deleted

## 2021-12-15 ENCOUNTER — Ambulatory Visit (HOSPITAL_COMMUNITY): Payer: Medicare PPO

## 2021-12-15 ENCOUNTER — Ambulatory Visit (HOSPITAL_COMMUNITY): Payer: Medicare PPO | Admitting: Hematology

## 2021-12-15 VITALS — BP 149/76 | HR 72 | Temp 97.7°F | Resp 18

## 2021-12-15 DIAGNOSIS — Z5189 Encounter for other specified aftercare: Secondary | ICD-10-CM | POA: Insufficient documentation

## 2021-12-15 DIAGNOSIS — C61 Malignant neoplasm of prostate: Secondary | ICD-10-CM | POA: Diagnosis present

## 2021-12-15 DIAGNOSIS — Z5111 Encounter for antineoplastic chemotherapy: Secondary | ICD-10-CM | POA: Insufficient documentation

## 2021-12-15 DIAGNOSIS — C771 Secondary and unspecified malignant neoplasm of intrathoracic lymph nodes: Secondary | ICD-10-CM | POA: Insufficient documentation

## 2021-12-15 DIAGNOSIS — Z87891 Personal history of nicotine dependence: Secondary | ICD-10-CM | POA: Diagnosis not present

## 2021-12-15 DIAGNOSIS — D649 Anemia, unspecified: Secondary | ICD-10-CM | POA: Diagnosis not present

## 2021-12-15 DIAGNOSIS — Z79899 Other long term (current) drug therapy: Secondary | ICD-10-CM | POA: Insufficient documentation

## 2021-12-15 DIAGNOSIS — C7951 Secondary malignant neoplasm of bone: Secondary | ICD-10-CM | POA: Insufficient documentation

## 2021-12-15 LAB — FERRITIN: Ferritin: 117 ng/mL (ref 24–336)

## 2021-12-15 LAB — CBC WITH DIFFERENTIAL/PLATELET
Abs Immature Granulocytes: 0.04 10*3/uL (ref 0.00–0.07)
Basophils Absolute: 0.1 10*3/uL (ref 0.0–0.1)
Basophils Relative: 1 %
Eosinophils Absolute: 0.1 10*3/uL (ref 0.0–0.5)
Eosinophils Relative: 1 %
HCT: 35.3 % — ABNORMAL LOW (ref 39.0–52.0)
Hemoglobin: 11.4 g/dL — ABNORMAL LOW (ref 13.0–17.0)
Immature Granulocytes: 0 %
Lymphocytes Relative: 11 %
Lymphs Abs: 1.1 10*3/uL (ref 0.7–4.0)
MCH: 27.6 pg (ref 26.0–34.0)
MCHC: 32.3 g/dL (ref 30.0–36.0)
MCV: 85.5 fL (ref 80.0–100.0)
Monocytes Absolute: 1.1 10*3/uL — ABNORMAL HIGH (ref 0.1–1.0)
Monocytes Relative: 11 %
Neutro Abs: 7.4 10*3/uL (ref 1.7–7.7)
Neutrophils Relative %: 76 %
Platelets: 353 10*3/uL (ref 150–400)
RBC: 4.13 MIL/uL — ABNORMAL LOW (ref 4.22–5.81)
RDW: 14.7 % (ref 11.5–15.5)
WBC: 9.8 10*3/uL (ref 4.0–10.5)
nRBC: 0 % (ref 0.0–0.2)

## 2021-12-15 LAB — COMPREHENSIVE METABOLIC PANEL
ALT: 10 U/L (ref 0–44)
AST: 14 U/L — ABNORMAL LOW (ref 15–41)
Albumin: 3.8 g/dL (ref 3.5–5.0)
Alkaline Phosphatase: 60 U/L (ref 38–126)
Anion gap: 8 (ref 5–15)
BUN: 16 mg/dL (ref 8–23)
CO2: 28 mmol/L (ref 22–32)
Calcium: 9.3 mg/dL (ref 8.9–10.3)
Chloride: 104 mmol/L (ref 98–111)
Creatinine, Ser: 0.98 mg/dL (ref 0.61–1.24)
GFR, Estimated: >60 mL/min (ref >60)
Glucose, Bld: 149 mg/dL — ABNORMAL HIGH (ref 70–99)
Potassium: 3.6 mmol/L (ref 3.5–5.1)
Sodium: 140 mmol/L (ref 135–145)
Total Bilirubin: 0.8 mg/dL (ref 0.3–1.2)
Total Protein: 6.8 g/dL (ref 6.5–8.1)

## 2021-12-15 LAB — IRON AND TIBC
Iron: 34 ug/dL — ABNORMAL LOW (ref 45–182)
Saturation Ratios: 10 % — ABNORMAL LOW (ref 17.9–39.5)
TIBC: 325 ug/dL (ref 250–450)
UIBC: 291 ug/dL

## 2021-12-15 LAB — MAGNESIUM: Magnesium: 1.8 mg/dL (ref 1.7–2.4)

## 2021-12-15 LAB — PSA: Prostatic Specific Antigen: 56.53 ng/mL — ABNORMAL HIGH (ref 0.00–4.00)

## 2021-12-15 MED ORDER — PEGFILGRASTIM 6 MG/0.6ML ~~LOC~~ PSKT
6.0000 mg | PREFILLED_SYRINGE | Freq: Once | SUBCUTANEOUS | Status: AC
  Administered 2021-12-15: 6 mg via SUBCUTANEOUS
  Filled 2021-12-15: qty 0.6

## 2021-12-15 MED ORDER — SODIUM CHLORIDE 0.9 % IV SOLN
10.0000 mg | Freq: Once | INTRAVENOUS | Status: AC
  Administered 2021-12-15: 10 mg via INTRAVENOUS
  Filled 2021-12-15: qty 1

## 2021-12-15 MED ORDER — FAMOTIDINE IN NACL 20-0.9 MG/50ML-% IV SOLN
20.0000 mg | Freq: Once | INTRAVENOUS | Status: AC
  Administered 2021-12-15: 20 mg via INTRAVENOUS
  Filled 2021-12-15: qty 50

## 2021-12-15 MED ORDER — SODIUM CHLORIDE 0.9 % IV SOLN: Freq: Once | INTRAVENOUS | Status: AC

## 2021-12-15 MED ORDER — DIPHENHYDRAMINE HCL 50 MG/ML IJ SOLN
25.0000 mg | Freq: Once | INTRAMUSCULAR | Status: AC
  Administered 2021-12-15: 25 mg via INTRAVENOUS
  Filled 2021-12-15: qty 1

## 2021-12-15 MED ORDER — SODIUM CHLORIDE 0.9 % IV SOLN
15.0000 mg/m2 | Freq: Once | INTRAVENOUS | Status: AC
  Administered 2021-12-15: 35 mg via INTRAVENOUS
  Filled 2021-12-15: qty 3.5

## 2021-12-15 MED ORDER — HEPARIN SOD (PORK) LOCK FLUSH 100 UNIT/ML IV SOLN
500.0000 [IU] | Freq: Once | INTRAVENOUS | Status: AC | PRN
  Administered 2021-12-15: 500 [IU]

## 2021-12-15 MED ORDER — SODIUM CHLORIDE 0.9% FLUSH
10.0000 mL | INTRAVENOUS | Status: DC | PRN
  Administered 2021-12-15 (×2): 10 mL

## 2021-12-15 MED ORDER — SODIUM CHLORIDE 0.9 % IV SOLN
8.0000 mg | Freq: Once | INTRAVENOUS | Status: AC
  Administered 2021-12-15: 8 mg via INTRAVENOUS
  Filled 2021-12-15: qty 4

## 2021-12-15 NOTE — Patient Instructions (Addendum)
[place] at [facility] Discharge Instructions   You were seen and examined today by Dr. Delton Coombes.  Your lab results are pending.  He discussed adding a monthly shot called Xgeva to your treatment. This is to help strengthen your bones since the cancer has spread to your bones.   Prednisone and Compazine were sent to your pharmacy. Take as prescribed.   Return as scheduled.    Thank you for choosing [place] at [facility] to provide your oncology and hematology care.  To afford each patient quality time with our provider, please arrive at least 15 minutes before your scheduled appointment time.   If you have a lab appointment with the [facility] please come in thru the Main Entrance and check in at the main information desk.  You need to re-schedule your appointment should you arrive 10 or more minutes late.  We strive to give you quality time with our providers, and arriving late affects you and other patients whose appointments are after yours.  Also, if you no show three or more times for appointments you may be dismissed from the clinic at the providers discretion.     Again, thank you for choosing [facility].  Our hope is that these requests will decrease the amount of time that you wait before being seen by our physicians.       _____________________________________________________________  Should you have questions after your visit to [facility], please contact our office at [phone] and follow the prompts.  Our office hours are 8:00 a.m. and 4:30 p.m. Monday - Friday.  Please note that voicemails left after 4:00 p.m. may not be returned until the following business day.  We are closed weekends and major holidays.  You do have access to a nurse 24-7, just call the main number to the clinic [phone] and do not press any options, hold on the line and a nurse will answer the phone.    For  prescription refill requests, have your pharmacy contact our office and allow 72 hours.    Due to Covid, you will need to wear a mask upon entering the hospital. If you do not have a mask, a mask will be given to you at the Main Entrance upon arrival. For doctor visits, patients may have 1 support person age 73 or older with them. For treatment visits, patients can not have anyone with them due to social distancing guidelines and our immunocompromised population.

## 2021-12-15 NOTE — Progress Notes (Signed)
[place] [place], [place] [address]   CLINIC:  Medical Oncology/Hematology  PCP:  Clinic, [facility] [address] 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 Docetaxel x 14 cycles from 08/11/2015 through 06/12/2016. 2. Abiraterone from 07/13/2016 through 04/09/2019. 3. Enzalutamide from 04/12/2019 to 11/03/2019.  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  The Common Hereditary Gene Panel offered by [facility] includes sequencing and/or deletion duplication testing of the following 48 genes: APC, ATM, AXIN2, BARD1, BMPR1A, BRCA1, BRCA2, BRIP1, CDH1, CDK4, CDKN2A (p14ARF), CDKN2A (p16INK4a), CHEK2, CTNNA1, DICER1, EPCAM (Deletion/duplication testing only), GREM1 (promoter region deletion/duplication testing only), KIT, MEN1, MLH1, MSH2, MSH3, MSH6, MUTYH, NBN, NF1, NHTL1, PALB2, PDGFRA, PMS2, POLD1, POLE, PTEN, RAD50, RAD51C, RAD51D, RNF43, SDHB, SDHC, SDHD, SMAD4, SMARCA4. STK11, TP53, TSC1, TSC2, and VHL.  The following genes were evaluated for sequence changes only: SDHA and HOXB13 c.251G>A variant only. The report date is Oct 14, 2018.   09/18/2019 Genetic Testing   Foundation One  CDx      12/12/2019 Genetic Testing   Guardant 360       12/15/2021 -  Chemotherapy   Patient is on Treatment Plan : PROSTATE Cabazitaxel + Prednisone q21d       CANCER STAGING:  Cancer Staging  No matching staging information was found for the patient.  INTERVAL HISTORY:  Mr. NORVAL SLAVEN, a 81 y.o. male, returns for routine follow-up and consideration for next cycle of chemotherapy. [place] was last seen on 11/28/2021.  Due for cycle #1 of Cabazitaxel today.   Overall, he tells me he has been feeling pretty well. He is eating well, and his energy is stable. He reports continued intermittent cold sensation in his feet which is stable; he denies associated pain or burning sensation. He reports occasional difficulty with balance upon standing quickly. He denies any falls.   Overall, he feels ready for next cycle of chemo today.    REVIEW OF SYSTEMS:  Review of Systems  Constitutional:  Negative for appetite change and fatigue.  Respiratory:  Positive for cough (COPD).   Musculoskeletal:  Positive for arthralgias (R shoulder).  All other systems reviewed and are negative.   PAST MEDICAL/SURGICAL HISTORY:  Past Medical History:  Diagnosis Date   COPD (chronic obstructive pulmonary disease) ([place])    Diabetes mellitus without complication (HCC)    Family history of ovarian cancer    Family history of prostate cancer    Hypertension    Prostate cancer [place])    Past Surgical History:  Procedure Laterality Date   GOLD SEED IMPLANT      SOCIAL HISTORY:  Social History   Socioeconomic History   Marital status: Widowed    Spouse name: Not on file   Number  of children: Not on file   Years of education: Not on file   Highest education level: Not on file  Occupational History   Not on file  Tobacco Use   Smoking status: Former    Packs/day: 0.25    Years: 60.00    Total pack years: 15.00    Types: Cigarettes    Quit date: 06/25/2014    Years since quitting: 7.4    Smokeless tobacco: Never   Tobacco comments:    smoked since young age, quit in 2016  Substance and Sexual Activity   Alcohol use: Not Currently   Drug use: No   Sexual activity: Not Currently  Other Topics Concern   Not on file  Social History Narrative   Not on file   Social Determinants of Health   Financial Resource Strain: Low Risk  (05/21/2020)   Overall Financial Resource Strain (CARDIA)    Difficulty of Paying Living Expenses: Not hard at all  Food Insecurity: No Food Insecurity (05/21/2020)   Hunger Vital Sign    Worried About Running Out of Food in the Last Year: Never true    [place] in the Last Year: Never true  Transportation Needs: No Transportation Needs (05/21/2020)   PRAPARE - Hydrologist (Medical): No    Lack of Transportation (Non-Medical): No  Physical Activity: Insufficiently Active (05/21/2020)   Exercise Vital Sign    Days of Exercise per Week: 2 days    Minutes of Exercise per Session: 20 min  Stress: No Stress Concern Present (05/21/2020)   [place]    Feeling of Stress : Not at all  Social Connections: Moderately Isolated (05/21/2020)   Social Connection and Isolation Panel [NHANES]    Frequency of Communication with Friends and Family: More than three times a week    Frequency of Social Gatherings with Friends and Family: More than three times a week    Attends Religious Services: 1 to 4 times per year    Active Member of [employer] or Organizations: No    Attends Archivist Meetings: Never    Marital Status: Widowed  Intimate Partner Violence: Not At Risk (05/21/2020)   Humiliation, Afraid, Rape, and Kick questionnaire    Fear of Current or Ex-Partner: No    Emotionally Abused: No    Physically Abused: No    Sexually Abused: No    FAMILY HISTORY:  Family History  Problem Relation Age of Onset   Ovarian cancer Mother 10   Heart  attack Father    Prostate cancer Maternal Uncle 58   Cancer Cousin        pat cousin with unknown cancer   Colon cancer Neg Hx    Pancreatic cancer Neg Hx    Breast cancer Neg Hx     CURRENT MEDICATIONS:  Current Outpatient Medications  Medication Sig Dispense Refill   ACCU-CHEK GUIDE test strip      Accu-Chek Softclix Lancets lancets      acetaminophen (TYLENOL) 500 MG tablet Take 500 mg by mouth every 6 (six) hours as needed for moderate pain.     albuterol (ACCUNEB) 0.63 MG/3ML nebulizer solution Take 1 ampule by nebulization every 6 (six) hours as needed for wheezing.     albuterol (PROVENTIL) (2.5 MG/3ML) 0.083% nebulizer solution INHALE 3 ML IN NEBULIZER BY MOUTH FOUR TIMES A DAY AS NEEDED     amLODipine (NORVASC) 2.5 MG tablet  Take 1 tablet (2.5 mg total) by mouth daily as needed. 30 tablet 0   ascorbic acid (VITAMIN C) 500 MG tablet Take 1 tablet by mouth daily.     aspirin 81 MG EC tablet Take 1 tablet by mouth daily.     atorvastatin (LIPITOR) 20 MG tablet Take 1 tablet by mouth daily. 35m     azelastine (ASTELIN) 0.1 % nasal spray USE 2 SPRAYS IN EACH NOSTRIL TWICE A DAY FOR ALLERGIC RHINITIS     benzonatate (TESSALON) 200 MG capsule Take by mouth.     Blood Glucose Monitoring Suppl (ACCU-CHEK GUIDE) w/Device KIT      bumetanide (BUMEX) 1 MG tablet TAKE 1 AND 1/2 TABLETS(1.5 MG) BY MOUTH DAILY 45 tablet 6   CALCIUM PO Take 600 mg by mouth daily.      cetirizine (ZYRTEC) 10 MG tablet Take 1 tablet by mouth daily.     CHERATUSSIN AC 100-10 MG/5ML syrup Take 5 mLs by mouth daily as needed.   0   Cholecalciferol (VITAMIN D-3 PO) Take 1 tablet by mouth daily.     cyclobenzaprine (FLEXERIL) 10 MG tablet Take 10 mg by mouth as needed.   0   dicyclomine (BENTYL) 20 MG tablet Take 1 tablet by mouth 3 (three) times daily as needed.     dorzolamide (TRUSOPT) 2 % ophthalmic solution Place 1 drop into both eyes 3 (three) times daily.     ferrous sulfate 325 (65 FE) MG tablet Take 1  tablet by mouth daily.     fluticasone (FLONASE) 50 MCG/ACT nasal spray USE 2 SPRAY(S) IN EACH NOSTRIL ONCE DAILY FOR 30 DAYS 15.8 mL 12   guaifenesin (HUMIBID E) 400 MG TABS tablet Take 1 tablet by mouth 4 (four) times daily as needed.     ibuprofen (ADVIL,MOTRIN) 600 MG tablet Take 600 mg by mouth every 6 (six) hours as needed.   0   Ipratropium-Albuterol (COMBIVENT) 20-100 MCG/ACT AERS respimat INHALE 1 PUFF BY MOUTH FOUR TIMES A DAY AS NEEDED COPD     latanoprost (XALATAN) 0.005 % ophthalmic solution INSTILL 1 DROP INTO AFFECTED EYE(S) BY OPHTHALMIC ROUTE ONCE DAILY INTHE EVENING     Leuprolide Acetate, 4 Month, (ELIGARD) 30 MG injection 30 MG SUBCUTANEOUSLY Q90D PRN     losartan (COZAAR) 100 MG tablet Take 1 tablet by mouth daily.     magnesium oxide (MAG-OX) 400 MG tablet Take 1 tablet (400 mg total) by mouth every other day. 90 tablet 2   MAGNESIUM-OXIDE 400 (240 Mg) MG tablet TAKE 1 TABLET BY MOUTH EVERY OTHER DAY 90 tablet 2   metFORMIN (GLUCOPHAGE) 500 MG tablet Take 1,000 mg by mouth 2 (two) times daily with a meal.   3   montelukast (SINGULAIR) 10 MG tablet Take 10 mg by mouth at bedtime.     potassium chloride (MICRO-K) 10 MEQ CR capsule TAKE 4 CAPSULES BY MOUTH WITH FOOD. OPEN AND SPRINKLE ON FOOD 120 capsule 3   predniSONE (DELTASONE) 5 MG tablet Take 1 tablet (5 mg total) by mouth daily with breakfast. 30 tablet 3   sucralfate (CARAFATE) 1 g tablet Take 1 tablet by mouth 4 (four) times daily as needed.     TRELEGY ELLIPTA 100-62.5-25 MCG/INH AEPB INHALE 1 PUFF ONCE DAILY     vitamin C (ASCORBIC ACID) 500 MG tablet Take 500 mg by mouth daily.     prochlorperazine (COMPAZINE) 10 MG tablet Take 1 tablet (10 mg total) by mouth every 6 (six) hours as  needed for nausea or vomiting. (Patient not taking: Reported on 12/15/2021) 60 tablet 3   No current facility-administered medications for this visit.    ALLERGIES:  Allergies  Allergen Reactions   Lisinopril Cough   Metoprolol Other  (See Comments) and Cough    Increases frequency of cough Other reaction(s): Other (See Comments) Increases frequency of cough    PHYSICAL EXAM:  Performance status (ECOG): 1 - Symptomatic but completely ambulatory  There were no vitals filed for this visit. Wt Readings from Last 3 Encounters:  12/15/21 241 lb 9.6 oz (109.6 kg)  11/20/21 239 lb 13.8 oz (108.8 kg)  11/04/21 242 lb 8 oz (110 kg)   Physical Exam Vitals reviewed.  Constitutional:      Appearance: Normal appearance. He is obese.  Cardiovascular:     Rate and Rhythm: Normal rate and regular rhythm.     Pulses: Normal pulses.     Heart sounds: Normal heart sounds.  Pulmonary:     Effort: Pulmonary effort is normal.     Breath sounds: Normal breath sounds.  Neurological:     General: No focal deficit present.     Mental Status: He is alert and oriented to person, place, and time.  Psychiatric:        Mood and Affect: Mood normal.        Behavior: Behavior normal.     LABORATORY DATA:  I have reviewed the labs as listed.     Latest Ref Rng & Units 12/15/2021    8:47 AM 10/15/2021    1:44 PM 08/06/2021    1:48 PM  CBC  WBC 4.0 - 10.5 K/uL 9.8  10.2  8.8   Hemoglobin 13.0 - 17.0 g/dL 11.4  10.7  10.3   Hematocrit 39.0 - 52.0 % 35.3  33.8  34.2   Platelets 150 - 400 K/uL 353  345  359       Latest Ref Rng & Units 10/15/2021    1:44 PM 08/06/2021    1:48 PM 02/05/2021    1:54 PM  CMP  Glucose 70 - 99 mg/dL 143  155  133   BUN 8 - 23 mg/dL 11  14  16    Creatinine 0.61 - 1.24 mg/dL 1.01  0.88  0.88   Sodium 135 - 145 mmol/L 137  137  140   Potassium 3.5 - 5.1 mmol/L 4.1  3.9  3.8   Chloride 98 - 111 mmol/L 105  106  105   CO2 22 - 32 mmol/L 28  25  26    Calcium 8.9 - 10.3 mg/dL 9.2  9.1  9.2   Total Protein 6.5 - 8.1 g/dL 6.6  6.5  6.6   Total Bilirubin 0.3 - 1.2 mg/dL 0.9  0.5  0.5   Alkaline Phos 38 - 126 U/L 54  56  52   AST 15 - 41 U/L 20  14  17    ALT 0 - 44 U/L 18  12  12      DIAGNOSTIC IMAGING:   I have independently reviewed the scans and discussed with the patient. No results found.   ASSESSMENT:  1.  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 -Abiraterone and prednisone from 07/13/2016 through 04/09/2019, discontinued secondary to PSA progression. -Enzalutamide from 04/12/2019 through 11/03/2019 with progression. -Last Lupron on 06/09/2019. -CT CAP on 11/10/2019 shows no findings of active  malignancy.  Stable small sclerotic lesions at T4 and T10, nonspecific.  Right hydronephrosis with right proximal hydroureter extending down to a 0.8 x 0.8 x 0.4 cm proximal ureteral stone at L3 vertical level.  Nonobstructive right and possibly left nephrolithiasis. -Bone scan on 11/10/2019 shows right proximal tibial metaphyseal activity from recent trauma.  Degenerative findings in the spine, right sternoclavicular joint, shoulders and right foot.  No evidence of metastatic disease. -Foundation 1 test shows MS-stable.  AR amplification.  Sensitivity is reduced due to sample quality. -Guardant 360 on 12/12/2019 MSI high not detected.  TMB was not evaluable.  No other targetable mutations. -F-18-PYLARIFY PET scan showed intense radiotracer activity in the left supraclavicular and prevascular lymph nodes, measuring 10 mm.  Cluster of small prevascular lymph nodes measures 5-7 mm each with SUV 54.  AP window lymph node measuring 8 mm.  No activity in the prostate gland or abdominal or pelvic lymph nodes.  No skeletal activity. - SBRT to the left supraclavicular lymph node and prevascular mediastinal lymph node from 08/27/2020 through 09/06/2020, 40 Gray in 5 fractions. - CT CAP (10/22/2021): [facility]: No lymphadenopathy.  Cirrhosis.  Intrahepatic and extrahepatic biliary ductal dilatation. - Bone scan (10/28/2021): New focal abnormal tracer uptake at the  inferior aspect of the right scapula. - PSMA PET scan (11/13/2021): Interval development of multifocal tracer avid bone metastasis, only 1 of these lesions is visible on the recent nuclear medicine bone scan dated 10/28/2021.  New tracer avid right axillary, subcarinal, left paratracheal, left retrocrural and upper abdominal lymph nodes.   2.  Androgen deprivation therapy induced bone loss: -Received Zometa every 3 months for a year completed on 10/29/2017.   3.  Genetic testing: -Germline mutation testing was negative.    PLAN:  1.  Metastatic prostate cancer to the left supraclavicular and mediastinal lymph nodes: -He has recent progression of disease based on PSMA PET scan with new bone lesions and multiple lymph nodes. - We discussed starting him on cabazitaxel at lower dose of 15 Mg/m2.  We discussed side effects in detail. - He he travels about an hour to receive treatments.  It would be optimal for him to get Neulasta on pro device placed today along with chemotherapy. - RTC 3 weeks for follow-up.   2.  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   3.  Leg swelling: -Continue Bumex 1.5 tablets daily.   4.  Peripheral neuropathy: -His feet feel cold intermittently with no neuropathic pains.  Closely monitor.  5.  Normocytic anemia: -Hemoglobin today is 11.4.  We will check his ferritin and iron panel and follow-up.   Orders placed this encounter:  Orders Placed This Encounter  Procedures   CBC with Differential   Comprehensive metabolic panel   Magnesium   PSA   Iron and TIBC ([facility] ONLY)   Ferritin     Derek Jack, MD [place] [phone]   I, Thana Ates, am acting as a scribe for Dr. Derek Jack.  I, Derek Jack MD, have reviewed the above documentation for accuracy and completeness, and I agree with the above.

## 2021-12-15 NOTE — Progress Notes (Signed)
Patient tolerated chemotherapy with no complaints voiced. Side effects with management reviewed understanding verbalized. Port site clean and dry with no bruising or swelling noted at site. Good blood return noted before and after administration of chemotherapy. Band aid applied. OnPro education given to patient, and OnPro applied to right arm. Patient left in satisfactory condition with VSS and no s/s of distress noted.

## 2021-12-15 NOTE — Progress Notes (Signed)
Provider requested OnPro; received authorization from PA team; ok to give  TO Dr. Georgina Snell, [facility]

## 2021-12-15 NOTE — Patient Instructions (Addendum)
[facility]  Discharge Instructions: Thank you for choosing [facility] to provide your oncology and hematology care.  If you have a lab appointment with the [facility], please come in thru the Main Entrance and check in at the main information desk.  Wear comfortable clothing and clothing appropriate for easy access to any Portacath or PICC line.   We strive to give you quality time with your provider. You may need to reschedule your appointment if you arrive late (15 or more minutes).  Arriving late affects you and other patients whose appointments are after yours.  Also, if you miss three or more appointments without notifying the office, you may be dismissed from the clinic at the provider's discretion.      For prescription refill requests, have your pharmacy contact our office and allow 72 hours for refills to be completed.    Today you received the following chemotherapy and/or immunotherapy agents Jevtana and Onpro, return as scheduled. Cabazitaxel Injection What is this medication? CABAZITAXEL (ka BAZ i TAX el) treats prostate cancer. It works by slowing down the growth of cancer cells. This medicine may be used for other purposes; ask your health care provider or pharmacist if you have questions. COMMON BRAND NAME(S): Jevtana What should I tell my care team before I take this medication? They need to know if you have any of these conditions: Kidney problems Liver disease Low white blood cell levels Lung disease Stomach or intestine problems An unusual or allergic reaction to cabazitaxel, polysorbate 80, other medications, foods, dyes, or preservatives Pregnant or trying to get pregnant Breast-feeding How should I use this medication? This medication is injected into a vein. It is given by your care team in a hospital or clinic setting. Talk to your care team about the use of this medication in children. Special care may be needed. Overdosage: If  you think you have taken too much of this medicine contact a poison control center or emergency room at once. NOTE: This medicine is only for you. Do not share this medicine with others. What if I miss a dose? Keep appointments for follow-up doses. It is important not to miss your dose. Call your care team if you are unable to keep an appointment. What may interact with this medication? Certain antibiotics, such as clarithromycin or telithromycin Certain antivirals for HIV or AIDS Certain medications for fungal infections like ketoconazole, itraconazole, and voriconazole Nefazodone This list may not describe all possible interactions. Give your health care provider a list of all the medicines, herbs, non-prescription drugs, or dietary supplements you use. Also tell them if you smoke, drink alcohol, or use illegal drugs. Some items may interact with your medicine. What should I watch for while using this medication? This medication may make you feel generally unwell. This is not uncommon as chemotherapy can affect healthy cells as well as cancer cells. Report any side effects. Continue your course of treatment even though you feel ill unless your care team tells you to stop. You may need blood work while you are taking this medication. This medication may increase your risk of getting an infection. Call your care team for advice if you get a fever, chills, sore throat, or other symptoms of a cold or flu. Do not treat yourself. Try to avoid being around people who are sick. Avoid taking medications that contain aspirin, acetaminophen, ibuprofen, naproxen, or ketoprofen unless instructed by your care team. These medications may hide a fever. Be  careful brushing or flossing your teeth or using a toothpick because you may get an infection or bleed more easily. If you have any dental work done, tell your dentist you are receiving this medication. This medication can cause serious infusion reactions. To  reduce the risk, your care team may give you other medications to take before receiving this one. Be sure to follow the directions from your care team. Use a condom during sex and for 3 months after stopping therapy. Tell your care team right away if you think your partner might be pregnant. This medication can cause serious birth defects. This medication may cause infertility. Talk to your care team if you are concerned about your fertility. What side effects may I notice from receiving this medication? Side effects that you should report to your care team as soon as possible: Allergic reactions--skin rash, itching, hives, swelling of the face, lips, tongue, or throat Dry cough, shortness of breath or trouble breathing Infection--fever, chills, cough, or sore throat Kidney injury--decrease in the amount of urine, swelling of the ankles, hands, or feet Low red blood cell level--unusual weakness or fatigue, dizziness, headache, trouble breathing Pain, tingling, or numbness in the hands or feet Red or dark brown urine Sudden or severe stomach pain, bloody diarrhea, fever, nausea, vomiting Unusual bruising or bleeding Side effects that usually do not require medical attention (report to your care team if they continue or are bothersome): Diarrhea Fatigue Loss of appetite Nausea Vomiting This list may not describe all possible side effects. Call your doctor for medical advice about side effects. You may report side effects to FDA at 1-800-FDA-1088. Where should I keep my medication? This medication is given in a hospital or clinic. It will not be stored at home. NOTE: This sheet is a summary. It may not cover all possible information. If you have questions about this medicine, talk to your doctor, pharmacist, or health care provider.  2023 Elsevier/Gold Standard (2021-06-24 00:00:00)    To help prevent nausea and vomiting after your treatment, we encourage you to take your nausea medication  as directed.  BELOW ARE SYMPTOMS THAT SHOULD BE REPORTED IMMEDIATELY: *FEVER GREATER THAN 100.4 F (38 C) OR HIGHER *CHILLS OR SWEATING *NAUSEA AND VOMITING THAT IS NOT CONTROLLED WITH YOUR NAUSEA MEDICATION *UNUSUAL SHORTNESS OF BREATH *UNUSUAL BRUISING OR BLEEDING *URINARY PROBLEMS (pain or burning when urinating, or frequent urination) *BOWEL PROBLEMS (unusual diarrhea, constipation, pain near the anus) TENDERNESS IN MOUTH AND THROAT WITH OR WITHOUT PRESENCE OF ULCERS (sore throat, sores in mouth, or a toothache) UNUSUAL RASH, SWELLING OR PAIN  UNUSUAL VAGINAL DISCHARGE OR ITCHING   Items with * indicate a potential emergency and should be followed up as soon as possible or go to the Emergency Department if any problems should occur.  Please show the CHEMOTHERAPY ALERT CARD or IMMUNOTHERAPY ALERT CARD at check-in to the Emergency Department and triage nurse.  Should you have questions after your visit or need to cancel or reschedule your appointment, please contact [facility] [phone]  and follow the prompts.  Office hours are 8:00 a.m. to 4:30 p.m. Monday - Friday. Please note that voicemails left after 4:00 p.m. may not be returned until the following business day.  We are closed weekends and major holidays. You have access to a nurse at all times for urgent questions. Please call the main number to the clinic [phone] and follow the prompts.  For any non-urgent questions, you may also contact your provider using  MyChart. We now offer e-Visits for anyone 32 and older to request care online for non-urgent symptoms. For details visit [email or web address].   Also download the MyChart app! Go to the app store, search "MyChart", open the app, select [facility], and log in with your MyChart username and password.  Masks are optional in the cancer centers. If you would like for your care team to wear a mask while they are taking care of you, please let them know. For  doctor visits, patients may have with them one support person who is at least 83 years old. At this time, visitors are not allowed in the infusion area.

## 2021-12-16 ENCOUNTER — Telehealth (HOSPITAL_COMMUNITY): Payer: Self-pay

## 2021-12-16 NOTE — Telephone Encounter (Signed)
24 hour follow up call, no answer, left message for them to call back with any questions or concerns.

## 2021-12-17 ENCOUNTER — Ambulatory Visit (HOSPITAL_COMMUNITY): Payer: Medicare PPO

## 2021-12-21 ENCOUNTER — Other Ambulatory Visit: Payer: Self-pay

## 2021-12-22 ENCOUNTER — Other Ambulatory Visit: Payer: Self-pay

## 2021-12-23 ENCOUNTER — Other Ambulatory Visit: Payer: Self-pay

## 2022-01-05 ENCOUNTER — Ambulatory Visit (HOSPITAL_COMMUNITY): Payer: Medicare PPO | Admitting: Hematology

## 2022-01-05 ENCOUNTER — Other Ambulatory Visit (HOSPITAL_COMMUNITY): Payer: Medicare PPO

## 2022-01-05 ENCOUNTER — Ambulatory Visit (HOSPITAL_COMMUNITY): Payer: Medicare PPO

## 2022-01-06 ENCOUNTER — Inpatient Hospital Stay: Payer: Medicare PPO | Attending: Hematology

## 2022-01-06 ENCOUNTER — Inpatient Hospital Stay: Payer: Medicare PPO

## 2022-01-06 ENCOUNTER — Inpatient Hospital Stay (HOSPITAL_BASED_OUTPATIENT_CLINIC_OR_DEPARTMENT_OTHER): Payer: Medicare PPO | Admitting: Hematology

## 2022-01-06 VITALS — BP 135/67 | HR 74 | Temp 97.3°F | Resp 20

## 2022-01-06 DIAGNOSIS — M7989 Other specified soft tissue disorders: Secondary | ICD-10-CM | POA: Insufficient documentation

## 2022-01-06 DIAGNOSIS — D649 Anemia, unspecified: Secondary | ICD-10-CM | POA: Insufficient documentation

## 2022-01-06 DIAGNOSIS — Z79899 Other long term (current) drug therapy: Secondary | ICD-10-CM | POA: Insufficient documentation

## 2022-01-06 DIAGNOSIS — G629 Polyneuropathy, unspecified: Secondary | ICD-10-CM | POA: Insufficient documentation

## 2022-01-06 DIAGNOSIS — Z87891 Personal history of nicotine dependence: Secondary | ICD-10-CM | POA: Insufficient documentation

## 2022-01-06 DIAGNOSIS — C61 Malignant neoplasm of prostate: Secondary | ICD-10-CM | POA: Insufficient documentation

## 2022-01-06 DIAGNOSIS — C7951 Secondary malignant neoplasm of bone: Secondary | ICD-10-CM | POA: Insufficient documentation

## 2022-01-06 DIAGNOSIS — C771 Secondary and unspecified malignant neoplasm of intrathoracic lymph nodes: Secondary | ICD-10-CM | POA: Insufficient documentation

## 2022-01-06 DIAGNOSIS — Z5189 Encounter for other specified aftercare: Secondary | ICD-10-CM | POA: Insufficient documentation

## 2022-01-06 DIAGNOSIS — Z5111 Encounter for antineoplastic chemotherapy: Secondary | ICD-10-CM | POA: Insufficient documentation

## 2022-01-06 LAB — COMPREHENSIVE METABOLIC PANEL
ALT: 12 U/L (ref 0–44)
AST: 14 U/L — ABNORMAL LOW (ref 15–41)
Albumin: 3.9 g/dL (ref 3.5–5.0)
Alkaline Phosphatase: 59 U/L (ref 38–126)
Anion gap: 7 (ref 5–15)
BUN: 19 mg/dL (ref 8–23)
CO2: 27 mmol/L (ref 22–32)
Calcium: 8.9 mg/dL (ref 8.9–10.3)
Chloride: 107 mmol/L (ref 98–111)
Creatinine, Ser: 0.88 mg/dL (ref 0.61–1.24)
GFR, Estimated: >60 mL/min (ref >60)
Glucose, Bld: 130 mg/dL — ABNORMAL HIGH (ref 70–99)
Potassium: 3.7 mmol/L (ref 3.5–5.1)
Sodium: 141 mmol/L (ref 135–145)
Total Bilirubin: 0.5 mg/dL (ref 0.3–1.2)
Total Protein: 6.4 g/dL — ABNORMAL LOW (ref 6.5–8.1)

## 2022-01-06 LAB — CBC WITH DIFFERENTIAL/PLATELET
Abs Immature Granulocytes: 0.03 10*3/uL (ref 0.00–0.07)
Basophils Absolute: 0.1 10*3/uL (ref 0.0–0.1)
Basophils Relative: 1 %
Eosinophils Absolute: 0 10*3/uL (ref 0.0–0.5)
Eosinophils Relative: 0 %
HCT: 34.2 % — ABNORMAL LOW (ref 39.0–52.0)
Hemoglobin: 10.8 g/dL — ABNORMAL LOW (ref 13.0–17.0)
Immature Granulocytes: 0 %
Lymphocytes Relative: 10 %
Lymphs Abs: 0.9 10*3/uL (ref 0.7–4.0)
MCH: 27.6 pg (ref 26.0–34.0)
MCHC: 31.6 g/dL (ref 30.0–36.0)
MCV: 87.5 fL (ref 80.0–100.0)
Monocytes Absolute: 1.1 10*3/uL — ABNORMAL HIGH (ref 0.1–1.0)
Monocytes Relative: 11 %
Neutro Abs: 7 10*3/uL (ref 1.7–7.7)
Neutrophils Relative %: 78 %
Platelets: 357 10*3/uL (ref 150–400)
RBC: 3.91 MIL/uL — ABNORMAL LOW (ref 4.22–5.81)
RDW: 15.8 % — ABNORMAL HIGH (ref 11.5–15.5)
WBC: 9.2 10*3/uL (ref 4.0–10.5)
nRBC: 0 % (ref 0.0–0.2)

## 2022-01-06 LAB — MAGNESIUM: Magnesium: 2 mg/dL (ref 1.7–2.4)

## 2022-01-06 MED ORDER — METFORMIN HCL 500 MG PO TABS
1000.0000 mg | ORAL_TABLET | Freq: Two times a day (BID) | ORAL | 3 refills | Status: DC
Start: 2022-01-06 — End: 2022-01-13

## 2022-01-06 MED ORDER — SODIUM CHLORIDE 0.9 % IV SOLN
8.0000 mg | Freq: Once | INTRAVENOUS | Status: AC
  Administered 2022-01-06: 8 mg via INTRAVENOUS
  Filled 2022-01-06: qty 4

## 2022-01-06 MED ORDER — HEPARIN SOD (PORK) LOCK FLUSH 100 UNIT/ML IV SOLN
500.0000 [IU] | Freq: Once | INTRAVENOUS | Status: AC | PRN
  Administered 2022-01-06: 500 [IU]

## 2022-01-06 MED ORDER — DIPHENHYDRAMINE HCL 50 MG/ML IJ SOLN
INTRAMUSCULAR | Status: AC
  Filled 2022-01-06: qty 1

## 2022-01-06 MED ORDER — DENOSUMAB 120 MG/1.7ML ~~LOC~~ SOLN
SUBCUTANEOUS | Status: AC
  Filled 2022-01-06: qty 1.7

## 2022-01-06 MED ORDER — SODIUM CHLORIDE 0.9 % IV SOLN: Freq: Once | INTRAVENOUS | Status: AC

## 2022-01-06 MED ORDER — SODIUM CHLORIDE 0.9 % IV SOLN
10.0000 mg | Freq: Once | INTRAVENOUS | Status: AC
  Administered 2022-01-06: 10 mg via INTRAVENOUS
  Filled 2022-01-06: qty 10

## 2022-01-06 MED ORDER — FAMOTIDINE IN NACL 20-0.9 MG/50ML-% IV SOLN
20.0000 mg | Freq: Once | INTRAVENOUS | Status: AC
  Administered 2022-01-06: 20 mg via INTRAVENOUS

## 2022-01-06 MED ORDER — DENOSUMAB 120 MG/1.7ML ~~LOC~~ SOLN
120.0000 mg | Freq: Once | SUBCUTANEOUS | Status: AC
  Administered 2022-01-06: 120 mg via SUBCUTANEOUS

## 2022-01-06 MED ORDER — SODIUM CHLORIDE 0.9 % IV SOLN
20.0000 mg/m2 | Freq: Once | INTRAVENOUS | Status: AC
  Administered 2022-01-06: 46 mg via INTRAVENOUS
  Filled 2022-01-06: qty 4.6

## 2022-01-06 MED ORDER — FAMOTIDINE IN NACL 20-0.9 MG/50ML-% IV SOLN
INTRAVENOUS | Status: AC
  Filled 2022-01-06: qty 50

## 2022-01-06 MED ORDER — PEGFILGRASTIM 6 MG/0.6ML ~~LOC~~ PSKT
6.0000 mg | PREFILLED_SYRINGE | Freq: Once | SUBCUTANEOUS | Status: AC
  Administered 2022-01-06: 6 mg via SUBCUTANEOUS
  Filled 2022-01-06: qty 0.6

## 2022-01-06 MED ORDER — DIPHENHYDRAMINE HCL 50 MG/ML IJ SOLN
25.0000 mg | Freq: Once | INTRAMUSCULAR | Status: AC
  Administered 2022-01-06: 25 mg via INTRAVENOUS

## 2022-01-06 MED ORDER — SODIUM CHLORIDE 0.9% FLUSH
10.0000 mL | INTRAVENOUS | Status: DC | PRN
  Administered 2022-01-06: 10 mL

## 2022-01-06 NOTE — Patient Instructions (Signed)
[facility]  Discharge Instructions: Thank you for choosing [facility] to provide your oncology and hematology care.  If you have a lab appointment with the [facility], please come in thru the Main Entrance and check in at the main information desk.  Wear comfortable clothing and clothing appropriate for easy access to any Portacath or PICC line.   We strive to give you quality time with your provider. You may need to reschedule your appointment if you arrive late (15 or more minutes).  Arriving late affects you and other patients whose appointments are after yours.  Also, if you miss three or more appointments without notifying the office, you may be dismissed from the clinic at the provider's discretion.      For prescription refill requests, have your pharmacy contact our office and allow 72 hours for refills to be completed.    Today you received the following chemotherapy and/or immunotherapy agents Jevtana, Onpro and Xgeva, return as scheduled.   To help prevent nausea and vomiting after your treatment, we encourage you to take your nausea medication as directed.  BELOW ARE SYMPTOMS THAT SHOULD BE REPORTED IMMEDIATELY: *FEVER GREATER THAN 100.4 F (38 C) OR HIGHER *CHILLS OR SWEATING *NAUSEA AND VOMITING THAT IS NOT CONTROLLED WITH YOUR NAUSEA MEDICATION *UNUSUAL SHORTNESS OF BREATH *UNUSUAL BRUISING OR BLEEDING *URINARY PROBLEMS (pain or burning when urinating, or frequent urination) *BOWEL PROBLEMS (unusual diarrhea, constipation, pain near the anus) TENDERNESS IN MOUTH AND THROAT WITH OR WITHOUT PRESENCE OF ULCERS (sore throat, sores in mouth, or a toothache) UNUSUAL RASH, SWELLING OR PAIN  UNUSUAL VAGINAL DISCHARGE OR ITCHING   Items with * indicate a potential emergency and should be followed up as soon as possible or go to the Emergency Department if any problems should occur.  Please show the CHEMOTHERAPY ALERT CARD or IMMUNOTHERAPY  ALERT CARD at check-in to the Emergency Department and triage nurse.  Should you have questions after your visit or need to cancel or reschedule your appointment, please contact [facility] [phone]  and follow the prompts.  Office hours are 8:00 a.m. to 4:30 p.m. Monday - Friday. Please note that voicemails left after 4:00 p.m. may not be returned until the following business day.  We are closed weekends and major holidays. You have access to a nurse at all times for urgent questions. Please call the main number to the clinic [phone] and follow the prompts.  For any non-urgent questions, you may also contact your provider using MyChart. We now offer e-Visits for anyone 62 and older to request care online for non-urgent symptoms. For details visit [email or web address].   Also download the MyChart app! Go to the app store, search "MyChart", open the app, select [facility], and log in with your MyChart username and password.  Masks are optional in the cancer centers. If you would like for your care team to wear a mask while they are taking care of you, please let them know. For doctor visits, patients may have with them one support person who is at least 83 years old. At this time, visitors are not allowed in the infusion area.

## 2022-01-06 NOTE — Progress Notes (Signed)
Patient tolerated chemotherapy with no complaints voiced. Side effects with management reviewed understanding verbalized. Port site clean and dry with no bruising or swelling noted at site. Good blood return noted before and after administration of chemotherapy. Band aid applied. Onpro applied with no complaints voiced.  Patient taking calcium as directed. Denied tooth, jaw, and leg pain. No recent or upcoming dental visits. Labs reviewed. Patient tolerated injection with no complaints voiced. See MAR for details. Patient stable during and after injection. Site clean and dry with no bruising or swelling noted. Band aid applied. Vss with discharge and left in satisfactory condition with no s/s of distress.

## 2022-01-06 NOTE — Patient Instructions (Signed)
[facility] at [facility] Discharge Instructions   You were seen and examined today by Dr. Katragadda.  He reviewed the results of your lab work which are normal/stable.   We will proceed with your treatment today.  Return as scheduled.    Thank you for choosing [facility] at [facility] to provide your oncology and hematology care.  To afford each patient quality time with our provider, please arrive at least 15 minutes before your scheduled appointment time.   If you have a lab appointment with the Cancer Center please come in thru the Main Entrance and check in at the main information desk.  You need to re-schedule your appointment should you arrive 10 or more minutes late.  We strive to give you quality time with our providers, and arriving late affects you and other patients whose appointments are after yours.  Also, if you no show three or more times for appointments you may be dismissed from the clinic at the providers discretion.     Again, thank you for choosing [facility].  Our hope is that these requests will decrease the amount of time that you wait before being seen by our physicians.       _____________________________________________________________  Should you have questions after your visit to [facility], please contact our office at [phone] and follow the prompts.  Our office hours are 8:00 a.m. and 4:30 p.m. Monday - Friday.  Please note that voicemails left after 4:00 p.m. may not be returned until the following business day.  We are closed weekends and major holidays.  You do have access to a nurse 24-7, just call the main number to the clinic [phone] and do not press any options, hold on the line and a nurse will answer the phone.    For prescription refill requests, have your pharmacy contact our office and allow 72 hours.    Due to Covid, you will need to wear a mask upon entering  the hospital. If you do not have a mask, a mask will be given to you at the Main Entrance upon arrival. For doctor visits, patients may have 1 support person age 18 or older with them. For treatment visits, patients can not have anyone with them due to social distancing guidelines and our immunocompromised population.      

## 2022-01-06 NOTE — Progress Notes (Signed)
[place], [place] [id number]   CLINIC:  Medical Oncology/Hematology  PCP:  [facility] [address] 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 Docetaxel x 14 cycles from 08/11/2015 through 06/12/2016. 2. Abiraterone from 07/13/2016 through 04/09/2019. 3. Enzalutamide from 04/12/2019 to 11/03/2019.  NGS Results: Foundation 1 MS--stable, TMB 4 Muts/Mb  CURRENT THERAPY: Cabazitaxel + Prednisone q21d  BRIEF ONCOLOGIC HISTORY:  Oncology History  Prostate cancer metastatic to intrathoracic lymph node ([place])  07/24/2015 Initial Diagnosis   Prostate cancer metastatic to the left supraclavicular and anterior mediastinal lymph nodes   08/21/2015 - 06/12/2016 Chemotherapy   Docetaxel every 3 weeks for 14 cycles, discontinued as PSA has plateaued around 11    07/13/2016 Treatment Plan Change   Zytiga 1000 milligrams daily along with prednisone 5 mg daily, started secondary to fluid overload from docetaxel   10/14/2018 Genetic Testing   Negative genetic testing.  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  The Common Hereditary Gene Panel offered by [facility] includes sequencing and/or deletion duplication testing of the following 48 genes: APC, ATM, AXIN2, BARD1, BMPR1A, BRCA1, BRCA2, BRIP1, CDH1, CDK4, CDKN2A (p14ARF), CDKN2A (p16INK4a), CHEK2, CTNNA1, DICER1, EPCAM (Deletion/duplication testing only), GREM1 (promoter region deletion/duplication testing only), KIT, MEN1, MLH1, MSH2, MSH3, MSH6, MUTYH, NBN, NF1, NHTL1, PALB2, PDGFRA, PMS2, POLD1, POLE, PTEN, RAD50, RAD51C, RAD51D, RNF43, SDHB, SDHC, SDHD, SMAD4, SMARCA4. STK11, TP53, TSC1, TSC2, and VHL.  The following genes were evaluated for sequence changes only: SDHA and HOXB13 c.251G>A variant only. The report date is Oct 14, 2018.   09/18/2019 Genetic Testing   Foundation One  CDx      12/12/2019 Genetic Testing   Guardant 360       12/15/2021 -  Chemotherapy   Patient is on Treatment Plan : PROSTATE Cabazitaxel + Prednisone q21d       CANCER STAGING:  Cancer Staging  No matching staging information was found for the patient.  INTERVAL HISTORY:  Mr. MYKAI WENDORF, a 81 y.o. male, returns for follow-up of metastatic prostate cancer.  He has completed cycle 1 of cabazitaxel 3 weeks ago.  Did not experience any major GI side effects.  He is continuing to use elliptical machine and riding a bike.  Feet feel cold all the time and is stable.  Some shortness of breath on exertion from COPD is also stable.  Denies any fevers or infections.   REVIEW OF SYSTEMS:  Review of Systems  Constitutional:  Negative for appetite change and fatigue.  Respiratory:  Positive for cough (COPD).   Neurological:  Positive for numbness.  All other systems reviewed and are negative.   PAST MEDICAL/SURGICAL HISTORY:  Past Medical History:  Diagnosis Date   COPD (chronic obstructive pulmonary disease) ([place])    Diabetes mellitus without complication ([facility])    Family history of ovarian cancer    Family history of prostate cancer    Hypertension    Prostate cancer ([place])    Past Surgical History:  Procedure Laterality Date   GOLD SEED IMPLANT      SOCIAL HISTORY:  Social History   Socioeconomic History   Marital status: Widowed    Spouse name: Not on file   Number of children: Not on file   Years of education: Not on file   Highest education level: Not on file  Occupational History  Not on file  Tobacco Use   Smoking status: Former    Packs/day: 0.25    Years: 60.00    Total pack years: 15.00    Types: Cigarettes    Quit date: 06/25/2014    Years since quitting: 7.5   Smokeless tobacco: Never   Tobacco comments:    smoked since young age, quit in 2016  Substance and Sexual Activity   Alcohol use: Not Currently   Drug use: No   Sexual activity: Not  Currently  Other Topics Concern   Not on file  Social History Narrative   Not on file   Social Determinants of Health   Financial Resource Strain: Low Risk  (05/21/2020)   Overall Financial Resource Strain (CARDIA)    Difficulty of Paying Living Expenses: Not hard at all  Food Insecurity: No Food Insecurity (05/21/2020)   Hunger Vital Sign    Worried About Running Out of Food in the Last Year: Never true    Ran Out of Food in the Last Year: Never true  Transportation Needs: No Transportation Needs (05/21/2020)   PRAPARE - Hydrologist (Medical): No    Lack of Transportation (Non-Medical): No  Physical Activity: Insufficiently Active (05/21/2020)   Exercise Vital Sign    Days of Exercise per Week: 2 days    Minutes of Exercise per Session: 20 min  Stress: No Stress Concern Present (05/21/2020)   [place]    Feeling of Stress : Not at all  Social Connections: Moderately Isolated (05/21/2020)   Social Connection and Isolation Panel [NHANES]    Frequency of Communication with Friends and Family: More than three times a week    Frequency of Social Gatherings with Friends and Family: More than three times a week    Attends Religious Services: 1 to 4 times per year    Active Member of [employer] or Organizations: No    Attends Archivist Meetings: Never    Marital Status: Widowed  Intimate Partner Violence: Not At Risk (05/21/2020)   Humiliation, Afraid, Rape, and Kick questionnaire    Fear of Current or Ex-Partner: No    Emotionally Abused: No    Physically Abused: No    Sexually Abused: No    FAMILY HISTORY:  Family History  Problem Relation Age of Onset   Ovarian cancer Mother 19   Heart attack Father    Prostate cancer Maternal Uncle 62   Cancer Cousin        pat cousin with unknown cancer   Colon cancer Neg Hx    Pancreatic cancer Neg Hx    Breast cancer Neg Hx      CURRENT MEDICATIONS:  Current Outpatient Medications  Medication Sig Dispense Refill   ACCU-CHEK GUIDE test strip      Accu-Chek Softclix Lancets lancets      acetaminophen (TYLENOL) 500 MG tablet Take 500 mg by mouth every 6 (six) hours as needed for moderate pain.     albuterol (ACCUNEB) 0.63 MG/3ML nebulizer solution Take 1 ampule by nebulization every 6 (six) hours as needed for wheezing.     albuterol (PROVENTIL) (2.5 MG/3ML) 0.083% nebulizer solution INHALE 3 ML IN NEBULIZER BY MOUTH FOUR TIMES A DAY AS NEEDED     amLODipine (NORVASC) 2.5 MG tablet Take 1 tablet (2.5 mg total) by mouth daily as needed. 30 tablet 0   ascorbic acid (VITAMIN C) 500 MG tablet Take 1 tablet  by mouth daily.     aspirin 81 MG EC tablet Take 1 tablet by mouth daily.     atorvastatin (LIPITOR) 20 MG tablet Take 1 tablet by mouth daily. 2m     azelastine (ASTELIN) 0.1 % nasal spray USE 2 SPRAYS IN EACH NOSTRIL TWICE A DAY FOR ALLERGIC RHINITIS     benzonatate (TESSALON) 200 MG capsule Take by mouth.     Blood Glucose Monitoring Suppl (ACCU-CHEK GUIDE) w/Device KIT      bumetanide (BUMEX) 1 MG tablet TAKE 1 AND 1/2 TABLETS(1.5 MG) BY MOUTH DAILY 45 tablet 6   CALCIUM PO Take 600 mg by mouth daily.      cetirizine (ZYRTEC) 10 MG tablet Take 1 tablet by mouth daily.     CHERATUSSIN AC 100-10 MG/5ML syrup Take 5 mLs by mouth daily as needed.   0   Cholecalciferol (VITAMIN D-3 PO) Take 1 tablet by mouth daily.     cyclobenzaprine (FLEXERIL) 10 MG tablet Take 10 mg by mouth as needed.   0   dicyclomine (BENTYL) 20 MG tablet Take 1 tablet by mouth 3 (three) times daily as needed.     dorzolamide (TRUSOPT) 2 % ophthalmic solution Place 1 drop into both eyes 3 (three) times daily.     ferrous sulfate 325 (65 FE) MG tablet Take 1 tablet by mouth daily.     fluticasone (FLONASE) 50 MCG/ACT nasal spray USE 2 SPRAY(S) IN EACH NOSTRIL ONCE DAILY FOR 30 DAYS 15.8 mL 12   guaifenesin (HUMIBID E) 400 MG TABS tablet Take  1 tablet by mouth 4 (four) times daily as needed.     ibuprofen (ADVIL,MOTRIN) 600 MG tablet Take 600 mg by mouth every 6 (six) hours as needed.   0   Ipratropium-Albuterol (COMBIVENT) 20-100 MCG/ACT AERS respimat INHALE 1 PUFF BY MOUTH FOUR TIMES A DAY AS NEEDED COPD     latanoprost (XALATAN) 0.005 % ophthalmic solution INSTILL 1 DROP INTO AFFECTED EYE(S) BY OPHTHALMIC ROUTE ONCE DAILY INTHE EVENING     Latanoprostene Bunod (VYZULTA) 0.024 % SOLN Apply to eye.     Leuprolide Acetate, 4 Month, (ELIGARD) 30 MG injection 30 MG SUBCUTANEOUSLY Q90D PRN     losartan (COZAAR) 100 MG tablet Take 1 tablet by mouth daily.     magnesium oxide (MAG-OX) 400 MG tablet Take 1 tablet (400 mg total) by mouth every other day. 90 tablet 2   MAGNESIUM-OXIDE 400 (240 Mg) MG tablet TAKE 1 TABLET BY MOUTH EVERY OTHER DAY 90 tablet 2   montelukast (SINGULAIR) 10 MG tablet Take 10 mg by mouth at bedtime.     potassium chloride (MICRO-K) 10 MEQ CR capsule TAKE 4 CAPSULES BY MOUTH WITH FOOD. OPEN AND SPRINKLE ON FOOD 120 capsule 3   predniSONE (DELTASONE) 5 MG tablet Take 1 tablet (5 mg total) by mouth daily with breakfast. 30 tablet 3   prochlorperazine (COMPAZINE) 10 MG tablet Take 1 tablet (10 mg total) by mouth every 6 (six) hours as needed for nausea or vomiting. 60 tablet 3   sucralfate (CARAFATE) 1 g tablet Take 1 tablet by mouth 4 (four) times daily as needed.     TRELEGY ELLIPTA 100-62.5-25 MCG/INH AEPB INHALE 1 PUFF ONCE DAILY     vitamin C (ASCORBIC ACID) 500 MG tablet Take 500 mg by mouth daily.     metFORMIN (GLUCOPHAGE) 500 MG tablet Take 2 tablets (1,000 mg total) by mouth 2 (two) times daily with a meal. 60 tablet 3  No current facility-administered medications for this visit.   Facility-Administered Medications Ordered in Other Visits  Medication Dose Route Frequency Provider Last Rate Last Admin   denosumab (XGEVA) 120 MG/1.7ML injection            diphenhydrAMINE (BENADRYL) 50 MG/ML injection             famotidine (PEPCID) 20-0.9 MG/50ML-% IVPB            sodium chloride flush (NS) 0.9 % injection 10 mL  10 mL Intracatheter PRN Derek Jack, MD   10 mL at 01/06/22 1320    ALLERGIES:  Allergies  Allergen Reactions   Lisinopril Cough   Metoprolol Other (See Comments) and Cough    Increases frequency of cough Other reaction(s): Other (See Comments) Increases frequency of cough    PHYSICAL EXAM:  Performance status (ECOG): 1 - Symptomatic but completely ambulatory  There were no vitals filed for this visit. Wt Readings from Last 3 Encounters:  01/06/22 240 lb 3.2 oz (109 kg)  12/15/21 241 lb 9.6 oz (109.6 kg)  11/20/21 239 lb 13.8 oz (108.8 kg)   Physical Exam Vitals reviewed.  Constitutional:      Appearance: Normal appearance. He is obese.  Cardiovascular:     Rate and Rhythm: Normal rate and regular rhythm.     Pulses: Normal pulses.     Heart sounds: Normal heart sounds.  Pulmonary:     Effort: Pulmonary effort is normal.     Breath sounds: Normal breath sounds.  Neurological:     General: No focal deficit present.     Mental Status: He is alert and oriented to person, place, and time.  Psychiatric:        Mood and Affect: Mood normal.        Behavior: Behavior normal.     LABORATORY DATA:  I have reviewed the labs as listed.     Latest Ref Rng & Units 01/06/2022    9:35 AM 12/15/2021    8:47 AM 10/15/2021    1:44 PM  CBC  WBC 4.0 - 10.5 K/uL 9.2  9.8  10.2   Hemoglobin 13.0 - 17.0 g/dL 10.8  11.4  10.7   Hematocrit 39.0 - 52.0 % 34.2  35.3  33.8   Platelets 150 - 400 K/uL 357  353  345       Latest Ref Rng & Units 01/06/2022    9:35 AM 12/15/2021    8:47 AM 10/15/2021    1:44 PM  CMP  Glucose 70 - 99 mg/dL 130  149  143   BUN 8 - 23 mg/dL _0 Creatinine 0.61 - 1.24 mg/dL 0.88  0.98  1.01   Sodium 135 - 145 mmol/L 141  140  137   Potassium 3.5 - 5.1 mmol/L 3.7  3.6  4.1   Chloride 98 - 111 mmol/L 107  104  105   CO2 22 - 32 mmol/L  _1 Calcium 8.9 - 10.3 mg/dL 8.9  9.3  9.2   Total Protein 6.5 - 8.1 g/dL 6.4  6.8  6.6   Total Bilirubin 0.3 - 1.2 mg/dL 0.5  0.8  0.9   Alkaline Phos 38 - 126 U/L 59  60  54   AST 15 - 41 U/L _2 ALT 0 - 44 U/L _3 DIAGNOSTIC IMAGING:  I have  independently reviewed the scans and discussed with the patient. No results found.   ASSESSMENT:  1.  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 -Abiraterone and prednisone from 07/13/2016 through 04/09/2019, discontinued secondary to PSA progression. -Enzalutamide from 04/12/2019 through 11/03/2019 with progression. -Last Lupron on 06/09/2019. -CT CAP on 11/10/2019 shows no findings of active malignancy.  Stable small sclerotic lesions at T4 and T10, nonspecific.  Right hydronephrosis with right proximal hydroureter extending down to a 0.8 x 0.8 x 0.4 cm proximal ureteral stone at L3 vertical level.  Nonobstructive right and possibly left nephrolithiasis. -Bone scan on 11/10/2019 shows right proximal tibial metaphyseal activity from recent trauma.  Degenerative findings in the spine, right sternoclavicular joint, shoulders and right foot.  No evidence of metastatic disease. -Foundation 1 test shows MS-stable.  AR amplification.  Sensitivity is reduced due to sample quality. -Guardant 360 on 12/12/2019 MSI high not detected.  TMB was not evaluable.  No other targetable mutations. -F-18-PYLARIFY PET scan showed intense radiotracer activity in the left supraclavicular and prevascular lymph nodes, measuring 10 mm.  Cluster of small prevascular lymph nodes measures 5-7 mm each with SUV 54.  AP window lymph node measuring 8 mm.  No activity in the prostate gland or abdominal or pelvic lymph nodes.  No skeletal activity. - SBRT to the left supraclavicular lymph node and prevascular mediastinal  lymph node from 08/27/2020 through 09/06/2020, 40 Gray in 5 fractions. - CT CAP (10/22/2021): Done at [facility]: No lymphadenopathy.  Cirrhosis.  Intrahepatic and extrahepatic biliary ductal dilatation. - Bone scan (10/28/2021): New focal abnormal tracer uptake at the inferior aspect of the right scapula. - PSMA PET scan (11/13/2021): Interval development of multifocal tracer avid bone metastasis, only 1 of these lesions is visible on the recent nuclear medicine bone scan dated 10/28/2021.  New tracer avid right axillary, subcarinal, left paratracheal, left retrocrural and upper abdominal lymph nodes. - Cycle 1 of cabazitaxel on 12/15/2021, cycle 2 on 01/06/2022   2.  Androgen deprivation therapy induced bone loss: -Received Zometa every 3 months for a year completed on 10/29/2017.   3.  Genetic testing: -Germline mutation testing was negative.    PLAN:  1.  Metastatic prostate cancer to the left supraclavicular and mediastinal lymph nodes: -He has tolerated cabazitaxel at lower dose of 15 mg per metered squared very well.  No GI side effects. - Reviewed labs today which showed normal LFTs.  CBC was grossly normal with normal white count and platelet count.  PSA was 56. - I have recommended increasing cabazitaxel to 20 mg per metered square.  RTC 3 weeks for follow-up.  We will check PSA at that time.  Scans after 3 cycles.   2.  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  We discussed side effects.   3.  Leg swelling: - Continue Bumex 1 and half tablets daily.   4.  Peripheral neuropathy: - Feet feel cold intermittently with no neuropathic pains.  Stable.  5.  Normocytic anemia: - Hemoglobin today is 10.8, myelosuppression.  Ferritin was 117 and percent saturation 10.   Orders placed this encounter:  No orders of the defined types were placed in this encounter.    Derek Jack, MD [place] [phone]

## 2022-01-06 NOTE — Progress Notes (Signed)
Patients port flushed without difficulty.  Good blood return noted with no bruising or swelling noted at site.  Stable during access and blood draw.  Patient to remain accessed for treatment. 

## 2022-01-06 NOTE — Progress Notes (Signed)
Jevtana dose increase to 20 mg/m2 today.  T.O. Dr Rhys Martini, PharmD

## 2022-01-13 ENCOUNTER — Ambulatory Visit (HOSPITAL_COMMUNITY): Payer: Medicare PPO | Admitting: Hematology

## 2022-01-13 ENCOUNTER — Other Ambulatory Visit (HOSPITAL_COMMUNITY): Payer: Medicare PPO

## 2022-01-13 ENCOUNTER — Other Ambulatory Visit: Payer: Self-pay | Admitting: *Deleted

## 2022-01-13 MED ORDER — METFORMIN HCL 500 MG PO TABS: 1000.0000 mg | ORAL_TABLET | Freq: Two times a day (BID) | ORAL | 3 refills | Status: DC

## 2022-01-23 ENCOUNTER — Other Ambulatory Visit: Payer: Self-pay

## 2022-01-26 ENCOUNTER — Other Ambulatory Visit: Payer: Self-pay | Admitting: Hematology

## 2022-01-26 DIAGNOSIS — C771 Secondary and unspecified malignant neoplasm of intrathoracic lymph nodes: Secondary | ICD-10-CM

## 2022-01-27 ENCOUNTER — Other Ambulatory Visit: Payer: Self-pay | Admitting: Hematology

## 2022-01-28 ENCOUNTER — Inpatient Hospital Stay: Payer: Medicare PPO | Attending: Hematology

## 2022-01-28 ENCOUNTER — Inpatient Hospital Stay (HOSPITAL_BASED_OUTPATIENT_CLINIC_OR_DEPARTMENT_OTHER): Payer: Medicare PPO | Admitting: Hematology

## 2022-01-28 ENCOUNTER — Inpatient Hospital Stay: Payer: Medicare PPO

## 2022-01-28 VITALS — BP 145/74 | HR 75 | Temp 98.5°F | Resp 18 | Ht 68.0 in | Wt 240.8 lb

## 2022-01-28 VITALS — BP 129/79 | HR 84 | Temp 98.5°F | Resp 17

## 2022-01-28 DIAGNOSIS — Z5189 Encounter for other specified aftercare: Secondary | ICD-10-CM | POA: Insufficient documentation

## 2022-01-28 DIAGNOSIS — Z79818 Long term (current) use of other agents affecting estrogen receptors and estrogen levels: Secondary | ICD-10-CM | POA: Insufficient documentation

## 2022-01-28 DIAGNOSIS — C7951 Secondary malignant neoplasm of bone: Secondary | ICD-10-CM | POA: Diagnosis present

## 2022-01-28 DIAGNOSIS — Z5111 Encounter for antineoplastic chemotherapy: Secondary | ICD-10-CM | POA: Insufficient documentation

## 2022-01-28 DIAGNOSIS — C771 Secondary and unspecified malignant neoplasm of intrathoracic lymph nodes: Secondary | ICD-10-CM

## 2022-01-28 DIAGNOSIS — G629 Polyneuropathy, unspecified: Secondary | ICD-10-CM | POA: Insufficient documentation

## 2022-01-28 DIAGNOSIS — Z79899 Other long term (current) drug therapy: Secondary | ICD-10-CM | POA: Insufficient documentation

## 2022-01-28 DIAGNOSIS — M7989 Other specified soft tissue disorders: Secondary | ICD-10-CM | POA: Diagnosis not present

## 2022-01-28 DIAGNOSIS — C61 Malignant neoplasm of prostate: Secondary | ICD-10-CM | POA: Diagnosis present

## 2022-01-28 DIAGNOSIS — D649 Anemia, unspecified: Secondary | ICD-10-CM | POA: Diagnosis not present

## 2022-01-28 LAB — CBC WITH DIFFERENTIAL/PLATELET
Abs Immature Granulocytes: 0.07 10*3/uL (ref 0.00–0.07)
Basophils Absolute: 0.1 10*3/uL (ref 0.0–0.1)
Basophils Relative: 1 %
Eosinophils Absolute: 0.1 10*3/uL (ref 0.0–0.5)
Eosinophils Relative: 1 %
HCT: 34 % — ABNORMAL LOW (ref 39.0–52.0)
Hemoglobin: 10.9 g/dL — ABNORMAL LOW (ref 13.0–17.0)
Immature Granulocytes: 1 %
Lymphocytes Relative: 8 %
Lymphs Abs: 0.8 10*3/uL (ref 0.7–4.0)
MCH: 28.4 pg (ref 26.0–34.0)
MCHC: 32.1 g/dL (ref 30.0–36.0)
MCV: 88.5 fL (ref 80.0–100.0)
Monocytes Absolute: 1.1 10*3/uL — ABNORMAL HIGH (ref 0.1–1.0)
Monocytes Relative: 10 %
Neutro Abs: 8.5 10*3/uL — ABNORMAL HIGH (ref 1.7–7.7)
Neutrophils Relative %: 79 %
Platelets: 349 10*3/uL (ref 150–400)
RBC: 3.84 MIL/uL — ABNORMAL LOW (ref 4.22–5.81)
RDW: 16.8 % — ABNORMAL HIGH (ref 11.5–15.5)
WBC: 10.6 10*3/uL — ABNORMAL HIGH (ref 4.0–10.5)
nRBC: 0 % (ref 0.0–0.2)

## 2022-01-28 LAB — MAGNESIUM: Magnesium: 1.9 mg/dL (ref 1.7–2.4)

## 2022-01-28 LAB — COMPREHENSIVE METABOLIC PANEL
ALT: 13 U/L (ref 0–44)
AST: 15 U/L (ref 15–41)
Albumin: 3.7 g/dL (ref 3.5–5.0)
Alkaline Phosphatase: 56 U/L (ref 38–126)
Anion gap: 9 (ref 5–15)
BUN: 15 mg/dL (ref 8–23)
CO2: 26 mmol/L (ref 22–32)
Calcium: 8.5 mg/dL — ABNORMAL LOW (ref 8.9–10.3)
Chloride: 104 mmol/L (ref 98–111)
Creatinine, Ser: 0.94 mg/dL (ref 0.61–1.24)
GFR, Estimated: >60 mL/min (ref >60)
Glucose, Bld: 164 mg/dL — ABNORMAL HIGH (ref 70–99)
Potassium: 3.5 mmol/L (ref 3.5–5.1)
Sodium: 139 mmol/L (ref 135–145)
Total Bilirubin: 0.8 mg/dL (ref 0.3–1.2)
Total Protein: 6.2 g/dL — ABNORMAL LOW (ref 6.5–8.1)

## 2022-01-28 LAB — PSA: Prostatic Specific Antigen: 26.48 ng/mL — ABNORMAL HIGH (ref 0.00–4.00)

## 2022-01-28 MED ORDER — SODIUM CHLORIDE 0.9 % IV SOLN
8.0000 mg | Freq: Once | INTRAVENOUS | Status: AC
  Administered 2022-01-28: 8 mg via INTRAVENOUS
  Filled 2022-01-28: qty 4

## 2022-01-28 MED ORDER — SODIUM CHLORIDE 0.9 % IV SOLN
10.0000 mg | Freq: Once | INTRAVENOUS | Status: AC
  Administered 2022-01-28: 10 mg via INTRAVENOUS
  Filled 2022-01-28: qty 1

## 2022-01-28 MED ORDER — FAMOTIDINE IN NACL 20-0.9 MG/50ML-% IV SOLN
20.0000 mg | Freq: Once | INTRAVENOUS | Status: AC
  Administered 2022-01-28: 20 mg via INTRAVENOUS
  Filled 2022-01-28: qty 50

## 2022-01-28 MED ORDER — SODIUM CHLORIDE 0.9 % IV SOLN: Freq: Once | INTRAVENOUS | Status: AC

## 2022-01-28 MED ORDER — PEGFILGRASTIM 6 MG/0.6ML ~~LOC~~ PSKT
6.0000 mg | PREFILLED_SYRINGE | Freq: Once | SUBCUTANEOUS | Status: AC
  Administered 2022-01-28: 6 mg via SUBCUTANEOUS
  Filled 2022-01-28: qty 0.6

## 2022-01-28 MED ORDER — LEUPROLIDE ACETATE (6 MONTH) 45 MG ~~LOC~~ KIT
45.0000 mg | PACK | Freq: Once | SUBCUTANEOUS | Status: AC
  Administered 2022-01-28: 45 mg via SUBCUTANEOUS
  Filled 2022-01-28: qty 45

## 2022-01-28 MED ORDER — DIPHENHYDRAMINE HCL 50 MG/ML IJ SOLN
25.0000 mg | Freq: Once | INTRAMUSCULAR | Status: AC
  Administered 2022-01-28: 25 mg via INTRAVENOUS
  Filled 2022-01-28: qty 1

## 2022-01-28 MED ORDER — HEPARIN SOD (PORK) LOCK FLUSH 100 UNIT/ML IV SOLN
500.0000 [IU] | Freq: Once | INTRAVENOUS | Status: AC | PRN
  Administered 2022-01-28: 500 [IU]

## 2022-01-28 MED ORDER — SODIUM CHLORIDE 0.9% FLUSH
10.0000 mL | INTRAVENOUS | Status: DC | PRN
  Administered 2022-01-28: 10 mL

## 2022-01-28 MED ORDER — SODIUM CHLORIDE 0.9 % IV SOLN
20.0000 mg/m2 | Freq: Once | INTRAVENOUS | Status: AC
  Administered 2022-01-28: 45 mg via INTRAVENOUS
  Filled 2022-01-28: qty 4.5

## 2022-01-28 NOTE — Progress Notes (Signed)
Andre [name], [place] [id number]   CLINIC:  Medical Oncology/Hematology  PCP:  Clinic, Andre [name] [address] 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 Docetaxel x 14 cycles from 08/11/2015 through 06/12/2016. 2. Abiraterone from 07/13/2016 through 04/09/2019. 3. Enzalutamide from 04/12/2019 to 11/03/2019.  NGS Results: Foundation 1 MS--stable, TMB 4 Muts/Mb  CURRENT THERAPY: Cabazitaxel + Prednisone q21d  BRIEF ONCOLOGIC HISTORY:  Oncology History  Prostate cancer metastatic to intrathoracic lymph node ([facility] [place])  07/24/2015 Initial Diagnosis   Prostate cancer metastatic to the left supraclavicular and anterior mediastinal lymph nodes   08/21/2015 - 06/12/2016 Chemotherapy   Docetaxel every 3 weeks for 14 cycles, discontinued as PSA has plateaued around 11    07/13/2016 Treatment Plan Change   Zytiga 1000 milligrams daily along with prednisone 5 mg daily, started secondary to fluid overload from docetaxel   10/14/2018 Genetic Testing   Negative genetic testing.  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  The Common Hereditary Gene Panel offered by [facility] includes sequencing and/or deletion duplication testing of the following 48 genes: APC, ATM, AXIN2, BARD1, BMPR1A, BRCA1, BRCA2, BRIP1, CDH1, CDK4, CDKN2A (p14ARF), CDKN2A (p16INK4a), CHEK2, CTNNA1, DICER1, EPCAM (Deletion/duplication testing only), GREM1 (promoter region deletion/duplication testing only), KIT, MEN1, MLH1, MSH2, MSH3, MSH6, MUTYH, NBN, NF1, NHTL1, PALB2, PDGFRA, PMS2, POLD1, POLE, PTEN, RAD50, RAD51C, RAD51D, RNF43, SDHB, SDHC, SDHD, SMAD4, SMARCA4. STK11, TP53, TSC1, TSC2, and VHL.  The following genes were evaluated for sequence changes only: SDHA and HOXB13 c.251G>A variant only. The report date is Oct 14, 2018.   09/18/2019 Genetic Testing   Foundation One  CDx      12/12/2019 Genetic Testing   Guardant 360       12/15/2021 - 01/06/2022 Chemotherapy   Patient is on Treatment Plan : PROSTATE Cabazitaxel + Prednisone q21d     12/15/2021 -  Chemotherapy   Patient is on Treatment Plan : PROSTATE Cabazitaxel (20) D1 + Prednisone D1-21 q21d       CANCER STAGING:  Cancer Staging  No matching staging information was found for the patient.  INTERVAL HISTORY:  [name], a 81 y.o. male, returns for follow-up of metastatic prostate cancer and toxicity assessment prior to cycle 3 of cabazitaxel.  He did not experience any major side effects after last treatment.  He reported slightly more fatigue as we increase the dose during cycle 2.  Neuropathy symptoms are stable in the feet and legs.  He is continuing to take diuretic 1 and half tablet daily.   REVIEW OF SYSTEMS:  Review of Systems  Constitutional:  Negative for appetite change and fatigue.  Respiratory:  Positive for cough (COPD).   Neurological:  Positive for numbness.  All other systems reviewed and are negative.   PAST MEDICAL/SURGICAL HISTORY:  Past Medical History:  Diagnosis Date   COPD (chronic obstructive pulmonary disease) ([facility])    Diabetes mellitus without complication (H[facility])    Family history of ovarian cancer    Family history of prostate cancer    Hypertension    Prostate cancer ([facility])    Past Surgical History:  Procedure Laterality Date   GOLD SEED IMPLANT      SOCIAL HISTORY:  Social History   Socioeconomic History   Marital status: Widowed    Spouse name: Not on file   Number of children: Not on file  Years of education: Not on file   Highest education level: Not on file  Occupational History   Not on file  Tobacco Use   Smoking status: Former    Packs/day: 0.25    Years: 60.00    Total pack years: 15.00    Types: Cigarettes    Quit date: 06/25/2014    Years since quitting: 7.6   Smokeless tobacco: Never   Tobacco comments:    smoked  since young age, quit in 2016  Substance and Sexual Activity   Alcohol use: Not Currently   Drug use: No   Sexual activity: Not Currently  Other Topics Concern   Not on file  Social History Narrative   Not on file   Social Determinants of Health   Financial Resource Strain: Low Risk  (05/21/2020)   Overall Financial Resource Strain (CARDIA)    Difficulty of Paying Living Expenses: Not hard at all  Food Insecurity: No Food Insecurity (05/21/2020)   Hunger Vital Sign    Worried About Running Out of Food in the Last Year: Never true    [place] in the Last Year: Never true  Transportation Needs: No Transportation Needs (05/21/2020)   PRAPARE - Hydrologist (Medical): No    Lack of Transportation (Non-Medical): No  Physical Activity: Insufficiently Active (05/21/2020)   Exercise Vital Sign    Days of Exercise per Week: 2 days    Minutes of Exercise per Session: 20 min  Stress: No Stress Concern Present (05/21/2020)   [place]    Feeling of Stress : Not at all  Social Connections: Moderately Isolated (05/21/2020)   Social Connection and Isolation Panel [NHANES]    Frequency of Communication with Friends and Family: More than three times a week    Frequency of Social Gatherings with Friends and Family: More than three times a week    Attends Religious Services: 1 to 4 times per year    Active Member of [employer] or Organizations: No    Attends Archivist Meetings: Never    Marital Status: Widowed  Intimate Partner Violence: Not At Risk (05/21/2020)   Humiliation, Afraid, Rape, and Kick questionnaire    Fear of Current or Ex-Partner: No    Emotionally Abused: No    Physically Abused: No    Sexually Abused: No    FAMILY HISTORY:  Family History  Problem Relation Age of Onset   Ovarian cancer Mother 7   Heart attack Father    Prostate cancer Maternal Uncle 20    Cancer Cousin        pat cousin with unknown cancer   Colon cancer Neg Hx    Pancreatic cancer Neg Hx    Breast cancer Neg Hx     CURRENT MEDICATIONS:  Current Outpatient Medications  Medication Sig Dispense Refill   ACCU-CHEK GUIDE test strip      Accu-Chek Softclix Lancets lancets      acetaminophen (TYLENOL) 500 MG tablet Take 500 mg by mouth every 6 (six) hours as needed for moderate pain.     albuterol (ACCUNEB) 0.63 MG/3ML nebulizer solution Take 1 ampule by nebulization every 6 (six) hours as needed for wheezing.     albuterol (PROVENTIL) (2.5 MG/3ML) 0.083% nebulizer solution INHALE 3 ML IN NEBULIZER BY MOUTH FOUR TIMES A DAY AS NEEDED     amLODipine (NORVASC) 2.5 MG tablet Take 1 tablet (2.5 mg total) by  mouth daily as needed. 30 tablet 0   aspirin 81 MG EC tablet Take 1 tablet by mouth daily.     atorvastatin (LIPITOR) 10 MG tablet TAKE 1 TABLET BY MOUTH EVERY DAY 90 tablet 3   azelastine (ASTELIN) 0.1 % nasal spray USE 2 SPRAYS IN EACH NOSTRIL TWICE A DAY FOR ALLERGIC RHINITIS     benzonatate (TESSALON) 200 MG capsule Take by mouth.     Blood Glucose Monitoring Suppl (ACCU-CHEK GUIDE) w/Device KIT      bumetanide (BUMEX) 1 MG tablet TAKE 1 AND 1/2 TABLETS(1.5 MG) BY MOUTH DAILY 45 tablet 6   CALCIUM PO Take 600 mg by mouth daily.      cetirizine (ZYRTEC) 10 MG tablet Take 1 tablet by mouth daily.     CHERATUSSIN AC 100-10 MG/5ML syrup Take 5 mLs by mouth daily as needed.   0   Cholecalciferol (VITAMIN D-3 PO) Take 1 tablet by mouth daily.     cyclobenzaprine (FLEXERIL) 10 MG tablet Take 10 mg by mouth as needed.   0   dicyclomine (BENTYL) 20 MG tablet Take 1 tablet by mouth 3 (three) times daily as needed.     dorzolamide (TRUSOPT) 2 % ophthalmic solution Place 1 drop into both eyes 3 (three) times daily.     ferrous sulfate 325 (65 FE) MG tablet Take 1 tablet by mouth daily.     fluticasone (FLONASE) 50 MCG/ACT nasal spray USE 2 SPRAY(S) IN EACH NOSTRIL ONCE DAILY FOR 30  DAYS 15.8 mL 12   guaifenesin (HUMIBID E) 400 MG TABS tablet Take 1 tablet by mouth 4 (four) times daily as needed.     ibuprofen (ADVIL,MOTRIN) 600 MG tablet Take 600 mg by mouth every 6 (six) hours as needed.   0   Ipratropium-Albuterol (COMBIVENT) 20-100 MCG/ACT AERS respimat INHALE 1 PUFF BY MOUTH FOUR TIMES A DAY AS NEEDED COPD     latanoprost (XALATAN) 0.005 % ophthalmic solution INSTILL 1 DROP INTO AFFECTED EYE(S) BY OPHTHALMIC ROUTE ONCE DAILY INTHE EVENING     Latanoprostene Bunod (VYZULTA) 0.024 % SOLN Apply to eye.     Leuprolide Acetate, 4 Month, (ELIGARD) 30 MG injection 30 MG SUBCUTANEOUSLY Q90D PRN     losartan (COZAAR) 100 MG tablet Take 1 tablet by mouth daily.     magnesium oxide (MAG-OX) 400 MG tablet Take 1 tablet (400 mg total) by mouth every other day. 90 tablet 2   MAGNESIUM-OXIDE 400 (240 Mg) MG tablet TAKE 1 TABLET BY MOUTH EVERY OTHER DAY 90 tablet 2   metFORMIN (GLUCOPHAGE) 500 MG tablet Take 2 tablets (1,000 mg total) by mouth 2 (two) times daily with a meal. 120 tablet 3   montelukast (SINGULAIR) 10 MG tablet Take 10 mg by mouth at bedtime.     potassium chloride (MICRO-K) 10 MEQ CR capsule TAKE 4 CAPSULES BY MOUTH WITH FOOD. OPEN AND SPRINKLE ON FOOD 120 capsule 3   predniSONE (DELTASONE) 5 MG tablet Take 1 tablet (5 mg total) by mouth daily with breakfast. 30 tablet 3   prochlorperazine (COMPAZINE) 10 MG tablet Take 1 tablet (10 mg total) by mouth every 6 (six) hours as needed for nausea or vomiting. 60 tablet 3   sucralfate (CARAFATE) 1 g tablet Take 1 tablet by mouth 4 (four) times daily as needed.     TRELEGY ELLIPTA 100-62.5-25 MCG/INH AEPB INHALE 1 PUFF ONCE DAILY     vitamin C (ASCORBIC ACID) 500 MG tablet Take 500 mg by mouth daily.  No current facility-administered medications for this visit.   Facility-Administered Medications Ordered in Other Visits  Medication Dose Route Frequency Provider Last Rate Last Admin   denosumab (XGEVA) 120 MG/1.7ML  injection            diphenhydrAMINE (BENADRYL) 50 MG/ML injection            famotidine (PEPCID) 20-0.9 MG/50ML-% IVPB            sodium chloride flush (NS) 0.9 % injection 10 mL  10 mL Intracatheter PRN Derek Jack, MD   10 mL at 01/28/22 1433    ALLERGIES:  Allergies  Allergen Reactions   Lisinopril Cough   Metoprolol Other (See Comments) and Cough    Increases frequency of cough Other reaction(s): Other (See Comments) Increases frequency of cough    PHYSICAL EXAM:  Performance status (ECOG): 1 - Symptomatic but completely ambulatory  Vitals:   01/28/22 1044  BP: (!) 145/74  Pulse: 75  Resp: 18  Temp: 98.5 F (36.9 C)  SpO2: 93%   Wt Readings from Last 3 Encounters:  01/28/22 240 lb 12.8 oz (109.2 kg)  01/06/22 240 lb 3.2 oz (109 kg)  12/15/21 241 lb 9.6 oz (109.6 kg)   Physical Exam Vitals reviewed.  Constitutional:      Appearance: Normal appearance. He is obese.  Cardiovascular:     Rate and Rhythm: Normal rate and regular rhythm.     Pulses: Normal pulses.     Heart sounds: Normal heart sounds.  Pulmonary:     Effort: Pulmonary effort is normal.     Breath sounds: Normal breath sounds.  Neurological:     General: No focal deficit present.     Mental Status: He is alert and oriented to person, place, and time.  Psychiatric:        Mood and Affect: Mood normal.        Behavior: Behavior normal.     LABORATORY DATA:  I have reviewed the labs as listed.     Latest Ref Rng & Units 01/28/2022    9:26 AM 01/06/2022    9:35 AM 12/15/2021    8:47 AM  CBC  WBC 4.0 - 10.5 K/uL 10.6  9.2  9.8   Hemoglobin 13.0 - 17.0 g/dL 10.9  10.8  11.4   Hematocrit 39.0 - 52.0 % 34.0  34.2  35.3   Platelets 150 - 400 K/uL 349  357  353       Latest Ref Rng & Units 01/28/2022    9:26 AM 01/06/2022    9:35 AM 12/15/2021    8:47 AM  CMP  Glucose 70 - 99 mg/dL 164  130  149   BUN 8 - 23 mg/dL 15  19  16    Creatinine 0.61 - 1.24 mg/dL 0.94  0.88  0.98   Sodium 135  - 145 mmol/L 139  141  140   Potassium 3.5 - 5.1 mmol/L 3.5  3.7  3.6   Chloride 98 - 111 mmol/L 104  107  104   CO2 22 - 32 mmol/L 26  27  28    Calcium 8.9 - 10.3 mg/dL 8.5  8.9  9.3   Total Protein 6.5 - 8.1 g/dL 6.2  6.4  6.8   Total Bilirubin 0.3 - 1.2 mg/dL 0.8  0.5  0.8   Alkaline Phos 38 - 126 U/L 56  59  60   AST 15 - 41 U/L 15  14  14    ALT  0 - 44 U/L 13  12  10      DIAGNOSTIC IMAGING:  I have independently reviewed the scans and discussed with the patient. No results found.   ASSESSMENT:  1.  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 -Abiraterone and prednisone from 07/13/2016 through 04/09/2019, discontinued secondary to PSA progression. -Enzalutamide from 04/12/2019 through 11/03/2019 with progression. -Last Lupron on 06/09/2019. -CT CAP on 11/10/2019 shows no findings of active malignancy.  Stable small sclerotic lesions at T4 and T10, nonspecific.  Right hydronephrosis with right proximal hydroureter extending down to a 0.8 x 0.8 x 0.4 cm proximal ureteral stone at L3 vertical level.  Nonobstructive right and possibly left nephrolithiasis. -Bone scan on 11/10/2019 shows right proximal tibial metaphyseal activity from recent trauma.  Degenerative findings in the spine, right sternoclavicular joint, shoulders and right foot.  No evidence of metastatic disease. -Foundation 1 test shows MS-stable.  AR amplification.  Sensitivity is reduced due to sample quality. -Guardant 360 on 12/12/2019 MSI high not detected.  TMB was not evaluable.  No other targetable mutations. -F-18-PYLARIFY PET scan showed intense radiotracer activity in the left supraclavicular and prevascular lymph nodes, measuring 10 mm.  Cluster of small prevascular lymph nodes measures 5-7 mm each with SUV 54.  AP window lymph node measuring 8 mm.  No activity in the prostate gland  or abdominal or pelvic lymph nodes.  No skeletal activity. - SBRT to the left supraclavicular lymph node and prevascular mediastinal lymph node from 08/27/2020 through 09/06/2020, 40 Gray in 5 fractions. - CT CAP (10/22/2021): Done at [facility]: No lymphadenopathy.  Cirrhosis.  Intrahepatic and extrahepatic biliary ductal dilatation. - Bone scan (10/28/2021): New focal abnormal tracer uptake at the inferior aspect of the right scapula. - PSMA PET scan (11/13/2021): Interval development of multifocal tracer avid bone metastasis, only 1 of these lesions is visible on the recent nuclear medicine bone scan dated 10/28/2021.  New tracer avid right axillary, subcarinal, left paratracheal, left retrocrural and upper abdominal lymph nodes. - Cycle 1 of cabazitaxel on 12/15/2021, cycle 2 on 01/06/2022   2.  Androgen deprivation therapy induced bone loss: -Received Zometa every 3 months for a year completed on 10/29/2017.   3.  Genetic testing: -Germline mutation testing was negative.    PLAN:  1.  Metastatic prostate cancer to the left supraclavicular and mediastinal lymph nodes: - He has tolerated cycle 2 of cabazitaxel at 20 mg per metered squared dose reasonably well.  He had experienced slightly more fatigue.  Neuropathy is stable. - Reviewed labs today which showed normal LFTs.  CBC was grossly normal.  PSA has improved to 26 from 56 on 12/15/2021. - Proceed with cycle 3 today.  RTC 3 to 4 weeks for follow-up.  As PSA is trending down, will consider delaying restaging scans until after cycle 6.   2.  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   3.  Leg swelling: - Can continue Bumex 1 and half tablets daily.   4.  Peripheral neuropathy: - Feet feeling cold with no neuropathic pains is stable.  5.  Normocytic anemia: - Globin is 10.9 from myelosuppression.  Closely monitor.   Orders placed this encounter:  Orders Placed This  Encounter  Procedures   CT CHEST Pike,  MD [place] [phone]

## 2022-01-28 NOTE — Patient Instructions (Addendum)
[facility] at [facility] Discharge Instructions   You were seen and examined today by Dr. Katragadda.  He reviewed the results of your lab work which are normal/stable.   We will proceed with your treatment today.  Return as scheduled in 3 weeks.    Thank you for choosing [facility] at [facility] to provide your oncology and hematology care.  To afford each patient quality time with our provider, please arrive at least 15 minutes before your scheduled appointment time.   If you have a lab appointment with the Cancer Center please come in thru the Main Entrance and check in at the main information desk.  You need to re-schedule your appointment should you arrive 10 or more minutes late.  We strive to give you quality time with our providers, and arriving late affects you and other patients whose appointments are after yours.  Also, if you no show three or more times for appointments you may be dismissed from the clinic at the providers discretion.     Again, thank you for choosing [facility].  Our hope is that these requests will decrease the amount of time that you wait before being seen by our physicians.       _____________________________________________________________  Should you have questions after your visit to [facility], please contact our office at [phone] and follow the prompts.  Our office hours are 8:00 a.m. and 4:30 p.m. Monday - Friday.  Please note that voicemails left after 4:00 p.m. may not be returned until the following business day.  We are closed weekends and major holidays.  You do have access to a nurse 24-7, just call the main number to the clinic [phone] and do not press any options, hold on the line and a nurse will answer the phone.    For prescription refill requests, have your pharmacy contact our office and allow 72 hours.    Due to Covid, you will need to wear a mask upon  entering the hospital. If you do not have a mask, a mask will be given to you at the Main Entrance upon arrival. For doctor visits, patients may have 1 support person age 18 or older with them. For treatment visits, patients can not have anyone with them due to social distancing guidelines and our immunocompromised population.      

## 2022-01-28 NOTE — Patient Instructions (Signed)
[facility]  Discharge Instructions: Thank you for choosing [facility] to provide your oncology and hematology care.  If you have a lab appointment with the [facility], please come in thru the Main Entrance and check in at the main information desk.  Wear comfortable clothing and clothing appropriate for easy access to any Portacath or PICC line.   We strive to give you quality time with your provider. You may need to reschedule your appointment if you arrive late (15 or more minutes).  Arriving late affects you and other patients whose appointments are after yours.  Also, if you miss three or more appointments without notifying the office, you may be dismissed from the clinic at the provider's discretion.      For prescription refill requests, have your pharmacy contact our office and allow 72 hours for refills to be completed.    Today you received the following chemotherapy and/or immunotherapy agents Jevtana/Eligard/Neulasta.  Pegfilgrastim Injection What is this medication? PEGFILGRASTIM (PEG fil gra stim) lowers the risk of infection in people who are receiving chemotherapy. It works by Building control surveyor make more white blood cells, which protects your body from infection. It may also be used to help people who have been exposed to high doses of radiation. This medicine may be used for other purposes; ask your health care provider or pharmacist if you have questions. COMMON BRAND NAME(S): [place], Neulasta, Nyvepria, Stimufend, UDENYCA, Ziextenzo What should I tell my care team before I take this medication? They need to know if you have any of these conditions: Kidney disease Latex allergy Ongoing radiation therapy Sickle cell disease Skin reactions to acrylic adhesives (On-Body Injector only) An unusual or allergic reaction to pegfilgrastim, filgrastim, other medications, foods, dyes, or preservatives Pregnant or trying to get  pregnant Breast-feeding How should I use this medication? This medication is for injection under the skin. If you get this medication at home, you will be taught how to prepare and give the pre-filled syringe or how to use the On-body Injector. Refer to the patient Instructions for Use for detailed instructions. Use exactly as directed. Tell your care team immediately if you suspect that the On-body Injector may not have performed as intended or if you suspect the use of the On-body Injector resulted in a missed or partial dose. It is important that you put your used needles and syringes in a special sharps container. Do not put them in a trash can. If you do not have a sharps container, call your pharmacist or care team to get one. Talk to your care team about the use of this medication in children. While this medication may be prescribed for selected conditions, precautions do apply. Overdosage: If you think you have taken too much of this medicine contact a poison control center or emergency room at once. NOTE: This medicine is only for you. Do not share this medicine with others. What if I miss a dose? It is important not to miss your dose. Call your care team if you miss your dose. If you miss a dose due to an On-body Injector failure or leakage, a new dose should be administered as soon as possible using a single prefilled syringe for manual use. What may interact with this medication? Interactions have not been studied. This list may not describe all possible interactions. Give your health care provider a list of all the medicines, herbs, non-prescription drugs, or dietary supplements you use. Also tell  them if you smoke, drink alcohol, or use illegal drugs. Some items may interact with your medicine. What should I watch for while using this medication? Your condition will be monitored carefully while you are receiving this medication. You may need blood work done while you are taking this  medication. Talk to your care team about your risk of cancer. You may be more at risk for certain types of cancer if you take this medication. If you are going to need a MRI, CT scan, or other procedure, tell your care team that you are using this medication (On-Body Injector only). What side effects may I notice from receiving this medication? Side effects that you should report to your care team as soon as possible: Allergic reactions--skin rash, itching, hives, swelling of the face, lips, tongue, or throat Capillary leak syndrome--stomach or muscle pain, unusual weakness or fatigue, feeling faint or lightheaded, decrease in the amount of urine, swelling of the ankles, hands, or feet, trouble breathing High white blood cell level--fever, fatigue, trouble breathing, night sweats, change in vision, weight loss Inflammation of the aorta--fever, fatigue, back, chest, or stomach pain, severe headache Kidney injury (glomerulonephritis)--decrease in the amount of urine, red or dark Berley Gambrell urine, foamy or bubbly urine, swelling of the ankles, hands, or feet Shortness of breath or trouble breathing Spleen injury--pain in upper left stomach or shoulder Unusual bruising or bleeding Side effects that usually do not require medical attention (report to your care team if they continue or are bothersome): Bone pain Pain in the hands or feet This list may not describe all possible side effects. Call your doctor for medical advice about side effects. You may report side effects to FDA at 1-800-FDA-1088. Where should I keep my medication? Keep out of the reach of children. If you are using this medication at home, you will be instructed on how to store it. Throw away any unused medication after the expiration date on the label. NOTE: This sheet is a summary. It may not cover all possible information. If you have questions about this medicine, talk to your doctor, pharmacist, or health care provider.  2023  Elsevier/Gold Standard (2013-08-11 00:00:00)   Leuprolide Suspension for Injection (Prostate Cancer) What is this medication? LEUPROLIDE (loo PROE lide) reduces the symptoms of prostate cancer. It works by decreasing levels of the hormone testosterone in the body. This prevents prostate cancer cells from spreading or growing. This medicine may be used for other purposes; ask your health care provider or pharmacist if you have questions. COMMON BRAND NAME(S): Eligard, Fensolvi, Lupron Depot, Lupron Depot-Ped, Lutrate Depot, Viadur What should I tell my care team before I take this medication? They need to know if you have any of these conditions: Diabetes Heart disease Heart failure High or low levels of electrolytes, such as magnesium, potassium, or sodium in your blood Irregular heartbeat or rhythm Seizures An unusual or allergic reaction to leuprolide, other medications, foods, dyes, or preservatives Pregnant or trying to get pregnant Breast-feeding How should I use this medication? This medication is injected under the skin or into a muscle. It is given by your care team in a hospital or clinic setting. Talk to your care team about the use of this medication in children. Special care may be needed. Overdosage: If you think you have taken too much of this medicine contact a poison control center or emergency room at once. NOTE: This medicine is only for you. Do not share this medicine with others. What if I  miss a dose? Keep appointments for follow-up doses. It is important not to miss your dose. Call your care team if you are unable to keep an appointment. What may interact with this medication? Do not take this medication with any of the following: Cisapride Dronedarone Ketoconazole Levoketoconazole Pimozide Thioridazine This medication may also interact with the following: Other medications that cause heart rhythm changes This list may not describe all possible interactions.  Give your health care provider a list of all the medicines, herbs, non-prescription drugs, or dietary supplements you use. Also tell them if you smoke, drink alcohol, or use illegal drugs. Some items may interact with your medicine. What should I watch for while using this medication? Visit your care team for regular checks on your progress. Tell your care team if your symptoms do not start to get better or if they get worse. This medication may increase blood sugar. The risk may be higher in patients who already have diabetes. Ask your care team what you can do to lower the risk of diabetes while taking this medication. This medication may cause infertility. Talk to your care team if you are concerned about your fertility. Heart attacks and strokes have been reported with the use of this medication. Get emergency help if you develop signs or symptoms of a heart attack or stroke. Talk to your care team about the risks and benefits of this medication. What side effects may I notice from receiving this medication? Side effects that you should report to your care team as soon as possible: Allergic reactions--skin rash, itching, hives, swelling of the face, lips, tongue, or throat Heart attack--pain or tightness in the chest, shoulders, arms, or jaw, nausea, shortness of breath, cold or clammy skin, feeling faint or lightheaded Heart rhythm changes--fast or irregular heartbeat, dizziness, feeling faint or lightheaded, chest pain, trouble breathing High blood sugar (hyperglycemia)--increased thirst or amount of urine, unusual weakness or fatigue, blurry vision Mood swings, irritability, hostility Seizures Stroke--sudden numbness or weakness of the face, arm, or leg, trouble speaking, confusion, trouble walking, loss of balance or coordination, dizziness, severe headache, change in vision Thoughts of suicide or self-harm, worsening mood, feelings of depression Side effects that usually do not require  medical attention (report to your care team if they continue or are bothersome): Bone pain Change in sex drive or performance General discomfort and fatigue Hot flashes Muscle pain Pain, redness, or irritation at injection site Swelling of the ankles, hands, or feet This list may not describe all possible side effects. Call your doctor for medical advice about side effects. You may report side effects to FDA at 1-800-FDA-1088. Where should I keep my medication? This medication is given in a hospital or clinic. It will not be stored at home. NOTE: This sheet is a summary. It may not cover all possible information. If you have questions about this medicine, talk to your doctor, pharmacist, or health care provider.  2023 Elsevier/Gold Standard (2021-07-21 00:00:00)   Cabazitaxel Injection What is this medication? CABAZITAXEL (ka BAZ i TAX el) treats prostate cancer. It works by slowing down the growth of cancer cells. This medicine may be used for other purposes; ask your health care provider or pharmacist if you have questions. COMMON BRAND NAME(S): Jevtana What should I tell my care team before I take this medication? They need to know if you have any of these conditions: Kidney problems Liver disease Low white blood cell levels Lung disease Stomach or intestine problems An unusual or allergic  reaction to cabazitaxel, polysorbate 80, other medications, foods, dyes, or preservatives Pregnant or trying to get pregnant Breast-feeding How should I use this medication? This medication is injected into a vein. It is given by your care team in a hospital or clinic setting. Talk to your care team about the use of this medication in children. Special care may be needed. Overdosage: If you think you have taken too much of this medicine contact a poison control center or emergency room at once. NOTE: This medicine is only for you. Do not share this medicine with others. What if I miss a  dose? Keep appointments for follow-up doses. It is important not to miss your dose. Call your care team if you are unable to keep an appointment. What may interact with this medication? Certain antibiotics, such as clarithromycin or telithromycin Certain antivirals for HIV or AIDS Certain medications for fungal infections like ketoconazole, itraconazole, and voriconazole Nefazodone This list may not describe all possible interactions. Give your health care provider a list of all the medicines, herbs, non-prescription drugs, or dietary supplements you use. Also tell them if you smoke, drink alcohol, or use illegal drugs. Some items may interact with your medicine. What should I watch for while using this medication? This medication may make you feel generally unwell. This is not uncommon as chemotherapy can affect healthy cells as well as cancer cells. Report any side effects. Continue your course of treatment even though you feel ill unless your care team tells you to stop. You may need blood work while you are taking this medication. This medication may increase your risk of getting an infection. Call your care team for advice if you get a fever, chills, sore throat, or other symptoms of a cold or flu. Do not treat yourself. Try to avoid being around people who are sick. Avoid taking medications that contain aspirin, acetaminophen, ibuprofen, naproxen, or ketoprofen unless instructed by your care team. These medications may hide a fever. Be careful brushing or flossing your teeth or using a toothpick because you may get an infection or bleed more easily. If you have any dental work done, tell your dentist you are receiving this medication. This medication can cause serious infusion reactions. To reduce the risk, your care team may give you other medications to take before receiving this one. Be sure to follow the directions from your care team. Use a condom during sex while taking this medication and  for 3 months after the last dose. Tell your care team right away if you think your partner might be pregnant. This medication can cause serious birth defects. This medication may cause infertility. Talk to your care team if you are concerned about your fertility. What side effects may I notice from receiving this medication? Side effects that you should report to your care team as soon as possible: Allergic reactions--skin rash, itching, hives, swelling of the face, lips, tongue, or throat Dry cough, shortness of breath or trouble breathing Infection--fever, chills, cough, or sore throat Kidney injury--decrease in the amount of urine, swelling of the ankles, hands, or feet Low red blood cell level--unusual weakness or fatigue, dizziness, headache, trouble breathing Pain, tingling, or numbness in the hands or feet Red or dark Andre [name] urine Sudden or severe stomach pain, bloody diarrhea, fever, nausea, vomiting Unusual bruising or bleeding Side effects that usually do not require medical attention (report to your care team if they continue or are bothersome): Diarrhea Fatigue Loss of appetite Nausea Vomiting This list  may not describe all possible side effects. Call your doctor for medical advice about side effects. You may report side effects to FDA at 1-800-FDA-1088. Where should I keep my medication? This medication is given in a hospital or clinic. It will not be stored at home. NOTE: This sheet is a summary. It may not cover all possible information. If you have questions about this medicine, talk to your doctor, pharmacist, or health care provider.  2023 Elsevier/Gold Standard (2021-10-01 00:00:00)        To help prevent nausea and vomiting after your treatment, we encourage you to take your nausea medication as directed.  BELOW ARE SYMPTOMS THAT SHOULD BE REPORTED IMMEDIATELY: *FEVER GREATER THAN 100.4 F (38 C) OR HIGHER *CHILLS OR SWEATING *NAUSEA AND VOMITING THAT IS NOT  CONTROLLED WITH YOUR NAUSEA MEDICATION *UNUSUAL SHORTNESS OF BREATH *UNUSUAL BRUISING OR BLEEDING *URINARY PROBLEMS (pain or burning when urinating, or frequent urination) *BOWEL PROBLEMS (unusual diarrhea, constipation, pain near the anus) TENDERNESS IN MOUTH AND THROAT WITH OR WITHOUT PRESENCE OF ULCERS (sore throat, sores in mouth, or a toothache) UNUSUAL RASH, SWELLING OR PAIN  UNUSUAL VAGINAL DISCHARGE OR ITCHING   Items with * indicate a potential emergency and should be followed up as soon as possible or go to the Emergency Department if any problems should occur.  Please show the CHEMOTHERAPY ALERT CARD or IMMUNOTHERAPY ALERT CARD at check-in to the Emergency Department and triage nurse.  Should you have questions after your visit or need to cancel or reschedule your appointment, please contact [facility] [phone]  and follow the prompts.  Office hours are 8:00 a.m. to 4:30 p.m. Monday - Friday. Please note that voicemails left after 4:00 p.m. may not be returned until the following business day.  We are closed weekends and major holidays. You have access to a nurse at all times for urgent questions. Please call the main number to the clinic [phone] and follow the prompts.  For any non-urgent questions, you may also contact your provider using MyChart. We now offer e-Visits for anyone 110 and older to request care online for non-urgent symptoms. For details visit [email or web address].   Also download the MyChart app! Go to the app store, search "MyChart", open the app, select [facility], and log in with your MyChart username and password.  Masks are optional in the cancer centers. If you would like for your care team to wear a mask while they are taking care of you, please let them know. You may have one support person who is at least 83 years old accompany you for your appointments.

## 2022-01-28 NOTE — Progress Notes (Signed)
Patient presents today for chemotherapy infusion.  Patient is in satisfactory condition with no complaints voiced.  Vital signs are stable.  Labs reviewed by Dr. Delton Coombes during his office visit.  All labs are within treatment parameters.  We will proceed with treatment per MD orders.   Patient tolerated Eligard injection with no complaints voiced.  Site clean and dry with no bruising or swelling noted.  No complaints of pain.  Discharged with vital signs stable and no signs or symptoms of distress noted.    Patient presents today for treatment per orders.  Patient tolerated treatment well with no complaints voiced.  Neulasta OnPro secured to R arm.  Patient left via wheelchair with daughter in stable condition.  Vital signs stable at discharge.  Follow up as scheduled.

## 2022-01-28 NOTE — Progress Notes (Signed)
Patient has been examined by Dr. Katragadda, and vital signs and labs have been reviewed. ANC, Creatinine, LFTs, hemoglobin, and platelets are within treatment parameters per M.D. - pt may proceed with treatment.  Primary RN and pharmacy notified.  

## 2022-01-29 ENCOUNTER — Other Ambulatory Visit: Payer: Self-pay

## 2022-01-31 ENCOUNTER — Other Ambulatory Visit: Payer: Self-pay

## 2022-02-01 ENCOUNTER — Other Ambulatory Visit: Payer: Self-pay

## 2022-02-03 ENCOUNTER — Other Ambulatory Visit: Payer: Self-pay

## 2022-02-16 ENCOUNTER — Other Ambulatory Visit (HOSPITAL_COMMUNITY): Payer: Medicare PPO

## 2022-02-16 ENCOUNTER — Ambulatory Visit: Payer: Medicare PPO | Admitting: Hematology

## 2022-02-16 ENCOUNTER — Other Ambulatory Visit: Payer: Medicare PPO

## 2022-02-16 ENCOUNTER — Ambulatory Visit: Payer: Medicare PPO

## 2022-02-18 ENCOUNTER — Inpatient Hospital Stay (HOSPITAL_BASED_OUTPATIENT_CLINIC_OR_DEPARTMENT_OTHER): Payer: Medicare PPO | Admitting: Hematology

## 2022-02-18 ENCOUNTER — Inpatient Hospital Stay: Payer: Medicare PPO

## 2022-02-18 ENCOUNTER — Ambulatory Visit (HOSPITAL_COMMUNITY)
Admission: RE | Admit: 2022-02-18 | Discharge: 2022-02-18 | Disposition: A | Discharge status: Home / Self Care | Payer: Medicare PPO | Source: Ambulatory Visit | Attending: Hematology | Admitting: Hematology

## 2022-02-18 VITALS — BP 148/78 | HR 60 | Temp 96.8°F | Resp 18

## 2022-02-18 VITALS — BP 181/91 | HR 71 | Temp 96.9°F | Resp 20

## 2022-02-18 DIAGNOSIS — Z95828 Presence of other vascular implants and grafts: Secondary | ICD-10-CM

## 2022-02-18 DIAGNOSIS — Z5111 Encounter for antineoplastic chemotherapy: Secondary | ICD-10-CM | POA: Diagnosis not present

## 2022-02-18 DIAGNOSIS — C771 Secondary and unspecified malignant neoplasm of intrathoracic lymph nodes: Secondary | ICD-10-CM

## 2022-02-18 DIAGNOSIS — C61 Malignant neoplasm of prostate: Secondary | ICD-10-CM

## 2022-02-18 LAB — COMPREHENSIVE METABOLIC PANEL
ALT: 15 U/L (ref 0–44)
AST: 16 U/L (ref 15–41)
Albumin: 3.9 g/dL (ref 3.5–5.0)
Alkaline Phosphatase: 62 U/L (ref 38–126)
Anion gap: 8 (ref 5–15)
BUN: 16 mg/dL (ref 8–23)
CO2: 26 mmol/L (ref 22–32)
Calcium: 8.8 mg/dL — ABNORMAL LOW (ref 8.9–10.3)
Chloride: 107 mmol/L (ref 98–111)
Creatinine, Ser: 0.88 mg/dL (ref 0.61–1.24)
GFR, Estimated: >60 mL/min (ref >60)
Glucose, Bld: 139 mg/dL — ABNORMAL HIGH (ref 70–99)
Potassium: 3.6 mmol/L (ref 3.5–5.1)
Sodium: 141 mmol/L (ref 135–145)
Total Bilirubin: 0.7 mg/dL (ref 0.3–1.2)
Total Protein: 6.4 g/dL — ABNORMAL LOW (ref 6.5–8.1)

## 2022-02-18 LAB — CBC WITH DIFFERENTIAL/PLATELET
Abs Immature Granulocytes: 0.06 10*3/uL (ref 0.00–0.07)
Basophils Absolute: 0.1 10*3/uL (ref 0.0–0.1)
Basophils Relative: 1 %
Eosinophils Absolute: 0.1 10*3/uL (ref 0.0–0.5)
Eosinophils Relative: 1 %
HCT: 34 % — ABNORMAL LOW (ref 39.0–52.0)
Hemoglobin: 10.8 g/dL — ABNORMAL LOW (ref 13.0–17.0)
Immature Granulocytes: 1 %
Lymphocytes Relative: 10 %
Lymphs Abs: 1.2 10*3/uL (ref 0.7–4.0)
MCH: 28.6 pg (ref 26.0–34.0)
MCHC: 31.8 g/dL (ref 30.0–36.0)
MCV: 89.9 fL (ref 80.0–100.0)
Monocytes Absolute: 1.4 10*3/uL — ABNORMAL HIGH (ref 0.1–1.0)
Monocytes Relative: 11 %
Neutro Abs: 9.4 10*3/uL — ABNORMAL HIGH (ref 1.7–7.7)
Neutrophils Relative %: 76 %
Platelets: 301 10*3/uL (ref 150–400)
RBC: 3.78 MIL/uL — ABNORMAL LOW (ref 4.22–5.81)
RDW: 17.5 % — ABNORMAL HIGH (ref 11.5–15.5)
WBC: 12.2 10*3/uL — ABNORMAL HIGH (ref 4.0–10.5)
nRBC: 0 % (ref 0.0–0.2)

## 2022-02-18 LAB — MAGNESIUM: Magnesium: 1.9 mg/dL (ref 1.7–2.4)

## 2022-02-18 LAB — PSA: Prostatic Specific Antigen: 16.85 ng/mL — ABNORMAL HIGH (ref 0.00–4.00)

## 2022-02-18 MED ORDER — PEGFILGRASTIM 6 MG/0.6ML ~~LOC~~ PSKT
6.0000 mg | PREFILLED_SYRINGE | Freq: Once | SUBCUTANEOUS | Status: AC
  Administered 2022-02-18: 6 mg via SUBCUTANEOUS
  Filled 2022-02-18: qty 0.6

## 2022-02-18 MED ORDER — DENOSUMAB 120 MG/1.7ML ~~LOC~~ SOLN
120.0000 mg | Freq: Once | SUBCUTANEOUS | Status: AC
  Administered 2022-02-18: 120 mg via SUBCUTANEOUS
  Filled 2022-02-18: qty 1.7

## 2022-02-18 MED ORDER — FAMOTIDINE IN NACL 20-0.9 MG/50ML-% IV SOLN
20.0000 mg | Freq: Once | INTRAVENOUS | Status: AC
  Administered 2022-02-18: 20 mg via INTRAVENOUS
  Filled 2022-02-18: qty 50

## 2022-02-18 MED ORDER — SODIUM CHLORIDE 0.9 % IV SOLN: 8.0000 mg | Freq: Once | INTRAVENOUS | Status: DC

## 2022-02-18 MED ORDER — DIPHENHYDRAMINE HCL 50 MG/ML IJ SOLN
25.0000 mg | Freq: Once | INTRAMUSCULAR | Status: AC
  Administered 2022-02-18: 25 mg via INTRAVENOUS
  Filled 2022-02-18: qty 1

## 2022-02-18 MED ORDER — SODIUM CHLORIDE 0.9% FLUSH
10.0000 mL | INTRAVENOUS | Status: DC | PRN
  Administered 2022-02-18: 10 mL

## 2022-02-18 MED ORDER — SODIUM CHLORIDE 0.9 % IV SOLN
10.0000 mg | Freq: Once | INTRAVENOUS | Status: AC
  Administered 2022-02-18: 10 mg via INTRAVENOUS
  Filled 2022-02-18: qty 10

## 2022-02-18 MED ORDER — SODIUM CHLORIDE 0.9 % IV SOLN: Freq: Once | INTRAVENOUS | Status: AC

## 2022-02-18 MED ORDER — ONDANSETRON HCL 4 MG/2ML IJ SOLN
8.0000 mg | Freq: Once | INTRAMUSCULAR | Status: AC
  Administered 2022-02-18: 8 mg via INTRAVENOUS
  Filled 2022-02-18: qty 4

## 2022-02-18 MED ORDER — HEPARIN SOD (PORK) LOCK FLUSH 100 UNIT/ML IV SOLN
500.0000 [IU] | Freq: Once | INTRAVENOUS | Status: AC | PRN
  Administered 2022-02-18: 500 [IU]

## 2022-02-18 MED ORDER — SODIUM CHLORIDE 0.9 % IV SOLN
20.0000 mg/m2 | Freq: Once | INTRAVENOUS | Status: AC
  Administered 2022-02-18: 45 mg via INTRAVENOUS
  Filled 2022-02-18: qty 4.5

## 2022-02-18 NOTE — Progress Notes (Signed)
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 Docetaxel x 14 cycles from 08/11/2015 through 06/12/2016. 2. Abiraterone from 07/13/2016 through 04/09/2019. 3. Enzalutamide from 04/12/2019 to 11/03/2019.  NGS Results: Foundation 1 MS--stable, TMB 4 Muts/Mb  CURRENT THERAPY: Cabazitaxel + Prednisone q21d  BRIEF ONCOLOGIC HISTORY:  Oncology History  Prostate cancer metastatic to intrathoracic lymph node (Andre [name])  07/24/2015 Initial Diagnosis   Prostate cancer metastatic to the left supraclavicular and anterior mediastinal lymph nodes   08/21/2015 - 06/12/2016 Chemotherapy   Docetaxel every 3 weeks for 14 cycles, discontinued as PSA has plateaued around 11    07/13/2016 Treatment Plan Change   Zytiga 1000 milligrams daily along with prednisone 5 mg daily, started secondary to fluid overload from docetaxel   10/14/2018 Genetic Testing   Negative genetic testing.  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  The Common Hereditary Gene Panel offered by [facility] includes sequencing and/or deletion duplication testing of the following 48 genes: APC, ATM, AXIN2, BARD1, BMPR1A, BRCA1, BRCA2, BRIP1, CDH1, CDK4, CDKN2A (p14ARF), CDKN2A (p16INK4a), CHEK2, CTNNA1, DICER1, EPCAM (Deletion/duplication testing only), GREM1 (promoter region deletion/duplication testing only), KIT, MEN1, MLH1, MSH2, MSH3, MSH6, MUTYH, NBN, NF1, NHTL1, PALB2, PDGFRA, PMS2, POLD1, POLE, PTEN, RAD50, RAD51C, RAD51D, RNF43, SDHB, SDHC, SDHD, SMAD4, SMARCA4. STK11, TP53, TSC1, TSC2, and VHL.  The following genes were evaluated for sequence changes only: SDHA and HOXB13 c.251G>A variant only. The report date is Oct 14, 2018.   09/18/2019 Genetic Testing   Foundation One  CDx      12/12/2019 Genetic Testing   Guardant 360       12/15/2021 - 01/06/2022 Chemotherapy   Patient is on Treatment Plan : PROSTATE Cabazitaxel + Prednisone q21d     12/15/2021 -  Chemotherapy   Patient is on Treatment Plan : PROSTATE Cabazitaxel (20) D1 + Prednisone D1-21 q21d       CANCER STAGING:  Cancer Staging  No matching staging information was found for the patient.  INTERVAL HISTORY:  Andre [name], a 81 y.o. male, seen for follow-up of metastatic prostate cancer and toxicity assessment prior to cycle 4 of cabazitaxel.  He denied any GI side effects.  Numbness in the feet has been stable.  Energy levels are 75%.  He is eating well.   REVIEW OF SYSTEMS:  Review of Systems  Neurological:  Positive for dizziness and numbness.  All other systems reviewed and are negative.   PAST MEDICAL/SURGICAL HISTORY:  Past Medical History:  Diagnosis Date   COPD (chronic obstructive pulmonary disease) (Andre [name])    Diabetes mellitus without complication (HCC)    Family history of ovarian cancer    Family history of prostate cancer    Hypertension    Prostate cancer (Andre [name])    Past Surgical History:  Procedure Laterality Date   GOLD SEED IMPLANT      SOCIAL HISTORY:  Social History   Socioeconomic History   Marital status: Widowed    Spouse name: Not on file   Number of children: Not on file   Years of education: Not on file   Highest education level: Not on file  Occupational History   Not on file  Tobacco Use   Smoking status: Former    Packs/day: 0.25  Years: 60.00    Total pack years: 15.00    Types: Cigarettes    Quit date: 06/25/2014    Years since quitting: 7.6   Smokeless tobacco: Never   Tobacco comments:    smoked since young age, quit in 2016  Substance and Sexual Activity   Alcohol use: Not Currently   Drug use: No   Sexual activity: Not Currently  Other Topics Concern   Not on file  Social History Narrative   Not on file   Social  Determinants of Health   Financial Resource Strain: Low Risk  (05/21/2020)   Overall Financial Resource Strain (CARDIA)    Difficulty of Paying Living Expenses: Not hard at all  Food Insecurity: No Food Insecurity (05/21/2020)   Hunger Vital Sign    Worried About Running Out of Food in the Last Year: Never true    Ran Out of Food in the Last Year: Never true  Transportation Needs: No Transportation Needs (05/21/2020)   PRAPARE - Hydrologist (Medical): No    Lack of Transportation (Non-Medical): No  Physical Activity: Insufficiently Active (05/21/2020)   Exercise Vital Sign    Days of Exercise per Week: 2 days    Minutes of Exercise per Session: 20 min  Stress: No Stress Concern Present (05/21/2020)   [place]    Feeling of Stress : Not at all  Social Connections: Moderately Isolated (05/21/2020)   Social Connection and Isolation Panel [NHANES]    Frequency of Communication with Friends and Family: More than three times a week    Frequency of Social Gatherings with Friends and Family: More than three times a week    Attends Religious Services: 1 to 4 times per year    Active Member of [employer] or Organizations: No    Attends Archivist Meetings: Never    Marital Status: Widowed  Intimate Partner Violence: Not At Risk (05/21/2020)   Humiliation, Afraid, Rape, and Kick questionnaire    Fear of Current or Ex-Partner: No    Emotionally Abused: No    Physically Abused: No    Sexually Abused: No    FAMILY HISTORY:  Family History  Problem Relation Age of Onset   Ovarian cancer Mother 68   Heart attack Father    Prostate cancer Maternal Uncle 48   Cancer Cousin        pat cousin with unknown cancer   Colon cancer Neg Hx    Pancreatic cancer Neg Hx    Breast cancer Neg Hx     CURRENT MEDICATIONS:  Current Outpatient Medications  Medication Sig Dispense Refill   ACCU-CHEK  GUIDE test strip      Accu-Chek Softclix Lancets lancets      acetaminophen (TYLENOL) 500 MG tablet Take 500 mg by mouth every 6 (six) hours as needed for moderate pain.     albuterol (ACCUNEB) 0.63 MG/3ML nebulizer solution Take 1 ampule by nebulization every 6 (six) hours as needed for wheezing.     albuterol (PROVENTIL) (2.5 MG/3ML) 0.083% nebulizer solution INHALE 3 ML IN NEBULIZER BY MOUTH FOUR TIMES A DAY AS NEEDED     amLODipine (NORVASC) 2.5 MG tablet Take 1 tablet (2.5 mg total) by mouth daily as needed. 30 tablet 0   aspirin 81 MG EC tablet Take 1 tablet by mouth daily.     atorvastatin (LIPITOR) 10 MG tablet TAKE 1 TABLET BY MOUTH EVERY DAY 90 tablet  3   azelastine (ASTELIN) 0.1 % nasal spray USE 2 SPRAYS IN EACH NOSTRIL TWICE A DAY FOR ALLERGIC RHINITIS     benzonatate (TESSALON) 200 MG capsule Take by mouth.     Blood Glucose Monitoring Suppl (ACCU-CHEK GUIDE) w/Device KIT      bumetanide (BUMEX) 1 MG tablet TAKE 1 AND 1/2 TABLETS(1.5 MG) BY MOUTH DAILY 45 tablet 6   CALCIUM PO Take 600 mg by mouth daily.      cetirizine (ZYRTEC) 10 MG tablet Take 1 tablet by mouth daily.     CHERATUSSIN AC 100-10 MG/5ML syrup Take 5 mLs by mouth daily as needed.   0   Cholecalciferol (VITAMIN D-3 PO) Take 1 tablet by mouth daily.     cyclobenzaprine (FLEXERIL) 10 MG tablet Take 10 mg by mouth as needed.   0   dicyclomine (BENTYL) 20 MG tablet Take 1 tablet by mouth 3 (three) times daily as needed.     dorzolamide (TRUSOPT) 2 % ophthalmic solution Place 1 drop into both eyes 3 (three) times daily.     ferrous sulfate 325 (65 FE) MG tablet Take 1 tablet by mouth daily.     fluticasone (FLONASE) 50 MCG/ACT nasal spray USE 2 SPRAY(S) IN EACH NOSTRIL ONCE DAILY FOR 30 DAYS 15.8 mL 12   guaifenesin (HUMIBID E) 400 MG TABS tablet Take 1 tablet by mouth 4 (four) times daily as needed.     ibuprofen (ADVIL,MOTRIN) 600 MG tablet Take 600 mg by mouth every 6 (six) hours as needed.   0    Ipratropium-Albuterol (COMBIVENT) 20-100 MCG/ACT AERS respimat INHALE 1 PUFF BY MOUTH FOUR TIMES A DAY AS NEEDED COPD     latanoprost (XALATAN) 0.005 % ophthalmic solution INSTILL 1 DROP INTO AFFECTED EYE(S) BY OPHTHALMIC ROUTE ONCE DAILY INTHE EVENING     Latanoprostene Bunod (VYZULTA) 0.024 % SOLN Apply to eye.     Leuprolide Acetate, 4 Month, (ELIGARD) 30 MG injection 30 MG SUBCUTANEOUSLY Q90D PRN     losartan (COZAAR) 100 MG tablet Take 1 tablet by mouth daily.     magnesium oxide (MAG-OX) 400 MG tablet Take 1 tablet (400 mg total) by mouth every other day. 90 tablet 2   MAGNESIUM-OXIDE 400 (240 Mg) MG tablet TAKE 1 TABLET BY MOUTH EVERY OTHER DAY 90 tablet 2   metFORMIN (GLUCOPHAGE) 500 MG tablet Take 2 tablets (1,000 mg total) by mouth 2 (two) times daily with a meal. 120 tablet 3   montelukast (SINGULAIR) 10 MG tablet Take 10 mg by mouth at bedtime.     potassium chloride (MICRO-K) 10 MEQ CR capsule TAKE 4 CAPSULES BY MOUTH WITH FOOD. OPEN AND SPRINKLE ON FOOD 120 capsule 3   predniSONE (DELTASONE) 5 MG tablet Take 1 tablet (5 mg total) by mouth daily with breakfast. 30 tablet 3   sucralfate (CARAFATE) 1 g tablet Take 1 tablet by mouth 4 (four) times daily as needed.     TRELEGY ELLIPTA 100-62.5-25 MCG/INH AEPB INHALE 1 PUFF ONCE DAILY     vitamin C (ASCORBIC ACID) 500 MG tablet Take 500 mg by mouth daily.     prochlorperazine (COMPAZINE) 10 MG tablet Take 1 tablet (10 mg total) by mouth every 6 (six) hours as needed for nausea or vomiting. (Patient not taking: Reported on 02/18/2022) 60 tablet 3   No current facility-administered medications for this visit.   Facility-Administered Medications Ordered in Other Visits  Medication Dose Route Frequency Provider Last Rate Last Admin   denosumab (XGEVA)  120 MG/1.7ML injection            diphenhydrAMINE (BENADRYL) 50 MG/ML injection            famotidine (PEPCID) 20-0.9 MG/50ML-% IVPB             ALLERGIES:  Allergies  Allergen Reactions    Lisinopril Cough   Metoprolol Other (See Comments) and Cough    Increases frequency of cough Other reaction(s): Other (See Comments) Increases frequency of cough    PHYSICAL EXAM:  Performance status (ECOG): 1 - Symptomatic but completely ambulatory  There were no vitals filed for this visit.  Wt Readings from Last 3 Encounters:  02/18/22 240 lb 15.4 oz (109.3 kg)  01/28/22 240 lb 12.8 oz (109.2 kg)  01/06/22 240 lb 3.2 oz (109 kg)   Physical Exam Vitals reviewed.  Constitutional:      Appearance: Normal appearance. He is obese.  Cardiovascular:     Rate and Rhythm: Normal rate and regular rhythm.     Pulses: Normal pulses.     Heart sounds: Normal heart sounds.  Pulmonary:     Effort: Pulmonary effort is normal.     Breath sounds: Normal breath sounds.  Neurological:     General: No focal deficit present.     Mental Status: He is alert and oriented to person, place, and time.  Psychiatric:        Mood and Affect: Mood normal.        Behavior: Behavior normal.     LABORATORY DATA:  I have reviewed the labs as listed.     Latest Ref Rng & Units 02/18/2022    8:39 AM 01/28/2022    9:26 AM 01/06/2022    9:35 AM  CBC  WBC 4.0 - 10.5 K/uL 12.2  10.6  9.2   Hemoglobin 13.0 - 17.0 g/dL 10.8  10.9  10.8   Hematocrit 39.0 - 52.0 % 34.0  34.0  34.2   Platelets 150 - 400 K/uL 301  349  357       Latest Ref Rng & Units 02/18/2022    8:39 AM 01/28/2022    9:26 AM 01/06/2022    9:35 AM  CMP  Glucose 70 - 99 mg/dL 139  164  130   BUN 8 - 23 mg/dL 16  15  19    Creatinine 0.61 - 1.24 mg/dL 0.88  0.94  0.88   Sodium 135 - 145 mmol/L 141  139  141   Potassium 3.5 - 5.1 mmol/L 3.6  3.5  3.7   Chloride 98 - 111 mmol/L 107  104  107   CO2 22 - 32 mmol/L 26  26  27    Calcium 8.9 - 10.3 mg/dL 8.8  8.5  8.9   Total Protein 6.5 - 8.1 g/dL 6.4  6.2  6.4   Total Bilirubin 0.3 - 1.2 mg/dL 0.7  0.8  0.5   Alkaline Phos 38 - 126 U/L 62  56  59   AST 15 - 41 U/L 16  15  14    ALT 0 - 44  U/L 15  13  12      DIAGNOSTIC IMAGING:  I have independently reviewed the scans and discussed with the patient. DG Chest Port 1 View  Result Date: 02/18/2022 CLINICAL DATA:  Port-A-Cath in place.  Verify tip position. EXAM: PORTABLE CHEST 1 VIEW COMPARISON:  Chest radiograph 10/01/2021 FINDINGS: Again noted is a right subclavian Port-A-Cath. Catheter tip overlies SVC region. Mild irregularity of  the catheter in the right chest wall area but no obvious catheter discontinuity. Negative for a pneumothorax. Heart size is upper limits of normal but probably accentuated from the AP technique. Few densities at the right lung base could represent atelectasis or mild scarring. No significant airspace disease or lung consolidation. Degenerative changes at the [facility] joints. IMPRESSION: 1. Right Port-A-Cath tip overlies the SVC region. Negative for a pneumothorax. 2. No acute chest abnormality. Question mild atelectasis at the right lung base. Electronically Signed   By: Markus Daft M.D.   On: 02/18/2022 09:06     ASSESSMENT:  1.  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 -Abiraterone and prednisone from 07/13/2016 through 04/09/2019, discontinued secondary to PSA progression. -Enzalutamide from 04/12/2019 through 11/03/2019 with progression. -Last Lupron on 06/09/2019. -CT CAP on 11/10/2019 shows no findings of active malignancy.  Stable small sclerotic lesions at T4 and T10, nonspecific.  Right hydronephrosis with right proximal hydroureter extending down to a 0.8 x 0.8 x 0.4 cm proximal ureteral stone at L3 vertical level.  Nonobstructive right and possibly left nephrolithiasis. -Bone scan on 11/10/2019 shows right proximal tibial metaphyseal activity from recent trauma.  Degenerative findings in the spine, right sternoclavicular joint, shoulders and right foot.  No evidence  of metastatic disease. -Foundation 1 test shows MS-stable.  AR amplification.  Sensitivity is reduced due to sample quality. -Guardant 360 on 12/12/2019 MSI high not detected.  TMB was not evaluable.  No other targetable mutations. -F-18-PYLARIFY PET scan showed intense radiotracer activity in the left supraclavicular and prevascular lymph nodes, measuring 10 mm.  Cluster of small prevascular lymph nodes measures 5-7 mm each with SUV 54.  AP window lymph node measuring 8 mm.  No activity in the prostate gland or abdominal or pelvic lymph nodes.  No skeletal activity. - SBRT to the left supraclavicular lymph node and prevascular mediastinal lymph node from 08/27/2020 through 09/06/2020, 40 Gray in 5 fractions. - CT CAP (10/22/2021): [facility] at [facility]: No lymphadenopathy.  Cirrhosis.  Intrahepatic and extrahepatic biliary ductal dilatation. - Bone scan (10/28/2021): New focal abnormal tracer uptake at the inferior aspect of the right scapula. - PSMA PET scan (11/13/2021): Interval development of multifocal tracer avid bone metastasis, only 1 of these lesions is visible on the recent nuclear medicine bone scan dated 10/28/2021.  New tracer avid right axillary, subcarinal, left paratracheal, left retrocrural and upper abdominal lymph nodes. - Cycle 1 of cabazitaxel on 12/15/2021, cycle 2 on 01/06/2022   2.  Androgen deprivation therapy induced bone loss: -Received Zometa every 3 months for a year completed on 10/29/2017.   3.  Genetic testing: -Germline mutation testing was negative.    PLAN:  1.  Metastatic prostate cancer to the left supraclavicular and mediastinal lymph nodes: - He has tolerated cycle 3 cabazitaxel at 20 mg/metered square very well. - No worsening of neuropathy. - Reviewed labs today which showed normal LFTs.  CBC was grossly normal. - PSA has improved to 16.8 from 26 after last treatment. - Proceed with cycle 4 without any dose modifications.  RTC 3 weeks for follow-up.   2.  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  May proceed with denosumab today and every 6 weeks.   3.  Leg swelling: - Continue Bumex 1 and half tablets daily.  Mild swelling is stable.   4.  Peripheral neuropathy: -  Feet feeling cold all the time with no neuropathic pains is stable.  5.  Normocytic anemia: - Hemoglobin is 10.8 from myelosuppression.  Continue monitoring.   Orders placed this encounter:  No orders of the defined types were placed in this encounter.     Derek Jack, MD [place] [phone]

## 2022-02-18 NOTE — Progress Notes (Signed)
Patient has been assessed, vital signs and labs have been reviewed by Dr. Katragadda. ANC, Creatinine, LFTs, and Platelets are within treatment parameters per Dr. Katragadda. The patient is good to proceed with treatment at this time. Primary RN and pharmacy aware.  

## 2022-02-18 NOTE — Patient Instructions (Signed)
[facility] [place]  Discharge Instructions: Thank you for choosing [facility] to provide your oncology and hematology care.  If you have a lab appointment with the [facility], please come in thru the Main Entrance and check in at the main information desk.  Wear comfortable clothing and clothing appropriate for easy access to any Portacath or PICC line.   We strive to give you quality time with your provider. You may need to reschedule your appointment if you arrive late (15 or more minutes).  Arriving late affects you and other patients whose appointments are after yours.  Also, if you miss three or more appointments without notifying the office, you may be dismissed from the clinic at the provider's discretion.      For prescription refill requests, have your pharmacy contact our office and allow 72 hours for refills to be completed.    Today you received the following chemotherapy and/or immunotherapy agents Jevtana Xgeva Neulasta      To help prevent nausea and vomiting after your treatment, we encourage you to take your nausea medication as directed.  BELOW ARE SYMPTOMS THAT SHOULD BE REPORTED IMMEDIATELY: *FEVER GREATER THAN 100.4 F (38 C) OR HIGHER *CHILLS OR SWEATING *NAUSEA AND VOMITING THAT IS NOT CONTROLLED WITH YOUR NAUSEA MEDICATION *UNUSUAL SHORTNESS OF BREATH *UNUSUAL BRUISING OR BLEEDING *URINARY PROBLEMS (pain or burning when urinating, or frequent urination) *BOWEL PROBLEMS (unusual diarrhea, constipation, pain near the anus) TENDERNESS IN MOUTH AND THROAT WITH OR WITHOUT PRESENCE OF ULCERS (sore throat, sores in mouth, or a toothache) UNUSUAL RASH, SWELLING OR PAIN  UNUSUAL VAGINAL DISCHARGE OR ITCHING   Items with * indicate a potential emergency and should be followed up as soon as possible or go to the Emergency Department if any problems should occur.  Please show the CHEMOTHERAPY ALERT CARD or IMMUNOTHERAPY ALERT CARD at check-in  to the Emergency Department and triage nurse.  Should you have questions after your visit or need to cancel or reschedule your appointment, please contact [facility] [phone]  and follow the prompts.  Office hours are 8:00 a.m. to 4:30 p.m. Monday - Friday. Please note that voicemails left after 4:00 p.m. may not be returned until the following business day.  We are closed weekends and major holidays. You have access to a nurse at all times for urgent questions. Please call the main number to the clinic [phone] and follow the prompts.  For any non-urgent questions, you may also contact your provider using MyChart. We now offer e-Visits for anyone 72 and older to request care online for non-urgent symptoms. For details visit [email or web address].   Also download the MyChart app! Go to the app store, search "MyChart", open the app, select [facility], and log in with your MyChart username and password.  Masks are optional in the cancer centers. If you would like for your care team to wear a mask while they are taking care of you, please let them know. You may have one support person who is at least 83 years old accompany you for your appointments.

## 2022-02-18 NOTE — Progress Notes (Signed)
Patient presents today for Jevtana/Xgeva/Neulasta On Pro per providers orders.  Vital signs and labs within parameters for treatment.  Message received from Adonis Huguenin RN/Katragadda MD, patient okay for treatment.  Treatment given today per MD orders.  Stable during infusion and injections without adverse affects.  Vital signs stable.  No complaints at this time.  Discharge from clinic ambulatory in stable condition.  Alert and oriented X 3.  Follow up with [facility] as scheduled.

## 2022-02-18 NOTE — Patient Instructions (Signed)
[place]  Discharge Instructions  You were seen and examined today by Dr. Delton Coombes.  Proceed with treatment today as scheduled. Your PSA is trending down.  You will also get Xgeva today (bone shot).  Please start taking a stool softener every other day. Colace can be purchased over the counter.  Follow-up as scheduled.  Thank you for choosing [facility] to provide your oncology and hematology care.   To afford each patient quality time with our provider, please arrive at least 15 minutes before your scheduled appointment time. You may need to reschedule your appointment if you arrive late (10 or more minutes). Arriving late affects you and other patients whose appointments are after yours.  Also, if you miss three or more appointments without notifying the office, you may be dismissed from the clinic at the provider's discretion.    Again, thank you for choosing [facility].  Our hope is that these requests will decrease the amount of time that you wait before being seen by our physicians.   If you have a lab appointment with the [facility] please come in thru the Main Entrance and check in at the main information desk.           _____________________________________________________________  Should you have questions after your visit to [facility], please contact our office at [phone] and follow the prompts.  Our office hours are 8:00 a.m. to 4:30 p.m. Monday - Thursday and 8:00 a.m. to 2:30 p.m. Friday.  Please note that voicemails left after 4:00 p.m. may not be returned until the following business day.  We are closed weekends and all major holidays.  You do have access to a nurse 24-7, just call the main number to the clinic [phone] and do not press any options, hold on the line and a nurse will answer the phone.    For prescription refill requests, have your pharmacy contact our  office and allow 72 hours.    Masks are optional in the cancer centers. If you would like for your care team to wear a mask while they are taking care of you, please let them know. You may have one support person who is at least 83 years old accompany you for your appointments.

## 2022-02-24 ENCOUNTER — Other Ambulatory Visit: Payer: Self-pay

## 2022-02-25 ENCOUNTER — Other Ambulatory Visit: Payer: Self-pay

## 2022-03-10 ENCOUNTER — Inpatient Hospital Stay: Payer: Medicare PPO

## 2022-03-10 ENCOUNTER — Inpatient Hospital Stay: Payer: Medicare PPO | Admitting: Hematology

## 2022-03-10 ENCOUNTER — Inpatient Hospital Stay: Payer: Medicare PPO | Attending: Hematology

## 2022-03-10 VITALS — BP 145/78 | HR 69 | Temp 97.2°F | Resp 18

## 2022-03-10 DIAGNOSIS — Z79899 Other long term (current) drug therapy: Secondary | ICD-10-CM | POA: Diagnosis not present

## 2022-03-10 DIAGNOSIS — C61 Malignant neoplasm of prostate: Secondary | ICD-10-CM

## 2022-03-10 DIAGNOSIS — Z5111 Encounter for antineoplastic chemotherapy: Secondary | ICD-10-CM | POA: Insufficient documentation

## 2022-03-10 DIAGNOSIS — Z5189 Encounter for other specified aftercare: Secondary | ICD-10-CM | POA: Diagnosis not present

## 2022-03-10 DIAGNOSIS — G629 Polyneuropathy, unspecified: Secondary | ICD-10-CM | POA: Insufficient documentation

## 2022-03-10 DIAGNOSIS — M7989 Other specified soft tissue disorders: Secondary | ICD-10-CM | POA: Insufficient documentation

## 2022-03-10 DIAGNOSIS — C7951 Secondary malignant neoplasm of bone: Secondary | ICD-10-CM | POA: Diagnosis present

## 2022-03-10 DIAGNOSIS — Z87891 Personal history of nicotine dependence: Secondary | ICD-10-CM | POA: Diagnosis not present

## 2022-03-10 DIAGNOSIS — C771 Secondary and unspecified malignant neoplasm of intrathoracic lymph nodes: Secondary | ICD-10-CM | POA: Diagnosis not present

## 2022-03-10 LAB — CBC WITH DIFFERENTIAL/PLATELET
Abs Immature Granulocytes: 0.04 10*3/uL (ref 0.00–0.07)
Basophils Absolute: 0.1 10*3/uL (ref 0.0–0.1)
Basophils Relative: 1 %
Eosinophils Absolute: 0.1 10*3/uL (ref 0.0–0.5)
Eosinophils Relative: 1 %
HCT: 34.2 % — ABNORMAL LOW (ref 39.0–52.0)
Hemoglobin: 11 g/dL — ABNORMAL LOW (ref 13.0–17.0)
Immature Granulocytes: 0 %
Lymphocytes Relative: 10 %
Lymphs Abs: 1.1 10*3/uL (ref 0.7–4.0)
MCH: 29.3 pg (ref 26.0–34.0)
MCHC: 32.2 g/dL (ref 30.0–36.0)
MCV: 91.2 fL (ref 80.0–100.0)
Monocytes Absolute: 1.2 10*3/uL — ABNORMAL HIGH (ref 0.1–1.0)
Monocytes Relative: 10 %
Neutro Abs: 9.1 10*3/uL — ABNORMAL HIGH (ref 1.7–7.7)
Neutrophils Relative %: 78 %
Platelets: 328 10*3/uL (ref 150–400)
RBC: 3.75 MIL/uL — ABNORMAL LOW (ref 4.22–5.81)
RDW: 17.4 % — ABNORMAL HIGH (ref 11.5–15.5)
WBC: 11.7 10*3/uL — ABNORMAL HIGH (ref 4.0–10.5)
nRBC: 0 % (ref 0.0–0.2)

## 2022-03-10 LAB — COMPREHENSIVE METABOLIC PANEL
ALT: 14 U/L (ref 0–44)
AST: 17 U/L (ref 15–41)
Albumin: 3.9 g/dL (ref 3.5–5.0)
Alkaline Phosphatase: 54 U/L (ref 38–126)
Anion gap: 9 (ref 5–15)
BUN: 20 mg/dL (ref 8–23)
CO2: 27 mmol/L (ref 22–32)
Calcium: 9.1 mg/dL (ref 8.9–10.3)
Chloride: 107 mmol/L (ref 98–111)
Creatinine, Ser: 0.95 mg/dL (ref 0.61–1.24)
GFR, Estimated: >60 mL/min (ref >60)
Glucose, Bld: 121 mg/dL — ABNORMAL HIGH (ref 70–99)
Potassium: 3.5 mmol/L (ref 3.5–5.1)
Sodium: 143 mmol/L (ref 135–145)
Total Bilirubin: 0.6 mg/dL (ref 0.3–1.2)
Total Protein: 6.5 g/dL (ref 6.5–8.1)

## 2022-03-10 LAB — PSA: Prostatic Specific Antigen: 10.78 ng/mL — ABNORMAL HIGH (ref 0.00–4.00)

## 2022-03-10 LAB — MAGNESIUM: Magnesium: 1.6 mg/dL — ABNORMAL LOW (ref 1.7–2.4)

## 2022-03-10 MED ORDER — DIPHENHYDRAMINE HCL 50 MG/ML IJ SOLN
25.0000 mg | Freq: Once | INTRAMUSCULAR | Status: AC
  Administered 2022-03-10: 25 mg via INTRAVENOUS
  Filled 2022-03-10: qty 1

## 2022-03-10 MED ORDER — SODIUM CHLORIDE 0.9 % IV SOLN
10.0000 mg | Freq: Once | INTRAVENOUS | Status: AC
  Administered 2022-03-10: 10 mg via INTRAVENOUS
  Filled 2022-03-10: qty 10

## 2022-03-10 MED ORDER — SODIUM CHLORIDE 0.9 % IV SOLN: 8.0000 mg | Freq: Once | INTRAVENOUS | Status: DC

## 2022-03-10 MED ORDER — SODIUM CHLORIDE 0.9% FLUSH
10.0000 mL | INTRAVENOUS | Status: DC | PRN
  Administered 2022-03-10: 10 mL

## 2022-03-10 MED ORDER — MAGNESIUM OXIDE 400 MG PO TABS: 400.0000 mg | ORAL_TABLET | Freq: Every day | ORAL | 2 refills | Status: AC

## 2022-03-10 MED ORDER — FAMOTIDINE IN NACL 20-0.9 MG/50ML-% IV SOLN
20.0000 mg | Freq: Once | INTRAVENOUS | Status: AC
  Administered 2022-03-10: 20 mg via INTRAVENOUS
  Filled 2022-03-10: qty 50

## 2022-03-10 MED ORDER — ONDANSETRON HCL 4 MG/2ML IJ SOLN
8.0000 mg | Freq: Once | INTRAMUSCULAR | Status: AC
  Administered 2022-03-10: 8 mg via INTRAVENOUS
  Filled 2022-03-10: qty 4

## 2022-03-10 MED ORDER — SODIUM CHLORIDE 0.9 % IV SOLN: Freq: Once | INTRAVENOUS | Status: AC

## 2022-03-10 MED ORDER — PEGFILGRASTIM 6 MG/0.6ML ~~LOC~~ PSKT
6.0000 mg | PREFILLED_SYRINGE | Freq: Once | SUBCUTANEOUS | Status: AC
  Administered 2022-03-10: 6 mg via SUBCUTANEOUS
  Filled 2022-03-10: qty 0.6

## 2022-03-10 MED ORDER — HEPARIN SOD (PORK) LOCK FLUSH 100 UNIT/ML IV SOLN
500.0000 [IU] | Freq: Once | INTRAVENOUS | Status: AC | PRN
  Administered 2022-03-10: 500 [IU]

## 2022-03-10 MED ORDER — SODIUM CHLORIDE 0.9 % IV SOLN
20.0000 mg/m2 | Freq: Once | INTRAVENOUS | Status: AC
  Administered 2022-03-10: 45 mg via INTRAVENOUS
  Filled 2022-03-10: qty 4.5

## 2022-03-10 NOTE — Progress Notes (Signed)
Patient presents today for Jevtana infusion per providers order.  Vital signs within parameters for treatment.  Labs pending.    Labs reviewed by [place].  Message received from Anastasio Champion RN/Dr.Katragadda patient okay for treatment.  Treatment given today per MD orders.  Stable during infusion without adverse affects.  Vital signs stable.  Onpro placed on right arm.  Green indicator light is flashing.  No complaints at this time.  Discharge from clinic ambulatory in stable condition.  Alert and oriented X 3.  Follow up with [facility] as scheduled.

## 2022-03-10 NOTE — Progress Notes (Signed)
Patient has been examined by Dr. Katragadda, and vital signs and labs have been reviewed. ANC, Creatinine, LFTs, hemoglobin, and platelets are within treatment parameters per M.D. - pt may proceed with treatment.  Primary RN and pharmacy notified.  

## 2022-03-10 NOTE — Patient Instructions (Addendum)
[place] at [facility] Discharge Instructions   You were seen and examined today by Dr. Delton Coombes.  He reviewed the results of your lab work which are normal/stable. Your magnesium is slightly low at 1.6. We will give you magnesium in the clinic today.   We will proceed with your treatment today.   Return as scheduled in 3 weeks.    Thank you for choosing [place] at [facility] to provide your oncology and hematology care.  To afford each patient quality time with our provider, please arrive at least 15 minutes before your scheduled appointment time.   If you have a lab appointment with the [facility] please come in thru the Main Entrance and check in at the main information desk.  You need to re-schedule your appointment should you arrive 10 or more minutes late.  We strive to give you quality time with our providers, and arriving late affects you and other patients whose appointments are after yours.  Also, if you no show three or more times for appointments you may be dismissed from the clinic at the providers discretion.     Again, thank you for choosing [facility].  Our hope is that these requests will decrease the amount of time that you wait before being seen by our physicians.       _____________________________________________________________  Should you have questions after your visit to [facility], please contact our office at [phone] and follow the prompts.  Our office hours are 8:00 a.m. and 4:30 p.m. Monday - Friday.  Please note that voicemails left after 4:00 p.m. may not be returned until the following business day.  We are closed weekends and major holidays.  You do have access to a nurse 24-7, just call the main number to the clinic [phone] and do not press any options, hold on the line and a nurse will answer the phone.    For prescription refill requests, have your pharmacy  contact our office and allow 72 hours.    Due to Covid, you will need to wear a mask upon entering the hospital. If you do not have a mask, a mask will be given to you at the Main Entrance upon arrival. For doctor visits, patients may have 1 support person age 74 or older with them. For treatment visits, patients can not have anyone with them due to social distancing guidelines and our immunocompromised population.

## 2022-03-10 NOTE — Patient Instructions (Signed)
[place]  Discharge Instructions: Thank you for choosing [place] to provide your oncology and hematology care.  If you have a lab appointment with the [facility], please come in thru the Main Entrance and check in at the main information desk.  Wear comfortable clothing and clothing appropriate for easy access to any Portacath or PICC line.   We strive to give you quality time with your provider. You may need to reschedule your appointment if you arrive late (15 or more minutes).  Arriving late affects you and other patients whose appointments are after yours.  Also, if you miss three or more appointments without notifying the office, you may be dismissed from the clinic at the provider's discretion.      For prescription refill requests, have your pharmacy contact our office and allow 72 hours for refills to be completed.    Today you received the following chemotherapy and/or immunotherapy agents Jevtana Onpro      To help prevent nausea and vomiting after your treatment, we encourage you to take your nausea medication as directed.  BELOW ARE SYMPTOMS THAT SHOULD BE REPORTED IMMEDIATELY: *FEVER GREATER THAN 100.4 F (38 C) OR HIGHER *CHILLS OR SWEATING *NAUSEA AND VOMITING THAT IS NOT CONTROLLED WITH YOUR NAUSEA MEDICATION *UNUSUAL SHORTNESS OF BREATH *UNUSUAL BRUISING OR BLEEDING *URINARY PROBLEMS (pain or burning when urinating, or frequent urination) *BOWEL PROBLEMS (unusual diarrhea, constipation, pain near the anus) TENDERNESS IN MOUTH AND THROAT WITH OR WITHOUT PRESENCE OF ULCERS (sore throat, sores in mouth, or a toothache) UNUSUAL RASH, SWELLING OR PAIN  UNUSUAL VAGINAL DISCHARGE OR ITCHING   Items with * indicate a potential emergency and should be followed up as soon as possible or go to the Emergency Department if any problems should occur.  Please show the CHEMOTHERAPY ALERT CARD or IMMUNOTHERAPY ALERT CARD at check-in to the  Emergency Department and triage nurse.  Should you have questions after your visit or need to cancel or reschedule your appointment, please contact [place] [phone]  and follow the prompts.  Office hours are 8:00 a.m. to 4:30 p.m. Monday - Friday. Please note that voicemails left after 4:00 p.m. may not be returned until the following business day.  We are closed weekends and major holidays. You have access to a nurse at all times for urgent questions. Please call the main number to the clinic [phone] and follow the prompts.  For any non-urgent questions, you may also contact your provider using MyChart. We now offer e-Visits for anyone 46 and older to request care online for non-urgent symptoms. For details visit [email or web address].   Also download the MyChart app! Go to the app store, search "MyChart", open the app, select [place], and log in with your MyChart username and password.  Masks are optional in the cancer centers. If you would like for your care team to wear a mask while they are taking care of you, please let them know. You may have one support person who is at least 83 years old accompany you for your appointments.

## 2022-03-10 NOTE — Progress Notes (Signed)
[place], [place] [address]   CLINIC:  Medical Oncology/Hematology  PCP:  Clinic, [facility] [address] 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 Docetaxel x 14 cycles from 08/11/2015 through 06/12/2016. 2. Abiraterone from 07/13/2016 through 04/09/2019. 3. Enzalutamide from 04/12/2019 to 11/03/2019.  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  The Common Hereditary Gene Panel offered by [facility] includes sequencing and/or deletion duplication testing of the following 48 genes: APC, ATM, AXIN2, BARD1, BMPR1A, BRCA1, BRCA2, BRIP1, CDH1, CDK4, CDKN2A (p14ARF), CDKN2A (p16INK4a), CHEK2, CTNNA1, DICER1, EPCAM (Deletion/duplication testing only), GREM1 (promoter region deletion/duplication testing only), KIT, MEN1, MLH1, MSH2, MSH3, MSH6, MUTYH, NBN, NF1, NHTL1, PALB2, PDGFRA, PMS2, POLD1, POLE, PTEN, RAD50, RAD51C, RAD51D, RNF43, SDHB, SDHC, SDHD, SMAD4, SMARCA4. STK11, TP53, TSC1, TSC2, and VHL.  The following genes were evaluated for sequence changes only: SDHA and HOXB13 c.251G>A variant only. The report date is Oct 14, 2018.   09/18/2019 Genetic Testing   Foundation One  CDx      12/12/2019 Genetic Testing   Guardant 360       12/15/2021 - 01/06/2022 Chemotherapy   Patient is on Treatment Plan : PROSTATE Cabazitaxel + Prednisone q21d     12/15/2021 -  Chemotherapy   Patient is on Treatment Plan : PROSTATE Cabazitaxel (20) D1 + Prednisone D1-21 q21d       CANCER STAGING:  Cancer Staging  No matching staging information was found for the patient.  INTERVAL HISTORY:  [name], a 81 y.o. male, seen for follow-up and toxicity assessment prior to cycle 5 of cabazitaxel.  He has tolerated last cycle without any GI side effects.  His numbness and cold feeling in the bottom of the feet has been stable.  He is taking magnesium every other day.  Denies any falls or balance issues.   REVIEW OF SYSTEMS:  Review of Systems  Neurological:  Positive for numbness.  All other systems reviewed and are negative.   PAST MEDICAL/SURGICAL HISTORY:  Past Medical History:  Diagnosis Date   COPD (chronic obstructive pulmonary disease) ([place])    Diabetes mellitus without complication (HCC)    Family history of ovarian cancer    Family history of prostate cancer    Hypertension    Prostate cancer ([place])    Past Surgical History:  Procedure Laterality Date   GOLD SEED IMPLANT      SOCIAL HISTORY:  Social History   Socioeconomic History   Marital status: Widowed    Spouse name: Not on file   Number of children: Not on file   Years of education: Not on file   Highest education level: Not on file  Occupational History   Not on file  Tobacco Use  Smoking status: Former    Packs/day: 0.25    Years: 60.00    Total pack years: 15.00    Types: Cigarettes    Quit date: 06/25/2014    Years since quitting: 7.7   Smokeless tobacco: Never   Tobacco comments:    smoked since young age, quit in 2016  Substance and Sexual Activity   Alcohol use: Not Currently   Drug use: No   Sexual activity: Not Currently  Other Topics Concern   Not on file  Social  History Narrative   Not on file   Social Determinants of Health   Financial Resource Strain: Low Risk  (05/21/2020)   Overall Financial Resource Strain (CARDIA)    Difficulty of Paying Living Expenses: Not hard at all  Food Insecurity: No Food Insecurity (05/21/2020)   Hunger Vital Sign    Worried About Running Out of Food in the Last Year: Never true    Ran Out of Food in the Last Year: Never true  Transportation Needs: No Transportation Needs (05/21/2020)   PRAPARE - Hydrologist (Medical): No    Lack of Transportation (Non-Medical): No  Physical Activity: Insufficiently Active (05/21/2020)   Exercise Vital Sign    Days of Exercise per Week: 2 days    Minutes of Exercise per Session: 20 min  Stress: No Stress Concern Present (05/21/2020)   [place]    Feeling of Stress : Not at all  Social Connections: Moderately Isolated (05/21/2020)   Social Connection and Isolation Panel [NHANES]    Frequency of Communication with Friends and Family: More than three times a week    Frequency of Social Gatherings with Friends and Family: More than three times a week    Attends Religious Services: 1 to 4 times per year    Active Member of [employer] or Organizations: No    Attends Archivist Meetings: Never    Marital Status: Widowed  Intimate Partner Violence: Not At Risk (05/21/2020)   Humiliation, Afraid, Rape, and Kick questionnaire    Fear of Current or Ex-Partner: No    Emotionally Abused: No    Physically Abused: No    Sexually Abused: No    FAMILY HISTORY:  Family History  Problem Relation Age of Onset   Ovarian cancer Mother 60   Heart attack Father    Prostate cancer Maternal Uncle 55   Cancer Cousin        pat cousin with unknown cancer   Colon cancer Neg Hx    Pancreatic cancer Neg Hx    Breast cancer Neg Hx     CURRENT MEDICATIONS:  Current Outpatient Medications   Medication Sig Dispense Refill   ACCU-CHEK GUIDE test strip      Accu-Chek Softclix Lancets lancets      acetaminophen (TYLENOL) 500 MG tablet Take 500 mg by mouth every 6 (six) hours as needed for moderate pain.     albuterol (ACCUNEB) 0.63 MG/3ML nebulizer solution Take 1 ampule by nebulization every 6 (six) hours as needed for wheezing.     albuterol (PROVENTIL) (2.5 MG/3ML) 0.083% nebulizer solution INHALE 3 ML IN NEBULIZER BY MOUTH FOUR TIMES A DAY AS NEEDED     amLODipine (NORVASC) 2.5 MG tablet Take 1 tablet (2.5 mg total) by mouth daily as needed. 30 tablet 0   aspirin 81 MG EC tablet Take 1 tablet by mouth daily.     atorvastatin (LIPITOR) 10  MG tablet TAKE 1 TABLET BY MOUTH EVERY DAY 90 tablet 3   azelastine (ASTELIN) 0.1 % nasal spray USE 2 SPRAYS IN EACH NOSTRIL TWICE A DAY FOR ALLERGIC RHINITIS     benzonatate (TESSALON) 200 MG capsule Take by mouth.     Blood Glucose Monitoring Suppl (ACCU-CHEK GUIDE) w/Device KIT      bumetanide (BUMEX) 1 MG tablet TAKE 1 AND 1/2 TABLETS(1.5 MG) BY MOUTH DAILY 45 tablet 6   CALCIUM PO Take 600 mg by mouth daily.      cetirizine (ZYRTEC) 10 MG tablet Take 1 tablet by mouth daily.     CHERATUSSIN AC 100-10 MG/5ML syrup Take 5 mLs by mouth daily as needed.   0   Cholecalciferol (VITAMIN D-3 PO) Take 1 tablet by mouth daily.     cyclobenzaprine (FLEXERIL) 10 MG tablet Take 10 mg by mouth as needed.   0   dicyclomine (BENTYL) 20 MG tablet Take 1 tablet by mouth 3 (three) times daily as needed.     dorzolamide (TRUSOPT) 2 % ophthalmic solution Place 1 drop into both eyes 3 (three) times daily.     ferrous sulfate 325 (65 FE) MG tablet Take 1 tablet by mouth daily.     fluticasone (FLONASE) 50 MCG/ACT nasal spray USE 2 SPRAY(S) IN EACH NOSTRIL ONCE DAILY FOR 30 DAYS 15.8 mL 12   guaifenesin (HUMIBID E) 400 MG TABS tablet Take 1 tablet by mouth 4 (four) times daily as needed.     ibuprofen (ADVIL,MOTRIN) 600 MG tablet Take 600 mg by mouth every 6  (six) hours as needed.   0   Ipratropium-Albuterol (COMBIVENT) 20-100 MCG/ACT AERS respimat INHALE 1 PUFF BY MOUTH FOUR TIMES A DAY AS NEEDED COPD     latanoprost (XALATAN) 0.005 % ophthalmic solution INSTILL 1 DROP INTO AFFECTED EYE(S) BY OPHTHALMIC ROUTE ONCE DAILY INTHE EVENING     Latanoprostene Bunod (VYZULTA) 0.024 % SOLN Apply to eye.     Leuprolide Acetate, 4 Month, (ELIGARD) 30 MG injection 30 MG SUBCUTANEOUSLY Q90D PRN     losartan (COZAAR) 100 MG tablet Take 1 tablet by mouth daily.     magnesium oxide (MAG-OX) 400 MG tablet Take 1 tablet (400 mg total) by mouth every other day. 90 tablet 2   MAGNESIUM-OXIDE 400 (240 Mg) MG tablet TAKE 1 TABLET BY MOUTH EVERY OTHER DAY 90 tablet 2   metFORMIN (GLUCOPHAGE) 500 MG tablet Take 2 tablets (1,000 mg total) by mouth 2 (two) times daily with a meal. 120 tablet 3   montelukast (SINGULAIR) 10 MG tablet Take 10 mg by mouth at bedtime.     potassium chloride (MICRO-K) 10 MEQ CR capsule TAKE 4 CAPSULES BY MOUTH WITH FOOD. OPEN AND SPRINKLE ON FOOD 120 capsule 3   predniSONE (DELTASONE) 5 MG tablet Take 1 tablet (5 mg total) by mouth daily with breakfast. 30 tablet 3   prochlorperazine (COMPAZINE) 10 MG tablet Take 1 tablet (10 mg total) by mouth every 6 (six) hours as needed for nausea or vomiting. (Patient not taking: Reported on 02/18/2022) 60 tablet 3   sucralfate (CARAFATE) 1 g tablet Take 1 tablet by mouth 4 (four) times daily as needed.     TRELEGY ELLIPTA 100-62.5-25 MCG/INH AEPB INHALE 1 PUFF ONCE DAILY     vitamin C (ASCORBIC ACID) 500 MG tablet Take 500 mg by mouth daily.     No current facility-administered medications for this visit.   Facility-Administered Medications Ordered in Other Visits  Medication Dose  Route Frequency Provider Last Rate Last Admin   denosumab (XGEVA) 120 MG/1.7ML injection            diphenhydrAMINE (BENADRYL) 50 MG/ML injection            famotidine (PEPCID) 20-0.9 MG/50ML-% IVPB             ALLERGIES:   Allergies  Allergen Reactions   Lisinopril Cough   Metoprolol Other (See Comments) and Cough    Increases frequency of cough Other reaction(s): Other (See Comments) Increases frequency of cough    PHYSICAL EXAM:  Performance status (ECOG): 1 - Symptomatic but completely ambulatory  There were no vitals filed for this visit.  Wt Readings from Last 3 Encounters:  02/18/22 240 lb 15.4 oz (109.3 kg)  01/28/22 240 lb 12.8 oz (109.2 kg)  01/06/22 240 lb 3.2 oz (109 kg)   Physical Exam Vitals reviewed.  Constitutional:      Appearance: Normal appearance. He is obese.  Cardiovascular:     Rate and Rhythm: Normal rate and regular rhythm.     Pulses: Normal pulses.     Heart sounds: Normal heart sounds.  Pulmonary:     Effort: Pulmonary effort is normal.     Breath sounds: Normal breath sounds.  Neurological:     General: No focal deficit present.     Mental Status: He is alert and oriented to person, place, and time.  Psychiatric:        Mood and Affect: Mood normal.        Behavior: Behavior normal.     LABORATORY DATA:  I have reviewed the labs as listed.     Latest Ref Rng & Units 02/18/2022    8:39 AM 01/28/2022    9:26 AM 01/06/2022    9:35 AM  CBC  WBC 4.0 - 10.5 K/uL 12.2  10.6  9.2   Hemoglobin 13.0 - 17.0 g/dL 10.8  10.9  10.8   Hematocrit 39.0 - 52.0 % 34.0  34.0  34.2   Platelets 150 - 400 K/uL 301  349  357       Latest Ref Rng & Units 02/18/2022    8:39 AM 01/28/2022    9:26 AM 01/06/2022    9:35 AM  CMP  Glucose 70 - 99 mg/dL 139  164  130   BUN 8 - 23 mg/dL 16  15  19    Creatinine 0.61 - 1.24 mg/dL 0.88  0.94  0.88   Sodium 135 - 145 mmol/L 141  139  141   Potassium 3.5 - 5.1 mmol/L 3.6  3.5  3.7   Chloride 98 - 111 mmol/L 107  104  107   CO2 22 - 32 mmol/L 26  26  27    Calcium 8.9 - 10.3 mg/dL 8.8  8.5  8.9   Total Protein 6.5 - 8.1 g/dL 6.4  6.2  6.4   Total Bilirubin 0.3 - 1.2 mg/dL 0.7  0.8  0.5   Alkaline Phos 38 - 126 U/L 62  56  59   AST 15  - 41 U/L 16  15  14    ALT 0 - 44 U/L 15  13  12      DIAGNOSTIC IMAGING:  I have independently reviewed the scans and discussed with the patient. DG Chest Port 1 View  Result Date: 02/18/2022 CLINICAL DATA:  Port-A-Cath in place.  Verify tip position. EXAM: PORTABLE CHEST 1 VIEW COMPARISON:  Chest radiograph 10/01/2021 FINDINGS: Again noted is a  right subclavian Port-A-Cath. Catheter tip overlies SVC region. Mild irregularity of the catheter in the right chest wall area but no obvious catheter discontinuity. Negative for a pneumothorax. Heart size is upper limits of normal but probably accentuated from the AP technique. Few densities at the right lung base could represent atelectasis or mild scarring. No significant airspace disease or lung consolidation. Degenerative changes at the [facility] joints. IMPRESSION: 1. Right Port-A-Cath tip overlies the SVC region. Negative for a pneumothorax. 2. No acute chest abnormality. Question mild atelectasis at the right lung base. Electronically Signed   By: Markus Daft M.D.   On: 02/18/2022 09:06     ASSESSMENT:  1.  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 -Abiraterone and prednisone from 07/13/2016 through 04/09/2019, discontinued secondary to PSA progression. -Enzalutamide from 04/12/2019 through 11/03/2019 with progression. -Last Lupron on 06/09/2019. -CT CAP on 11/10/2019 shows no findings of active malignancy.  Stable small sclerotic lesions at T4 and T10, nonspecific.  Right hydronephrosis with right proximal hydroureter extending down to a 0.8 x 0.8 x 0.4 cm proximal ureteral stone at L3 vertical level.  Nonobstructive right and possibly left nephrolithiasis. -Bone scan on 11/10/2019 shows right proximal tibial metaphyseal activity from recent trauma.  Degenerative findings in the spine, right sternoclavicular joint,  shoulders and right foot.  No evidence of metastatic disease. -Foundation 1 test shows MS-stable.  AR amplification.  Sensitivity is reduced due to sample quality. -Guardant 360 on 12/12/2019 MSI high not detected.  TMB was not evaluable.  No other targetable mutations. -F-18-PYLARIFY PET scan showed intense radiotracer activity in the left supraclavicular and prevascular lymph nodes, measuring 10 mm.  Cluster of small prevascular lymph nodes measures 5-7 mm each with SUV 54.  AP window lymph node measuring 8 mm.  No activity in the prostate gland or abdominal or pelvic lymph nodes.  No skeletal activity. - SBRT to the left supraclavicular lymph node and prevascular mediastinal lymph node from 08/27/2020 through 09/06/2020, 40 Gray in 5 fractions. - CT CAP (10/22/2021): [facility] at [facility]: No lymphadenopathy.  Cirrhosis.  Intrahepatic and extrahepatic biliary ductal dilatation. - Bone scan (10/28/2021): New focal abnormal tracer uptake at the inferior aspect of the right scapula. - PSMA PET scan (11/13/2021): Interval development of multifocal tracer avid bone metastasis, only 1 of these lesions is visible on the recent nuclear medicine bone scan dated 10/28/2021.  New tracer avid right axillary, subcarinal, left paratracheal, left retrocrural and upper abdominal lymph nodes. - Cycle 1 of cabazitaxel on 12/15/2021, cycle 2 on 01/06/2022   2.  Androgen deprivation therapy induced bone loss: -Received Zometa every 3 months for a year completed on 10/29/2017.   3.  Genetic testing: -Germline mutation testing was negative.    PLAN:  1.  Metastatic prostate cancer to the left supraclavicular and mediastinal lymph nodes: -He has tolerated cycle 4 of cabazitaxel at 20 mg/m2 very well.  I have reviewed his labs today which showed normal LFTs and creatinine.  CBC was grossly normal.  Last PSA was 16.85 from 26.  PSA from today is pending. - We will proceed with cycle 5 today without any changes in the dose. - RTC  3 weeks for follow-up for cycle 6.  Plan to repeat CT CAP and bone scan after cycle 6.   2.  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  May continue  with denosumab every 6 weeks.   3.  Leg swelling: -Continue Bumex 1 and half tablets daily.  Mild swelling is stable.   4.  Peripheral neuropathy: -Feet feel cold in the bottom, which is stable since the start of cabazitaxel.  5.  Hypomagnesemia: -Magnesium today is 1.6.  He is taking magnesium every other day.  Will increase it to every day.   Orders placed this encounter:  No orders of the defined types were placed in this encounter.     Derek Jack, MD [place] [phone]

## 2022-03-12 ENCOUNTER — Other Ambulatory Visit: Payer: Self-pay | Admitting: *Deleted

## 2022-03-18 ENCOUNTER — Other Ambulatory Visit: Payer: Self-pay

## 2022-03-22 ENCOUNTER — Other Ambulatory Visit: Payer: Self-pay

## 2022-03-31 ENCOUNTER — Inpatient Hospital Stay: Payer: Medicare PPO | Attending: Hematology

## 2022-03-31 ENCOUNTER — Inpatient Hospital Stay: Payer: Medicare PPO

## 2022-03-31 ENCOUNTER — Inpatient Hospital Stay: Payer: Medicare PPO | Admitting: Hematology

## 2022-03-31 ENCOUNTER — Other Ambulatory Visit: Payer: Self-pay

## 2022-03-31 VITALS — BP 139/84 | HR 80 | Temp 98.7°F | Resp 19

## 2022-03-31 DIAGNOSIS — Z87891 Personal history of nicotine dependence: Secondary | ICD-10-CM | POA: Insufficient documentation

## 2022-03-31 DIAGNOSIS — C61 Malignant neoplasm of prostate: Secondary | ICD-10-CM | POA: Diagnosis present

## 2022-03-31 DIAGNOSIS — Z79899 Other long term (current) drug therapy: Secondary | ICD-10-CM | POA: Diagnosis not present

## 2022-03-31 DIAGNOSIS — C7951 Secondary malignant neoplasm of bone: Secondary | ICD-10-CM | POA: Diagnosis present

## 2022-03-31 DIAGNOSIS — C771 Secondary and unspecified malignant neoplasm of intrathoracic lymph nodes: Secondary | ICD-10-CM | POA: Diagnosis not present

## 2022-03-31 DIAGNOSIS — Z5111 Encounter for antineoplastic chemotherapy: Secondary | ICD-10-CM | POA: Diagnosis not present

## 2022-03-31 DIAGNOSIS — G629 Polyneuropathy, unspecified: Secondary | ICD-10-CM | POA: Insufficient documentation

## 2022-03-31 LAB — PSA: Prostatic Specific Antigen: 8.84 ng/mL — ABNORMAL HIGH (ref 0.00–4.00)

## 2022-03-31 LAB — COMPREHENSIVE METABOLIC PANEL
ALT: 13 U/L (ref 0–44)
AST: 14 U/L — ABNORMAL LOW (ref 15–41)
Albumin: 3.9 g/dL (ref 3.5–5.0)
Alkaline Phosphatase: 64 U/L (ref 38–126)
Anion gap: 11 (ref 5–15)
BUN: 16 mg/dL (ref 8–23)
CO2: 27 mmol/L (ref 22–32)
Calcium: 8.7 mg/dL — ABNORMAL LOW (ref 8.9–10.3)
Chloride: 103 mmol/L (ref 98–111)
Creatinine, Ser: 1.01 mg/dL (ref 0.61–1.24)
GFR, Estimated: >60 mL/min (ref >60)
Glucose, Bld: 134 mg/dL — ABNORMAL HIGH (ref 70–99)
Potassium: 3.8 mmol/L (ref 3.5–5.1)
Sodium: 141 mmol/L (ref 135–145)
Total Bilirubin: 1.3 mg/dL — ABNORMAL HIGH (ref 0.3–1.2)
Total Protein: 6.1 g/dL — ABNORMAL LOW (ref 6.5–8.1)

## 2022-03-31 LAB — CBC WITH DIFFERENTIAL/PLATELET
Abs Immature Granulocytes: 0.06 10*3/uL (ref 0.00–0.07)
Basophils Absolute: 0.1 10*3/uL (ref 0.0–0.1)
Basophils Relative: 1 %
Eosinophils Absolute: 0.2 10*3/uL (ref 0.0–0.5)
Eosinophils Relative: 1 %
HCT: 34.3 % — ABNORMAL LOW (ref 39.0–52.0)
Hemoglobin: 10.8 g/dL — ABNORMAL LOW (ref 13.0–17.0)
Immature Granulocytes: 0 %
Lymphocytes Relative: 6 %
Lymphs Abs: 0.9 10*3/uL (ref 0.7–4.0)
MCH: 29.2 pg (ref 26.0–34.0)
MCHC: 31.5 g/dL (ref 30.0–36.0)
MCV: 92.7 fL (ref 80.0–100.0)
Monocytes Absolute: 1.2 10*3/uL — ABNORMAL HIGH (ref 0.1–1.0)
Monocytes Relative: 8 %
Neutro Abs: 12.7 10*3/uL — ABNORMAL HIGH (ref 1.7–7.7)
Neutrophils Relative %: 84 %
Platelets: 371 10*3/uL (ref 150–400)
RBC: 3.7 MIL/uL — ABNORMAL LOW (ref 4.22–5.81)
RDW: 16.3 % — ABNORMAL HIGH (ref 11.5–15.5)
WBC: 15.2 10*3/uL — ABNORMAL HIGH (ref 4.0–10.5)
nRBC: 0 % (ref 0.0–0.2)

## 2022-03-31 LAB — MAGNESIUM: Magnesium: 2 mg/dL (ref 1.7–2.4)

## 2022-03-31 MED ORDER — DIPHENHYDRAMINE HCL 50 MG/ML IJ SOLN
25.0000 mg | Freq: Once | INTRAMUSCULAR | Status: AC
  Administered 2022-03-31: 25 mg via INTRAVENOUS
  Filled 2022-03-31: qty 1

## 2022-03-31 MED ORDER — SODIUM CHLORIDE 0.9 % IV SOLN
20.0000 mg/m2 | Freq: Once | INTRAVENOUS | Status: AC
  Administered 2022-03-31: 45 mg via INTRAVENOUS
  Filled 2022-03-31: qty 4.5

## 2022-03-31 MED ORDER — DENOSUMAB 120 MG/1.7ML ~~LOC~~ SOLN
120.0000 mg | Freq: Once | SUBCUTANEOUS | Status: AC
  Administered 2022-03-31: 120 mg via SUBCUTANEOUS
  Filled 2022-03-31: qty 1.7

## 2022-03-31 MED ORDER — PEGFILGRASTIM 6 MG/0.6ML ~~LOC~~ PSKT
6.0000 mg | PREFILLED_SYRINGE | Freq: Once | SUBCUTANEOUS | Status: AC
  Administered 2022-03-31: 6 mg via SUBCUTANEOUS
  Filled 2022-03-31: qty 0.6

## 2022-03-31 MED ORDER — SODIUM CHLORIDE 0.9 % IV SOLN: Freq: Once | INTRAVENOUS | Status: AC

## 2022-03-31 MED ORDER — SODIUM CHLORIDE 0.9 % IV SOLN: 8.0000 mg | Freq: Once | INTRAVENOUS | Status: DC

## 2022-03-31 MED ORDER — ONDANSETRON HCL 4 MG/2ML IJ SOLN
8.0000 mg | Freq: Once | INTRAMUSCULAR | Status: AC
  Administered 2022-03-31: 8 mg via INTRAVENOUS
  Filled 2022-03-31: qty 4

## 2022-03-31 MED ORDER — SODIUM CHLORIDE 0.9% FLUSH
10.0000 mL | INTRAVENOUS | Status: DC | PRN
  Administered 2022-03-31: 10 mL

## 2022-03-31 MED ORDER — FAMOTIDINE IN NACL 20-0.9 MG/50ML-% IV SOLN
20.0000 mg | Freq: Once | INTRAVENOUS | Status: AC
  Administered 2022-03-31: 20 mg via INTRAVENOUS
  Filled 2022-03-31: qty 50

## 2022-03-31 MED ORDER — HEPARIN SOD (PORK) LOCK FLUSH 100 UNIT/ML IV SOLN
500.0000 [IU] | Freq: Once | INTRAVENOUS | Status: AC | PRN
  Administered 2022-03-31: 500 [IU]

## 2022-03-31 MED ORDER — SODIUM CHLORIDE 0.9% FLUSH
10.0000 mL | INTRAVENOUS | Status: DC
  Administered 2022-03-31: 10 mL

## 2022-03-31 MED ORDER — SODIUM CHLORIDE 0.9 % IV SOLN
10.0000 mg | Freq: Once | INTRAVENOUS | Status: AC
  Administered 2022-03-31: 10 mg via INTRAVENOUS
  Filled 2022-03-31: qty 10

## 2022-03-31 NOTE — Patient Instructions (Signed)
[facility]  Discharge Instructions: Thank you for choosing [facility] to provide your oncology and hematology care.  If you have a lab appointment with the [facility], please come in thru the Main Entrance and check in at the main information desk.  Wear comfortable clothing and clothing appropriate for easy access to any Portacath or PICC line.   We strive to give you quality time with your provider. You may need to reschedule your appointment if you arrive late (15 or more minutes).  Arriving late affects you and other patients whose appointments are after yours.  Also, if you miss three or more appointments without notifying the office, you may be dismissed from the clinic at the provider's discretion.      For prescription refill requests, have your pharmacy contact our office and allow 72 hours for refills to be completed.    Today you received the following chemotherapy and/or immunotherapy agents Jevtana. Cabazitaxel Injection What is this medication? CABAZITAXEL (ka BAZ i TAX el) treats prostate cancer. It works by slowing down the growth of cancer cells. This medicine may be used for other purposes; ask your health care provider or pharmacist if you have questions. COMMON BRAND NAME(S): Jevtana What should I tell my care team before I take this medication? They need to know if you have any of these conditions: Kidney problems Liver disease Low white blood cell levels Lung disease Stomach or intestine problems An unusual or allergic reaction to cabazitaxel, polysorbate 80, other medications, foods, dyes, or preservatives Pregnant or trying to get pregnant Breast-feeding How should I use this medication? This medication is injected into a vein. It is given by your care team in a hospital or clinic setting. Talk to your care team about the use of this medication in children. Special care may be needed. Overdosage: If you think you have taken  too much of this medicine contact a poison control center or emergency room at once. NOTE: This medicine is only for you. Do not share this medicine with others. What if I miss a dose? Keep appointments for follow-up doses. It is important not to miss your dose. Call your care team if you are unable to keep an appointment. What may interact with this medication? Certain antibiotics, such as clarithromycin or telithromycin Certain antivirals for HIV or AIDS Certain medications for fungal infections like ketoconazole, itraconazole, and voriconazole Nefazodone This list may not describe all possible interactions. Give your health care provider a list of all the medicines, herbs, non-prescription drugs, or dietary supplements you use. Also tell them if you smoke, drink alcohol, or use illegal drugs. Some items may interact with your medicine. What should I watch for while using this medication? This medication may make you feel generally unwell. This is not uncommon as chemotherapy can affect healthy cells as well as cancer cells. Report any side effects. Continue your course of treatment even though you feel ill unless your care team tells you to stop. You may need blood work while you are taking this medication. This medication may increase your risk of getting an infection. Call your care team for advice if you get a fever, chills, sore throat, or other symptoms of a cold or flu. Do not treat yourself. Try to avoid being around people who are sick. Avoid taking medications that contain aspirin, acetaminophen, ibuprofen, naproxen, or ketoprofen unless instructed by your care team. These medications may hide a fever. Be careful brushing or flossing  your teeth or using a toothpick because you may get an infection or bleed more easily. If you have any dental work done, tell your dentist you are receiving this medication. This medication can cause serious infusion reactions. To reduce the risk, your care  team may give you other medications to take before receiving this one. Be sure to follow the directions from your care team. Use a condom during sex while taking this medication and for 4 months after the last dose. Talk to your care team right away if your partner may be pregnant. This medication can cause serious birth defects. This medication may cause infertility. Talk to your care team if you are concerned about your fertility. What side effects may I notice from receiving this medication? Side effects that you should report to your care team as soon as possible: Allergic reactions--skin rash, itching, hives, swelling of the face, lips, tongue, or throat Diarrhea, nausea, vomiting Dry cough, shortness of breath or trouble breathing Infection--fever, chills, cough, or sore throat Kidney injury--decrease in the amount of urine, swelling of the ankles, hands, or feet Pain, tingling, or numbness in the hands or feet Red or dark brown urine Stomach bleeding--bloody or black, tar-like stools, vomiting blood or brown material that looks like coffee grounds Stomach pain that is severe, does not away, or gets worse Unusual bruising or bleeding Side effects that usually do not require medical attention (report these to your care team if they continue or are bothersome): Loss of appetite Unusual weakness or fatigue This list may not describe all possible side effects. Call your doctor for medical advice about side effects. You may report side effects to FDA at 1-800-FDA-1088. Where should I keep my medication? This medication is given in a hospital or clinic. It will not be stored at home. NOTE: This sheet is a summary. It may not cover all possible information. If you have questions about this medicine, talk to your doctor, pharmacist, or health care provider.  2023 Elsevier/Gold Standard (2008-11-21 00:00:00)       To help prevent nausea and vomiting after your treatment, we encourage you to  take your nausea medication as directed.  BELOW ARE SYMPTOMS THAT SHOULD BE REPORTED IMMEDIATELY: *FEVER GREATER THAN 100.4 F (38 C) OR HIGHER *CHILLS OR SWEATING *NAUSEA AND VOMITING THAT IS NOT CONTROLLED WITH YOUR NAUSEA MEDICATION *UNUSUAL SHORTNESS OF BREATH *UNUSUAL BRUISING OR BLEEDING *URINARY PROBLEMS (pain or burning when urinating, or frequent urination) *BOWEL PROBLEMS (unusual diarrhea, constipation, pain near the anus) TENDERNESS IN MOUTH AND THROAT WITH OR WITHOUT PRESENCE OF ULCERS (sore throat, sores in mouth, or a toothache) UNUSUAL RASH, SWELLING OR PAIN  UNUSUAL VAGINAL DISCHARGE OR ITCHING   Items with * indicate a potential emergency and should be followed up as soon as possible or go to the Emergency Department if any problems should occur.  Please show the CHEMOTHERAPY ALERT CARD or IMMUNOTHERAPY ALERT CARD at check-in to the Emergency Department and triage nurse.  Should you have questions after your visit or need to cancel or reschedule your appointment, please contact [facility] [phone]  and follow the prompts.  Office hours are 8:00 a.m. to 4:30 p.m. Monday - Friday. Please note that voicemails left after 4:00 p.m. may not be returned until the following business day.  We are closed weekends and major holidays. You have access to a nurse at all times for urgent questions. Please call the main number to the clinic [phone] and follow the  prompts.  For any non-urgent questions, you may also contact your provider using MyChart. We now offer e-Visits for anyone 31 and older to request care online for non-urgent symptoms. For details visit [email or web address].   Also download the MyChart app! Go to the app store, search "MyChart", open the app, select [facility], and log in with your MyChart username and password.  Masks are optional in the cancer centers. If you would like for your care team to wear a mask while they are taking care  of you, please let them know. You may have one support person who is at least 83 years old accompany you for your appointments.

## 2022-03-31 NOTE — Progress Notes (Signed)
Patient presents today for [facility] and follow up visit with Dr. Delton Coombes. Labs within parameters for treatment. Vital signs within parameters for treatment. Delton See given today see flowsheet.   Message received from A. Ouida Sills RN / Dr. Delton Coombes to proceed with treatment.  Treatment given today per MD orders. Tolerated infusion without adverse affects. Vital signs stable. No complaints at this time. Discharged from clinic by wheel chair in stable condition. Alert and oriented x 3. F/U with [facility] as scheduled.

## 2022-03-31 NOTE — Patient Instructions (Addendum)
[facility] at [facility] Discharge Instructions   You were seen and examined today by Dr. Delton Coombes.  He reviewed the results of your lab work which are normal/stable.   We will proceed with your treatment today.   We will repeat a CT scan and bone scan prior to your next visit.  Return as scheduled in 3-4 weeks to review results.    Thank you for choosing [facility] at [facility] to provide your oncology and hematology care.  To afford each patient quality time with our provider, please arrive at least 15 minutes before your scheduled appointment time.   If you have a lab appointment with the [facility] please come in thru the Main Entrance and check in at the main information desk.  You need to re-schedule your appointment should you arrive 10 or more minutes late.  We strive to give you quality time with our providers, and arriving late affects you and other patients whose appointments are after yours.  Also, if you no show three or more times for appointments you may be dismissed from the clinic at the providers discretion.     Again, thank you for choosing [facility].  Our hope is that these requests will decrease the amount of time that you wait before being seen by our physicians.       _____________________________________________________________  Should you have questions after your visit to [facility], please contact our office at [phone] and follow the prompts.  Our office hours are 8:00 a.m. and 4:30 p.m. Monday - Friday.  Please note that voicemails left after 4:00 p.m. may not be returned until the following business day.  We are closed weekends and major holidays.  You do have access to a nurse 24-7, just call the main number to the clinic [phone] and do not press any options, hold on the line and a nurse will answer the phone.    For prescription refill requests, have your pharmacy  contact our office and allow 72 hours.    Due to Covid, you will need to wear a mask upon entering the hospital. If you do not have a mask, a mask will be given to you at the Main Entrance upon arrival. For doctor visits, patients may have 1 support person age 70 or older with them. For treatment visits, patients can not have anyone with them due to social distancing guidelines and our immunocompromised population.

## 2022-03-31 NOTE — Progress Notes (Signed)
[facility] [place], [place] [address]   CLINIC:  Medical Oncology/Hematology  PCP:  [facility], [facility] [address] 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 Docetaxel x 14 cycles from 08/11/2015 through 06/12/2016. 2. Abiraterone from 07/13/2016 through 04/09/2019. 3. Enzalutamide from 04/12/2019 to 11/03/2019.  NGS Results: Foundation 1 MS--stable, TMB 4 Muts/Mb  CURRENT THERAPY: Cabazitaxel + Prednisone q21d  BRIEF ONCOLOGIC HISTORY:  Oncology History  Prostate cancer metastatic to intrathoracic lymph node ([place])  07/24/2015 Initial Diagnosis   Prostate cancer metastatic to the left supraclavicular and anterior mediastinal lymph nodes   08/21/2015 - 06/12/2016 Chemotherapy   Docetaxel every 3 weeks for 14 cycles, discontinued as PSA has plateaued around 11    07/13/2016 Treatment Plan Change   Zytiga 1000 milligrams daily along with prednisone 5 mg daily, started secondary to fluid overload from docetaxel   10/14/2018 Genetic Testing   Negative genetic testing.  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  The Common Hereditary Gene Panel offered by [facility] includes sequencing and/or deletion duplication testing of the following 48 genes: APC, ATM, AXIN2, BARD1, BMPR1A, BRCA1, BRCA2, BRIP1, CDH1, CDK4, CDKN2A (p14ARF), CDKN2A (p16INK4a), CHEK2, CTNNA1, DICER1, EPCAM (Deletion/duplication testing only), GREM1 (promoter region deletion/duplication testing only), KIT, MEN1, MLH1, MSH2, MSH3, MSH6, MUTYH, NBN, NF1, NHTL1, PALB2, PDGFRA, PMS2, POLD1, POLE, PTEN, RAD50, RAD51C, RAD51D, RNF43, SDHB, SDHC, SDHD, SMAD4, SMARCA4. STK11, TP53, TSC1, TSC2, and VHL.  The following genes were evaluated for sequence changes only: SDHA and HOXB13 c.251G>A variant only. The report date is Oct 14, 2018.   09/18/2019 Genetic Testing   Foundation One  CDx      12/12/2019 Genetic Testing   Guardant 360       12/15/2021 - 01/06/2022 Chemotherapy   Patient is on Treatment Plan : PROSTATE Cabazitaxel + Prednisone q21d     12/15/2021 -  Chemotherapy   Patient is on Treatment Plan : PROSTATE Cabazitaxel (20) D1 + Prednisone D1-21 q21d       CANCER STAGING:  Cancer Staging  No matching staging information was found for the patient.  INTERVAL HISTORY:  [name], a 81 y.o. male, here for toxicity assessment and cycle 6 of cabazitaxel.  He tolerated last cycle very well.  He reports stools are looser since last 1 month.  Denies any diarrhea.  Cough and shortness of breath from COPD stable.  Numbness in the toes is also stable.   REVIEW OF SYSTEMS:  Review of Systems  Respiratory:  Positive for cough and shortness of breath.   Neurological:  Positive for numbness.  All other systems reviewed and are negative.   PAST MEDICAL/SURGICAL HISTORY:  Past Medical History:  Diagnosis Date   COPD (chronic obstructive pulmonary disease) ([place])    Diabetes mellitus without complication (HCC)    Family history of ovarian cancer    Family history of prostate cancer    Hypertension    Prostate cancer ([place])    Past Surgical History:  Procedure Laterality Date   GOLD SEED IMPLANT      SOCIAL HISTORY:  Social History   Socioeconomic History   Marital status: Widowed    Spouse name: Not on file   Number of children: Not on file   Years of education: Not on file   Highest education level: Not on file  Occupational History   Not on  file  Tobacco Use   Smoking status: Former    Packs/day: 0.25    Years: 60.00    Total pack years: 15.00    Types: Cigarettes    Quit date: 06/25/2014    Years since quitting: 7.7   Smokeless tobacco: Never   Tobacco comments:    smoked since young age, quit in 2016  Substance and Sexual Activity   Alcohol use: Not Currently   Drug use: No   Sexual activity: Not Currently  Other Topics  Concern   Not on file  Social History Narrative   Not on file   Social Determinants of Health   Financial Resource Strain: Low Risk  (05/21/2020)   Overall Financial Resource Strain (CARDIA)    Difficulty of Paying Living Expenses: Not hard at all  Food Insecurity: No Food Insecurity (05/21/2020)   Hunger Vital Sign    Worried About Running Out of Food in the Last Year: Never true    Ran Out of Food in the Last Year: Never true  Transportation Needs: No Transportation Needs (05/21/2020)   PRAPARE - Hydrologist (Medical): No    Lack of Transportation (Non-Medical): No  Physical Activity: Insufficiently Active (05/21/2020)   Exercise Vital Sign    Days of Exercise per Week: 2 days    Minutes of Exercise per Session: 20 min  Stress: No Stress Concern Present (05/21/2020)   [place]    Feeling of Stress : Not at all  Social Connections: Moderately Isolated (05/21/2020)   Social Connection and Isolation Panel [NHANES]    Frequency of Communication with Friends and Family: More than three times a week    Frequency of Social Gatherings with Friends and Family: More than three times a week    Attends Religious Services: 1 to 4 times per year    Active Member of [employer] or Organizations: No    Attends Archivist Meetings: Never    Marital Status: Widowed  Intimate Partner Violence: Not At Risk (05/21/2020)   Humiliation, Afraid, Rape, and Kick questionnaire    Fear of Current or Ex-Partner: No    Emotionally Abused: No    Physically Abused: No    Sexually Abused: No    FAMILY HISTORY:  Family History  Problem Relation Age of Onset   Ovarian cancer Mother 57   Heart attack Father    Prostate cancer Maternal Uncle 77   Cancer Cousin        pat cousin with unknown cancer   Colon cancer Neg Hx    Pancreatic cancer Neg Hx    Breast cancer Neg Hx     CURRENT MEDICATIONS:   Current Outpatient Medications  Medication Sig Dispense Refill   ACCU-CHEK GUIDE test strip      Accu-Chek Softclix Lancets lancets      acetaminophen (TYLENOL) 500 MG tablet Take 500 mg by mouth every 6 (six) hours as needed for moderate pain.     albuterol (ACCUNEB) 0.63 MG/3ML nebulizer solution Take 1 ampule by nebulization every 6 (six) hours as needed for wheezing.     albuterol (PROVENTIL) (2.5 MG/3ML) 0.083% nebulizer solution INHALE 3 ML IN NEBULIZER BY MOUTH FOUR TIMES A DAY AS NEEDED     amLODipine (NORVASC) 2.5 MG tablet Take 1 tablet (2.5 mg total) by mouth daily as needed. 30 tablet 0   aspirin 81 MG EC tablet Take 1 tablet by mouth daily.  atorvastatin (LIPITOR) 10 MG tablet TAKE 1 TABLET BY MOUTH EVERY DAY 90 tablet 3   azelastine (ASTELIN) 0.1 % nasal spray USE 2 SPRAYS IN EACH NOSTRIL TWICE A DAY FOR ALLERGIC RHINITIS     benzonatate (TESSALON) 200 MG capsule Take by mouth.     Blood Glucose Monitoring Suppl (ACCU-CHEK GUIDE) w/Device KIT      bumetanide (BUMEX) 1 MG tablet TAKE 1 AND 1/2 TABLETS(1.5 MG) BY MOUTH DAILY 45 tablet 6   CALCIUM PO Take 600 mg by mouth daily.      cetirizine (ZYRTEC) 10 MG tablet Take 1 tablet by mouth daily.     CHERATUSSIN AC 100-10 MG/5ML syrup Take 5 mLs by mouth daily as needed.   0   Cholecalciferol (VITAMIN D-3 PO) Take 1 tablet by mouth daily.     cyclobenzaprine (FLEXERIL) 10 MG tablet Take 10 mg by mouth as needed.   0   dicyclomine (BENTYL) 20 MG tablet Take 1 tablet by mouth 3 (three) times daily as needed.     dorzolamide (TRUSOPT) 2 % ophthalmic solution Place 1 drop into both eyes 3 (three) times daily.     ferrous sulfate 325 (65 FE) MG tablet Take 1 tablet by mouth daily.     fluticasone (FLONASE) 50 MCG/ACT nasal spray USE 2 SPRAY(S) IN EACH NOSTRIL ONCE DAILY FOR 30 DAYS 15.8 mL 12   guaifenesin (HUMIBID E) 400 MG TABS tablet Take 1 tablet by mouth 4 (four) times daily as needed.     ibuprofen (ADVIL,MOTRIN) 600 MG tablet  Take 600 mg by mouth every 6 (six) hours as needed.   0   Ipratropium-Albuterol (COMBIVENT) 20-100 MCG/ACT AERS respimat INHALE 1 PUFF BY MOUTH FOUR TIMES A DAY AS NEEDED COPD     latanoprost (XALATAN) 0.005 % ophthalmic solution INSTILL 1 DROP INTO AFFECTED EYE(S) BY OPHTHALMIC ROUTE ONCE DAILY INTHE EVENING     Latanoprostene Bunod (VYZULTA) 0.024 % SOLN Apply to eye.     Leuprolide Acetate, 4 Month, (ELIGARD) 30 MG injection 30 MG SUBCUTANEOUSLY Q90D PRN     losartan (COZAAR) 100 MG tablet Take 1 tablet by mouth daily.     magnesium oxide (MAG-OX) 400 MG tablet Take 1 tablet (400 mg total) by mouth daily. 90 tablet 2   metFORMIN (GLUCOPHAGE) 500 MG tablet Take 2 tablets (1,000 mg total) by mouth 2 (two) times daily with a meal. 120 tablet 3   montelukast (SINGULAIR) 10 MG tablet Take 10 mg by mouth at bedtime.     potassium chloride (MICRO-K) 10 MEQ CR capsule TAKE 4 CAPSULES BY MOUTH WITH FOOD. OPEN AND SPRINKLE ON FOOD 120 capsule 3   predniSONE (DELTASONE) 5 MG tablet Take 1 tablet (5 mg total) by mouth daily with breakfast. 30 tablet 3   prochlorperazine (COMPAZINE) 10 MG tablet Take 1 tablet (10 mg total) by mouth every 6 (six) hours as needed for nausea or vomiting. 60 tablet 3   sucralfate (CARAFATE) 1 g tablet Take 1 tablet by mouth 4 (four) times daily as needed.     TRELEGY ELLIPTA 100-62.5-25 MCG/INH AEPB INHALE 1 PUFF ONCE DAILY     vitamin C (ASCORBIC ACID) 500 MG tablet Take 500 mg by mouth daily.     No current facility-administered medications for this visit.   Facility-Administered Medications Ordered in Other Visits  Medication Dose Route Frequency Provider Last Rate Last Admin   denosumab (XGEVA) 120 MG/1.7ML injection  diphenhydrAMINE (BENADRYL) 50 MG/ML injection            famotidine (PEPCID) 20-0.9 MG/50ML-% IVPB            sodium chloride flush (NS) 0.9 % injection 10 mL  10 mL Intracatheter PRN Derek Jack, MD   10 mL at 03/31/22 1408     ALLERGIES:  Allergies  Allergen Reactions   Lisinopril Cough   Metoprolol Other (See Comments) and Cough    Increases frequency of cough Other reaction(s): Other (See Comments) Increases frequency of cough    PHYSICAL EXAM:  Performance status (ECOG): 1 - Symptomatic but completely ambulatory  There were no vitals filed for this visit.  Wt Readings from Last 3 Encounters:  03/31/22 242 lb 6.4 oz (110 kg)  03/10/22 243 lb 6.4 oz (110.4 kg)  02/18/22 240 lb 15.4 oz (109.3 kg)   Physical Exam Vitals reviewed.  Constitutional:      Appearance: Normal appearance. He is obese.  Cardiovascular:     Rate and Rhythm: Normal rate and regular rhythm.     Pulses: Normal pulses.     Heart sounds: Normal heart sounds.  Pulmonary:     Effort: Pulmonary effort is normal.     Breath sounds: Normal breath sounds.  Neurological:     General: No focal deficit present.     Mental Status: He is alert and oriented to person, place, and time.  Psychiatric:        Mood and Affect: Mood normal.        Behavior: Behavior normal.     LABORATORY DATA:  I have reviewed the labs as listed.     Latest Ref Rng & Units 03/31/2022    9:24 AM 03/10/2022    8:39 AM 02/18/2022    8:39 AM  CBC  WBC 4.0 - 10.5 K/uL 15.2  11.7  12.2   Hemoglobin 13.0 - 17.0 g/dL 10.8  11.0  10.8   Hematocrit 39.0 - 52.0 % 34.3  34.2  34.0   Platelets 150 - 400 K/uL 371  328  301       Latest Ref Rng & Units 03/31/2022    9:24 AM 03/10/2022    8:39 AM 02/18/2022    8:39 AM  CMP  Glucose 70 - 99 mg/dL 134  121  139   BUN 8 - 23 mg/dL _0 Creatinine 0.61 - 1.24 mg/dL 1.01  0.95  0.88   Sodium 135 - 145 mmol/L 141  143  141   Potassium 3.5 - 5.1 mmol/L 3.8  3.5  3.6   Chloride 98 - 111 mmol/L 103  107  107   CO2 22 - 32 mmol/L _1 Calcium 8.9 - 10.3 mg/dL 8.7  9.1  8.8   Total Protein 6.5 - 8.1 g/dL 6.1  6.5  6.4   Total Bilirubin 0.3 - 1.2 mg/dL 1.3  0.6  0.7   Alkaline Phos 38 - 126 U/L  64  54  62   AST 15 - 41 U/L _2 ALT 0 - 44 U/L _3 DIAGNOSTIC IMAGING:  I have independently reviewed the scans and discussed with the patient. No results found.   ASSESSMENT:  1.  Metastatic prostate cancer to the left supraclavicular and mediastinal lymph nodes: -Docetaxel for 14 cycles discontinued as his PSA plateaued around 11 from 08/11/2015  through 06/12/2016. -Abiraterone and prednisone from 07/13/2016 through 04/09/2019, discontinued secondary to PSA progression. -Enzalutamide from 04/12/2019 through 11/03/2019 with progression. -Last Lupron on 06/09/2019. -CT CAP on 11/10/2019 shows no findings of active malignancy.  Stable small sclerotic lesions at T4 and T10, nonspecific.  Right hydronephrosis with right proximal hydroureter extending down to a 0.8 x 0.8 x 0.4 cm proximal ureteral stone at L3 vertical level.  Nonobstructive right and possibly left nephrolithiasis. -Bone scan on 11/10/2019 shows right proximal tibial metaphyseal activity from recent trauma.  Degenerative findings in the spine, right sternoclavicular joint, shoulders and right foot.  No evidence of metastatic disease. -Foundation 1 test shows MS-stable.  AR amplification.  Sensitivity is reduced due to sample quality. -Guardant 360 on 12/12/2019 MSI high not detected.  TMB was not evaluable.  No other targetable mutations. -F-18-PYLARIFY PET scan showed intense radiotracer activity in the left supraclavicular and prevascular lymph nodes, measuring 10 mm.  Cluster of small prevascular lymph nodes measures 5-7 mm each with SUV 54.  AP window lymph node measuring 8 mm.  No activity in the prostate gland or abdominal or pelvic lymph nodes.  No skeletal activity. - SBRT to the left supraclavicular lymph node and prevascular mediastinal lymph node from 08/27/2020 through 09/06/2020, 40 Gray in 5 fractions. - CT CAP (10/22/2021): [facility] at [facility]: No lymphadenopathy.  Cirrhosis.  Intrahepatic and extrahepatic  biliary ductal dilatation. - Bone scan (10/28/2021): New focal abnormal tracer uptake at the inferior aspect of the right scapula. - PSMA PET scan (11/13/2021): Interval development of multifocal tracer avid bone metastasis, only 1 of these lesions is visible on the recent nuclear medicine bone scan dated 10/28/2021.  New tracer avid right axillary, subcarinal, left paratracheal, left retrocrural and upper abdominal lymph nodes. - Cycle 1 of cabazitaxel on 12/15/2021, cycle 2 on 01/06/2022   2.  Androgen deprivation therapy induced bone loss: -Received Zometa every 3 months for a year completed on 10/29/2017.   3.  Genetic testing: -Germline mutation testing was negative.    PLAN:  1.  Metastatic prostate cancer to the left supraclavicular and mediastinal lymph nodes: - He has tolerated cycle 5 of cabazitaxel very well. - Reported loose stools but no increase in the number. - Reviewed labs which show normal LFTs with mildly elevated total bilirubin 1.3.  CBC was grossly normal. - PSA improved to 8.8 today. - Proceed with cycle 6 today.  RTC 3 to 4 weeks.  Schedule CT CAP and bone scan prior to next visit.   2.  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  He may receive denosumab today and in 6 weeks.   3.  Leg swelling: - Continue Bumex 1 and half tablets daily.  Mild swelling is stable.   4.  Peripheral neuropathy: - Feet feel cold in the bottom and occasionally numb. - No worsening since the start of cabazitaxel.  5.  Hypomagnesemia: - Continue magnesium 1 tablet daily.  Magnesium today is 2.0.   Orders placed this encounter:  No orders of the defined types were placed in this encounter.     Derek Jack, MD [place] [phone]

## 2022-04-02 ENCOUNTER — Other Ambulatory Visit: Payer: Self-pay

## 2022-04-05 ENCOUNTER — Other Ambulatory Visit: Payer: Self-pay

## 2022-04-13 ENCOUNTER — Ambulatory Visit (HOSPITAL_COMMUNITY)
Admission: RE | Admit: 2022-04-13 | Discharge: 2022-04-13 | Disposition: A | Discharge status: Home / Self Care | Payer: Medicare PPO | Source: Ambulatory Visit | Attending: Hematology | Admitting: Hematology

## 2022-04-13 ENCOUNTER — Encounter (HOSPITAL_COMMUNITY)
Admission: RE | Admit: 2022-04-13 | Discharge: 2022-04-13 | Disposition: A | Discharge status: Home / Self Care | Payer: Medicare PPO | Source: Ambulatory Visit | Attending: Hematology | Admitting: Hematology

## 2022-04-13 DIAGNOSIS — C771 Secondary and unspecified malignant neoplasm of intrathoracic lymph nodes: Secondary | ICD-10-CM | POA: Insufficient documentation

## 2022-04-13 DIAGNOSIS — C61 Malignant neoplasm of prostate: Secondary | ICD-10-CM | POA: Insufficient documentation

## 2022-04-13 MED ORDER — IOHEXOL 9 MG/ML PO SOLN
ORAL | Status: AC
  Filled 2022-04-13: qty 1000

## 2022-04-13 MED ORDER — IOHEXOL 300 MG/ML  SOLN
100.0000 mL | Freq: Once | INTRAMUSCULAR | Status: AC | PRN
  Administered 2022-04-13: 100 mL via INTRAVENOUS

## 2022-04-13 MED ORDER — TECHNETIUM TC 99M MEDRONATE IV KIT
20.0000 | PACK | Freq: Once | INTRAVENOUS | Status: AC | PRN
  Administered 2022-04-13: 19 via INTRAVENOUS

## 2022-04-21 ENCOUNTER — Inpatient Hospital Stay: Payer: Medicare PPO | Admitting: Hematology

## 2022-04-21 ENCOUNTER — Inpatient Hospital Stay: Payer: Medicare PPO

## 2022-04-22 ENCOUNTER — Other Ambulatory Visit: Payer: Self-pay

## 2022-04-27 ENCOUNTER — Inpatient Hospital Stay (HOSPITAL_BASED_OUTPATIENT_CLINIC_OR_DEPARTMENT_OTHER): Payer: Medicare PPO | Admitting: Hematology

## 2022-04-27 ENCOUNTER — Inpatient Hospital Stay: Payer: Medicare PPO | Attending: Hematology

## 2022-04-27 ENCOUNTER — Inpatient Hospital Stay: Payer: Medicare PPO

## 2022-04-27 VITALS — BP 141/76 | HR 65 | Temp 98.0°F | Resp 18

## 2022-04-27 DIAGNOSIS — Z8041 Family history of malignant neoplasm of ovary: Secondary | ICD-10-CM | POA: Insufficient documentation

## 2022-04-27 DIAGNOSIS — Z79899 Other long term (current) drug therapy: Secondary | ICD-10-CM | POA: Diagnosis not present

## 2022-04-27 DIAGNOSIS — Z8042 Family history of malignant neoplasm of prostate: Secondary | ICD-10-CM | POA: Insufficient documentation

## 2022-04-27 DIAGNOSIS — C61 Malignant neoplasm of prostate: Secondary | ICD-10-CM | POA: Insufficient documentation

## 2022-04-27 DIAGNOSIS — Z5111 Encounter for antineoplastic chemotherapy: Secondary | ICD-10-CM | POA: Diagnosis present

## 2022-04-27 DIAGNOSIS — Z5189 Encounter for other specified aftercare: Secondary | ICD-10-CM | POA: Diagnosis not present

## 2022-04-27 DIAGNOSIS — Z7982 Long term (current) use of aspirin: Secondary | ICD-10-CM | POA: Insufficient documentation

## 2022-04-27 DIAGNOSIS — Z87891 Personal history of nicotine dependence: Secondary | ICD-10-CM | POA: Insufficient documentation

## 2022-04-27 DIAGNOSIS — G629 Polyneuropathy, unspecified: Secondary | ICD-10-CM | POA: Insufficient documentation

## 2022-04-27 DIAGNOSIS — C771 Secondary and unspecified malignant neoplasm of intrathoracic lymph nodes: Secondary | ICD-10-CM | POA: Insufficient documentation

## 2022-04-27 DIAGNOSIS — C7951 Secondary malignant neoplasm of bone: Secondary | ICD-10-CM | POA: Insufficient documentation

## 2022-04-27 LAB — CBC WITH DIFFERENTIAL/PLATELET
Abs Immature Granulocytes: 0.04 10*3/uL (ref 0.00–0.07)
Basophils Absolute: 0.1 10*3/uL (ref 0.0–0.1)
Basophils Relative: 1 %
Eosinophils Absolute: 0 10*3/uL (ref 0.0–0.5)
Eosinophils Relative: 0 %
HCT: 34.9 % — ABNORMAL LOW (ref 39.0–52.0)
Hemoglobin: 11 g/dL — ABNORMAL LOW (ref 13.0–17.0)
Immature Granulocytes: 0 %
Lymphocytes Relative: 11 %
Lymphs Abs: 1 10*3/uL (ref 0.7–4.0)
MCH: 29.3 pg (ref 26.0–34.0)
MCHC: 31.5 g/dL (ref 30.0–36.0)
MCV: 92.8 fL (ref 80.0–100.0)
Monocytes Absolute: 1.2 10*3/uL — ABNORMAL HIGH (ref 0.1–1.0)
Monocytes Relative: 13 %
Neutro Abs: 6.9 10*3/uL (ref 1.7–7.7)
Neutrophils Relative %: 75 %
Platelets: 382 10*3/uL (ref 150–400)
RBC: 3.76 MIL/uL — ABNORMAL LOW (ref 4.22–5.81)
RDW: 15.7 % — ABNORMAL HIGH (ref 11.5–15.5)
WBC: 9.3 10*3/uL (ref 4.0–10.5)
nRBC: 0 % (ref 0.0–0.2)

## 2022-04-27 LAB — COMPREHENSIVE METABOLIC PANEL
ALT: 15 U/L (ref 0–44)
AST: 18 U/L (ref 15–41)
Albumin: 3.6 g/dL (ref 3.5–5.0)
Alkaline Phosphatase: 44 U/L (ref 38–126)
Anion gap: 10 (ref 5–15)
BUN: 19 mg/dL (ref 8–23)
CO2: 27 mmol/L (ref 22–32)
Calcium: 8.8 mg/dL — ABNORMAL LOW (ref 8.9–10.3)
Chloride: 103 mmol/L (ref 98–111)
Creatinine, Ser: 1.01 mg/dL (ref 0.61–1.24)
GFR, Estimated: >60 mL/min (ref >60)
Glucose, Bld: 152 mg/dL — ABNORMAL HIGH (ref 70–99)
Potassium: 3.5 mmol/L (ref 3.5–5.1)
Sodium: 140 mmol/L (ref 135–145)
Total Bilirubin: 0.8 mg/dL (ref 0.3–1.2)
Total Protein: 6 g/dL — ABNORMAL LOW (ref 6.5–8.1)

## 2022-04-27 LAB — PSA: Prostatic Specific Antigen: 8.18 ng/mL — ABNORMAL HIGH (ref 0.00–4.00)

## 2022-04-27 LAB — MAGNESIUM: Magnesium: 2 mg/dL (ref 1.7–2.4)

## 2022-04-27 MED ORDER — SODIUM CHLORIDE 0.9 % IV SOLN
10.0000 mg | Freq: Once | INTRAVENOUS | Status: AC
  Administered 2022-04-27: 10 mg via INTRAVENOUS
  Filled 2022-04-27: qty 10

## 2022-04-27 MED ORDER — PEGFILGRASTIM 6 MG/0.6ML ~~LOC~~ PSKT
6.0000 mg | PREFILLED_SYRINGE | Freq: Once | SUBCUTANEOUS | Status: AC
  Administered 2022-04-27: 6 mg via SUBCUTANEOUS
  Filled 2022-04-27: qty 0.6

## 2022-04-27 MED ORDER — ALTEPLASE 2 MG IJ SOLR
2.0000 mg | Freq: Once | INTRAMUSCULAR | Status: AC
  Administered 2022-04-27: 2 mg

## 2022-04-27 MED ORDER — SODIUM CHLORIDE 0.9 % IV SOLN
20.0000 mg/m2 | Freq: Once | INTRAVENOUS | Status: AC
  Administered 2022-04-27: 45 mg via INTRAVENOUS
  Filled 2022-04-27: qty 4.5

## 2022-04-27 MED ORDER — SODIUM CHLORIDE 0.9 % IV SOLN: Freq: Once | INTRAVENOUS | Status: AC

## 2022-04-27 MED ORDER — DIPHENHYDRAMINE HCL 50 MG/ML IJ SOLN
25.0000 mg | Freq: Once | INTRAMUSCULAR | Status: AC
  Administered 2022-04-27: 25 mg via INTRAVENOUS
  Filled 2022-04-27: qty 1

## 2022-04-27 MED ORDER — SODIUM CHLORIDE 0.9 % IV SOLN: 8.0000 mg | Freq: Once | INTRAVENOUS | Status: DC

## 2022-04-27 MED ORDER — ONDANSETRON HCL 4 MG/2ML IJ SOLN
8.0000 mg | Freq: Once | INTRAMUSCULAR | Status: AC
  Administered 2022-04-27: 8 mg via INTRAVENOUS
  Filled 2022-04-27: qty 4

## 2022-04-27 MED ORDER — HEPARIN SOD (PORK) LOCK FLUSH 100 UNIT/ML IV SOLN
500.0000 [IU] | Freq: Once | INTRAVENOUS | Status: AC | PRN
  Administered 2022-04-27: 500 [IU]

## 2022-04-27 MED ORDER — SODIUM CHLORIDE 0.9% FLUSH
10.0000 mL | INTRAVENOUS | Status: DC | PRN
  Administered 2022-04-27 (×2): 10 mL

## 2022-04-27 MED ORDER — FAMOTIDINE IN NACL 20-0.9 MG/50ML-% IV SOLN
20.0000 mg | Freq: Once | INTRAVENOUS | Status: AC
  Administered 2022-04-27: 20 mg via INTRAVENOUS
  Filled 2022-04-27: qty 50

## 2022-04-27 NOTE — Patient Instructions (Signed)
[facility]  Discharge Instructions: Thank you for choosing [facility] to provide your oncology and hematology care.  If you have a lab appointment with the [facility], please come in thru the Main Entrance and check in at the main information desk.  Wear comfortable clothing and clothing appropriate for easy access to any Portacath or PICC line.   We strive to give you quality time with your provider. You may need to reschedule your appointment if you arrive late (15 or more minutes).  Arriving late affects you and other patients whose appointments are after yours.  Also, if you miss three or more appointments without notifying the office, you may be dismissed from the clinic at the provider's discretion.      For prescription refill requests, have your pharmacy contact our office and allow 72 hours for refills to be completed.    Today you received the following chemotherapy and/or immunotherapy agents Jevtana, return as scheduled.  To help prevent nausea and vomiting after your treatment, we encourage you to take your nausea medication as directed.  BELOW ARE SYMPTOMS THAT SHOULD BE REPORTED IMMEDIATELY: *FEVER GREATER THAN 100.4 F (38 C) OR HIGHER *CHILLS OR SWEATING *NAUSEA AND VOMITING THAT IS NOT CONTROLLED WITH YOUR NAUSEA MEDICATION *UNUSUAL SHORTNESS OF BREATH *UNUSUAL BRUISING OR BLEEDING *URINARY PROBLEMS (pain or burning when urinating, or frequent urination) *BOWEL PROBLEMS (unusual diarrhea, constipation, pain near the anus) TENDERNESS IN MOUTH AND THROAT WITH OR WITHOUT PRESENCE OF ULCERS (sore throat, sores in mouth, or a toothache) UNUSUAL RASH, SWELLING OR PAIN  UNUSUAL VAGINAL DISCHARGE OR ITCHING   Items with * indicate a potential emergency and should be followed up as soon as possible or go to the Emergency Department if any problems should occur.  Please show the CHEMOTHERAPY ALERT CARD or IMMUNOTHERAPY ALERT CARD at  check-in to the Emergency Department and triage nurse.  Should you have questions after your visit or need to cancel or reschedule your appointment, please contact [facility] [phone]  and follow the prompts.  Office hours are 8:00 a.m. to 4:30 p.m. Monday - Friday. Please note that voicemails left after 4:00 p.m. may not be returned until the following business day.  We are closed weekends and major holidays. You have access to a nurse at all times for urgent questions. Please call the main number to the clinic [phone] and follow the prompts.  For any non-urgent questions, you may also contact your provider using MyChart. We now offer e-Visits for anyone 45 and older to request care online for non-urgent symptoms. For details visit [email or web address].   Also download the MyChart app! Go to the app store, search "MyChart", open the app, select [facility], and log in with your MyChart username and password.  Masks are optional in the cancer centers. If you would like for your care team to wear a mask while they are taking care of you, please let them know. You may have one support person who is at least 83 years old accompany you for your appointments.

## 2022-04-27 NOTE — Progress Notes (Signed)
Patient has been examined by Dr. Katragadda, and vital signs and labs have been reviewed. ANC, Creatinine, LFTs, hemoglobin, and platelets are within treatment parameters per M.D. - pt may proceed with treatment.  Primary RN and pharmacy notified.  

## 2022-04-27 NOTE — Patient Instructions (Addendum)
[place] at [facility] Discharge Instructions   You were seen and examined today by Dr. Delton Coombes.  He reviewed the results of your lab work which are normal/stable.   He reviewed the results of your CT scan and bone scan which show improvement of the cancer with treatment. The lymph nodes have decreased in size.   We will proceed with your treatment today.  Return as scheduled.    Thank you for choosing [place] at [facility] to provide your oncology and hematology care.  To afford each patient quality time with our provider, please arrive at least 15 minutes before your scheduled appointment time.   If you have a lab appointment with the [facility] please come in thru the Main Entrance and check in at the main information desk.  You need to re-schedule your appointment should you arrive 10 or more minutes late.  We strive to give you quality time with our providers, and arriving late affects you and other patients whose appointments are after yours.  Also, if you no show three or more times for appointments you may be dismissed from the clinic at the providers discretion.     Again, thank you for choosing [facility].  Our hope is that these requests will decrease the amount of time that you wait before being seen by our physicians.       _____________________________________________________________  Should you have questions after your visit to [facility], please contact our office at [phone] and follow the prompts.  Our office hours are 8:00 a.m. and 4:30 p.m. Monday - Friday.  Please note that voicemails left after 4:00 p.m. may not be returned until the following business day.  We are closed weekends and major holidays.  You do have access to a nurse 24-7, just call the main number to the clinic [phone] and do not press any options, hold on the line and a nurse will answer the phone.    For  prescription refill requests, have your pharmacy contact our office and allow 72 hours.    Due to Covid, you will need to wear a mask upon entering the hospital. If you do not have a mask, a mask will be given to you at the Main Entrance upon arrival. For doctor visits, patients may have 1 support person age 82 or older with them. For treatment visits, patients can not have anyone with them due to social distancing guidelines and our immunocompromised population.

## 2022-04-27 NOTE — Progress Notes (Signed)
Patient tolerated chemotherapy with no complaints voiced. Side effects with management reviewed understanding verbalized. Port site clean and dry with no bruising or swelling noted at site. Good blood return noted before and after administration of chemotherapy. Band aid applied. Patient left in satisfactory condition with VSS and no s/s of distress noted. 

## 2022-04-27 NOTE — Progress Notes (Signed)
Patients port flushed without difficulty.  Good blood return noted with no bruising or swelling noted at site.  Stable during access and blood draw.  Patient to remain accessed for treatment. 

## 2022-04-28 ENCOUNTER — Other Ambulatory Visit (HOSPITAL_COMMUNITY): Payer: Self-pay | Admitting: Hematology

## 2022-04-28 ENCOUNTER — Other Ambulatory Visit: Payer: Self-pay

## 2022-04-28 MED ORDER — PREDNISONE 5 MG PO TABS: 5.0000 mg | ORAL_TABLET | Freq: Every day | ORAL | 3 refills | Status: DC

## 2022-05-12 ENCOUNTER — Ambulatory Visit: Payer: Medicare PPO | Admitting: Hematology

## 2022-05-12 ENCOUNTER — Other Ambulatory Visit: Payer: Medicare PPO

## 2022-05-12 ENCOUNTER — Encounter: Payer: Self-pay | Admitting: *Deleted

## 2022-05-12 ENCOUNTER — Ambulatory Visit: Payer: Medicare PPO

## 2022-05-20 ENCOUNTER — Inpatient Hospital Stay: Payer: Medicare PPO | Admitting: Hematology

## 2022-05-20 ENCOUNTER — Inpatient Hospital Stay: Payer: Medicare PPO

## 2022-05-20 VITALS — BP 139/82 | HR 72 | Temp 97.2°F | Resp 17 | Wt 244.0 lb

## 2022-05-20 DIAGNOSIS — C771 Secondary and unspecified malignant neoplasm of intrathoracic lymph nodes: Secondary | ICD-10-CM | POA: Diagnosis not present

## 2022-05-20 DIAGNOSIS — D500 Iron deficiency anemia secondary to blood loss (chronic): Secondary | ICD-10-CM | POA: Diagnosis not present

## 2022-05-20 DIAGNOSIS — C61 Malignant neoplasm of prostate: Secondary | ICD-10-CM | POA: Diagnosis not present

## 2022-05-20 DIAGNOSIS — Z95828 Presence of other vascular implants and grafts: Secondary | ICD-10-CM

## 2022-05-20 DIAGNOSIS — Z5111 Encounter for antineoplastic chemotherapy: Secondary | ICD-10-CM | POA: Diagnosis not present

## 2022-05-20 LAB — COMPREHENSIVE METABOLIC PANEL
ALT: 15 U/L (ref 0–44)
AST: 17 U/L (ref 15–41)
Albumin: 3.7 g/dL (ref 3.5–5.0)
Alkaline Phosphatase: 49 U/L (ref 38–126)
Anion gap: 9 (ref 5–15)
BUN: 16 mg/dL (ref 8–23)
CO2: 27 mmol/L (ref 22–32)
Calcium: 8.9 mg/dL (ref 8.9–10.3)
Chloride: 105 mmol/L (ref 98–111)
Creatinine, Ser: 0.98 mg/dL (ref 0.61–1.24)
GFR, Estimated: >60 mL/min (ref >60)
Glucose, Bld: 135 mg/dL — ABNORMAL HIGH (ref 70–99)
Potassium: 3.9 mmol/L (ref 3.5–5.1)
Sodium: 141 mmol/L (ref 135–145)
Total Bilirubin: 0.5 mg/dL (ref 0.3–1.2)
Total Protein: 6.1 g/dL — ABNORMAL LOW (ref 6.5–8.1)

## 2022-05-20 LAB — CBC WITH DIFFERENTIAL/PLATELET
Abs Immature Granulocytes: 0.04 10*3/uL (ref 0.00–0.07)
Basophils Absolute: 0.1 10*3/uL (ref 0.0–0.1)
Basophils Relative: 1 %
Eosinophils Absolute: 0 10*3/uL (ref 0.0–0.5)
Eosinophils Relative: 0 %
HCT: 33 % — ABNORMAL LOW (ref 39.0–52.0)
Hemoglobin: 10.4 g/dL — ABNORMAL LOW (ref 13.0–17.0)
Immature Granulocytes: 0 %
Lymphocytes Relative: 9 %
Lymphs Abs: 1 10*3/uL (ref 0.7–4.0)
MCH: 29.4 pg (ref 26.0–34.0)
MCHC: 31.5 g/dL (ref 30.0–36.0)
MCV: 93.2 fL (ref 80.0–100.0)
Monocytes Absolute: 1.1 10*3/uL — ABNORMAL HIGH (ref 0.1–1.0)
Monocytes Relative: 11 %
Neutro Abs: 8.1 10*3/uL — ABNORMAL HIGH (ref 1.7–7.7)
Neutrophils Relative %: 79 %
Platelets: 403 10*3/uL — ABNORMAL HIGH (ref 150–400)
RBC: 3.54 MIL/uL — ABNORMAL LOW (ref 4.22–5.81)
RDW: 16.1 % — ABNORMAL HIGH (ref 11.5–15.5)
WBC: 10.2 10*3/uL (ref 4.0–10.5)
nRBC: 0 % (ref 0.0–0.2)

## 2022-05-20 LAB — MAGNESIUM: Magnesium: 1.9 mg/dL (ref 1.7–2.4)

## 2022-05-20 LAB — IRON AND TIBC
Iron: 39 ug/dL — ABNORMAL LOW (ref 45–182)
Saturation Ratios: 12 % — ABNORMAL LOW (ref 17.9–39.5)
TIBC: 323 ug/dL (ref 250–450)
UIBC: 284 ug/dL

## 2022-05-20 LAB — PSA: Prostatic Specific Antigen: 5.8 ng/mL — ABNORMAL HIGH (ref 0.00–4.00)

## 2022-05-20 LAB — FERRITIN: Ferritin: 133 ng/mL (ref 24–336)

## 2022-05-20 MED ORDER — ONDANSETRON HCL 4 MG/2ML IJ SOLN
8.0000 mg | Freq: Once | INTRAMUSCULAR | Status: AC
  Administered 2022-05-20: 8 mg via INTRAVENOUS
  Filled 2022-05-20: qty 4

## 2022-05-20 MED ORDER — DIPHENHYDRAMINE HCL 50 MG/ML IJ SOLN
25.0000 mg | Freq: Once | INTRAMUSCULAR | Status: AC
  Administered 2022-05-20: 25 mg via INTRAVENOUS
  Filled 2022-05-20: qty 1

## 2022-05-20 MED ORDER — SODIUM CHLORIDE 0.9 % IV SOLN
300.0000 mg | Freq: Once | INTRAVENOUS | Status: AC
  Administered 2022-05-20: 300 mg via INTRAVENOUS
  Filled 2022-05-20: qty 300

## 2022-05-20 MED ORDER — SODIUM CHLORIDE 0.9 % IV SOLN: Freq: Once | INTRAVENOUS | Status: AC

## 2022-05-20 MED ORDER — SODIUM CHLORIDE 0.9 % IV SOLN
20.0000 mg/m2 | Freq: Once | INTRAVENOUS | Status: AC
  Administered 2022-05-20: 45 mg via INTRAVENOUS
  Filled 2022-05-20: qty 4.5

## 2022-05-20 MED ORDER — HEPARIN SOD (PORK) LOCK FLUSH 100 UNIT/ML IV SOLN
500.0000 [IU] | Freq: Once | INTRAVENOUS | Status: AC | PRN
  Administered 2022-05-20: 500 [IU]

## 2022-05-20 MED ORDER — DENOSUMAB 120 MG/1.7ML ~~LOC~~ SOLN
120.0000 mg | Freq: Once | SUBCUTANEOUS | Status: AC
  Administered 2022-05-20: 120 mg via SUBCUTANEOUS
  Filled 2022-05-20: qty 1.7

## 2022-05-20 MED ORDER — FAMOTIDINE IN NACL 20-0.9 MG/50ML-% IV SOLN
20.0000 mg | Freq: Once | INTRAVENOUS | Status: AC
  Administered 2022-05-20: 20 mg via INTRAVENOUS
  Filled 2022-05-20: qty 50

## 2022-05-20 MED ORDER — SODIUM CHLORIDE 0.9% FLUSH
10.0000 mL | INTRAVENOUS | Status: DC | PRN
  Administered 2022-05-20: 10 mL

## 2022-05-20 MED ORDER — HEPARIN SOD (PORK) LOCK FLUSH 100 UNIT/ML IV SOLN: 500.0000 [IU] | Freq: Once | INTRAVENOUS | Status: DC

## 2022-05-20 MED ORDER — SODIUM CHLORIDE 0.9% FLUSH
10.0000 mL | INTRAVENOUS | Status: DC | PRN
  Administered 2022-05-20: 10 mL via INTRAVENOUS

## 2022-05-20 MED ORDER — SODIUM CHLORIDE 0.9 % IV SOLN
10.0000 mg | Freq: Once | INTRAVENOUS | Status: AC
  Administered 2022-05-20: 10 mg via INTRAVENOUS
  Filled 2022-05-20: qty 10

## 2022-05-20 MED ORDER — PEGFILGRASTIM 6 MG/0.6ML ~~LOC~~ PSKT
6.0000 mg | PREFILLED_SYRINGE | Freq: Once | SUBCUTANEOUS | Status: AC
  Administered 2022-05-20: 6 mg via SUBCUTANEOUS
  Filled 2022-05-20: qty 0.6

## 2022-05-20 NOTE — Patient Instructions (Signed)
[place]  Discharge Instructions: Thank you for choosing [facility] to provide your oncology and hematology care.  If you have a lab appointment with the [facility], please come in thru the Main Entrance and check in at the main information desk.  Wear comfortable clothing and clothing appropriate for easy access to any Portacath or PICC line.   We strive to give you quality time with your provider. You may need to reschedule your appointment if you arrive late (15 or more minutes).  Arriving late affects you and other patients whose appointments are after yours.  Also, if you miss three or more appointments without notifying the office, you may be dismissed from the clinic at the provider's discretion.      For prescription refill requests, have your pharmacy contact our office and allow 72 hours for refills to be completed.    Today you received the following chemotherapy and/or immunotherapy agents Jevtana/Venofer.  Cabazitaxel Injection What is this medication? CABAZITAXEL (ka BAZ i TAX el) treats prostate cancer. It works by slowing down the growth of cancer cells. This medicine may be used for other purposes; ask your health care provider or pharmacist if you have questions. COMMON BRAND NAME(S): Jevtana What should I tell my care team before I take this medication? They need to know if you have any of these conditions: Kidney problems Liver disease Low white blood cell levels Lung disease Stomach or intestine problems An unusual or allergic reaction to cabazitaxel, polysorbate 80, other medications, foods, dyes, or preservatives Pregnant or trying to get pregnant Breast-feeding How should I use this medication? This medication is injected into a vein. It is given by your care team in a hospital or clinic setting. Talk to your care team about the use of this medication in children. Special care may be needed. Overdosage: If you think you  have taken too much of this medicine contact a poison control center or emergency room at once. NOTE: This medicine is only for you. Do not share this medicine with others. What if I miss a dose? Keep appointments for follow-up doses. It is important not to miss your dose. Call your care team if you are unable to keep an appointment. What may interact with this medication? Certain antibiotics, such as clarithromycin or telithromycin Certain antivirals for HIV or AIDS Certain medications for fungal infections like ketoconazole, itraconazole, and voriconazole Nefazodone This list may not describe all possible interactions. Give your health care provider a list of all the medicines, herbs, non-prescription drugs, or dietary supplements you use. Also tell them if you smoke, drink alcohol, or use illegal drugs. Some items may interact with your medicine. What should I watch for while using this medication? This medication may make you feel generally unwell. This is not uncommon as chemotherapy can affect healthy cells as well as cancer cells. Report any side effects. Continue your course of treatment even though you feel ill unless your care team tells you to stop. You may need blood work while you are taking this medication. This medication may increase your risk of getting an infection. Call your care team for advice if you get a fever, chills, sore throat, or other symptoms of a cold or flu. Do not treat yourself. Try to avoid being around people who are sick. Avoid taking medications that contain aspirin, acetaminophen, ibuprofen, naproxen, or ketoprofen unless instructed by your care team. These medications may hide a fever. Be careful brushing or  flossing your teeth or using a toothpick because you may get an infection or bleed more easily. If you have any dental work done, tell your dentist you are receiving this medication. This medication can cause serious infusion reactions. To reduce the risk,  your care team may give you other medications to take before receiving this one. Be sure to follow the directions from your care team. Use a condom during sex while taking this medication and for 4 months after the last dose. Talk to your care team right away if your partner may be pregnant. This medication can cause serious birth defects. This medication may cause infertility. Talk to your care team if you are concerned about your fertility. What side effects may I notice from receiving this medication? Side effects that you should report to your care team as soon as possible: Allergic reactions--skin rash, itching, hives, swelling of the face, lips, tongue, or throat Diarrhea, nausea, vomiting Dry cough, shortness of breath or trouble breathing Infection--fever, chills, cough, or sore throat Kidney injury--decrease in the amount of urine, swelling of the ankles, hands, or feet Pain, tingling, or numbness in the hands or feet Red or dark Saja Bartolini urine Stomach bleeding--bloody or black, tar-like stools, vomiting blood or Trinitey Roache material that looks like coffee grounds Stomach pain that is severe, does not away, or gets worse Unusual bruising or bleeding Side effects that usually do not require medical attention (report these to your care team if they continue or are bothersome): Loss of appetite Unusual weakness or fatigue This list may not describe all possible side effects. Call your doctor for medical advice about side effects. You may report side effects to FDA at 1-800-FDA-1088. Where should I keep my medication? This medication is given in a hospital or clinic. It will not be stored at home. NOTE: This sheet is a summary. It may not cover all possible information. If you have questions about this medicine, talk to your doctor, pharmacist, or health care provider.  2023 Elsevier/Gold Standard (2008-11-21 00:00:00)    Iron Sucrose Injection What is this medication? IRON SUCROSE (EYE ern  SOO krose) treats low levels of iron (iron deficiency anemia) in people with kidney disease. Iron is a mineral that plays an important role in making red blood cells, which carry oxygen from your lungs to the rest of your body. This medicine may be used for other purposes; ask your health care provider or pharmacist if you have questions. COMMON BRAND NAME(S): Venofer What should I tell my care team before I take this medication? They need to know if you have any of these conditions: Anemia not caused by low iron levels Heart disease High levels of iron in the blood Kidney disease Liver disease An unusual or allergic reaction to iron, other medications, foods, dyes, or preservatives Pregnant or trying to get pregnant Breastfeeding How should I use this medication? This medication is for infusion into a vein. It is given in a hospital or clinic setting. Talk to your care team about the use of this medication in children. While this medication may be prescribed for children as young as 2 years for selected conditions, precautions do apply. Overdosage: If you think you have taken too much of this medicine contact a poison control center or emergency room at once. NOTE: This medicine is only for you. Do not share this medicine with others. What if I miss a dose? Keep appointments for follow-up doses. It is important not to miss your dose. Call  your care team if you are unable to keep an appointment. What may interact with this medication? Do not take this medication with any of the following: Deferoxamine Dimercaprol Other iron products This medication may also interact with the following: Chloramphenicol Deferasirox This list may not describe all possible interactions. Give your health care provider a list of all the medicines, herbs, non-prescription drugs, or dietary supplements you use. Also tell them if you smoke, drink alcohol, or use illegal drugs. Some items may interact with your  medicine. What should I watch for while using this medication? Visit your care team regularly. Tell your care team if your symptoms do not start to get better or if they get worse. You may need blood work done while you are taking this medication. You may need to follow a special diet. Talk to your care team. Foods that contain iron include: whole grains/cereals, dried fruits, beans, or peas, leafy green vegetables, and organ meats (liver, kidney). What side effects may I notice from receiving this medication? Side effects that you should report to your care team as soon as possible: Allergic reactions--skin rash, itching, hives, swelling of the face, lips, tongue, or throat Low blood pressure--dizziness, feeling faint or lightheaded, blurry vision Shortness of breath Side effects that usually do not require medical attention (report to your care team if they continue or are bothersome): Flushing Headache Joint pain Muscle pain Nausea Pain, redness, or irritation at injection site This list may not describe all possible side effects. Call your doctor for medical advice about side effects. You may report side effects to FDA at 1-800-FDA-1088. Where should I keep my medication? This medication is given in a hospital or clinic and will not be stored at home. NOTE: This sheet is a summary. It may not cover all possible information. If you have questions about this medicine, talk to your doctor, pharmacist, or health care provider.  2023 Elsevier/Gold Standard (2020-08-22 00:00:00)        To help prevent nausea and vomiting after your treatment, we encourage you to take your nausea medication as directed.  BELOW ARE SYMPTOMS THAT SHOULD BE REPORTED IMMEDIATELY: *FEVER GREATER THAN 100.4 F (38 C) OR HIGHER *CHILLS OR SWEATING *NAUSEA AND VOMITING THAT IS NOT CONTROLLED WITH YOUR NAUSEA MEDICATION *UNUSUAL SHORTNESS OF BREATH *UNUSUAL BRUISING OR BLEEDING *URINARY PROBLEMS (pain or burning  when urinating, or frequent urination) *BOWEL PROBLEMS (unusual diarrhea, constipation, pain near the anus) TENDERNESS IN MOUTH AND THROAT WITH OR WITHOUT PRESENCE OF ULCERS (sore throat, sores in mouth, or a toothache) UNUSUAL RASH, SWELLING OR PAIN  UNUSUAL VAGINAL DISCHARGE OR ITCHING   Items with * indicate a potential emergency and should be followed up as soon as possible or go to the Emergency Department if any problems should occur.  Please show the CHEMOTHERAPY ALERT CARD or IMMUNOTHERAPY ALERT CARD at check-in to the Emergency Department and triage nurse.  Should you have questions after your visit or need to cancel or reschedule your appointment, please contact [facility] [phone]  and follow the prompts.  Office hours are 8:00 a.m. to 4:30 p.m. Monday - Friday. Please note that voicemails left after 4:00 p.m. may not be returned until the following business day.  We are closed weekends and major holidays. You have access to a nurse at all times for urgent questions. Please call the main number to the clinic [phone] and follow the prompts.  For any non-urgent questions, you may also contact your provider  using MyChart. We now offer e-Visits for anyone 31 and older to request care online for non-urgent symptoms. For details visit [email or web address].   Also download the MyChart app! Go to the app store, search "MyChart", open the app, select [facility], and log in with your MyChart username and password.

## 2022-05-20 NOTE — Progress Notes (Signed)
Patient presents today for chemotherapy infusion.  Patient is in satisfactory condition with no complaints voiced.  Vital signs are stable.  Labs reviewed by Dr. Delton Coombes during his office visit.  All labs are within treatment parameters.  Patient will receive Venofer 300 mg x one dose today per Dr. Delton Coombes.  We will proceed with treatment per MD orders.   Patient tolerated Xgeva injection with no complaints voiced.  Site clean and dry with no bruising or swelling noted.  No complaints of pain.    Patient tolerated treatment well with no complaints voiced.  Neulasta OnPro injection applied to R arm.   Iron infusion also tolerated well with no complaints voiced. Patient left via wheelchair in stable condition.  Vital signs stable at discharge.  Follow up as scheduled.

## 2022-05-20 NOTE — Progress Notes (Signed)
Andre [name], Andre [name] [id number]   CLINIC:  Medical Oncology/Hematology  PCP:  Clinic, Andre [name] [address] 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 Docetaxel x 14 cycles from 08/11/2015 through 06/12/2016. 2. Abiraterone from 07/13/2016 through 04/09/2019. 3. Enzalutamide from 04/12/2019 to 11/03/2019.  NGS Results: Foundation 1 MS--stable, TMB 4 Muts/Mb  CURRENT THERAPY: Cabazitaxel + Prednisone q21d  BRIEF ONCOLOGIC HISTORY:  Oncology History  Prostate cancer metastatic to intrathoracic lymph node ([place])  07/24/2015 Initial Diagnosis   Prostate cancer metastatic to the left supraclavicular and anterior mediastinal lymph nodes   08/21/2015 - 06/12/2016 Chemotherapy   Docetaxel every 3 weeks for 14 cycles, discontinued as PSA has plateaued around 11    07/13/2016 Treatment Plan Change   Zytiga 1000 milligrams daily along with prednisone 5 mg daily, started secondary to fluid overload from docetaxel   10/14/2018 Genetic Testing   Negative genetic testing.  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  The Common Hereditary Gene Panel offered by [facility] includes sequencing and/or deletion duplication testing of the following 48 genes: APC, ATM, AXIN2, BARD1, BMPR1A, BRCA1, BRCA2, BRIP1, CDH1, CDK4, CDKN2A (p14ARF), CDKN2A (p16INK4a), CHEK2, CTNNA1, DICER1, EPCAM (Deletion/duplication testing only), GREM1 (promoter region deletion/duplication testing only), KIT, MEN1, MLH1, MSH2, MSH3, MSH6, MUTYH, NBN, NF1, NHTL1, PALB2, PDGFRA, PMS2, POLD1, POLE, PTEN, RAD50, RAD51C, RAD51D, RNF43, SDHB, SDHC, SDHD, SMAD4, SMARCA4. STK11, TP53, TSC1, TSC2, and VHL.  The following genes were evaluated for sequence changes only: SDHA and HOXB13 c.251G>A variant only. The report date is Oct 14, 2018.   09/18/2019 Genetic Testing   Foundation One  CDx      12/12/2019 Genetic Testing   Guardant 360       12/15/2021 - 01/06/2022 Chemotherapy   Patient is on Treatment Plan : PROSTATE Cabazitaxel + Prednisone q21d     12/15/2021 -  Chemotherapy   Patient is on Treatment Plan : PROSTATE Cabazitaxel (20) D1 + Prednisone D1-21 q21d       CANCER STAGING:  Cancer Staging  No matching staging information was found for the patient.  INTERVAL HISTORY:  Andre [name], a 81 y.o. male, seen for follow-up and toxicity assessment for next cycle of cabazitaxel.  After last treatment, he felt slightly more tired for another day.  He is feels tired for 2 to 3 days after treatments.  He is taking magnesium twice daily.  REVIEW OF SYSTEMS:  Review of Systems  Respiratory:  Positive for cough.   Neurological:  Positive for numbness.  All other systems reviewed and are negative.   PAST MEDICAL/SURGICAL HISTORY:  Past Medical History:  Diagnosis Date   COPD (chronic obstructive pulmonary disease) ([place])    Diabetes mellitus without complication ([facility])    Family history of ovarian cancer    Family history of prostate cancer    Hypertension    Prostate cancer ([place])    Past Surgical History:  Procedure Laterality Date   GOLD SEED IMPLANT      SOCIAL HISTORY:  Social History   Socioeconomic History   Marital status: Widowed    Spouse name: Not on file   Number of children: Not on file   Years of education: Not on file   Highest education level: Not on file  Occupational History   Not on file  Tobacco Use   Smoking status: Former  Packs/day: 0.25    Years: 60.00    Total pack years: 15.00    Types: Cigarettes    Quit date: 06/25/2014    Years since quitting: 7.9   Smokeless tobacco: Never   Tobacco comments:    smoked since young age, quit in 2016  Substance and Sexual Activity   Alcohol use: Not Currently   Drug use: No   Sexual activity: Not Currently  Other Topics Concern   Not on file  Social History Narrative    Not on file   Social Determinants of Health   Financial Resource Strain: Low Risk  (05/21/2020)   Overall Financial Resource Strain (CARDIA)    Difficulty of Paying Living Expenses: Not hard at all  Food Insecurity: No Food Insecurity (05/21/2020)   Hunger Vital Sign    Worried About Running Out of Food in the Last Year: Never true    Ran Out of Food in the Last Year: Never true  Transportation Needs: No Transportation Needs (05/21/2020)   PRAPARE - Hydrologist (Medical): No    Lack of Transportation (Non-Medical): No  Physical Activity: Insufficiently Active (05/21/2020)   Exercise Vital Sign    Days of Exercise per Week: 2 days    Minutes of Exercise per Session: 20 min  Stress: No Stress Concern Present (05/21/2020)   [place]    Feeling of Stress : Not at all  Social Connections: Moderately Isolated (05/21/2020)   Social Connection and Isolation Panel [NHANES]    Frequency of Communication with Friends and Family: More than three times a week    Frequency of Social Gatherings with Friends and Family: More than three times a week    Attends Religious Services: 1 to 4 times per year    Active Member of [employer] or Organizations: No    Attends Archivist Meetings: Never    Marital Status: Widowed  Intimate Partner Violence: Not At Risk (05/21/2020)   Humiliation, Afraid, Rape, and Kick questionnaire    Fear of Current or Ex-Partner: No    Emotionally Abused: No    Physically Abused: No    Sexually Abused: No    FAMILY HISTORY:  Family History  Problem Relation Age of Onset   Ovarian cancer Mother 27   Heart attack Father    Prostate cancer Maternal Uncle 17   Cancer Cousin        pat cousin with unknown cancer   Colon cancer Neg Hx    Pancreatic cancer Neg Hx    Breast cancer Neg Hx     CURRENT MEDICATIONS:  Current Outpatient Medications  Medication Sig  Dispense Refill   ACCU-CHEK GUIDE test strip      Accu-Chek Softclix Lancets lancets      acetaminophen (TYLENOL) 500 MG tablet Take 500 mg by mouth every 6 (six) hours as needed for moderate pain.     albuterol (ACCUNEB) 0.63 MG/3ML nebulizer solution Take 1 ampule by nebulization every 6 (six) hours as needed for wheezing.     albuterol (PROVENTIL) (2.5 MG/3ML) 0.083% nebulizer solution INHALE 3 ML IN NEBULIZER BY MOUTH FOUR TIMES A DAY AS NEEDED     amLODipine (NORVASC) 2.5 MG tablet Take 1 tablet (2.5 mg total) by mouth daily as needed. 30 tablet 0   aspirin 81 MG EC tablet Take 1 tablet by mouth daily.     atorvastatin (LIPITOR) 10 MG tablet TAKE 1 TABLET BY  MOUTH EVERY DAY (Patient taking differently: Take 5 mg by mouth daily.) 90 tablet 3   azelastine (ASTELIN) 0.1 % nasal spray USE 2 SPRAYS IN EACH NOSTRIL TWICE A DAY FOR ALLERGIC RHINITIS     benzonatate (TESSALON) 200 MG capsule Take by mouth.     Blood Glucose Monitoring Suppl (ACCU-CHEK GUIDE) w/Device KIT      bumetanide (BUMEX) 1 MG tablet TAKE 1 AND 1/2 TABLETS(1.5 MG) BY MOUTH DAILY 45 tablet 6   CALCIUM PO Take 600 mg by mouth daily.      cetirizine (ZYRTEC) 10 MG tablet Take 1 tablet by mouth daily.     CHERATUSSIN AC 100-10 MG/5ML syrup Take 5 mLs by mouth daily as needed.   0   Cholecalciferol (VITAMIN D3) 50 MCG (2000 UT) capsule Take 2,000 Units by mouth daily.     cyclobenzaprine (FLEXERIL) 10 MG tablet Take 10 mg by mouth as needed.   0   dicyclomine (BENTYL) 20 MG tablet Take 1 tablet by mouth 3 (three) times daily as needed.     dorzolamide (TRUSOPT) 2 % ophthalmic solution Place 1 drop into both eyes 3 (three) times daily.     ferrous sulfate 325 (65 FE) MG tablet Take 1 tablet by mouth daily.     fluticasone (FLONASE) 50 MCG/ACT nasal spray USE 2 SPRAY(S) IN EACH NOSTRIL ONCE DAILY FOR 30 DAYS 15.8 mL 12   guaifenesin (HUMIBID E) 400 MG TABS tablet Take 1 tablet by mouth 4 (four) times daily as needed.     ibuprofen  (ADVIL,MOTRIN) 600 MG tablet Take 600 mg by mouth every 6 (six) hours as needed.   0   Ipratropium-Albuterol (COMBIVENT) 20-100 MCG/ACT AERS respimat INHALE 1 PUFF BY MOUTH FOUR TIMES A DAY AS NEEDED COPD     latanoprost (XALATAN) 0.005 % ophthalmic solution INSTILL 1 DROP INTO AFFECTED EYE(S) BY OPHTHALMIC ROUTE ONCE DAILY INTHE EVENING     Latanoprostene Bunod (VYZULTA) 0.024 % SOLN Apply to eye.     Leuprolide Acetate, 4 Month, (ELIGARD) 30 MG injection 30 MG SUBCUTANEOUSLY Q90D PRN     losartan (COZAAR) 100 MG tablet Take 1 tablet by mouth daily.     magnesium oxide (MAG-OX) 400 MG tablet Take 1 tablet (400 mg total) by mouth daily. (Patient taking differently: Take 800 mg by mouth daily.) 90 tablet 2   metFORMIN (GLUCOPHAGE) 500 MG tablet Take 2 tablets (1,000 mg total) by mouth 2 (two) times daily with a meal. 120 tablet 3   montelukast (SINGULAIR) 10 MG tablet Take 10 mg by mouth at bedtime.     potassium chloride (MICRO-K) 10 MEQ CR capsule TAKE 4 CAPSULES BY MOUTH WITH FOOD. OPEN AND SPRINKLE ON FOOD 120 capsule 3   predniSONE (DELTASONE) 5 MG tablet Take 1 tablet (5 mg total) by mouth daily with breakfast. 30 tablet 3   prochlorperazine (COMPAZINE) 10 MG tablet Take 1 tablet (10 mg total) by mouth every 6 (six) hours as needed for nausea or vomiting. 60 tablet 3   Semaglutide (RYBELSUS) 7 MG TABS Take 1 tablet by mouth daily.     sucralfate (CARAFATE) 1 g tablet Take 1 tablet by mouth 4 (four) times daily as needed.     TRELEGY ELLIPTA 100-62.5-25 MCG/INH AEPB INHALE 1 PUFF ONCE DAILY     vitamin C (ASCORBIC ACID) 500 MG tablet Take 500 mg by mouth daily.     No current facility-administered medications for this visit.   Facility-Administered Medications Ordered in Other  Visits  Medication Dose Route Frequency Provider Last Rate Last Admin   denosumab (XGEVA) 120 MG/1.7ML injection            diphenhydrAMINE (BENADRYL) 50 MG/ML injection            famotidine (PEPCID) 20-0.9  MG/50ML-% IVPB            heparin lock flush 100 unit/mL  500 Units Intracatheter Once PRN Derek Jack, MD       iron sucrose (VENOFER) 300 mg in sodium chloride 0.9 % 250 mL IVPB  300 mg Intravenous Once Derek Jack, MD 176.7 mL/hr at 05/20/22 1500 300 mg at 05/20/22 1500   pegfilgrastim (NEULASTA ONPRO KIT) injection 6 mg  6 mg Subcutaneous Once Derek Jack, MD       sodium chloride flush (NS) 0.9 % injection 10 mL  10 mL Intracatheter PRN Derek Jack, MD        ALLERGIES:  Allergies  Allergen Reactions   Lisinopril Cough   Metoprolol Other (See Comments) and Cough    Increases frequency of cough Other reaction(s): Other (See Comments) Increases frequency of cough    PHYSICAL EXAM:  Performance status (ECOG): 1 - Symptomatic but completely ambulatory  There were no vitals filed for this visit.  Wt Readings from Last 3 Encounters:  05/20/22 244 lb 0.8 oz (110.7 kg)  04/27/22 242 lb 11.6 oz (110.1 kg)  03/31/22 242 lb 6.4 oz (110 kg)   Physical Exam Vitals reviewed.  Constitutional:      Appearance: Normal appearance. He is obese.  Cardiovascular:     Rate and Rhythm: Normal rate and regular rhythm.     Pulses: Normal pulses.     Heart sounds: Normal heart sounds.  Pulmonary:     Effort: Pulmonary effort is normal.     Breath sounds: Normal breath sounds.  Neurological:     General: No focal deficit present.     Mental Status: He is alert and oriented to person, place, and time.  Psychiatric:        Mood and Affect: Mood normal.        Behavior: Behavior normal.     LABORATORY DATA:  I have reviewed the labs as listed.     Latest Ref Rng & Units 05/20/2022   10:38 AM 04/27/2022    8:01 AM 03/31/2022    9:24 AM  CBC  WBC 4.0 - 10.5 K/uL 10.2  9.3  15.2   Hemoglobin 13.0 - 17.0 g/dL 10.4  11.0  10.8   Hematocrit 39.0 - 52.0 % 33.0  34.9  34.3   Platelets 150 - 400 K/uL 403  382  371       Latest Ref Rng & Units 05/20/2022    10:38 AM 04/27/2022    8:01 AM 03/31/2022    9:24 AM  CMP  Glucose 70 - 99 mg/dL 135  152  134   BUN 8 - 23 mg/dL _0 Creatinine 0.61 - 1.24 mg/dL 0.98  1.01  1.01   Sodium 135 - 145 mmol/L 141  140  141   Potassium 3.5 - 5.1 mmol/L 3.9  3.5  3.8   Chloride 98 - 111 mmol/L 105  103  103   CO2 22 - 32 mmol/L _1 Calcium 8.9 - 10.3 mg/dL 8.9  8.8  8.7   Total Protein 6.5 - 8.1 g/dL 6.1  6.0  6.1   Total  Bilirubin 0.3 - 1.2 mg/dL 0.5  0.8  1.3   Alkaline Phos 38 - 126 U/L 49  44  64   AST 15 - 41 U/L _0 ALT 0 - 44 U/L _1 DIAGNOSTIC IMAGING:  I have independently reviewed the scans and discussed with the patient. No results found.   ASSESSMENT:  1.  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 -Abiraterone and prednisone from 07/13/2016 through 04/09/2019, discontinued secondary to PSA progression. -Enzalutamide from 04/12/2019 through 11/03/2019 with progression. -Last Lupron on 06/09/2019. -CT CAP on 11/10/2019 shows no findings of active malignancy.  Stable small sclerotic lesions at T4 and T10, nonspecific.  Right hydronephrosis with right proximal hydroureter extending down to a 0.8 x 0.8 x 0.4 cm proximal ureteral stone at L3 vertical level.  Nonobstructive right and possibly left nephrolithiasis. -Bone scan on 11/10/2019 shows right proximal tibial metaphyseal activity from recent trauma.  Degenerative findings in the spine, right sternoclavicular joint, shoulders and right foot.  No evidence of metastatic disease. -Foundation 1 test shows MS-stable.  AR amplification.  Sensitivity is reduced due to sample quality. -Guardant 360 on 12/12/2019 MSI high not detected.  TMB was not evaluable.  No other targetable mutations. -F-18-PYLARIFY PET scan showed intense radiotracer activity in the left supraclavicular and  prevascular lymph nodes, measuring 10 mm.  Cluster of small prevascular lymph nodes measures 5-7 mm each with SUV 54.  AP window lymph node measuring 8 mm.  No activity in the prostate gland or abdominal or pelvic lymph nodes.  No skeletal activity. - SBRT to the left supraclavicular lymph node and prevascular mediastinal lymph node from 08/27/2020 through 09/06/2020, 40 Gray in 5 fractions. - CT CAP (10/22/2021): [facility] at [facility]: No lymphadenopathy.  Cirrhosis.  Intrahepatic and extrahepatic biliary ductal dilatation. - Bone scan (10/28/2021): New focal abnormal tracer uptake at the inferior aspect of the right scapula. - PSMA PET scan (11/13/2021): Interval development of multifocal tracer avid bone metastasis, only 1 of these lesions is visible on the recent nuclear medicine bone scan dated 10/28/2021.  New tracer avid right axillary, subcarinal, left paratracheal, left retrocrural and upper abdominal lymph nodes. - Cycle 1 of cabazitaxel on 12/15/2021, cycle 2 on 01/06/2022   2.  Androgen deprivation therapy induced bone loss: -Received Zometa every 3 months for a year completed on 10/29/2017.   3.  Genetic testing: -Germline mutation testing was negative.    PLAN:  1.  Metastatic prostate cancer to the left supraclavicular and mediastinal lymph nodes: - He has tolerated cycle 6 reasonably well.  He had slightly more tiredness lasting 2 to 3 days after last treatment.  Denies any GI side effects. - Reviewed labs today which showed normal LFTs.  CBC was grossly normal with hemoglobin 10.4.  Ferritin is 133 and percent saturation 12. - Last PSA improved to 8.18. - Proceed with next cycle today with cabazitaxel 20 mg/m. - We will give him 1 dose of Venofer 300 mg IV x 1. - RTC 3 weeks for follow-up with repeat labs and PSA and treatment.   2.  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  Continue denosumab every 6 weeks.   3.  Leg swelling: - Continue Bumex 1  and half tablets daily.  Mild swelling is stable.  4.  Peripheral neuropathy: - Feet feel cold in the bottom and occasionally numb.  No worsening since start of cabazitaxel.  5.  Hypomagnesemia: - Continue magnesium twice daily.  Magnesium is normal today.   Orders placed this encounter:  Orders Placed This Encounter  Procedures   CBC with Differential   Comprehensive metabolic panel   CBC with Differential   Comprehensive metabolic panel   Iron and TIBC ([facility] ONLY)   Ferritin       Derek Jack, MD [place] [phone]

## 2022-05-20 NOTE — Progress Notes (Signed)
Patients port flushed without difficulty.  Good blood return noted with no bruising or swelling noted at site.  Patient remains accessed for chemotherapy treatment.  

## 2022-05-20 NOTE — Patient Instructions (Signed)
[place] at [facility] Discharge Instructions   You were seen and examined today by Dr. Delton Coombes.  He reviewed the results of your lab work which are normal/stable. Your PSA level is pending.   We will plan to give you a dose of IV iron today. Your hemoglobin is slightly low at 10.4. We will check your iron levels today.  We will proceed with your treatment today.   Return as scheduled.    Thank you for choosing [place] at [facility] to provide your oncology and hematology care.  To afford each patient quality time with our provider, please arrive at least 15 minutes before your scheduled appointment time.   If you have a lab appointment with the [facility] please come in thru the Main Entrance and check in at the main information desk.  You need to re-schedule your appointment should you arrive 10 or more minutes late.  We strive to give you quality time with our providers, and arriving late affects you and other patients whose appointments are after yours.  Also, if you no show three or more times for appointments you may be dismissed from the clinic at the providers discretion.     Again, thank you for choosing [facility].  Our hope is that these requests will decrease the amount of time that you wait before being seen by our physicians.       _____________________________________________________________  Should you have questions after your visit to [facility], please contact our office at [phone] and follow the prompts.  Our office hours are 8:00 a.m. and 4:30 p.m. Monday - Friday.  Please note that voicemails left after 4:00 p.m. may not be returned until the following business day.  We are closed weekends and major holidays.  You do have access to a nurse 24-7, just call the main number to the clinic [phone] and do not press any options, hold on the line and a nurse will answer the phone.     For prescription refill requests, have your pharmacy contact our office and allow 72 hours.    Due to Covid, you will need to wear a mask upon entering the hospital. If you do not have a mask, a mask will be given to you at the Main Entrance upon arrival. For doctor visits, patients may have 1 support person age 74 or older with them. For treatment visits, patients can not have anyone with them due to social distancing guidelines and our immunocompromised population.

## 2022-05-21 ENCOUNTER — Other Ambulatory Visit: Payer: Self-pay

## 2022-05-22 ENCOUNTER — Encounter (HOSPITAL_COMMUNITY): Payer: Self-pay | Admitting: Hematology

## 2022-05-22 ENCOUNTER — Encounter: Payer: Self-pay | Admitting: Hematology

## 2022-05-22 NOTE — Progress Notes (Signed)
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 Docetaxel x 14 cycles from 08/11/2015 through 06/12/2016. 2. Abiraterone from 07/13/2016 through 04/09/2019. 3. Enzalutamide from 04/12/2019 to 11/03/2019.  NGS Results: Foundation 1 MS--stable, TMB 4 Muts/Mb  CURRENT THERAPY: Cabazitaxel + Prednisone q21d  BRIEF ONCOLOGIC HISTORY:  Oncology History  Prostate cancer metastatic to intrathoracic lymph node ([place])  07/24/2015 Initial Diagnosis   Prostate cancer metastatic to the left supraclavicular and anterior mediastinal lymph nodes   08/21/2015 - 06/12/2016 Chemotherapy   Docetaxel every 3 weeks for 14 cycles, discontinued as PSA has plateaued around 11    07/13/2016 Treatment Plan Change   Zytiga 1000 milligrams daily along with prednisone 5 mg daily, started secondary to fluid overload from docetaxel   10/14/2018 Genetic Testing   Negative genetic testing.  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  The Common Hereditary Gene Panel offered by [facility] includes sequencing and/or deletion duplication testing of the following 48 genes: APC, ATM, AXIN2, BARD1, BMPR1A, BRCA1, BRCA2, BRIP1, CDH1, CDK4, CDKN2A (p14ARF), CDKN2A (p16INK4a), CHEK2, CTNNA1, DICER1, EPCAM (Deletion/duplication testing only), GREM1 (promoter region deletion/duplication testing only), KIT, MEN1, MLH1, MSH2, MSH3, MSH6, MUTYH, NBN, NF1, NHTL1, PALB2, PDGFRA, PMS2, POLD1, POLE, PTEN, RAD50, RAD51C, RAD51D, RNF43, SDHB, SDHC, SDHD, SMAD4, SMARCA4. STK11, TP53, TSC1, TSC2, and VHL.  The following genes were evaluated for sequence changes only: SDHA and HOXB13 c.251G>A variant only. The report date is Oct 14, 2018.   09/18/2019 Genetic Testing   Foundation One  CDx      12/12/2019 Genetic Testing   Guardant 360       12/15/2021 - 01/06/2022 Chemotherapy   Patient is on Treatment Plan : PROSTATE Cabazitaxel + Prednisone q21d     12/15/2021 -  Chemotherapy   Patient is on Treatment Plan : PROSTATE Cabazitaxel (20) D1 + Prednisone D1-21 q21d       CANCER STAGING:  Cancer Staging  No matching staging information was found for the patient.  INTERVAL HISTORY:  [name], a 81 y.o. male, seen for follow-up and toxicity assessment prior to cycle 7 of chemotherapy.  Reports energy level 70%.  He had an upper respiratory infection and was treated with Augmentin.  Now on doxycycline which was started on 04/20/2022.   REVIEW OF SYSTEMS:  Review of Systems  Respiratory:  Positive for cough and shortness of breath.   Gastrointestinal:  Positive for diarrhea.  Neurological:  Positive for numbness.  All other systems reviewed and are negative.   PAST MEDICAL/SURGICAL HISTORY:  Past Medical History:  Diagnosis Date   COPD (chronic obstructive pulmonary disease) ([place])    Diabetes mellitus without complication ([facility])    Family history of ovarian cancer    Family history of prostate cancer    Hypertension    Prostate cancer ([place])    Past Surgical History:  Procedure Laterality Date   GOLD SEED IMPLANT      SOCIAL HISTORY:  Social History   Socioeconomic History   Marital status: Widowed    Spouse name: Not on file   Number of children: Not on file   Years of education: Not on file   Highest education level: Not on file  Occupational History   Not on file  Tobacco  Use   Smoking status: Former    Packs/day: 0.25    Years: 60.00    Total pack years: 15.00    Types: Cigarettes    Quit date: 06/25/2014    Years since quitting: 7.9   Smokeless tobacco: Never   Tobacco comments:    smoked since young age, quit in 2016  Substance and Sexual Activity   Alcohol use: Not Currently   Drug use: No   Sexual activity: Not Currently   Other Topics Concern   Not on file  Social History Narrative   Not on file   Social Determinants of Health   Financial Resource Strain: Low Risk  (05/21/2020)   Overall Financial Resource Strain (CARDIA)    Difficulty of Paying Living Expenses: Not hard at all  Food Insecurity: No Food Insecurity (05/21/2020)   Hunger Vital Sign    Worried About Running Out of Food in the Last Year: Never true    Ran Out of Food in the Last Year: Never true  Transportation Needs: No Transportation Needs (05/21/2020)   PRAPARE - Hydrologist (Medical): No    Lack of Transportation (Non-Medical): No  Physical Activity: Insufficiently Active (05/21/2020)   Exercise Vital Sign    Days of Exercise per Week: 2 days    Minutes of Exercise per Session: 20 min  Stress: No Stress Concern Present (05/21/2020)   [place]    Feeling of Stress : Not at all  Social Connections: Moderately Isolated (05/21/2020)   Social Connection and Isolation Panel [NHANES]    Frequency of Communication with Friends and Family: More than three times a week    Frequency of Social Gatherings with Friends and Family: More than three times a week    Attends Religious Services: 1 to 4 times per year    Active Member of [employer] or Organizations: No    Attends Archivist Meetings: Never    Marital Status: Widowed  Intimate Partner Violence: Not At Risk (05/21/2020)   Humiliation, Afraid, Rape, and Kick questionnaire    Fear of Current or Ex-Partner: No    Emotionally Abused: No    Physically Abused: No    Sexually Abused: No    FAMILY HISTORY:  Family History  Problem Relation Age of Onset   Ovarian cancer Mother 82   Heart attack Father    Prostate cancer Maternal Uncle 77   Cancer Cousin        pat cousin with unknown cancer   Colon cancer Neg Hx    Pancreatic cancer Neg Hx    Breast cancer Neg Hx     CURRENT  MEDICATIONS:  Current Outpatient Medications  Medication Sig Dispense Refill   ACCU-CHEK GUIDE test strip      Accu-Chek Softclix Lancets lancets      acetaminophen (TYLENOL) 500 MG tablet Take 500 mg by mouth every 6 (six) hours as needed for moderate pain.     albuterol (ACCUNEB) 0.63 MG/3ML nebulizer solution Take 1 ampule by nebulization every 6 (six) hours as needed for wheezing.     albuterol (PROVENTIL) (2.5 MG/3ML) 0.083% nebulizer solution INHALE 3 ML IN NEBULIZER BY MOUTH FOUR TIMES A DAY AS NEEDED     amLODipine (NORVASC) 2.5 MG tablet Take 1 tablet (2.5 mg total) by mouth daily as needed. 30 tablet 0   aspirin 81 MG EC tablet Take 1 tablet by mouth daily.  atorvastatin (LIPITOR) 10 MG tablet TAKE 1 TABLET BY MOUTH EVERY DAY (Patient taking differently: Take 5 mg by mouth daily.) 90 tablet 3   azelastine (ASTELIN) 0.1 % nasal spray USE 2 SPRAYS IN EACH NOSTRIL TWICE A DAY FOR ALLERGIC RHINITIS     benzonatate (TESSALON) 200 MG capsule Take by mouth.     Blood Glucose Monitoring Suppl (ACCU-CHEK GUIDE) w/Device KIT      bumetanide (BUMEX) 1 MG tablet TAKE 1 AND 1/2 TABLETS(1.5 MG) BY MOUTH DAILY 45 tablet 6   CALCIUM PO Take 600 mg by mouth daily.      cetirizine (ZYRTEC) 10 MG tablet Take 1 tablet by mouth daily.     CHERATUSSIN AC 100-10 MG/5ML syrup Take 5 mLs by mouth daily as needed.   0   cyclobenzaprine (FLEXERIL) 10 MG tablet Take 10 mg by mouth as needed.   0   dicyclomine (BENTYL) 20 MG tablet Take 1 tablet by mouth 3 (three) times daily as needed.     dorzolamide (TRUSOPT) 2 % ophthalmic solution Place 1 drop into both eyes 3 (three) times daily.     ferrous sulfate 325 (65 FE) MG tablet Take 1 tablet by mouth daily.     fluticasone (FLONASE) 50 MCG/ACT nasal spray USE 2 SPRAY(S) IN EACH NOSTRIL ONCE DAILY FOR 30 DAYS 15.8 mL 12   guaifenesin (HUMIBID E) 400 MG TABS tablet Take 1 tablet by mouth 4 (four) times daily as needed.     ibuprofen (ADVIL,MOTRIN) 600 MG tablet  Take 600 mg by mouth every 6 (six) hours as needed.   0   Ipratropium-Albuterol (COMBIVENT) 20-100 MCG/ACT AERS respimat INHALE 1 PUFF BY MOUTH FOUR TIMES A DAY AS NEEDED COPD     latanoprost (XALATAN) 0.005 % ophthalmic solution INSTILL 1 DROP INTO AFFECTED EYE(S) BY OPHTHALMIC ROUTE ONCE DAILY INTHE EVENING     Latanoprostene Bunod (VYZULTA) 0.024 % SOLN Apply to eye.     Leuprolide Acetate, 4 Month, (ELIGARD) 30 MG injection 30 MG SUBCUTANEOUSLY Q90D PRN     losartan (COZAAR) 100 MG tablet Take 1 tablet by mouth daily.     magnesium oxide (MAG-OX) 400 MG tablet Take 1 tablet (400 mg total) by mouth daily. (Patient taking differently: Take 800 mg by mouth daily.) 90 tablet 2   metFORMIN (GLUCOPHAGE) 500 MG tablet Take 2 tablets (1,000 mg total) by mouth 2 (two) times daily with a meal. 120 tablet 3   montelukast (SINGULAIR) 10 MG tablet Take 10 mg by mouth at bedtime.     potassium chloride (MICRO-K) 10 MEQ CR capsule TAKE 4 CAPSULES BY MOUTH WITH FOOD. OPEN AND SPRINKLE ON FOOD 120 capsule 3   prochlorperazine (COMPAZINE) 10 MG tablet Take 1 tablet (10 mg total) by mouth every 6 (six) hours as needed for nausea or vomiting. 60 tablet 3   sucralfate (CARAFATE) 1 g tablet Take 1 tablet by mouth 4 (four) times daily as needed.     TRELEGY ELLIPTA 100-62.5-25 MCG/INH AEPB INHALE 1 PUFF ONCE DAILY     vitamin C (ASCORBIC ACID) 500 MG tablet Take 500 mg by mouth daily.     Cholecalciferol (VITAMIN D3) 50 MCG (2000 UT) capsule Take 2,000 Units by mouth daily.     predniSONE (DELTASONE) 5 MG tablet Take 1 tablet (5 mg total) by mouth daily with breakfast. 30 tablet 3   Semaglutide (RYBELSUS) 7 MG TABS Take 1 tablet by mouth daily.     No current facility-administered medications for  this visit.   Facility-Administered Medications Ordered in Other Visits  Medication Dose Route Frequency Provider Last Rate Last Admin   denosumab (XGEVA) 120 MG/1.7ML injection            diphenhydrAMINE (BENADRYL) 50  MG/ML injection            famotidine (PEPCID) 20-0.9 MG/50ML-% IVPB             ALLERGIES:  Allergies  Allergen Reactions   Lisinopril Cough   Metoprolol Other (See Comments) and Cough    Increases frequency of cough Other reaction(s): Other (See Comments) Increases frequency of cough    PHYSICAL EXAM:  Performance status (ECOG): 1 - Symptomatic but completely ambulatory  There were no vitals filed for this visit.  Wt Readings from Last 3 Encounters:  05/20/22 244 lb 0.8 oz (110.7 kg)  04/27/22 242 lb 11.6 oz (110.1 kg)  03/31/22 242 lb 6.4 oz (110 kg)   Physical Exam Vitals reviewed.  Constitutional:      Appearance: Normal appearance. He is obese.  Cardiovascular:     Rate and Rhythm: Normal rate and regular rhythm.     Pulses: Normal pulses.     Heart sounds: Normal heart sounds.  Pulmonary:     Effort: Pulmonary effort is normal.     Breath sounds: Normal breath sounds.  Neurological:     General: No focal deficit present.     Mental Status: He is alert and oriented to person, place, and time.  Psychiatric:        Mood and Affect: Mood normal.        Behavior: Behavior normal.     LABORATORY DATA:  I have reviewed the labs as listed.     Latest Ref Rng & Units 05/20/2022   10:38 AM 04/27/2022    8:01 AM 03/31/2022    9:24 AM  CBC  WBC 4.0 - 10.5 K/uL 10.2  9.3  15.2   Hemoglobin 13.0 - 17.0 g/dL 10.4  11.0  10.8   Hematocrit 39.0 - 52.0 % 33.0  34.9  34.3   Platelets 150 - 400 K/uL 403  382  371       Latest Ref Rng & Units 05/20/2022   10:38 AM 04/27/2022    8:01 AM 03/31/2022    9:24 AM  CMP  Glucose 70 - 99 mg/dL 135  152  134   BUN 8 - 23 mg/dL _0 Creatinine 0.61 - 1.24 mg/dL 0.98  1.01  1.01   Sodium 135 - 145 mmol/L 141  140  141   Potassium 3.5 - 5.1 mmol/L 3.9  3.5  3.8   Chloride 98 - 111 mmol/L 105  103  103   CO2 22 - 32 mmol/L _1 Calcium 8.9 - 10.3 mg/dL 8.9  8.8  8.7   Total Protein 6.5 - 8.1 g/dL 6.1  6.0  6.1    Total Bilirubin 0.3 - 1.2 mg/dL 0.5  0.8  1.3   Alkaline Phos 38 - 126 U/L 49  44  64   AST 15 - 41 U/L _2 ALT 0 - 44 U/L _3 DIAGNOSTIC IMAGING:  I have independently reviewed the scans and discussed with the patient. No results found.   ASSESSMENT:  1.  Metastatic prostate cancer to the left supraclavicular and mediastinal lymph nodes: -Docetaxel for 14  cycles discontinued as his PSA plateaued around 11 from 08/11/2015 through 06/12/2016. -Abiraterone and prednisone from 07/13/2016 through 04/09/2019, discontinued secondary to PSA progression. -Enzalutamide from 04/12/2019 through 11/03/2019 with progression. -Last Lupron on 06/09/2019. -CT CAP on 11/10/2019 shows no findings of active malignancy.  Stable small sclerotic lesions at T4 and T10, nonspecific.  Right hydronephrosis with right proximal hydroureter extending down to a 0.8 x 0.8 x 0.4 cm proximal ureteral stone at L3 vertical level.  Nonobstructive right and possibly left nephrolithiasis. -Bone scan on 11/10/2019 shows right proximal tibial metaphyseal activity from recent trauma.  Degenerative findings in the spine, right sternoclavicular joint, shoulders and right foot.  No evidence of metastatic disease. -Foundation 1 test shows MS-stable.  AR amplification.  Sensitivity is reduced due to sample quality. -Guardant 360 on 12/12/2019 MSI high not detected.  TMB was not evaluable.  No other targetable mutations. -F-18-PYLARIFY PET scan showed intense radiotracer activity in the left supraclavicular and prevascular lymph nodes, measuring 10 mm.  Cluster of small prevascular lymph nodes measures 5-7 mm each with SUV 54.  AP window lymph node measuring 8 mm.  No activity in the prostate gland or abdominal or pelvic lymph nodes.  No skeletal activity. - SBRT to the left supraclavicular lymph node and prevascular mediastinal lymph node from 08/27/2020 through 09/06/2020, 40 Gray in 5 fractions. - CT CAP (10/22/2021): Done at  [facility]: No lymphadenopathy.  Cirrhosis.  Intrahepatic and extrahepatic biliary ductal dilatation. - Bone scan (10/28/2021): New focal abnormal tracer uptake at the inferior aspect of the right scapula. - PSMA PET scan (11/13/2021): Interval development of multifocal tracer avid bone metastasis, only 1 of these lesions is visible on the recent nuclear medicine bone scan dated 10/28/2021.  New tracer avid right axillary, subcarinal, left paratracheal, left retrocrural and upper abdominal lymph nodes. - Cycle 1 of cabazitaxel on 12/15/2021, cycle 2 on 01/06/2022   2.  Androgen deprivation therapy induced bone loss: -Received Zometa every 3 months for a year completed on 10/29/2017.   3.  Genetic testing: -Germline mutation testing was negative.    PLAN:  1.  Metastatic prostate cancer to the left supraclavicular and mediastinal lymph nodes: - He has tolerated cycle 6 cabazitaxel well. - His PSA is 8.18. - He had upper respiratory infection treated with Augmentin and is now completing doxycycline. - Labs today shows white count and platelet count normal.  LFTs are normal. - Proceed with cycle 7 today.  RTC 3 weeks for follow-up.   2.  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  Albumin 3.7.  Continue denosumab every 6 weeks.   3.  Leg swelling: - Continue Bumex 1 and half tablet daily.  Mild swelling stable.   4.  Peripheral neuropathy: - Feet feel cold in the bottom and occasionally numb.  No worsening since start of cabazitaxel.  5.  Hypomagnesemia: - Continue magnesium once daily.  Magnesium normal today.   Orders placed this encounter:  No orders of the defined types were placed in this encounter.     Derek Jack, MD [facility] [phone]

## 2022-05-25 ENCOUNTER — Other Ambulatory Visit: Payer: Self-pay

## 2022-06-03 ENCOUNTER — Other Ambulatory Visit: Payer: Self-pay

## 2022-06-06 ENCOUNTER — Other Ambulatory Visit: Payer: Self-pay | Admitting: Hematology

## 2022-06-11 ENCOUNTER — Inpatient Hospital Stay: Payer: Medicare PPO | Attending: Hematology

## 2022-06-11 ENCOUNTER — Inpatient Hospital Stay: Payer: Medicare PPO | Admitting: Hematology

## 2022-06-11 ENCOUNTER — Inpatient Hospital Stay: Payer: Medicare PPO

## 2022-06-11 ENCOUNTER — Encounter: Payer: Self-pay | Admitting: Hematology

## 2022-06-11 VITALS — BP 123/84 | HR 82 | Temp 97.0°F | Resp 18

## 2022-06-11 VITALS — BP 149/82 | Wt 241.2 lb

## 2022-06-11 DIAGNOSIS — C61 Malignant neoplasm of prostate: Secondary | ICD-10-CM | POA: Insufficient documentation

## 2022-06-11 DIAGNOSIS — M7989 Other specified soft tissue disorders: Secondary | ICD-10-CM | POA: Diagnosis not present

## 2022-06-11 DIAGNOSIS — D649 Anemia, unspecified: Secondary | ICD-10-CM | POA: Insufficient documentation

## 2022-06-11 DIAGNOSIS — Z79818 Long term (current) use of other agents affecting estrogen receptors and estrogen levels: Secondary | ICD-10-CM | POA: Insufficient documentation

## 2022-06-11 DIAGNOSIS — Z5111 Encounter for antineoplastic chemotherapy: Secondary | ICD-10-CM | POA: Insufficient documentation

## 2022-06-11 DIAGNOSIS — Z79899 Other long term (current) drug therapy: Secondary | ICD-10-CM | POA: Insufficient documentation

## 2022-06-11 DIAGNOSIS — C771 Secondary and unspecified malignant neoplasm of intrathoracic lymph nodes: Secondary | ICD-10-CM | POA: Diagnosis not present

## 2022-06-11 DIAGNOSIS — C7951 Secondary malignant neoplasm of bone: Secondary | ICD-10-CM | POA: Insufficient documentation

## 2022-06-11 DIAGNOSIS — Z5189 Encounter for other specified aftercare: Secondary | ICD-10-CM | POA: Insufficient documentation

## 2022-06-11 DIAGNOSIS — G629 Polyneuropathy, unspecified: Secondary | ICD-10-CM | POA: Insufficient documentation

## 2022-06-11 LAB — COMPREHENSIVE METABOLIC PANEL
ALT: 16 U/L (ref 0–44)
AST: 21 U/L (ref 15–41)
Albumin: 3.9 g/dL (ref 3.5–5.0)
Alkaline Phosphatase: 63 U/L (ref 38–126)
Anion gap: 10 (ref 5–15)
BUN: 17 mg/dL (ref 8–23)
CO2: 28 mmol/L (ref 22–32)
Calcium: 9 mg/dL (ref 8.9–10.3)
Chloride: 103 mmol/L (ref 98–111)
Creatinine, Ser: 0.94 mg/dL (ref 0.61–1.24)
GFR, Estimated: >60 mL/min (ref >60)
Glucose, Bld: 152 mg/dL — ABNORMAL HIGH (ref 70–99)
Potassium: 3.9 mmol/L (ref 3.5–5.1)
Sodium: 141 mmol/L (ref 135–145)
Total Bilirubin: 0.9 mg/dL (ref 0.3–1.2)
Total Protein: 6.6 g/dL (ref 6.5–8.1)

## 2022-06-11 LAB — CBC WITH DIFFERENTIAL/PLATELET
Abs Immature Granulocytes: 0.08 10*3/uL — ABNORMAL HIGH (ref 0.00–0.07)
Basophils Absolute: 0.1 10*3/uL (ref 0.0–0.1)
Basophils Relative: 1 %
Eosinophils Absolute: 0.1 10*3/uL (ref 0.0–0.5)
Eosinophils Relative: 0 %
HCT: 35.5 % — ABNORMAL LOW (ref 39.0–52.0)
Hemoglobin: 11.2 g/dL — ABNORMAL LOW (ref 13.0–17.0)
Immature Granulocytes: 1 %
Lymphocytes Relative: 7 %
Lymphs Abs: 1 10*3/uL (ref 0.7–4.0)
MCH: 29.9 pg (ref 26.0–34.0)
MCHC: 31.5 g/dL (ref 30.0–36.0)
MCV: 94.9 fL (ref 80.0–100.0)
Monocytes Absolute: 1.4 10*3/uL — ABNORMAL HIGH (ref 0.1–1.0)
Monocytes Relative: 10 %
Neutro Abs: 11.4 10*3/uL — ABNORMAL HIGH (ref 1.7–7.7)
Neutrophils Relative %: 81 %
Platelets: 344 10*3/uL (ref 150–400)
RBC: 3.74 MIL/uL — ABNORMAL LOW (ref 4.22–5.81)
RDW: 17.2 % — ABNORMAL HIGH (ref 11.5–15.5)
WBC: 13.9 10*3/uL — ABNORMAL HIGH (ref 4.0–10.5)
nRBC: 0 % (ref 0.0–0.2)

## 2022-06-11 LAB — PSA: Prostatic Specific Antigen: 8.13 ng/mL — ABNORMAL HIGH (ref 0.00–4.00)

## 2022-06-11 LAB — MAGNESIUM: Magnesium: 1.9 mg/dL (ref 1.7–2.4)

## 2022-06-11 MED ORDER — SODIUM CHLORIDE 0.9 % IV SOLN: Freq: Once | INTRAVENOUS | Status: AC

## 2022-06-11 MED ORDER — FAMOTIDINE IN NACL 20-0.9 MG/50ML-% IV SOLN
20.0000 mg | Freq: Once | INTRAVENOUS | Status: AC
  Administered 2022-06-11: 20 mg via INTRAVENOUS
  Filled 2022-06-11: qty 50

## 2022-06-11 MED ORDER — DIPHENHYDRAMINE HCL 50 MG/ML IJ SOLN: 25.0000 mg | Freq: Once | INTRAMUSCULAR | Status: DC

## 2022-06-11 MED ORDER — SODIUM CHLORIDE 0.9 % IV SOLN
10.0000 mg | Freq: Once | INTRAVENOUS | Status: AC
  Administered 2022-06-11: 10 mg via INTRAVENOUS
  Filled 2022-06-11: qty 10

## 2022-06-11 MED ORDER — ONDANSETRON HCL 4 MG/2ML IJ SOLN
8.0000 mg | Freq: Once | INTRAMUSCULAR | Status: AC
  Administered 2022-06-11: 8 mg via INTRAVENOUS
  Filled 2022-06-11: qty 4

## 2022-06-11 MED ORDER — CETIRIZINE HCL 10 MG/ML IV SOLN
10.0000 mg | Freq: Once | INTRAVENOUS | Status: AC
  Administered 2022-06-11: 10 mg via INTRAVENOUS
  Filled 2022-06-11: qty 1

## 2022-06-11 MED ORDER — SODIUM CHLORIDE 0.9% FLUSH
10.0000 mL | INTRAVENOUS | Status: DC | PRN
  Administered 2022-06-11: 10 mL via INTRAVENOUS

## 2022-06-11 MED ORDER — SODIUM CHLORIDE 0.9 % IV SOLN
300.0000 mg | Freq: Once | INTRAVENOUS | Status: AC
  Administered 2022-06-11: 300 mg via INTRAVENOUS
  Filled 2022-06-11: qty 300

## 2022-06-11 MED ORDER — SODIUM CHLORIDE 0.9 % IV SOLN
20.0000 mg/m2 | Freq: Once | INTRAVENOUS | Status: AC
  Administered 2022-06-11: 45 mg via INTRAVENOUS
  Filled 2022-06-11: qty 4.5

## 2022-06-11 MED ORDER — PEGFILGRASTIM 6 MG/0.6ML ~~LOC~~ PSKT
6.0000 mg | PREFILLED_SYRINGE | Freq: Once | SUBCUTANEOUS | Status: AC
  Administered 2022-06-11: 6 mg via SUBCUTANEOUS
  Filled 2022-06-11: qty 0.6

## 2022-06-11 MED ORDER — HEPARIN SOD (PORK) LOCK FLUSH 100 UNIT/ML IV SOLN
500.0000 [IU] | Freq: Once | INTRAVENOUS | Status: AC | PRN
  Administered 2022-06-11: 500 [IU]

## 2022-06-11 MED ORDER — SODIUM CHLORIDE 0.9% FLUSH
10.0000 mL | INTRAVENOUS | Status: DC | PRN
  Administered 2022-06-11: 10 mL

## 2022-06-11 NOTE — Patient Instructions (Addendum)
[facility]  Discharge Instructions  You were seen and examined today by Dr. Delton Coombes.  Your last PSA as improved from 8.18 to 5.8. This is great news. You will receive one more iron infusion.  Proceed today with treatment as planned.  Decrease the Magnesium you are taking to once daily.  Follow-up as scheduled.  Thank you for choosing [facility] to provide your oncology and hematology care.   To afford each patient quality time with our provider, please arrive at least 15 minutes before your scheduled appointment time. You may need to reschedule your appointment if you arrive late (10 or more minutes). Arriving late affects you and other patients whose appointments are after yours.  Also, if you miss three or more appointments without notifying the office, you may be dismissed from the clinic at the provider's discretion.    Again, thank you for choosing [facility].  Our hope is that these requests will decrease the amount of time that you wait before being seen by our physicians.   If you have a lab appointment with the [facility] please come in thru the Main Entrance and check in at the main information desk.           _____________________________________________________________  Should you have questions after your visit to [facility], please contact our office at [phone] and follow the prompts.  Our office hours are 8:00 a.m. to 4:30 p.m. Monday - Thursday and 8:00 a.m. to 2:30 p.m. Friday.  Please note that voicemails left after 4:00 p.m. may not be returned until the following business day.  We are closed weekends and all major holidays.  You do have access to a nurse 24-7, just call the main number to the clinic [phone] and do not press any options, hold on the line and a nurse will answer the phone.    For prescription refill requests, have your pharmacy contact our office and  allow 72 hours.    Masks are optional in the cancer centers. If you would like for your care team to wear a mask while they are taking care of you, please let them know. You may have one support person who is at least 84 years old accompany you for your appointments.

## 2022-06-11 NOTE — Progress Notes (Signed)
[facility] [place], [place] [address]   CLINIC:  Medical Oncology/Hematology  PCP:  [facility], [facility] [address] 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 Docetaxel x 14 cycles from 08/11/2015 through 06/12/2016. 2. Abiraterone from 07/13/2016 through 04/09/2019. 3. Enzalutamide from 04/12/2019 to 11/03/2019.  NGS Results: Foundation 1 MS--stable, TMB 4 Muts/Mb  CURRENT THERAPY: Cabazitaxel + Prednisone q21d  BRIEF ONCOLOGIC HISTORY:  Oncology History  Prostate cancer metastatic to intrathoracic lymph node ([facility])  07/24/2015 Initial Diagnosis   Prostate cancer metastatic to the left supraclavicular and anterior mediastinal lymph nodes   08/21/2015 - 06/12/2016 Chemotherapy   Docetaxel every 3 weeks for 14 cycles, discontinued as PSA has plateaued around 11    07/13/2016 Treatment Plan Change   Zytiga 1000 milligrams daily along with prednisone 5 mg daily, started secondary to fluid overload from docetaxel   10/14/2018 Genetic Testing   Negative genetic testing.  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  The Common Hereditary Gene Panel offered by [facility] includes sequencing and/or deletion duplication testing of the following 48 genes: APC, ATM, AXIN2, BARD1, BMPR1A, BRCA1, BRCA2, BRIP1, CDH1, CDK4, CDKN2A (p14ARF), CDKN2A (p16INK4a), CHEK2, CTNNA1, DICER1, EPCAM (Deletion/duplication testing only), GREM1 (promoter region deletion/duplication testing only), KIT, MEN1, MLH1, MSH2, MSH3, MSH6, MUTYH, NBN, NF1, NHTL1, PALB2, PDGFRA, PMS2, POLD1, POLE, PTEN, RAD50, RAD51C, RAD51D, RNF43, SDHB, SDHC, SDHD, SMAD4, SMARCA4. STK11, TP53, TSC1, TSC2, and VHL.  The following genes were evaluated for sequence changes only: SDHA and HOXB13 c.251G>A variant only. The report date is Oct 14, 2018.   09/18/2019 Genetic Testing   Foundation One  CDx      12/12/2019 Genetic Testing   Guardant 360       12/15/2021 - 01/06/2022 Chemotherapy   Patient is on Treatment Plan : PROSTATE Cabazitaxel + Prednisone q21d     12/15/2021 -  Chemotherapy   Patient is on Treatment Plan : PROSTATE Cabazitaxel (20) D1 + Prednisone D1-21 q21d       CANCER STAGING:  Cancer Staging  No matching staging information was found for the patient.  INTERVAL HISTORY:  Mr. CEDERICK BROADNAX, a 82 y.o. male, seen for follow-up and toxicity assessment prior to next cycle of cabazitaxel.  Energy levels are 75%.  Cough and shortness of breath from COPD has been stable.  He reports loose stools as he is taking magnesium twice daily.  He felt more tired for couple of days after last treatment.  REVIEW OF SYSTEMS:  Review of Systems  Respiratory:  Positive for cough.   Neurological:  Positive for numbness.  All other systems reviewed and are negative.   PAST MEDICAL/SURGICAL HISTORY:  Past Medical History:  Diagnosis Date   COPD (chronic obstructive pulmonary disease) ([facility])    Diabetes mellitus without complication (HCC)    Family history of ovarian cancer    Family history of prostate cancer    Hypertension    Prostate cancer ([facility])    Past Surgical History:  Procedure Laterality Date   GOLD SEED IMPLANT      SOCIAL HISTORY:  Social History   Socioeconomic History   Marital status: Widowed    Spouse name: Not on file   Number of children: Not on file   Years of education: Not on file   Highest education level: Not on file  Occupational History   Not on  file  Tobacco Use   Smoking status: Former    Packs/day: 0.25    Years: 60.00    Total pack years: 15.00    Types: Cigarettes    Quit date: 06/25/2014    Years since quitting: 7.9   Smokeless tobacco: Never   Tobacco comments:    smoked since young age, quit in 2016  Substance and Sexual Activity   Alcohol use: Not Currently   Drug use: No   Sexual activity: Not Currently  Other  Topics Concern   Not on file  Social History Narrative   Not on file   Social Determinants of Health   Financial Resource Strain: Low Risk  (05/21/2020)   Overall Financial Resource Strain (CARDIA)    Difficulty of Paying Living Expenses: Not hard at all  Food Insecurity: No Food Insecurity (05/21/2020)   Hunger Vital Sign    Worried About Running Out of Food in the Last Year: Never true    Ran Out of Food in the Last Year: Never true  Transportation Needs: No Transportation Needs (05/21/2020)   PRAPARE - Hydrologist (Medical): No    Lack of Transportation (Non-Medical): No  Physical Activity: Insufficiently Active (05/21/2020)   Exercise Vital Sign    Days of Exercise per Week: 2 days    Minutes of Exercise per Session: 20 min  Stress: No Stress Concern Present (05/21/2020)   [place]    Feeling of Stress : Not at all  Social Connections: Moderately Isolated (05/21/2020)   Social Connection and Isolation Panel [NHANES]    Frequency of Communication with Friends and Family: More than three times a week    Frequency of Social Gatherings with Friends and Family: More than three times a week    Attends Religious Services: 1 to 4 times per year    Active Member of [employer] or Organizations: No    Attends Archivist Meetings: Never    Marital Status: Widowed  Intimate Partner Violence: Not At Risk (05/21/2020)   Humiliation, Afraid, Rape, and Kick questionnaire    Fear of Current or Ex-Partner: No    Emotionally Abused: No    Physically Abused: No    Sexually Abused: No    FAMILY HISTORY:  Family History  Problem Relation Age of Onset   Ovarian cancer Mother 23   Heart attack Father    Prostate cancer Maternal Uncle 45   Cancer Cousin        pat cousin with unknown cancer   Colon cancer Neg Hx    Pancreatic cancer Neg Hx    Breast cancer Neg Hx     CURRENT  MEDICATIONS:  Current Outpatient Medications  Medication Sig Dispense Refill   ACCU-CHEK GUIDE test strip      Accu-Chek Softclix Lancets lancets      acetaminophen (TYLENOL) 500 MG tablet Take 500 mg by mouth every 6 (six) hours as needed for moderate pain.     albuterol (ACCUNEB) 0.63 MG/3ML nebulizer solution Take 1 ampule by nebulization every 6 (six) hours as needed for wheezing.     albuterol (PROVENTIL) (2.5 MG/3ML) 0.083% nebulizer solution INHALE 3 ML IN NEBULIZER BY MOUTH FOUR TIMES A DAY AS NEEDED     amLODipine (NORVASC) 2.5 MG tablet Take 1 tablet (2.5 mg total) by mouth daily as needed. 30 tablet 0   aspirin 81 MG EC tablet Take 1 tablet by mouth daily.  atorvastatin (LIPITOR) 10 MG tablet TAKE 1 TABLET BY MOUTH EVERY DAY (Patient taking differently: Take 5 mg by mouth daily.) 90 tablet 3   azelastine (ASTELIN) 0.1 % nasal spray USE 2 SPRAYS IN EACH NOSTRIL TWICE A DAY FOR ALLERGIC RHINITIS     benzonatate (TESSALON) 200 MG capsule Take by mouth.     Blood Glucose Monitoring Suppl (ACCU-CHEK GUIDE) w/Device KIT      brompheniramine-pseudoephedrine-DM 30-2-10 MG/5ML syrup TAKE 5 ML BY MOUTH EVERY 4 HOURS FOR 10 DAYS     bumetanide (BUMEX) 1 MG tablet TAKE 1 AND 1/2 TABLETS(1.5 MG) BY MOUTH DAILY 45 tablet 6   CALCIUM PO Take 600 mg by mouth daily.      cetirizine (ZYRTEC) 10 MG tablet Take 1 tablet by mouth daily.     CHERATUSSIN AC 100-10 MG/5ML syrup Take 5 mLs by mouth daily as needed.   0   Cholecalciferol (VITAMIN D3) 50 MCG (2000 UT) capsule Take 2,000 Units by mouth daily.     cyclobenzaprine (FLEXERIL) 10 MG tablet Take 10 mg by mouth as needed.   0   dicyclomine (BENTYL) 20 MG tablet Take 1 tablet by mouth 3 (three) times daily as needed.     dorzolamide (TRUSOPT) 2 % ophthalmic solution Place 1 drop into both eyes 3 (three) times daily.     doxycycline (VIBRAMYCIN) 100 MG capsule Take 100 mg by mouth 2 (two) times daily.     ferrous sulfate 325 (65 FE) MG tablet Take  1 tablet by mouth daily.     fexofenadine (ALLEGRA) 180 MG tablet Take 1 tablet every day by oral route at bedtime for 90 days.     fluticasone (FLONASE) 50 MCG/ACT nasal spray USE 2 SPRAY(S) IN EACH NOSTRIL ONCE DAILY FOR 30 DAYS 15.8 mL 12   guaifenesin (HUMIBID E) 400 MG TABS tablet Take 1 tablet by mouth 4 (four) times daily as needed.     ibuprofen (ADVIL,MOTRIN) 600 MG tablet Take 600 mg by mouth every 6 (six) hours as needed.   0   Ipratropium-Albuterol (COMBIVENT) 20-100 MCG/ACT AERS respimat INHALE 1 PUFF BY MOUTH FOUR TIMES A DAY AS NEEDED COPD     latanoprost (XALATAN) 0.005 % ophthalmic solution INSTILL 1 DROP INTO AFFECTED EYE(S) BY OPHTHALMIC ROUTE ONCE DAILY INTHE EVENING     Latanoprostene Bunod (VYZULTA) 0.024 % SOLN Apply to eye.     Leuprolide Acetate, 4 Month, (ELIGARD) 30 MG injection 30 MG SUBCUTANEOUSLY Q90D PRN     losartan (COZAAR) 100 MG tablet Take 1 tablet by mouth daily.     magnesium oxide (MAG-OX) 400 MG tablet Take 1 tablet (400 mg total) by mouth daily. (Patient taking differently: Take 400 mg by mouth 2 (two) times daily.) 90 tablet 2   metFORMIN (GLUCOPHAGE) 500 MG tablet TAKE 2 TABLETS(1000 MG) BY MOUTH TWICE DAILY WITH A MEAL 120 tablet 3   montelukast (SINGULAIR) 10 MG tablet Take 10 mg by mouth at bedtime.     Multiple Vitamins-Minerals (PRESERVISION AREDS PO) 1 tablet daily.     potassium chloride (MICRO-K) 10 MEQ CR capsule TAKE 4 CAPSULES BY MOUTH WITH FOOD. OPEN AND SPRINKLE ON FOOD 120 capsule 3   predniSONE (DELTASONE) 5 MG tablet Take 1 tablet (5 mg total) by mouth daily with breakfast. 30 tablet 3   prochlorperazine (COMPAZINE) 10 MG tablet Take 1 tablet (10 mg total) by mouth every 6 (six) hours as needed for nausea or vomiting. 60 tablet 3   Semaglutide (  RYBELSUS) 3 MG TABS take 1 tablet po qam; take w/ <4 oz water, 54mn before 1st food/drink/med; do not cut/crush/chew tab; then start Rybelsus 7 mg tablets after 30 days     sucralfate (CARAFATE) 1 g  tablet Take 1 tablet by mouth 4 (four) times daily as needed.     TRELEGY ELLIPTA 100-62.5-25 MCG/INH AEPB INHALE 1 PUFF ONCE DAILY     vitamin C (ASCORBIC ACID) 500 MG tablet Take 500 mg by mouth daily.     No current facility-administered medications for this visit.   Facility-Administered Medications Ordered in Other Visits  Medication Dose Route Frequency Provider Last Rate Last Admin   denosumab (XGEVA) 120 MG/1.7ML injection            diphenhydrAMINE (BENADRYL) 50 MG/ML injection            famotidine (PEPCID) 20-0.9 MG/50ML-% IVPB             ALLERGIES:  Allergies  Allergen Reactions   Lisinopril Cough   Metoprolol Other (See Comments) and Cough    Increases frequency of cough Other reaction(s): Other (See Comments) Increases frequency of cough    PHYSICAL EXAM:  Performance status (ECOG): 1 - Symptomatic but completely ambulatory  Vitals:   06/11/22 0900  BP: (!) 149/82    Wt Readings from Last 3 Encounters:  06/11/22 241 lb 3.2 oz (109.4 kg)  05/20/22 244 lb 0.8 oz (110.7 kg)  04/27/22 242 lb 11.6 oz (110.1 kg)   Physical Exam Vitals reviewed.  Constitutional:      Appearance: Normal appearance. He is obese.  Cardiovascular:     Rate and Rhythm: Normal rate and regular rhythm.     Pulses: Normal pulses.     Heart sounds: Normal heart sounds.  Pulmonary:     Effort: Pulmonary effort is normal.     Breath sounds: Normal breath sounds.  Neurological:     General: No focal deficit present.     Mental Status: He is alert and oriented to person, place, and time.  Psychiatric:        Mood and Affect: Mood normal.        Behavior: Behavior normal.     LABORATORY DATA:  I have reviewed the labs as listed.     Latest Ref Rng & Units 06/11/2022    8:20 AM 05/20/2022   10:38 AM 04/27/2022    8:01 AM  CBC  WBC 4.0 - 10.5 K/uL 13.9  10.2  9.3   Hemoglobin 13.0 - 17.0 g/dL 11.2  10.4  11.0   Hematocrit 39.0 - 52.0 % 35.5  33.0  34.9   Platelets 150 - 400  K/uL 344  403  382       Latest Ref Rng & Units 06/11/2022    8:20 AM 05/20/2022   10:38 AM 04/27/2022    8:01 AM  CMP  Glucose 70 - 99 mg/dL 152  135  152   BUN 8 - 23 mg/dL '17  16  19   '$ Creatinine 0.61 - 1.24 mg/dL 0.94  0.98  1.01   Sodium 135 - 145 mmol/L 141  141  140   Potassium 3.5 - 5.1 mmol/L 3.9  3.9  3.5   Chloride 98 - 111 mmol/L 103  105  103   CO2 22 - 32 mmol/L '28  27  27   '$ Calcium 8.9 - 10.3 mg/dL 9.0  8.9  8.8   Total Protein 6.5 - 8.1 g/dL 6.6  6.1  6.0   Total Bilirubin 0.3 - 1.2 mg/dL 0.9  0.5  0.8   Alkaline Phos 38 - 126 U/L 63  49  44   AST 15 - 41 U/L '21  17  18   '$ ALT 0 - 44 U/L '16  15  15     '$ DIAGNOSTIC IMAGING:  I have independently reviewed the scans and discussed with the patient. No results found.   ASSESSMENT:  1.  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 -Abiraterone and prednisone from 07/13/2016 through 04/09/2019, discontinued secondary to PSA progression. -Enzalutamide from 04/12/2019 through 11/03/2019 with progression. -Last Lupron on 06/09/2019. -CT CAP on 11/10/2019 shows no findings of active malignancy.  Stable small sclerotic lesions at T4 and T10, nonspecific.  Right hydronephrosis with right proximal hydroureter extending down to a 0.8 x 0.8 x 0.4 cm proximal ureteral stone at L3 vertical level.  Nonobstructive right and possibly left nephrolithiasis. -Bone scan on 11/10/2019 shows right proximal tibial metaphyseal activity from recent trauma.  Degenerative findings in the spine, right sternoclavicular joint, shoulders and right foot.  No evidence of metastatic disease. -Foundation 1 test shows MS-stable.  AR amplification.  Sensitivity is reduced due to sample quality. -Guardant 360 on 12/12/2019 MSI high not detected.  TMB was not evaluable.  No other targetable mutations. -F-18-PYLARIFY PET scan  showed intense radiotracer activity in the left supraclavicular and prevascular lymph nodes, measuring 10 mm.  Cluster of small prevascular lymph nodes measures 5-7 mm each with SUV 54.  AP window lymph node measuring 8 mm.  No activity in the prostate gland or abdominal or pelvic lymph nodes.  No skeletal activity. - SBRT to the left supraclavicular lymph node and prevascular mediastinal lymph node from 08/27/2020 through 09/06/2020, 40 Gray in 5 fractions. - CT CAP (10/22/2021): Done at [facility]: No lymphadenopathy.  Cirrhosis.  Intrahepatic and extrahepatic biliary ductal dilatation. - Bone scan (10/28/2021): New focal abnormal tracer uptake at the inferior aspect of the right scapula. - PSMA PET scan (11/13/2021): Interval development of multifocal tracer avid bone metastasis, only 1 of these lesions is visible on the recent nuclear medicine bone scan dated 10/28/2021.  New tracer avid right axillary, subcarinal, left paratracheal, left retrocrural and upper abdominal lymph nodes. - Cycle 1 of cabazitaxel on 12/15/2021, cycle 2 on 01/06/2022   2.  Androgen deprivation therapy induced bone loss: -Received Zometa every 3 months for a year completed on 10/29/2017.   3.  Genetic testing: -Germline mutation testing was negative.    PLAN:  1.  Metastatic prostate cancer to the left supraclavicular and mediastinal lymph nodes: - He had tolerated last cycle except that he had 2 days of more tiredness.  He had right neck cyst removed on Thursday. - Reviewed labs today which showed normal LFTs and creatinine.  CBC was grossly normal.  Last ferritin was 133.  Hemoglobin improved to 11.2 after Venofer. - PSA is down to 5.8. - He will proceed with his next cycle today.  He will receive extra dose of Venofer today.  RTC 3 weeks for follow-up with PSA.  Will plan on repeating scans if there is any rising PSA.   2.  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  Continue denosumab every 6 weeks.   3.   Leg swelling: - Continue Bumex 1 and half tablet daily.  Mild swelling  is stable.   4.  Peripheral neuropathy: - Feet feel cold on the bottom and occasionally numb.  5.  Hypomagnesemia: - Magnesium is 1.9.  Cut back on magnesium to once daily as he is having loose stools.  He was taking twice daily.   Orders placed this encounter:  No orders of the defined types were placed in this encounter.      Derek Jack, MD [facility] [phone]

## 2022-06-11 NOTE — Progress Notes (Signed)
Labs reviewed with MD today. Ok to treat per MD.

## 2022-06-11 NOTE — Progress Notes (Signed)
Patient has been assessed, vital signs and labs have been reviewed by Dr. Delton Coombes. ANC, Creatinine, LFTs, and Platelets are within treatment parameters per Dr. Delton Coombes. The patient is good to proceed with treatment at this time. Patient to receive '300mg'$  Venofer today also. Primary RN and pharmacy aware.

## 2022-06-11 NOTE — Patient Instructions (Signed)
[facility]  Discharge Instructions: Thank you for choosing [facility] to provide your oncology and hematology care.  If you have a lab appointment with the [facility], please come in thru the Main Entrance and check in at the main information desk.  Wear comfortable clothing and clothing appropriate for easy access to any Portacath or PICC line.   We strive to give you quality time with your provider. You may need to reschedule your appointment if you arrive late (15 or more minutes).  Arriving late affects you and other patients whose appointments are after yours.  Also, if you miss three or more appointments without notifying the office, you may be dismissed from the clinic at the provider's discretion.      For prescription refill requests, have your pharmacy contact our office and allow 72 hours for refills to be completed.    Today you received the following chemotherapy and/or immunotherapy agents Jevtana onpro      To help prevent nausea and vomiting after your treatment, we encourage you to take your nausea medication as directed.  BELOW ARE SYMPTOMS THAT SHOULD BE REPORTED IMMEDIATELY: *FEVER GREATER THAN 100.4 F (38 C) OR HIGHER *CHILLS OR SWEATING *NAUSEA AND VOMITING THAT IS NOT CONTROLLED WITH YOUR NAUSEA MEDICATION *UNUSUAL SHORTNESS OF BREATH *UNUSUAL BRUISING OR BLEEDING *URINARY PROBLEMS (pain or burning when urinating, or frequent urination) *BOWEL PROBLEMS (unusual diarrhea, constipation, pain near the anus) TENDERNESS IN MOUTH AND THROAT WITH OR WITHOUT PRESENCE OF ULCERS (sore throat, sores in mouth, or a toothache) UNUSUAL RASH, SWELLING OR PAIN  UNUSUAL VAGINAL DISCHARGE OR ITCHING   Items with * indicate a potential emergency and should be followed up as soon as possible or go to the Emergency Department if any problems should occur.  Please show the CHEMOTHERAPY ALERT CARD or IMMUNOTHERAPY ALERT CARD at check-in to the  Emergency Department and triage nurse.  Should you have questions after your visit or need to cancel or reschedule your appointment, please contact [facility] [phone]  and follow the prompts.  Office hours are 8:00 a.m. to 4:30 p.m. Monday - Friday. Please note that voicemails left after 4:00 p.m. may not be returned until the following business day.  We are closed weekends and major holidays. You have access to a nurse at all times for urgent questions. Please call the main number to the clinic [phone] and follow the prompts.  For any non-urgent questions, you may also contact your provider using MyChart. We now offer e-Visits for anyone 4 and older to request care online for non-urgent symptoms. For details visit [email or web address].   Also download the MyChart app! Go to the app store, search "MyChart", open the app, select [facility], and log in with your MyChart username and password.

## 2022-06-11 NOTE — Progress Notes (Signed)
Discontinue diphenhydramine from oncology treatment plan --> Add Quzyttir (cetirizine) 10 mg IVPush x 1 as premedication for oncology treatment plan.  T.O. Dr Katragadda/Yolande Skoda, PharmD  

## 2022-06-11 NOTE — Progress Notes (Signed)
Patient presents today for Jevtana/ On pro/Venofer per providers order.  Vital signs and labs reviewed by the MD.  Message received from Adonis Huguenin RN/Dr. Delton Coombes patient okay for treatment.  Treatment given today per MD orders.  Stable during infusion without adverse affects.  OnPro applies and is working properly.  Vital signs stable.  No complaints at this time.  Discharge from clinic ambulatory in stable condition.  Alert and oriented X 3.  Follow up with [facility] as scheduled.

## 2022-06-12 ENCOUNTER — Other Ambulatory Visit: Payer: Self-pay

## 2022-06-16 ENCOUNTER — Other Ambulatory Visit: Payer: Self-pay

## 2022-06-17 ENCOUNTER — Other Ambulatory Visit: Payer: Self-pay

## 2022-06-21 ENCOUNTER — Other Ambulatory Visit: Payer: Self-pay

## 2022-06-23 ENCOUNTER — Other Ambulatory Visit: Payer: Self-pay | Admitting: Hematology

## 2022-06-23 DIAGNOSIS — C61 Malignant neoplasm of prostate: Secondary | ICD-10-CM

## 2022-07-02 ENCOUNTER — Inpatient Hospital Stay: Payer: Medicare PPO | Admitting: Hematology

## 2022-07-02 ENCOUNTER — Inpatient Hospital Stay: Payer: Medicare PPO | Attending: Hematology

## 2022-07-02 ENCOUNTER — Encounter: Payer: Self-pay | Admitting: Hematology

## 2022-07-02 ENCOUNTER — Inpatient Hospital Stay: Payer: Medicare PPO

## 2022-07-02 ENCOUNTER — Other Ambulatory Visit: Payer: Self-pay | Admitting: *Deleted

## 2022-07-02 VITALS — BP 152/90 | HR 89 | Temp 97.2°F | Resp 20 | Ht 68.0 in | Wt 242.2 lb

## 2022-07-02 VITALS — BP 137/82 | HR 79 | Temp 97.8°F | Resp 16

## 2022-07-02 DIAGNOSIS — G629 Polyneuropathy, unspecified: Secondary | ICD-10-CM | POA: Insufficient documentation

## 2022-07-02 DIAGNOSIS — Z87891 Personal history of nicotine dependence: Secondary | ICD-10-CM | POA: Insufficient documentation

## 2022-07-02 DIAGNOSIS — Z5189 Encounter for other specified aftercare: Secondary | ICD-10-CM | POA: Diagnosis not present

## 2022-07-02 DIAGNOSIS — C771 Secondary and unspecified malignant neoplasm of intrathoracic lymph nodes: Secondary | ICD-10-CM

## 2022-07-02 DIAGNOSIS — Z7982 Long term (current) use of aspirin: Secondary | ICD-10-CM | POA: Diagnosis not present

## 2022-07-02 DIAGNOSIS — C7951 Secondary malignant neoplasm of bone: Secondary | ICD-10-CM | POA: Insufficient documentation

## 2022-07-02 DIAGNOSIS — C61 Malignant neoplasm of prostate: Secondary | ICD-10-CM | POA: Diagnosis present

## 2022-07-02 DIAGNOSIS — Z5111 Encounter for antineoplastic chemotherapy: Secondary | ICD-10-CM | POA: Diagnosis present

## 2022-07-02 DIAGNOSIS — Z79899 Other long term (current) drug therapy: Secondary | ICD-10-CM | POA: Insufficient documentation

## 2022-07-02 DIAGNOSIS — M7989 Other specified soft tissue disorders: Secondary | ICD-10-CM | POA: Insufficient documentation

## 2022-07-02 LAB — CBC WITH DIFFERENTIAL/PLATELET
Abs Immature Granulocytes: 0.08 10*3/uL — ABNORMAL HIGH (ref 0.00–0.07)
Basophils Absolute: 0.1 10*3/uL (ref 0.0–0.1)
Basophils Relative: 1 %
Eosinophils Absolute: 0.1 10*3/uL (ref 0.0–0.5)
Eosinophils Relative: 1 %
HCT: 34 % — ABNORMAL LOW (ref 39.0–52.0)
Hemoglobin: 10.8 g/dL — ABNORMAL LOW (ref 13.0–17.0)
Immature Granulocytes: 1 %
Lymphocytes Relative: 7 %
Lymphs Abs: 1 10*3/uL (ref 0.7–4.0)
MCH: 30.1 pg (ref 26.0–34.0)
MCHC: 31.8 g/dL (ref 30.0–36.0)
MCV: 94.7 fL (ref 80.0–100.0)
Monocytes Absolute: 1.2 10*3/uL — ABNORMAL HIGH (ref 0.1–1.0)
Monocytes Relative: 9 %
Neutro Abs: 11.6 10*3/uL — ABNORMAL HIGH (ref 1.7–7.7)
Neutrophils Relative %: 81 %
Platelets: 262 10*3/uL (ref 150–400)
RBC: 3.59 MIL/uL — ABNORMAL LOW (ref 4.22–5.81)
RDW: 16.9 % — ABNORMAL HIGH (ref 11.5–15.5)
WBC: 14.1 10*3/uL — ABNORMAL HIGH (ref 4.0–10.5)
nRBC: 0 % (ref 0.0–0.2)

## 2022-07-02 LAB — COMPREHENSIVE METABOLIC PANEL
ALT: 15 U/L (ref 0–44)
AST: 21 U/L (ref 15–41)
Albumin: 3.8 g/dL (ref 3.5–5.0)
Alkaline Phosphatase: 57 U/L (ref 38–126)
Anion gap: 10 (ref 5–15)
BUN: 15 mg/dL (ref 8–23)
CO2: 28 mmol/L (ref 22–32)
Calcium: 8.3 mg/dL — ABNORMAL LOW (ref 8.9–10.3)
Chloride: 102 mmol/L (ref 98–111)
Creatinine, Ser: 0.92 mg/dL (ref 0.61–1.24)
GFR, Estimated: >60 mL/min (ref >60)
Glucose, Bld: 157 mg/dL — ABNORMAL HIGH (ref 70–99)
Potassium: 3.4 mmol/L — ABNORMAL LOW (ref 3.5–5.1)
Sodium: 140 mmol/L (ref 135–145)
Total Bilirubin: 0.9 mg/dL (ref 0.3–1.2)
Total Protein: 6.2 g/dL — ABNORMAL LOW (ref 6.5–8.1)

## 2022-07-02 LAB — PSA: Prostatic Specific Antigen: 8.32 ng/mL — ABNORMAL HIGH (ref 0.00–4.00)

## 2022-07-02 LAB — MAGNESIUM: Magnesium: 1.7 mg/dL (ref 1.7–2.4)

## 2022-07-02 MED ORDER — HEPARIN SOD (PORK) LOCK FLUSH 100 UNIT/ML IV SOLN
500.0000 [IU] | Freq: Once | INTRAVENOUS | Status: AC | PRN
  Administered 2022-07-02: 500 [IU]

## 2022-07-02 MED ORDER — ONDANSETRON HCL 4 MG/2ML IJ SOLN
8.0000 mg | Freq: Once | INTRAMUSCULAR | Status: AC
  Administered 2022-07-02: 8 mg via INTRAVENOUS
  Filled 2022-07-02: qty 4

## 2022-07-02 MED ORDER — SODIUM CHLORIDE 0.9 % IV SOLN: Freq: Once | INTRAVENOUS | Status: AC

## 2022-07-02 MED ORDER — FAMOTIDINE IN NACL 20-0.9 MG/50ML-% IV SOLN
20.0000 mg | Freq: Once | INTRAVENOUS | Status: AC
  Administered 2022-07-02: 20 mg via INTRAVENOUS
  Filled 2022-07-02: qty 50

## 2022-07-02 MED ORDER — DENOSUMAB 120 MG/1.7ML ~~LOC~~ SOLN
120.0000 mg | Freq: Once | SUBCUTANEOUS | Status: AC
  Administered 2022-07-02: 120 mg via SUBCUTANEOUS
  Filled 2022-07-02: qty 1.7

## 2022-07-02 MED ORDER — SODIUM CHLORIDE 0.9% FLUSH
10.0000 mL | Freq: Once | INTRAVENOUS | Status: AC
  Administered 2022-07-02: 10 mL via INTRAVENOUS

## 2022-07-02 MED ORDER — SODIUM CHLORIDE 0.9% FLUSH
10.0000 mL | INTRAVENOUS | Status: DC | PRN
  Administered 2022-07-02: 10 mL

## 2022-07-02 MED ORDER — SODIUM CHLORIDE 0.9 % IV SOLN
10.0000 mg | Freq: Once | INTRAVENOUS | Status: AC
  Administered 2022-07-02: 10 mg via INTRAVENOUS
  Filled 2022-07-02: qty 10

## 2022-07-02 MED ORDER — PEGFILGRASTIM 6 MG/0.6ML ~~LOC~~ PSKT
6.0000 mg | PREFILLED_SYRINGE | Freq: Once | SUBCUTANEOUS | Status: AC
  Administered 2022-07-02: 6 mg via SUBCUTANEOUS
  Filled 2022-07-02: qty 0.6

## 2022-07-02 MED ORDER — CETIRIZINE HCL 10 MG/ML IV SOLN
10.0000 mg | Freq: Once | INTRAVENOUS | Status: AC
  Administered 2022-07-02: 10 mg via INTRAVENOUS
  Filled 2022-07-02: qty 1

## 2022-07-02 MED ORDER — SODIUM CHLORIDE 0.9 % IV SOLN
20.0000 mg/m2 | Freq: Once | INTRAVENOUS | Status: AC
  Administered 2022-07-02: 45 mg via INTRAVENOUS
  Filled 2022-07-02: qty 4.5

## 2022-07-02 MED ORDER — PSEUDOEPH-BROMPHEN-DM 30-2-10 MG/5ML PO SYRP: ORAL_SOLUTION | ORAL | 0 refills | Status: DC

## 2022-07-02 NOTE — Patient Instructions (Signed)
[name]  Discharge Instructions: Thank you for choosing [facility] to provide your oncology and hematology care.  If you have a lab appointment with the [facility], please come in thru the Main Entrance and check in at the main information desk.  Wear comfortable clothing and clothing appropriate for easy access to any Portacath or PICC line.   We strive to give you quality time with your provider. You may need to reschedule your appointment if you arrive late (15 or more minutes).  Arriving late affects you and other patients whose appointments are after yours.  Also, if you miss three or more appointments without notifying the office, you may be dismissed from the clinic at the provider's discretion.      For prescription refill requests, have your pharmacy contact our office and allow 72 hours for refills to be completed.    Today you received the following chemotherapy and/or immunotherapy agents Jevtana and Neulasta OnPro   To help prevent nausea and vomiting after your treatment, we encourage you to take your nausea medication as directed.  Cabazitaxel Injection What is this medication? CABAZITAXEL (ka BAZ i TAX el) treats prostate cancer. It works by slowing down the growth of cancer cells. This medicine may be used for other purposes; ask your health care provider or pharmacist if you have questions. COMMON BRAND NAME(S): Jevtana What should I tell my care team before I take this medication? They need to know if you have any of these conditions: Kidney problems Liver disease Low white blood cell levels Lung disease Stomach or intestine problems An unusual or allergic reaction to cabazitaxel, polysorbate 80, other medications, foods, dyes, or preservatives Pregnant or trying to get pregnant Breast-feeding How should I use this medication? This medication is injected into a vein. It is given by your care team in a hospital or clinic  setting. Talk to your care team about the use of this medication in children. Special care may be needed. Overdosage: If you think you have taken too much of this medicine contact a poison control center or emergency room at once. NOTE: This medicine is only for you. Do not share this medicine with others. What if I miss a dose? Keep appointments for follow-up doses. It is important not to miss your dose. Call your care team if you are unable to keep an appointment. What may interact with this medication? Certain antibiotics, such as clarithromycin or telithromycin Certain antivirals for HIV or AIDS Certain medications for fungal infections like ketoconazole, itraconazole, and voriconazole Nefazodone This list may not describe all possible interactions. Give your health care provider a list of all the medicines, herbs, non-prescription drugs, or dietary supplements you use. Also tell them if you smoke, drink alcohol, or use illegal drugs. Some items may interact with your medicine. What should I watch for while using this medication? This medication may make you feel generally unwell. This is not uncommon as chemotherapy can affect healthy cells as well as cancer cells. Report any side effects. Continue your course of treatment even though you feel ill unless your care team tells you to stop. You may need blood work while you are taking this medication. This medication may increase your risk of getting an infection. Call your care team for advice if you get a fever, chills, sore throat, or other symptoms of a cold or flu. Do not treat yourself. Try to avoid being around people who are sick. Avoid taking medications  that contain aspirin, acetaminophen, ibuprofen, naproxen, or ketoprofen unless instructed by your care team. These medications may hide a fever. Be careful brushing or flossing your teeth or using a toothpick because you may get an infection or bleed more easily. If you have any dental  work done, tell your dentist you are receiving this medication. This medication can cause serious infusion reactions. To reduce the risk, your care team may give you other medications to take before receiving this one. Be sure to follow the directions from your care team. Use a condom during sex while taking this medication and for 4 months after the last dose. Talk to your care team right away if your partner may be pregnant. This medication can cause serious birth defects. This medication may cause infertility. Talk to your care team if you are concerned about your fertility. What side effects may I notice from receiving this medication? Side effects that you should report to your care team as soon as possible: Allergic reactions--skin rash, itching, hives, swelling of the face, lips, tongue, or throat Diarrhea, nausea, vomiting Dry cough, shortness of breath or trouble breathing Infection--fever, chills, cough, or sore throat Kidney injury--decrease in the amount of urine, swelling of the ankles, hands, or feet Pain, tingling, or numbness in the hands or feet Red or dark brown urine Stomach bleeding--bloody or black, tar-like stools, vomiting blood or brown material that looks like coffee grounds Stomach pain that is severe, does not away, or gets worse Unusual bruising or bleeding Side effects that usually do not require medical attention (report these to your care team if they continue or are bothersome): Loss of appetite Unusual weakness or fatigue This list may not describe all possible side effects. Call your doctor for medical advice about side effects. You may report side effects to FDA at 1-800-FDA-1088. Where should I keep my medication? This medication is given in a hospital or clinic. It will not be stored at home. NOTE: This sheet is a summary. It may not cover all possible information. If you have questions about this medicine, talk to your doctor, pharmacist, or health care  provider.  2023 Elsevier/Gold Standard (2008-11-21 00:00:00)   BELOW ARE SYMPTOMS THAT SHOULD BE REPORTED IMMEDIATELY: *FEVER GREATER THAN 100.4 F (38 C) OR HIGHER *CHILLS OR SWEATING *NAUSEA AND VOMITING THAT IS NOT CONTROLLED WITH YOUR NAUSEA MEDICATION *UNUSUAL SHORTNESS OF BREATH *UNUSUAL BRUISING OR BLEEDING *URINARY PROBLEMS (pain or burning when urinating, or frequent urination) *BOWEL PROBLEMS (unusual diarrhea, constipation, pain near the anus) TENDERNESS IN MOUTH AND THROAT WITH OR WITHOUT PRESENCE OF ULCERS (sore throat, sores in mouth, or a toothache) UNUSUAL RASH, SWELLING OR PAIN  UNUSUAL VAGINAL DISCHARGE OR ITCHING   Items with * indicate a potential emergency and should be followed up as soon as possible or go to the Emergency Department if any problems should occur.  Please show the CHEMOTHERAPY ALERT CARD or IMMUNOTHERAPY ALERT CARD at check-in to the Emergency Department and triage nurse.  Should you have questions after your visit or need to cancel or reschedule your appointment, please contact [facility] [phone]  and follow the prompts.  Office hours are 8:00 a.m. to 4:30 p.m. Monday - Friday. Please note that voicemails left after 4:00 p.m. may not be returned until the following business day.  We are closed weekends and major holidays. You have access to a nurse at all times for urgent questions. Please call the main number to the clinic [phone] and follow  the prompts.  For any non-urgent questions, you may also contact your provider using MyChart. We now offer e-Visits for anyone 39 and older to request care online for non-urgent symptoms. For details visit [email or web address].   Also download the MyChart app! Go to the app store, search "MyChart", open the app, select [facility], and log in with your MyChart username and password.

## 2022-07-02 NOTE — Progress Notes (Addendum)
Pt presents today for Jevtana, Neulasta OnPro, and Xgeva injection per provider's order. Vital signs and labs WNL for treatment. Okay to proceed with treatment today per Dr.K. Pt's Calcium noted to be 8.3 today.  Pt denies tooth or jaw pain, and no recent or future dental appointments. Pt reports taking Calcium and Vit D as supplements as directed.  Jevtana and Neulasta OnPro placed on right arm, and Xgeva given today per MD orders. Tolerated infusion without adverse affects. Vital signs stable. No complaints at this time. Discharged from clinic ambulatory in stable condition. Alert and oriented x 3. F/U with [facility] as scheduled.

## 2022-07-02 NOTE — Progress Notes (Signed)
Patient has been examined by Dr. Katragadda, and vital signs and labs have been reviewed. ANC, Creatinine, LFTs, hemoglobin, and platelets are within treatment parameters per M.D. - pt may proceed with treatment.  Primary RN and pharmacy notified.  

## 2022-07-02 NOTE — Patient Instructions (Addendum)
[facility] at [facility] Discharge Instructions   You were seen and examined today by Dr. Delton Coombes.  He reviewed the results of your lab work which are normal/stable.  You PSA went up from 5.8 to 8.1. It can fluctuate like this.   We will proceed with your treatment today.  Return as scheduled.    Thank you for choosing [facility] at [facility] to provide your oncology and hematology care.  To afford each patient quality time with our provider, please arrive at least 15 minutes before your scheduled appointment time.   If you have a lab appointment with the [facility] please come in thru the Main Entrance and check in at the main information desk.  You need to re-schedule your appointment should you arrive 10 or more minutes late.  We strive to give you quality time with our providers, and arriving late affects you and other patients whose appointments are after yours.  Also, if you no show three or more times for appointments you may be dismissed from the clinic at the providers discretion.     Again, thank you for choosing [facility].  Our hope is that these requests will decrease the amount of time that you wait before being seen by our physicians.       _____________________________________________________________  Should you have questions after your visit to [facility], please contact our office at [phone] and follow the prompts.  Our office hours are 8:00 a.m. and 4:30 p.m. Monday - Friday.  Please note that voicemails left after 4:00 p.m. may not be returned until the following business day.  We are closed weekends and major holidays.  You do have access to a nurse 24-7, just call the main number to the clinic [phone] and do not press any options, hold on the line and a nurse will answer the phone.    For prescription refill requests, have your pharmacy contact our office and allow 72 hours.     Due to Covid, you will need to wear a mask upon entering the hospital. If you do not have a mask, a mask will be given to you at the Main Entrance upon arrival. For doctor visits, patients may have 1 support person age 64 or older with them. For treatment visits, patients can not have anyone with them due to social distancing guidelines and our immunocompromised population.

## 2022-07-02 NOTE — Progress Notes (Signed)
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 Docetaxel x 14 cycles from 08/11/2015 through 06/12/2016. 2. Abiraterone from 07/13/2016 through 04/09/2019. 3. Enzalutamide from 04/12/2019 to 11/03/2019.  NGS Results: Foundation 1 MS--stable, TMB 4 Muts/Mb  CURRENT THERAPY: Cabazitaxel + Prednisone q21d  BRIEF ONCOLOGIC HISTORY:  Oncology History  Prostate cancer metastatic to intrathoracic lymph node ([facility])  07/24/2015 Initial Diagnosis   Prostate cancer metastatic to the left supraclavicular and anterior mediastinal lymph nodes   08/21/2015 - 06/12/2016 Chemotherapy   Docetaxel every 3 weeks for 14 cycles, discontinued as PSA has plateaued around 11    07/13/2016 Treatment Plan Change   Zytiga 1000 milligrams daily along with prednisone 5 mg daily, started secondary to fluid overload from docetaxel   10/14/2018 Genetic Testing   Negative genetic testing.  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  The Common Hereditary Gene Panel offered by [facility] includes sequencing and/or deletion duplication testing of the following 48 genes: APC, ATM, AXIN2, BARD1, BMPR1A, BRCA1, BRCA2, BRIP1, CDH1, CDK4, CDKN2A (p14ARF), CDKN2A (p16INK4a), CHEK2, CTNNA1, DICER1, EPCAM (Deletion/duplication testing only), GREM1 (promoter region deletion/duplication testing only), KIT, MEN1, MLH1, MSH2, MSH3, MSH6, MUTYH, NBN, NF1, NHTL1, PALB2, PDGFRA, PMS2, POLD1, POLE, PTEN, RAD50, RAD51C, RAD51D, RNF43, SDHB, SDHC, SDHD, SMAD4, SMARCA4. STK11, TP53, TSC1, TSC2, and VHL.  The following genes were evaluated for sequence changes only: SDHA and HOXB13 c.251G>A variant only. The report date is Oct 14, 2018.   09/18/2019 Genetic Testing   Foundation One  CDx      12/12/2019 Genetic Testing   Guardant 360       12/15/2021 - 01/06/2022 Chemotherapy   Patient is on Treatment Plan : PROSTATE Cabazitaxel + Prednisone q21d     12/15/2021 -  Chemotherapy   Patient is on Treatment Plan : PROSTATE Cabazitaxel (20) D1 + Prednisone D1-21 q21d       CANCER STAGING:  Cancer Staging  No matching staging information was found for the patient.  INTERVAL HISTORY:  [name], a 82 y.o. 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  He was last seen by me on 06/11/22.  Today, he states that he is doing well overall. His appetite level is at 100%. His energy level is at 75%. He did notice much improvement of his energy after the iron infusion on 06/20/22. His numbness in his fingertips and cold sensation in his feet are unchanged. His leg swelling is well controlled with Bumex.   He has had a dry cough for the last x2 weeks. The cough may occur intermittently throughout the day. He has been using his Allegra without much relief. He is using his Trelegy inhaler daily but has not used his rescue inhaler. He occasionally uses his nebulized Albuterol when he has wheezing. He has brompheniramine-pseudoephedrine-DM cough syrup at home but never started this. He has Best boy at home but these do not provide any relief.   REVIEW OF SYSTEMS:  Review of Systems  Constitutional:  Positive for fatigue. Negative for chills and fever.  HENT:   Negative for lump/mass, mouth sores, nosebleeds, sore throat and trouble swallowing.   Respiratory:  Positive for cough and shortness  of breath.   Cardiovascular:  Negative for chest pain, leg swelling and palpitations.  Gastrointestinal:  Negative for abdominal pain, constipation, diarrhea, nausea and vomiting.  Genitourinary:  Negative for bladder incontinence, difficulty urinating, dysuria, frequency, hematuria and  nocturia.   Musculoskeletal:  Negative for arthralgias, back pain, flank pain, myalgias and neck pain.  Skin:  Negative for itching and rash.  Neurological:  Positive for numbness (fingers). Negative for dizziness and headaches.  Hematological:  Does not bruise/bleed easily.  Psychiatric/Behavioral:  Negative for depression, sleep disturbance and suicidal ideas. The patient is not nervous/anxious.   All other systems reviewed and are negative.   PAST MEDICAL/SURGICAL HISTORY:  Past Medical History:  Diagnosis Date   COPD (chronic obstructive pulmonary disease) ([facility])    Diabetes mellitus without complication (H[facility])    Family history of ovarian cancer    Family history of prostate cancer    Hypertension    Prostate cancer ([facility])    Past Surgical History:  Procedure Laterality Date   GOLD SEED IMPLANT      SOCIAL HISTORY:  Social History   Socioeconomic History   Marital status: Widowed    Spouse name: Not on file   Number of children: Not on file   Years of education: Not on file   Highest education level: Not on file  Occupational History   Not on file  Tobacco Use   Smoking status: Former    Packs/day: 0.25    Years: 60.00    Total pack years: 15.00    Types: Cigarettes    Quit date: 06/25/2014    Years since quitting: 8.0   Smokeless tobacco: Never   Tobacco comments:    smoked since young age, quit in 2016  Substance and Sexual Activity   Alcohol use: Not Currently   Drug use: No   Sexual activity: Not Currently  Other Topics Concern   Not on file  Social History Narrative   Not on file   Social Determinants of Health   Financial Resource Strain: Low Risk  (05/21/2020)   Overall Financial Resource Strain (CARDIA)    Difficulty of Paying Living Expenses: Not hard at all  Food Insecurity: No Food Insecurity (05/21/2020)   Hunger Vital Sign    Worried About Running Out of Food in the Last Year: Never true    Loop in the Last Year: Never true   Transportation Needs: No Transportation Needs (05/21/2020)   PRAPARE - Hydrologist (Medical): No    Lack of Transportation (Non-Medical): No  Physical Activity: Insufficiently Active (05/21/2020)   Exercise Vital Sign    Days of Exercise per Week: 2 days    Minutes of Exercise per Session: 20 min  Stress: No Stress Concern Present (05/21/2020)   [place]    Feeling of Stress : Not at all  Social Connections: Moderately Isolated (05/21/2020)   Social Connection and Isolation Panel [NHANES]    Frequency of Communication with Friends and Family: More than three times a week    Frequency of Social Gatherings with Friends and Family: More than three times a week    Attends Religious Services: 1 to 4 times per year    Active Member of [employer] or Organizations: No    Attends Archivist Meetings: Never    Marital Status: Widowed  Intimate Partner Violence: Not At Risk (05/21/2020)   Humiliation, Afraid, Rape, and Kick questionnaire  Fear of Current or Ex-Partner: No    Emotionally Abused: No    Physically Abused: No    Sexually Abused: No    FAMILY HISTORY:  Family History  Problem Relation Age of Onset   Ovarian cancer Mother 68   Heart attack Father    Prostate cancer Maternal Uncle 52   Cancer Cousin        pat cousin with unknown cancer   Colon cancer Neg Hx    Pancreatic cancer Neg Hx    Breast cancer Neg Hx     CURRENT MEDICATIONS:  Current Outpatient Medications  Medication Sig Dispense Refill   ACCU-CHEK GUIDE test strip      Accu-Chek Softclix Lancets lancets      acetaminophen (TYLENOL) 500 MG tablet Take 500 mg by mouth every 6 (six) hours as needed for moderate pain.     albuterol (ACCUNEB) 0.63 MG/3ML nebulizer solution Take 1 ampule by nebulization every 6 (six) hours as needed for wheezing.     albuterol (PROVENTIL) (2.5 MG/3ML) 0.083% nebulizer solution  INHALE 3 ML IN NEBULIZER BY MOUTH FOUR TIMES A DAY AS NEEDED     amLODipine (NORVASC) 2.5 MG tablet Take 1 tablet (2.5 mg total) by mouth daily as needed. 30 tablet 0   aspirin 81 MG EC tablet Take 1 tablet by mouth daily.     atorvastatin (LIPITOR) 10 MG tablet TAKE 1 TABLET BY MOUTH EVERY DAY (Patient taking differently: Take 5 mg by mouth daily.) 90 tablet 3   azelastine (ASTELIN) 0.1 % nasal spray USE 2 SPRAYS IN EACH NOSTRIL TWICE A DAY FOR ALLERGIC RHINITIS     benzonatate (TESSALON) 200 MG capsule Take by mouth.     Blood Glucose Monitoring Suppl (ACCU-CHEK GUIDE) w/Device KIT      brompheniramine-pseudoephedrine-DM 30-2-10 MG/5ML syrup TAKE 5 ML BY MOUTH EVERY 4 HOURS FOR 10 DAYS     bumetanide (BUMEX) 1 MG tablet TAKE 1 AND 1/2 TABLETS(1.5 MG) BY MOUTH DAILY 45 tablet 6   CALCIUM PO Take 600 mg by mouth daily.      cetirizine (ZYRTEC) 10 MG tablet Take 1 tablet by mouth daily.     CHERATUSSIN AC 100-10 MG/5ML syrup Take 5 mLs by mouth daily as needed.   0   Cholecalciferol (VITAMIN D3) 50 MCG (2000 UT) capsule Take 2,000 Units by mouth daily.     cyclobenzaprine (FLEXERIL) 10 MG tablet Take 10 mg by mouth as needed.   0   dicyclomine (BENTYL) 20 MG tablet Take 1 tablet by mouth 3 (three) times daily as needed.     dorzolamide (TRUSOPT) 2 % ophthalmic solution Place 1 drop into both eyes 3 (three) times daily.     doxycycline (VIBRAMYCIN) 100 MG capsule Take 100 mg by mouth 2 (two) times daily.     ferrous sulfate 325 (65 FE) MG tablet Take 1 tablet by mouth daily.     fexofenadine (ALLEGRA) 180 MG tablet Take 1 tablet every day by oral route at bedtime for 90 days.     fluticasone (FLONASE) 50 MCG/ACT nasal spray USE 2 SPRAY(S) IN EACH NOSTRIL ONCE DAILY FOR 30 DAYS 15.8 mL 12   guaifenesin (HUMIBID E) 400 MG TABS tablet Take 1 tablet by mouth 4 (four) times daily as needed.     ibuprofen (ADVIL,MOTRIN) 600 MG tablet Take 600 mg by mouth every 6 (six) hours as needed.   0    Ipratropium-Albuterol (COMBIVENT) 20-100 MCG/ACT AERS respimat INHALE 1 PUFF BY  MOUTH FOUR TIMES A DAY AS NEEDED COPD     latanoprost (XALATAN) 0.005 % ophthalmic solution INSTILL 1 DROP INTO AFFECTED EYE(S) BY OPHTHALMIC ROUTE ONCE DAILY INTHE EVENING     Latanoprostene Bunod (VYZULTA) 0.024 % SOLN Apply to eye.     Leuprolide Acetate, 4 Month, (ELIGARD) 30 MG injection 30 MG SUBCUTANEOUSLY Q90D PRN     losartan (COZAAR) 100 MG tablet Take 1 tablet by mouth daily.     magnesium oxide (MAG-OX) 400 MG tablet Take 1 tablet (400 mg total) by mouth daily. (Patient taking differently: Take 400 mg by mouth 2 (two) times daily.) 90 tablet 2   metFORMIN (GLUCOPHAGE) 500 MG tablet TAKE 2 TABLETS(1000 MG) BY MOUTH TWICE DAILY WITH A MEAL 120 tablet 3   montelukast (SINGULAIR) 10 MG tablet Take 10 mg by mouth at bedtime.     Multiple Vitamins-Minerals (PRESERVISION AREDS PO) 1 tablet daily.     potassium chloride (MICRO-K) 10 MEQ CR capsule TAKE 4 CAPSULES BY MOUTH WITH FOOD. OPEN AND SPRINKLE ON FOOD 120 capsule 3   predniSONE (DELTASONE) 5 MG tablet Take 1 tablet (5 mg total) by mouth daily with breakfast. 30 tablet 3   prochlorperazine (COMPAZINE) 10 MG tablet Take 1 tablet (10 mg total) by mouth every 6 (six) hours as needed for nausea or vomiting. 60 tablet 3   Semaglutide (RYBELSUS) 3 MG TABS take 1 tablet po qam; take w/ <4 oz water, 69mn before 1st food/drink/med; do not cut/crush/chew tab; then start Rybelsus 7 mg tablets after 30 days     sucralfate (CARAFATE) 1 g tablet Take 1 tablet by mouth 4 (four) times daily as needed.     TRELEGY ELLIPTA 100-62.5-25 MCG/INH AEPB INHALE 1 PUFF ONCE DAILY     vitamin C (ASCORBIC ACID) 500 MG tablet Take 500 mg by mouth daily.     No current facility-administered medications for this visit.   Facility-Administered Medications Ordered in Other Visits  Medication Dose Route Frequency Provider Last Rate Last Admin   denosumab (XGEVA) 120 MG/1.7ML injection             diphenhydrAMINE (BENADRYL) 50 MG/ML injection            famotidine (PEPCID) 20-0.9 MG/50ML-% IVPB             ALLERGIES:  Allergies  Allergen Reactions   Lisinopril Cough   Metoprolol Other (See Comments) and Cough    Increases frequency of cough Other reaction(s): Other (See Comments) Increases frequency of cough    PHYSICAL EXAM:  Performance status (ECOG): 1 - Symptomatic but completely ambulatory  There were no vitals filed for this visit.   Wt Readings from Last 3 Encounters:  06/11/22 109.4 kg (241 lb 3.2 oz)  05/20/22 110.7 kg (244 lb 0.8 oz)  04/27/22 110.1 kg (242 lb 11.6 oz)   Physical Exam Vitals and nursing note reviewed. Exam conducted with a chaperone present.  Constitutional:      Appearance: Normal appearance.  Cardiovascular:     Rate and Rhythm: Normal rate and regular rhythm.     Pulses: Normal pulses.     Heart sounds: Normal heart sounds.  Pulmonary:     Effort: Pulmonary effort is normal.     Breath sounds: Normal breath sounds.  Abdominal:     Palpations: Abdomen is soft. There is no hepatomegaly, splenomegaly or mass.     Tenderness: There is no abdominal tenderness.  Lymphadenopathy:     Cervical: No cervical adenopathy.  Right cervical: No superficial cervical adenopathy.    Left cervical: No superficial cervical adenopathy.  Neurological:     General: No focal deficit present.     Mental Status: He is alert and oriented to person, place, and time.  Psychiatric:        Mood and Affect: Mood normal.        Behavior: Behavior normal.     LABORATORY DATA:  I have reviewed the labs as listed.     Latest Ref Rng & Units 06/11/2022    8:20 AM 05/20/2022   10:38 AM 04/27/2022    8:01 AM  CBC  WBC 4.0 - 10.5 K/uL 13.9  10.2  9.3   Hemoglobin 13.0 - 17.0 g/dL 11.2  10.4  11.0   Hematocrit 39.0 - 52.0 % 35.5  33.0  34.9   Platelets 150 - 400 K/uL 344  403  382       Latest Ref Rng & Units 06/11/2022    8:20 AM 05/20/2022    10:38 AM 04/27/2022    8:01 AM  CMP  Glucose 70 - 99 mg/dL 152  135  152   BUN 8 - 23 mg/dL '17  16  19   '$ Creatinine 0.61 - 1.24 mg/dL 0.94  0.98  1.01   Sodium 135 - 145 mmol/L 141  141  140   Potassium 3.5 - 5.1 mmol/L 3.9  3.9  3.5   Chloride 98 - 111 mmol/L 103  105  103   CO2 22 - 32 mmol/L '28  27  27   '$ Calcium 8.9 - 10.3 mg/dL 9.0  8.9  8.8   Total Protein 6.5 - 8.1 g/dL 6.6  6.1  6.0   Total Bilirubin 0.3 - 1.2 mg/dL 0.9  0.5  0.8   Alkaline Phos 38 - 126 U/L 63  49  44   AST 15 - 41 U/L '21  17  18   '$ ALT 0 - 44 U/L '16  15  15    '$ Component     Latest Ref Rng 04/27/2022 05/20/2022 06/11/2022  Prostatic Specific Antigen     0.00 - 4.00 ng/mL 8.18 (H)  5.80 (H)  8.13 (H)       DIAGNOSTIC IMAGING:  I have independently reviewed the scans and discussed with the patient. No results found.   ASSESSMENT:  1.  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 -Abiraterone and prednisone from 07/13/2016 through 04/09/2019, discontinued secondary to PSA progression. -Enzalutamide from 04/12/2019 through 11/03/2019 with progression. -Last Lupron on 06/09/2019. -CT CAP on 11/10/2019 shows no findings of active malignancy.  Stable small sclerotic lesions at T4 and T10, nonspecific.  Right hydronephrosis with right proximal hydroureter extending down to a 0.8 x 0.8 x 0.4 cm proximal ureteral stone at L3 vertical level.  Nonobstructive right and possibly left nephrolithiasis. -Bone scan on 11/10/2019 shows right proximal tibial metaphyseal activity from recent trauma.  Degenerative findings in the spine, right sternoclavicular joint, shoulders and right foot.  No evidence of metastatic disease. -Foundation 1 test shows MS-stable.  AR amplification.  Sensitivity is reduced due to sample quality. -Guardant 360 on 12/12/2019 MSI high not detected.  TMB was not  evaluable.  No other targetable mutations. -F-18-PYLARIFY PET scan showed intense radiotracer activity in the left supraclavicular and prevascular lymph nodes, measuring 10 mm.  Cluster of small prevascular lymph nodes measures  5-7 mm each with SUV 54.  AP window lymph node measuring 8 mm.  No activity in the prostate gland or abdominal or pelvic lymph nodes.  No skeletal activity. - SBRT to the left supraclavicular lymph node and prevascular mediastinal lymph node from 08/27/2020 through 09/06/2020, 40 Gray in 5 fractions. - CT CAP (10/22/2021): [facility] at [facility]: No lymphadenopathy.  Cirrhosis.  Intrahepatic and extrahepatic biliary ductal dilatation. - Bone scan (10/28/2021): New focal abnormal tracer uptake at the inferior aspect of the right scapula. - PSMA PET scan (11/13/2021): Interval development of multifocal tracer avid bone metastasis, only 1 of these lesions is visible on the recent nuclear medicine bone scan dated 10/28/2021.  New tracer avid right axillary, subcarinal, left paratracheal, left retrocrural and upper abdominal lymph nodes. - Cycle 1 of cabazitaxel on 12/15/2021, cycle 2 on 01/06/2022   2.  Androgen deprivation therapy induced bone loss: -Received Zometa every 3 months for a year completed on 10/29/2017.   3.  Genetic testing: -Germline mutation testing was negative.    PLAN:  1.  Metastatic prostate cancer to the left supraclavicular and mediastinal lymph nodes: - He has tolerated cycle 9 reasonably well. - Reviewed labs today which showed normal LFTs, creatinine.  CBC was grossly normal.  He has received second Venofer infusion at last visit 3 weeks ago.  Hemoglobin today is 10.8. - Last PSA is 8.13 on 06/11/2022, up from 5.8 on 05/20/2022.  Until then it was trending down.  Will closely monitor PSA today. - He will proceed with cycle 10 with regular dose.  RTC 3 weeks for follow-up with repeat labs.  If PSA continues to trend up, will consider repeat scanning.   2.  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  Continue denosumab every 6 weeks.   3.  Leg swelling: - Continue Bumex 1 and half tablets daily.  Mild swelling is stable.   4.  Peripheral neuropathy: - Numbness in the feet has been stable.  5.  Hypomagnesemia: - Continue magnesium once daily.  Magnesium is normal today at 1.7.   Orders placed this encounter:  Orders Placed This Encounter  Procedures   CT CHEST ABDOMEN PELVIS W CONTRAST       I,Alexis Herring,acting as a scribe for Derek Jack, MD.,have documented all relevant documentation on the behalf of Derek Jack, MD,as directed by  Derek Jack, MD while in the presence of Derek Jack, MD.  I, Derek Jack MD, have reviewed the above documentation for accuracy and completeness, and I agree with the above.   Derek Jack, MD [place] [phone]

## 2022-07-02 NOTE — Progress Notes (Signed)
Patients port flushed without difficulty.  Good blood return noted with no bruising or swelling noted at site.  Stable during access and blood draw.  Patient to remain accessed for treatment. 

## 2022-07-12 ENCOUNTER — Encounter (HOSPITAL_COMMUNITY): Payer: Self-pay | Admitting: Hematology

## 2022-07-12 ENCOUNTER — Encounter: Payer: Self-pay | Admitting: Hematology

## 2022-07-23 ENCOUNTER — Other Ambulatory Visit: Payer: Self-pay

## 2022-07-23 ENCOUNTER — Inpatient Hospital Stay: Payer: Medicare PPO

## 2022-07-23 ENCOUNTER — Inpatient Hospital Stay: Payer: Medicare PPO | Admitting: Hematology

## 2022-07-23 ENCOUNTER — Encounter: Payer: Self-pay | Admitting: Hematology

## 2022-07-23 VITALS — BP 144/81 | HR 88 | Temp 97.3°F | Resp 20 | Ht 68.0 in | Wt 233.2 lb

## 2022-07-23 VITALS — BP 115/71 | HR 104 | Temp 97.4°F | Resp 18

## 2022-07-23 DIAGNOSIS — Z95828 Presence of other vascular implants and grafts: Secondary | ICD-10-CM

## 2022-07-23 DIAGNOSIS — C61 Malignant neoplasm of prostate: Secondary | ICD-10-CM

## 2022-07-23 DIAGNOSIS — Z5111 Encounter for antineoplastic chemotherapy: Secondary | ICD-10-CM | POA: Diagnosis not present

## 2022-07-23 DIAGNOSIS — C771 Secondary and unspecified malignant neoplasm of intrathoracic lymph nodes: Secondary | ICD-10-CM

## 2022-07-23 LAB — COMPREHENSIVE METABOLIC PANEL
ALT: 25 U/L (ref 0–44)
AST: 31 U/L (ref 15–41)
Albumin: 3.8 g/dL (ref 3.5–5.0)
Alkaline Phosphatase: 58 U/L (ref 38–126)
Anion gap: 9 (ref 5–15)
BUN: 16 mg/dL (ref 8–23)
CO2: 27 mmol/L (ref 22–32)
Calcium: 8.5 mg/dL — ABNORMAL LOW (ref 8.9–10.3)
Chloride: 101 mmol/L (ref 98–111)
Creatinine, Ser: 1.05 mg/dL (ref 0.61–1.24)
GFR, Estimated: >60 mL/min (ref >60)
Glucose, Bld: 119 mg/dL — ABNORMAL HIGH (ref 70–99)
Potassium: 3.6 mmol/L (ref 3.5–5.1)
Sodium: 137 mmol/L (ref 135–145)
Total Bilirubin: 0.6 mg/dL (ref 0.3–1.2)
Total Protein: 6.5 g/dL (ref 6.5–8.1)

## 2022-07-23 LAB — CBC WITH DIFFERENTIAL/PLATELET
Abs Immature Granulocytes: 0.09 10*3/uL — ABNORMAL HIGH (ref 0.00–0.07)
Basophils Absolute: 0.1 10*3/uL (ref 0.0–0.1)
Basophils Relative: 1 %
Eosinophils Absolute: 0.1 10*3/uL (ref 0.0–0.5)
Eosinophils Relative: 0 %
HCT: 35 % — ABNORMAL LOW (ref 39.0–52.0)
Hemoglobin: 11.1 g/dL — ABNORMAL LOW (ref 13.0–17.0)
Immature Granulocytes: 1 %
Lymphocytes Relative: 7 %
Lymphs Abs: 1.1 10*3/uL (ref 0.7–4.0)
MCH: 29.8 pg (ref 26.0–34.0)
MCHC: 31.7 g/dL (ref 30.0–36.0)
MCV: 94.1 fL (ref 80.0–100.0)
Monocytes Absolute: 1.4 10*3/uL — ABNORMAL HIGH (ref 0.1–1.0)
Monocytes Relative: 10 %
Neutro Abs: 11.8 10*3/uL — ABNORMAL HIGH (ref 1.7–7.7)
Neutrophils Relative %: 81 %
Platelets: 371 10*3/uL (ref 150–400)
RBC: 3.72 MIL/uL — ABNORMAL LOW (ref 4.22–5.81)
RDW: 16.7 % — ABNORMAL HIGH (ref 11.5–15.5)
WBC: 14.4 10*3/uL — ABNORMAL HIGH (ref 4.0–10.5)
nRBC: 0 % (ref 0.0–0.2)

## 2022-07-23 LAB — MAGNESIUM: Magnesium: 1.6 mg/dL — ABNORMAL LOW (ref 1.7–2.4)

## 2022-07-23 LAB — PSA: Prostatic Specific Antigen: 11.29 ng/mL — ABNORMAL HIGH (ref 0.00–4.00)

## 2022-07-23 MED ORDER — CETIRIZINE HCL 10 MG/ML IV SOLN
10.0000 mg | Freq: Once | INTRAVENOUS | Status: AC
  Administered 2022-07-23: 10 mg via INTRAVENOUS
  Filled 2022-07-23: qty 1

## 2022-07-23 MED ORDER — FAMOTIDINE IN NACL 20-0.9 MG/50ML-% IV SOLN
20.0000 mg | Freq: Once | INTRAVENOUS | Status: AC
  Administered 2022-07-23: 20 mg via INTRAVENOUS
  Filled 2022-07-23: qty 50

## 2022-07-23 MED ORDER — SODIUM CHLORIDE 0.9 % IV SOLN: Freq: Once | INTRAVENOUS | Status: AC

## 2022-07-23 MED ORDER — SODIUM CHLORIDE 0.9 % IV SOLN
20.0000 mg/m2 | Freq: Once | INTRAVENOUS | Status: AC
  Administered 2022-07-23: 45 mg via INTRAVENOUS
  Filled 2022-07-23: qty 4.5

## 2022-07-23 MED ORDER — SODIUM CHLORIDE 0.9 % IV SOLN
10.0000 mg | Freq: Once | INTRAVENOUS | Status: AC
  Administered 2022-07-23: 10 mg via INTRAVENOUS
  Filled 2022-07-23: qty 10

## 2022-07-23 MED ORDER — SODIUM CHLORIDE 0.9% FLUSH
10.0000 mL | INTRAVENOUS | Status: DC | PRN
  Administered 2022-07-23: 10 mL

## 2022-07-23 MED ORDER — ONDANSETRON HCL 4 MG/2ML IJ SOLN
8.0000 mg | Freq: Once | INTRAMUSCULAR | Status: AC
  Administered 2022-07-23: 8 mg via INTRAVENOUS
  Filled 2022-07-23: qty 4

## 2022-07-23 MED ORDER — HEPARIN SOD (PORK) LOCK FLUSH 100 UNIT/ML IV SOLN
500.0000 [IU] | Freq: Once | INTRAVENOUS | Status: AC | PRN
  Administered 2022-07-23: 500 [IU]

## 2022-07-23 MED ORDER — SODIUM CHLORIDE 0.9% FLUSH
10.0000 mL | Freq: Once | INTRAVENOUS | Status: AC
  Administered 2022-07-23: 10 mL via INTRAVENOUS

## 2022-07-23 MED ORDER — PEGFILGRASTIM 6 MG/0.6ML ~~LOC~~ PSKT
6.0000 mg | PREFILLED_SYRINGE | Freq: Once | SUBCUTANEOUS | Status: AC
  Administered 2022-07-23: 6 mg via SUBCUTANEOUS
  Filled 2022-07-23: qty 0.6

## 2022-07-23 NOTE — Patient Instructions (Signed)
[facility]  Discharge Instructions: Thank you for choosing [facility] to provide your oncology and hematology care.  If you have a lab appointment with the [facility], please come in thru the Main Entrance and check in at the main information desk.  Wear comfortable clothing and clothing appropriate for easy access to any Portacath or PICC line.   We strive to give you quality time with your provider. You may need to reschedule your appointment if you arrive late (15 or more minutes).  Arriving late affects you and other patients whose appointments are after yours.  Also, if you miss three or more appointments without notifying the office, you may be dismissed from the clinic at the provider's discretion.      For prescription refill requests, have your pharmacy contact our office and allow 72 hours for refills to be completed.    Today you received the following chemotherapy and/or immunotherapy agents, jevtana and onpro   To help prevent nausea and vomiting after your treatment, we encourage you to take your nausea medication as directed.  BELOW ARE SYMPTOMS THAT SHOULD BE REPORTED IMMEDIATELY: *FEVER GREATER THAN 100.4 F (38 C) OR HIGHER *CHILLS OR SWEATING *NAUSEA AND VOMITING THAT IS NOT CONTROLLED WITH YOUR NAUSEA MEDICATION *UNUSUAL SHORTNESS OF BREATH *UNUSUAL BRUISING OR BLEEDING *URINARY PROBLEMS (pain or burning when urinating, or frequent urination) *BOWEL PROBLEMS (unusual diarrhea, constipation, pain near the anus) TENDERNESS IN MOUTH AND THROAT WITH OR WITHOUT PRESENCE OF ULCERS (sore throat, sores in mouth, or a toothache) UNUSUAL RASH, SWELLING OR PAIN  UNUSUAL VAGINAL DISCHARGE OR ITCHING   Items with * indicate a potential emergency and should be followed up as soon as possible or go to the Emergency Department if any problems should occur.  Please show the CHEMOTHERAPY ALERT CARD or IMMUNOTHERAPY ALERT CARD at check-in to the  Emergency Department and triage nurse.  Should you have questions after your visit or need to cancel or reschedule your appointment, please contact [facility] [phone]  and follow the prompts.  Office hours are 8:00 a.m. to 4:30 p.m. Monday - Friday. Please note that voicemails left after 4:00 p.m. may not be returned until the following business day.  We are closed weekends and major holidays. You have access to a nurse at all times for urgent questions. Please call the main number to the clinic [phone] and follow the prompts.  For any non-urgent questions, you may also contact your provider using MyChart. We now offer e-Visits for anyone 70 and older to request care online for non-urgent symptoms. For details visit [email or web address].   Also download the MyChart app! Go to the app store, search "MyChart", open the app, select [facility], and log in with your MyChart username and password.

## 2022-07-23 NOTE — Progress Notes (Signed)
Patient has been examined by Dr. Delton Coombes. Vital signs and labs have been reviewed by MD - ANC, Creatinine, LFTs, hemoglobin, and platelets are within treatment parameters per M.D. - pt may proceed with treatment.  Primary RN and pharmacy notified.

## 2022-07-23 NOTE — Progress Notes (Signed)
[facility] [address] [place] [address]    Clinic Day:  07/23/2022  Referring physician: Derek Jack, MD  Patient Care Team: [facility], [facility] as PCP - General Andre Jack, MD as Medical Oncologist (Medical Oncology) Tyler Pita, MD as Consulting Physician (Radiation Oncology)   ASSESSMENT & PLAN:   Assessment: 1.  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 -Abiraterone and prednisone from 07/13/2016 through 04/09/2019, discontinued secondary to PSA progression. -Enzalutamide from 04/12/2019 through 11/03/2019 with progression. -Last Lupron on 06/09/2019. -CT CAP on 11/10/2019 shows no findings of active malignancy.  Stable small sclerotic lesions at T4 and T10, nonspecific.  Right hydronephrosis with right proximal hydroureter extending down to a 0.8 x 0.8 x 0.4 cm proximal ureteral stone at L3 vertical level.  Nonobstructive right and possibly left nephrolithiasis. -Bone scan on 11/10/2019 shows right proximal tibial metaphyseal activity from recent trauma.  Degenerative findings in the spine, right sternoclavicular joint, shoulders and right foot.  No evidence of metastatic disease. -Foundation 1 test shows MS-stable.  AR amplification.  Sensitivity is reduced due to sample quality. -Guardant 360 on 12/12/2019 MSI high not detected.  TMB was not evaluable.  No other targetable mutations. -F-18-PYLARIFY PET scan showed intense radiotracer activity in the left supraclavicular and prevascular lymph nodes, measuring 10 mm.  Cluster of small prevascular lymph nodes measures 5-7 mm each with SUV 54.  AP window lymph node measuring 8 mm.  No activity in the prostate gland or abdominal or pelvic lymph nodes.  No skeletal activity. - SBRT to the left supraclavicular lymph node and prevascular mediastinal  lymph node from 08/27/2020 through 09/06/2020, 40 Gray in 5 fractions. - CT CAP (10/22/2021): Done at [facility]: No lymphadenopathy.  Cirrhosis.  Intrahepatic and extrahepatic biliary ductal dilatation. - Bone scan (10/28/2021): New focal abnormal tracer uptake at the inferior aspect of the right scapula. - PSMA PET scan (11/13/2021): Interval development of multifocal tracer avid bone metastasis, only 1 of these lesions is visible on the recent nuclear medicine bone scan dated 10/28/2021.  New tracer avid right axillary, subcarinal, left paratracheal, left retrocrural and upper abdominal lymph nodes. - Cycle 1 of cabazitaxel on 12/15/2021, cycle 2 on 01/06/2022   2.  Androgen deprivation therapy induced bone loss: -Received Zometa every 3 months for a year completed on 10/29/2017.   3.  Genetic testing: -Germline mutation testing was negative.  Plan: 1.  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  He started eating better since he stopped taking Rybelsus. - Reviewed labs today which showed normal LFTs.  CBC was grossly normal.  Last PSA on 07/02/2022 was 8.32. - He will proceed with his next cycle today. - Will follow-up on PSA today.  If it continues to rise, will consider CT CAP and bone scan. Addendum: His PSA today is 11.29, up from 8.32.  Will order CT CAP and bone scan.   2.  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  Continue denosumab every 6 weeks.   3.  Leg swelling: - Continue Bumex 1 and half tablets daily.  Mild swelling stable.   4.  Peripheral neuropathy: - Numbness in the feet has been stable.  5.  Hypomagnesemia: - Continue magnesium once daily.  Magnesium is slightly low at 1.6 today as he  did not take his pills this morning.  Orders Placed This Encounter  Procedures   PSA    Standing Status:   Standing    Number of Occurrences:   10    Standing Expiration Date:    07/23/2023      [name] as a scribe for Andre Jack, MD.,have documented all relevant documentation on the behalf of Andre Jack, MD,as directed by  Andre Jack, MD while in the presence of Andre Jack, MD.   I, Andre Jack MD, have reviewed the above documentation for accuracy and completeness, and I agree with the above.   Andre Jack, MD   2/29/20246:38 PM  CHIEF COMPLAINT:   Diagnosis: metastatic prostate cancer to intrathoracic lymph node    Cancer Staging  No matching staging information was found for the patient.   Prior Therapy: 1. Docetaxel x 14 cycles from 08/11/2015 through 06/12/2016. 2. Abiraterone from 07/13/2016 through 04/09/2019. 3. Enzalutamide from 04/12/2019 to 11/03/2019.    Current Therapy:   Cabazitaxel + Prednisone q21d    HISTORY OF PRESENT ILLNESS:   Oncology History  Prostate cancer metastatic to intrathoracic lymph node ([place])  07/24/2015 Initial Diagnosis   Prostate cancer metastatic to the left supraclavicular and anterior mediastinal lymph nodes   08/21/2015 - 06/12/2016 Chemotherapy   Docetaxel every 3 weeks for 14 cycles, discontinued as PSA has plateaued around 11    07/13/2016 Treatment Plan Change   Zytiga 1000 milligrams daily along with prednisone 5 mg daily, started secondary to fluid overload from docetaxel   10/14/2018 Genetic Testing   Negative genetic testing.  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  The Common Hereditary Gene Panel offered by [facility] includes sequencing and/or deletion duplication testing of the following 48 genes: APC, ATM, AXIN2, BARD1, BMPR1A, BRCA1, BRCA2, BRIP1, CDH1, CDK4, CDKN2A (p14ARF), CDKN2A (p16INK4a), CHEK2, CTNNA1, DICER1, EPCAM (Deletion/duplication testing only), GREM1 (promoter region deletion/duplication testing only), KIT, MEN1, MLH1, MSH2, MSH3, MSH6, MUTYH, NBN, NF1, NHTL1, PALB2, PDGFRA, PMS2, POLD1, POLE, PTEN, RAD50, RAD51C, RAD51D,  RNF43, SDHB, SDHC, SDHD, SMAD4, SMARCA4. STK11, TP53, TSC1, TSC2, and VHL.  The following genes were evaluated for sequence changes only: SDHA and HOXB13 c.251G>A variant only. The report date is Oct 14, 2018.   09/18/2019 Genetic Testing   Foundation One CDx      12/12/2019 Genetic Testing   Guardant 360       12/15/2021 - 01/06/2022 Chemotherapy   Patient is on Treatment Plan : PROSTATE Cabazitaxel + Prednisone q21d     12/15/2021 -  Chemotherapy   Patient is on Treatment Plan : PROSTATE Cabazitaxel (20) D1 + Prednisone D1-21 q21d        INTERVAL HISTORY:   Andre [name] is a 82 y.o. male presenting to clinic today for follow up of metastatic prostate cancer to intrathoracic lymph node . He was last seen by me on 07/02/2022.  Today, he states that he is doing well overall. His appetite level is at 90%. His energy level is at 90%.He was taking recent medication he was put on but stopped due to it making him loose his appetite. He stopped the pill on 07/14/2022. He currently takes x1 magnesium tablet a day. He continues to have numbness at the bottom of his feet. He denies any change with his fluid pills.    PAST MEDICAL HISTORY:   Past Medical History: Past Medical History:  Diagnosis Date   COPD (chronic obstructive pulmonary disease) ([place])    Diabetes mellitus without complication ([place])  Family history of ovarian cancer    Family history of prostate cancer    Hypertension    Prostate cancer [facility])     Surgical History: Past Surgical History:  Procedure Laterality Date   GOLD SEED IMPLANT      Social History: Social History   Socioeconomic History   Marital status: Widowed    Spouse name: Not on file   Number of children: Not on file   Years of education: Not on file   Highest education level: Not on file  Occupational History   Not on file  Tobacco Use   Smoking status: Former    Packs/day: 0.25    Years: 60.00    Total pack years: 15.00    Types: Cigarettes     Quit date: 06/25/2014    Years since quitting: 8.0   Smokeless tobacco: Never   Tobacco comments:    smoked since young age, quit in 2016  Substance and Sexual Activity   Alcohol use: Not Currently   Drug use: No   Sexual activity: Not Currently  Other Topics Concern   Not on file  Social History Narrative   Not on file   Social Determinants of Health   Financial Resource Strain: Low Risk  (05/21/2020)   Overall Financial Resource Strain (CARDIA)    Difficulty of Paying Living Expenses: Not hard at all  Food Insecurity: No Food Insecurity (05/21/2020)   Hunger Vital Sign    Worried About Running Out of Food in the Last Year: Never true    [place] in the Last Year: Never true  Transportation Needs: No Transportation Needs (05/21/2020)   PRAPARE - Hydrologist (Medical): No    Lack of Transportation (Non-Medical): No  Physical Activity: Insufficiently Active (05/21/2020)   Exercise Vital Sign    Days of Exercise per Week: 2 days    Minutes of Exercise per Session: 20 min  Stress: No Stress Concern Present (05/21/2020)   [place]    Feeling of Stress : Not at all  Social Connections: Moderately Isolated (05/21/2020)   Social Connection and Isolation Panel [NHANES]    Frequency of Communication with Friends and Family: More than three times a week    Frequency of Social Gatherings with Friends and Family: More than three times a week    Attends Religious Services: 1 to 4 times per year    Active Member of [employer] or Organizations: No    Attends Archivist Meetings: Never    Marital Status: Widowed  Intimate Partner Violence: Not At Risk (05/21/2020)   Humiliation, Afraid, Rape, and Kick questionnaire    Fear of Current or Ex-Partner: No    Emotionally Abused: No    Physically Abused: No    Sexually Abused: No    Family History: Family History  Problem Relation  Age of Onset   Ovarian cancer Mother 28   Heart attack Father    Prostate cancer Maternal Uncle 63   Cancer Cousin        pat cousin with unknown cancer   Colon cancer Neg Hx    Pancreatic cancer Neg Hx    Breast cancer Neg Hx     Current Medications:  Current Outpatient Medications:    ACCU-CHEK GUIDE test strip, , Disp: , Rfl:    Accu-Chek Softclix Lancets lancets, , Disp: , Rfl:    acetaminophen (TYLENOL) 500 MG tablet, Take  500 mg by mouth every 6 (six) hours as needed for moderate pain., Disp: , Rfl:    albuterol (ACCUNEB) 0.63 MG/3ML nebulizer solution, Take 1 ampule by nebulization every 6 (six) hours as needed for wheezing., Disp: , Rfl:    albuterol (PROVENTIL) (2.5 MG/3ML) 0.083% nebulizer solution, INHALE 3 ML IN NEBULIZER BY MOUTH FOUR TIMES A DAY AS NEEDED, Disp: , Rfl:    amLODipine (NORVASC) 2.5 MG tablet, Take 1 tablet (2.5 mg total) by mouth daily as needed., Disp: 30 tablet, Rfl: 0   aspirin 81 MG EC tablet, Take 1 tablet by mouth daily., Disp: , Rfl:    atorvastatin (LIPITOR) 10 MG tablet, TAKE 1 TABLET BY MOUTH EVERY DAY (Patient taking differently: Take 5 mg by mouth daily.), Disp: 90 tablet, Rfl: 3   azelastine (ASTELIN) 0.1 % nasal spray, USE 2 SPRAYS IN EACH NOSTRIL TWICE A DAY FOR ALLERGIC RHINITIS, Disp: , Rfl:    Blood Glucose Monitoring Suppl (ACCU-CHEK GUIDE) w/Device KIT, , Disp: , Rfl:    brompheniramine-pseudoephedrine-DM 30-2-10 MG/5ML syrup, TAKE 5 ML BY MOUTH EVERY 4 HOURS AS NEEDED FOR COUGH, Disp: 120 mL, Rfl: 0   bumetanide (BUMEX) 1 MG tablet, TAKE 1 AND 1/2 TABLETS(1.5 MG) BY MOUTH DAILY, Disp: 45 tablet, Rfl: 6   CALCIUM PO, Take 600 mg by mouth daily. , Disp: , Rfl:    cetirizine (ZYRTEC) 10 MG tablet, Take 1 tablet by mouth daily., Disp: , Rfl:    CHERATUSSIN AC 100-10 MG/5ML syrup, Take 5 mLs by mouth daily as needed. , Disp: , Rfl: 0   Cholecalciferol (VITAMIN D3) 50 MCG (2000 UT) capsule, Take 2,000 Units by mouth daily., Disp: , Rfl:     cyclobenzaprine (FLEXERIL) 10 MG tablet, Take 10 mg by mouth as needed. , Disp: , Rfl: 0   dicyclomine (BENTYL) 20 MG tablet, Take 1 tablet by mouth 3 (three) times daily as needed., Disp: , Rfl:    dorzolamide (TRUSOPT) 2 % ophthalmic solution, Place 1 drop into both eyes 3 (three) times daily., Disp: , Rfl:    doxycycline (VIBRAMYCIN) 100 MG capsule, Take 100 mg by mouth 2 (two) times daily., Disp: , Rfl:    ferrous sulfate 325 (65 FE) MG tablet, Take 1 tablet by mouth daily., Disp: , Rfl:    fexofenadine (ALLEGRA) 180 MG tablet, Take 1 tablet every day by oral route at bedtime for 90 days., Disp: , Rfl:    fluticasone (FLONASE) 50 MCG/ACT nasal spray, USE 2 SPRAY(S) IN EACH NOSTRIL ONCE DAILY FOR 30 DAYS, Disp: 15.8 mL, Rfl: 12   guaifenesin (HUMIBID E) 400 MG TABS tablet, Take 1 tablet by mouth 4 (four) times daily as needed., Disp: , Rfl:    ibuprofen (ADVIL,MOTRIN) 600 MG tablet, Take 600 mg by mouth every 6 (six) hours as needed. , Disp: , Rfl: 0   Ipratropium-Albuterol (COMBIVENT) 20-100 MCG/ACT AERS respimat, INHALE 1 PUFF BY MOUTH FOUR TIMES A DAY AS NEEDED COPD, Disp: , Rfl:    latanoprost (XALATAN) 0.005 % ophthalmic solution, INSTILL 1 DROP INTO AFFECTED EYE(S) BY OPHTHALMIC ROUTE ONCE DAILY INTHE EVENING, Disp: , Rfl:    Latanoprostene Bunod (VYZULTA) 0.024 % SOLN, Apply to eye., Disp: , Rfl:    Leuprolide Acetate, 4 Month, (ELIGARD) 30 MG injection, 30 MG SUBCUTANEOUSLY Q90D PRN, Disp: , Rfl:    losartan (COZAAR) 100 MG tablet, Take 1 tablet by mouth daily., Disp: , Rfl:    magnesium oxide (MAG-OX) 400 MG tablet, Take 1 tablet (  400 mg total) by mouth daily. (Patient taking differently: Take 400 mg by mouth 2 (two) times daily.), Disp: 90 tablet, Rfl: 2   metFORMIN (GLUCOPHAGE) 500 MG tablet, TAKE 2 TABLETS(1000 MG) BY MOUTH TWICE DAILY WITH A MEAL, Disp: 120 tablet, Rfl: 3   montelukast (SINGULAIR) 10 MG tablet, Take 10 mg by mouth at bedtime., Disp: , Rfl:    Multiple  Vitamins-Minerals (PRESERVISION AREDS PO), 1 tablet daily., Disp: , Rfl:    potassium chloride (MICRO-K) 10 MEQ CR capsule, TAKE 4 CAPSULES BY MOUTH WITH FOOD. OPEN AND SPRINKLE ON FOOD, Disp: 120 capsule, Rfl: 3   predniSONE (DELTASONE) 5 MG tablet, Take 1 tablet (5 mg total) by mouth daily with breakfast., Disp: 30 tablet, Rfl: 3   prochlorperazine (COMPAZINE) 10 MG tablet, Take 1 tablet (10 mg total) by mouth every 6 (six) hours as needed for nausea or vomiting., Disp: 60 tablet, Rfl: 3   sucralfate (CARAFATE) 1 g tablet, Take 1 tablet by mouth 4 (four) times daily as needed., Disp: , Rfl:    TRELEGY ELLIPTA 100-62.5-25 MCG/INH AEPB, INHALE 1 PUFF ONCE DAILY, Disp: , Rfl:    vitamin C (ASCORBIC ACID) 500 MG tablet, Take 500 mg by mouth daily., Disp: , Rfl:    Semaglutide (RYBELSUS) 3 MG TABS, take 1 tablet po qam; take w/ <4 oz water, 26mn before 1st food/drink/med; do not cut/crush/chew tab; then start Rybelsus 7 mg tablets after 30 days (Patient not taking: Reported on 07/23/2022), Disp: , Rfl:  No current facility-administered medications for this visit.  Facility-Administered Medications Ordered in Other Visits:    denosumab (XGEVA) 120 MG/1.7ML injection, , , ,    diphenhydrAMINE (BENADRYL) 50 MG/ML injection, , , ,    famotidine (PEPCID) 20-0.9 MG/50ML-% IVPB, , , ,    sodium chloride flush (NS) 0.9 % injection 10 mL, 10 mL, Intracatheter, PRN, KDerek Jack MD, 10 mL at 07/23/22 1601   Allergies: Allergies  Allergen Reactions   Lisinopril Cough   Metoprolol Other (See Comments) and Cough    Increases frequency of cough Other reaction(s): Other (See Comments) Increases frequency of cough    REVIEW OF SYSTEMS:   Review of Systems  Constitutional:  Negative for chills, fatigue and fever.  HENT:   Negative for lump/mass, mouth sores, nosebleeds, sore throat and trouble swallowing.   Eyes:  Negative for eye problems.  Respiratory:  Negative for cough and shortness of  breath.   Cardiovascular:  Negative for chest pain, leg swelling and palpitations.  Gastrointestinal:  Negative for abdominal pain, constipation, diarrhea, nausea and vomiting.  Genitourinary:  Negative for bladder incontinence, difficulty urinating, dysuria, frequency, hematuria and nocturia.   Musculoskeletal:  Negative for arthralgias, back pain, flank pain, myalgias and neck pain.  Skin:  Negative for itching and rash.  Neurological:  Positive for numbness (Fingers and Feet). Negative for dizziness and headaches.  Hematological:  Does not bruise/bleed easily.  Psychiatric/Behavioral:  Negative for depression, sleep disturbance and suicidal ideas. The patient is not nervous/anxious.   All other systems reviewed and are negative.    VITALS:   There were no vitals taken for this visit.  Wt Readings from Last 3 Encounters:  07/23/22 233 lb 3.2 oz (105.8 kg)  07/02/22 242 lb 3.2 oz (109.9 kg)  06/11/22 241 lb 3.2 oz (109.4 kg)    There is no height or weight on file to calculate BMI.  Performance status (ECOG): 1 - Symptomatic but completely ambulatory  PHYSICAL EXAM:  Physical Exam Vitals and nursing note reviewed. Exam conducted with a chaperone present.  Constitutional:      Appearance: Normal appearance.  Cardiovascular:     Rate and Rhythm: Normal rate and regular rhythm.     Pulses: Normal pulses.     Heart sounds: Normal heart sounds.  Pulmonary:     Effort: Pulmonary effort is normal.     Breath sounds: Normal breath sounds.  Abdominal:     Palpations: Abdomen is soft. There is no hepatomegaly, splenomegaly or mass.     Tenderness: There is no abdominal tenderness.  Musculoskeletal:     Right lower leg: No edema.     Left lower leg: No edema.  Lymphadenopathy:     Cervical: No cervical adenopathy.     Right cervical: No superficial, deep or posterior cervical adenopathy.    Left cervical: No superficial, deep or posterior cervical adenopathy.     Upper Body:      Right upper body: No supraclavicular or axillary adenopathy.     Left upper body: No supraclavicular or axillary adenopathy.  Neurological:     General: No focal deficit present.     Mental Status: He is alert and oriented to person, place, and time.  Psychiatric:        Mood and Affect: Mood normal.        Behavior: Behavior normal.     LABS:      Latest Ref Rng & Units 07/23/2022   11:28 AM 07/02/2022    8:56 AM 06/11/2022    8:20 AM  CBC  WBC 4.0 - 10.5 K/uL 14.4  14.1  13.9   Hemoglobin 13.0 - 17.0 g/dL 11.1  10.8  11.2   Hematocrit 39.0 - 52.0 % 35.0  34.0  35.5   Platelets 150 - 400 K/uL 371  262  344       Latest Ref Rng & Units 07/23/2022   11:28 AM 07/02/2022    8:56 AM 06/11/2022    8:20 AM  CMP  Glucose 70 - 99 mg/dL 119  157  152   BUN 8 - 23 mg/dL '16  15  17   '$ Creatinine 0.61 - 1.24 mg/dL 1.05  0.92  0.94   Sodium 135 - 145 mmol/L 137  140  141   Potassium 3.5 - 5.1 mmol/L 3.6  3.4  3.9   Chloride 98 - 111 mmol/L 101  102  103   CO2 22 - 32 mmol/L '27  28  28   '$ Calcium 8.9 - 10.3 mg/dL 8.5  8.3  9.0   Total Protein 6.5 - 8.1 g/dL 6.5  6.2  6.6   Total Bilirubin 0.3 - 1.2 mg/dL 0.6  0.9  0.9   Alkaline Phos 38 - 126 U/L 58  57  63   AST 15 - 41 U/L '31  21  21   '$ ALT 0 - 44 U/L '25  15  16      '$ No results found for: "CEA1", "CEA" / No results found for: "CEA1", "CEA" No results found for: "PSA1" No results found for: "[id number]" No results found for: "CAN125"  No results found for: "TOTALPROTELP", "ALBUMINELP", "A1GS", "A2GS", "BETS", "BETA2SER", "GAMS", "MSPIKE", "SPEI" Lab Results  Component Value Date   TIBC 323 05/20/2022   TIBC 325 12/15/2021   TIBC 268 10/15/2021   FERRITIN 133 05/20/2022   FERRITIN 117 12/15/2021   FERRITIN 330 10/15/2021   IRONPCTSAT 12 (L) 05/20/2022   IRONPCTSAT 10 (L)  12/15/2021   IRONPCTSAT 23 10/15/2021   No results found for: "LDH"   STUDIES:   No results found.

## 2022-07-23 NOTE — Progress Notes (Signed)
Patient presents today for Jevtana infusion and On-pro injection per providers order.  Vital signs and labs reviewed by the MD.  Message received from Anastasio Champion RN/Dr. Delton Coombes patient okay for treatment.  Treatment given today per MD orders.  Stable during infusion without adverse affects.  On-pro placed and light flashing green.  Vital signs stable.  No complaints at this time.  Discharge from clinic ambulatory in stable condition.  Alert and oriented X 3.  Follow up with [facility] as scheduled.

## 2022-07-23 NOTE — Progress Notes (Signed)
Treatment given per orders. Patient tolerated it well without problems. Vitals stable and discharged home from clinic via wheelchair. Follow up as scheduled.  Onpro device administered today per orders. Patient instructed to remove injector around 7:15 pm tomorrow evening.

## 2022-07-23 NOTE — Patient Instructions (Addendum)
[place] at [facility] Discharge Instructions   You were seen and examined today by Dr. Delton Coombes.  He reviewed the results of your lab work. Your PSA has gone up slightly from 8.13 to 8.32. We will repeat a CT scan to investigate this further. All other lab results were normal/stable.   We will proceed with your treatment today.   Return as scheduled.    Thank you for choosing [place] at [facility] to provide your oncology and hematology care.  To afford each patient quality time with our provider, please arrive at least 15 minutes before your scheduled appointment time.   If you have a lab appointment with the [facility] please come in thru the Main Entrance and check in at the main information desk.  You need to re-schedule your appointment should you arrive 10 or more minutes late.  We strive to give you quality time with our providers, and arriving late affects you and other patients whose appointments are after yours.  Also, if you no show three or more times for appointments you may be dismissed from the clinic at the providers discretion.     Again, thank you for choosing [facility].  Our hope is that these requests will decrease the amount of time that you wait before being seen by our physicians.       _____________________________________________________________  Should you have questions after your visit to [facility], please contact our office at [phone] and follow the prompts.  Our office hours are 8:00 a.m. and 4:30 p.m. Monday - Friday.  Please note that voicemails left after 4:00 p.m. may not be returned until the following business day.  We are closed weekends and major holidays.  You do have access to a nurse 24-7, just call the main number to the clinic [phone] and do not press any options, hold on the line and a nurse will answer the phone.    For prescription refill requests,  have your pharmacy contact our office and allow 72 hours.    Due to Covid, you will need to wear a mask upon entering the hospital. If you do not have a mask, a mask will be given to you at the Main Entrance upon arrival. For doctor visits, patients may have 1 support person age 39 or older with them. For treatment visits, patients can not have anyone with them due to social distancing guidelines and our immunocompromised population.

## 2022-07-29 ENCOUNTER — Telehealth: Payer: Self-pay | Admitting: *Deleted

## 2022-07-29 NOTE — Telephone Encounter (Signed)
Daughter [name] called to advise that bp's have been running one teens over 48's for over a week.  States that he is not at all symptomatic.  Only change is that appetite is not what it was since starting on Rybelsus.  Denies taking Amlodipine, as he has been instructed to take that only if BP >150.  Takes losartan 100 mg daily.  Since he is not symptomatic, will hold losartan for toady and see if that makes a difference.  Advised she call PCP to see if medication needs to be adjusted.  Encouraged to increase fluid intake and call us if he develops any symptoms.  Verbalized understanding.

## 2022-08-05 ENCOUNTER — Emergency Department (HOSPITAL_COMMUNITY): Payer: Medicare PPO

## 2022-08-05 ENCOUNTER — Encounter (HOSPITAL_COMMUNITY): Payer: Self-pay

## 2022-08-05 ENCOUNTER — Other Ambulatory Visit: Payer: Self-pay

## 2022-08-05 ENCOUNTER — Emergency Department (HOSPITAL_COMMUNITY)
Admission: EM | Admit: 2022-08-05 | Discharge: 2022-08-05 | Disposition: A | Discharge status: Home / Self Care | Payer: Medicare PPO | Attending: Emergency Medicine | Admitting: Emergency Medicine

## 2022-08-05 DIAGNOSIS — J069 Acute upper respiratory infection, unspecified: Secondary | ICD-10-CM

## 2022-08-05 DIAGNOSIS — Z79899 Other long term (current) drug therapy: Secondary | ICD-10-CM | POA: Insufficient documentation

## 2022-08-05 DIAGNOSIS — Z7984 Long term (current) use of oral hypoglycemic drugs: Secondary | ICD-10-CM | POA: Diagnosis not present

## 2022-08-05 DIAGNOSIS — J449 Chronic obstructive pulmonary disease, unspecified: Secondary | ICD-10-CM | POA: Insufficient documentation

## 2022-08-05 DIAGNOSIS — R059 Cough, unspecified: Secondary | ICD-10-CM | POA: Diagnosis present

## 2022-08-05 DIAGNOSIS — Z7951 Long term (current) use of inhaled steroids: Secondary | ICD-10-CM | POA: Insufficient documentation

## 2022-08-05 DIAGNOSIS — Z20822 Contact with and (suspected) exposure to covid-19: Secondary | ICD-10-CM | POA: Insufficient documentation

## 2022-08-05 DIAGNOSIS — R051 Acute cough: Secondary | ICD-10-CM

## 2022-08-05 DIAGNOSIS — I10 Essential (primary) hypertension: Secondary | ICD-10-CM | POA: Diagnosis not present

## 2022-08-05 DIAGNOSIS — Z7952 Long term (current) use of systemic steroids: Secondary | ICD-10-CM | POA: Insufficient documentation

## 2022-08-05 DIAGNOSIS — Z7982 Long term (current) use of aspirin: Secondary | ICD-10-CM | POA: Insufficient documentation

## 2022-08-05 DIAGNOSIS — Z8546 Personal history of malignant neoplasm of prostate: Secondary | ICD-10-CM | POA: Diagnosis not present

## 2022-08-05 DIAGNOSIS — E119 Type 2 diabetes mellitus without complications: Secondary | ICD-10-CM | POA: Insufficient documentation

## 2022-08-05 LAB — CBC WITH DIFFERENTIAL/PLATELET
Abs Immature Granulocytes: 0.31 10*3/uL — ABNORMAL HIGH (ref 0.00–0.07)
Basophils Absolute: 0.2 10*3/uL — ABNORMAL HIGH (ref 0.0–0.1)
Basophils Relative: 1 %
Eosinophils Absolute: 0 10*3/uL (ref 0.0–0.5)
Eosinophils Relative: 0 %
HCT: 36.5 % — ABNORMAL LOW (ref 39.0–52.0)
Hemoglobin: 11.4 g/dL — ABNORMAL LOW (ref 13.0–17.0)
Immature Granulocytes: 2 %
Lymphocytes Relative: 6 %
Lymphs Abs: 1.2 10*3/uL (ref 0.7–4.0)
MCH: 30.2 pg (ref 26.0–34.0)
MCHC: 31.2 g/dL (ref 30.0–36.0)
MCV: 96.8 fL (ref 80.0–100.0)
Monocytes Absolute: 1.4 10*3/uL — ABNORMAL HIGH (ref 0.1–1.0)
Monocytes Relative: 7 %
Neutro Abs: 16.8 10*3/uL — ABNORMAL HIGH (ref 1.7–7.7)
Neutrophils Relative %: 84 %
Platelets: ADEQUATE 10*3/uL (ref 150–400)
RBC: 3.77 MIL/uL — ABNORMAL LOW (ref 4.22–5.81)
RDW: 17 % — ABNORMAL HIGH (ref 11.5–15.5)
Smear Review: ADEQUATE
WBC: 19.9 10*3/uL — ABNORMAL HIGH (ref 4.0–10.5)
nRBC: 0.2 % (ref 0.0–0.2)

## 2022-08-05 LAB — I-STAT CHEM 8, ED
BUN: 15 mg/dL (ref 8–23)
Calcium, Ion: 1.05 mmol/L — ABNORMAL LOW (ref 1.15–1.40)
Chloride: 100 mmol/L (ref 98–111)
Creatinine, Ser: 1.2 mg/dL (ref 0.61–1.24)
Glucose, Bld: 135 mg/dL — ABNORMAL HIGH (ref 70–99)
HCT: 37 % — ABNORMAL LOW (ref 39.0–52.0)
Hemoglobin: 12.6 g/dL — ABNORMAL LOW (ref 13.0–17.0)
Potassium: 4.6 mmol/L (ref 3.5–5.1)
Sodium: 141 mmol/L (ref 135–145)
TCO2: 30 mmol/L (ref 22–32)

## 2022-08-05 LAB — COMPREHENSIVE METABOLIC PANEL
ALT: 15 U/L (ref 0–44)
AST: 18 U/L (ref 15–41)
Albumin: 3.7 g/dL (ref 3.5–5.0)
Alkaline Phosphatase: 89 U/L (ref 38–126)
Anion gap: 12 (ref 5–15)
BUN: 14 mg/dL (ref 8–23)
CO2: 30 mmol/L (ref 22–32)
Calcium: 9 mg/dL (ref 8.9–10.3)
Chloride: 100 mmol/L (ref 98–111)
Creatinine, Ser: 1.13 mg/dL (ref 0.61–1.24)
GFR, Estimated: >60 mL/min (ref >60)
Glucose, Bld: 133 mg/dL — ABNORMAL HIGH (ref 70–99)
Potassium: 3.4 mmol/L — ABNORMAL LOW (ref 3.5–5.1)
Sodium: 142 mmol/L (ref 135–145)
Total Bilirubin: 1 mg/dL (ref 0.3–1.2)
Total Protein: 6.4 g/dL — ABNORMAL LOW (ref 6.5–8.1)

## 2022-08-05 LAB — RESP PANEL BY RT-PCR (RSV, FLU A&B, COVID)  RVPGX2
Influenza A by PCR: NEGATIVE
Influenza B by PCR: NEGATIVE
Resp Syncytial Virus by PCR: NEGATIVE
SARS Coronavirus 2 by RT PCR: NEGATIVE

## 2022-08-05 MED ORDER — IOHEXOL 300 MG/ML  SOLN
75.0000 mL | Freq: Once | INTRAMUSCULAR | Status: AC | PRN
  Administered 2022-08-05: 75 mL via INTRAVENOUS

## 2022-08-05 MED ORDER — GUAIFENESIN-DM 100-10 MG/5ML PO SYRP
5.0000 mL | ORAL_SOLUTION | Freq: Once | ORAL | Status: AC
  Administered 2022-08-05: 5 mL via ORAL
  Filled 2022-08-05: qty 5

## 2022-08-05 MED ORDER — PREDNISONE 20 MG PO TABS: 40.0000 mg | ORAL_TABLET | Freq: Every day | ORAL | 0 refills | Status: AC

## 2022-08-05 MED ORDER — GUAIFENESIN-DM 100-10 MG/5ML PO SYRP: 5.0000 mL | ORAL_SOLUTION | ORAL | 0 refills | Status: DC | PRN

## 2022-08-05 MED ORDER — PREDNISONE 20 MG PO TABS
40.0000 mg | ORAL_TABLET | Freq: Once | ORAL | Status: AC
  Administered 2022-08-05: 40 mg via ORAL
  Filled 2022-08-05: qty 2

## 2022-08-05 NOTE — ED Notes (Signed)
See triage notes. Nad.  

## 2022-08-05 NOTE — ED Provider Notes (Signed)
[facility] Provider Note   CSN: [id number] Arrival date & time: 08/05/22  1202     History  Chief Complaint  Patient presents with   Cough    Andre [name] is a 82 y.o. male with a history including hypertension, type 2 diabetes, COPD, metastatic prostate cancer under the care of Dr. Delton Coombes presenting with a 2-week history of cough which has been productive of a dark sputum.  He was seen at a urgent care center 5 days ago at which time he was negative for COVID and the flu.  He is currently on day 6 of a 10-day course of doxycycline but has continued cough.  He has had no fevers or chills, denies body aches, nausea vomiting, diarrhea.  He has been compliant with his COPD medications including Combivent and Trelegy.  He also has a nebulizer and has been using albuterol every 4 hours as needed over the past several days.  He has had intermittent wheezing but not currently.  Denies chest pain, orthopnea.  The history is provided by the patient.       Home Medications Prior to Admission medications   Medication Sig Start Date End Date Taking? Authorizing Provider  guaiFENesin-dextromethorphan (ROBITUSSIN DM) 100-10 MG/5ML syrup Take 5 mLs by mouth every 4 (four) hours as needed for cough. 08/05/22  Yes Conrad Zajkowski, Almyra Free, PA-C  predniSONE (DELTASONE) 20 MG tablet Take 2 tablets (40 mg total) by mouth daily for 3 days. 08/05/22 08/08/22 Yes IdolAlmyra Free, PA-C  ACCU-CHEK GUIDE test strip  06/12/19   [provider]  Accu-Chek Softclix Lancets lancets  06/12/19   [provider]  acetaminophen (TYLENOL) 500 MG tablet Take 500 mg by mouth every 6 (six) hours as needed for moderate pain.    [provider]  albuterol (ACCUNEB) 0.63 MG/3ML nebulizer solution Take 1 ampule by nebulization every 6 (six) hours as needed for wheezing.    [provider]  albuterol (PROVENTIL) (2.5 MG/3ML) 0.083% nebulizer solution INHALE 3 ML  IN NEBULIZER BY MOUTH FOUR TIMES A DAY AS NEEDED 02/27/21   [provider]  amLODipine (NORVASC) 2.5 MG tablet Take 1 tablet (2.5 mg total) by mouth daily as needed. 05/04/19   Derek Jack, MD  aspirin 81 MG EC tablet Take 1 tablet by mouth daily. 02/27/21   [provider]  atorvastatin (LIPITOR) 10 MG tablet TAKE 1 TABLET BY MOUTH EVERY DAY Patient taking differently: Take 5 mg by mouth daily. 01/27/22   Derek Jack, MD  azelastine (ASTELIN) 0.1 % nasal spray USE 2 SPRAYS IN EACH NOSTRIL TWICE A DAY FOR ALLERGIC RHINITIS 10/07/21   [provider]  Blood Glucose Monitoring Suppl (ACCU-CHEK GUIDE) w/Device KIT  06/12/19   [provider]  brompheniramine-pseudoephedrine-DM 30-2-10 MG/5ML syrup TAKE 5 ML BY MOUTH EVERY 4 HOURS AS NEEDED FOR COUGH 07/02/22   Derek Jack, MD  bumetanide (BUMEX) 1 MG tablet TAKE 1 AND 1/2 TABLETS(1.5 MG) BY MOUTH DAILY 06/23/22   Derek Jack, MD  CALCIUM PO Take 600 mg by mouth daily.     [provider]  cetirizine (ZYRTEC) 10 MG tablet Take 1 tablet by mouth daily. 02/27/21   [provider]  CHERATUSSIN AC 100-10 MG/5ML syrup Take 5 mLs by mouth daily as needed.  08/12/17   [provider]  Cholecalciferol (VITAMIN D3) 50 MCG (2000 UT) capsule Take 2,000 Units by mouth daily. 05/11/22   [provider]  cyclobenzaprine (FLEXERIL)  10 MG tablet Take 10 mg by mouth as needed.  10/12/17   [provider]  dicyclomine (BENTYL) 20 MG tablet Take 1 tablet by mouth 3 (three) times daily as needed. 02/27/21   [provider]  dorzolamide (TRUSOPT) 2 % ophthalmic solution Place 1 drop into both eyes 3 (three) times daily. 06/04/15   [provider]  doxycycline (VIBRAMYCIN) 100 MG capsule Take 100 mg by mouth 2 (two) times daily. 06/04/22   [provider]  ferrous sulfate 325 (65 FE) MG tablet Take 1 tablet by mouth daily. 02/27/21   [provider]  fexofenadine (ALLEGRA) 180 MG tablet Take 1 tablet every day by oral route at bedtime for 90 days. 05/07/22   [provider]  fluticasone (FLONASE) 50 MCG/ACT nasal spray USE 2 SPRAY(S) IN EACH NOSTRIL ONCE DAILY FOR 30 DAYS 08/11/21   Derek Jack, MD  ibuprofen (ADVIL,MOTRIN) 600 MG tablet Take 600 mg by mouth every 6 (six) hours as needed.  10/12/17   [provider]  Ipratropium-Albuterol (COMBIVENT) 20-100 MCG/ACT AERS respimat INHALE 1 PUFF BY MOUTH FOUR TIMES A DAY AS NEEDED COPD 09/23/21   [provider]  latanoprost (XALATAN) 0.005 % ophthalmic solution INSTILL 1 DROP INTO AFFECTED EYE(S) BY OPHTHALMIC ROUTE ONCE DAILY INTHE EVENING    [provider]  Latanoprostene Bunod (VYZULTA) 0.024 % SOLN Apply to eye.    [provider]  Leuprolide Acetate, 4 Month, (ELIGARD) 30 MG injection 30 MG SUBCUTANEOUSLY Q90D PRN 02/27/21   [provider]  losartan (COZAAR) 100 MG tablet Take 1 tablet by mouth daily. 02/27/21   [provider]  magnesium oxide (MAG-OX) 400 MG tablet Take 1 tablet (400 mg total) by mouth daily. Patient taking differently: Take 400 mg by mouth 2 (two) times daily. 03/10/22   Derek Jack, MD  metFORMIN (GLUCOPHAGE) 500 MG tablet TAKE 2 TABLETS(1000 MG) BY MOUTH TWICE DAILY WITH A MEAL 06/08/22   Derek Jack, MD  montelukast (SINGULAIR) 10 MG tablet Take 10 mg by mouth at bedtime.    [provider]  Multiple Vitamins-Minerals (PRESERVISION AREDS PO) 1 tablet daily.    [provider]  potassium chloride (MICRO-K) 10 MEQ CR capsule TAKE 4 CAPSULES BY MOUTH WITH FOOD. OPEN AND SPRINKLE ON FOOD 01/21/21   Derek Jack, MD  predniSONE (DELTASONE) 5 MG tablet Take 1 tablet (5 mg total) by mouth daily with breakfast. 04/28/22   Derek Jack, MD  prochlorperazine (COMPAZINE) 10 MG tablet Take 1 tablet (10 mg total) by mouth every 6 (six) hours as needed  for nausea or vomiting. 12/14/21   Derek Jack, MD  Semaglutide (RYBELSUS) 3 MG TABS take 1 tablet po qam; take w/ <4 oz water, 105mn before 1st food/drink/med; do not cut/crush/chew tab; then start Rybelsus 7 mg tablets after 30 days Patient not taking: Reported on 07/23/2022 05/08/22   [provider]  sucralfate (CARAFATE) 1 g tablet Take 1 tablet by mouth 4 (four) times daily as needed. 02/27/21   [provider]  TDonnal Debar100-62.5-25 MCG/INH AEPB INHALE 1 PUFF ONCE DAILY 09/08/18   [provider]  vitamin C (ASCORBIC ACID) 500 MG tablet Take 500 mg by mouth daily.    [provider]      Allergies    Lisinopril and Metoprolol    Review of Systems   Review of Systems  Constitutional:  Negative for chills and fever.  HENT:  Negative for congestion and sore throat.  Eyes: Negative.   Respiratory:  Positive for cough and wheezing. Negative for chest tightness and shortness of breath.   Cardiovascular:  Negative for chest pain, palpitations and leg swelling.  Gastrointestinal:  Negative for abdominal pain, nausea and vomiting.  Genitourinary: Negative.   Musculoskeletal:  Negative for arthralgias, joint swelling and neck pain.  Skin: Negative.  Negative for rash and wound.  Neurological:  Negative for dizziness, weakness, light-headedness, numbness and headaches.  Psychiatric/Behavioral: Negative.    All other systems reviewed and are negative.   Physical Exam Updated Vital Signs BP 136/71 (BP Location: Left Arm)   Pulse 86   Temp 98.7 F (37.1 C) (Oral)   Resp 16   Ht '5\' 9"'$  (1.753 m)   Wt 105.2 kg   SpO2 92%   BMI 34.26 kg/m  Physical Exam Vitals and nursing note reviewed.  Constitutional:      Appearance: He is well-developed.  HENT:     Head: Normocephalic and atraumatic.  Eyes:     Conjunctiva/sclera: Conjunctivae normal.  Cardiovascular:     Rate and Rhythm: Normal rate and regular rhythm.     Heart sounds: Normal  heart sounds.  Pulmonary:     Effort: Pulmonary effort is normal.     Breath sounds: Normal breath sounds. No wheezing, rhonchi or rales.  Abdominal:     General: Bowel sounds are normal.     Palpations: Abdomen is soft.     Tenderness: There is no abdominal tenderness.  Musculoskeletal:        General: Normal range of motion.     Cervical back: Normal range of motion.  Skin:    General: Skin is warm and dry.  Neurological:     General: No focal deficit present.     Mental Status: He is alert.     ED Results / Procedures / Treatments   Labs (all labs ordered are listed, but only abnormal results are displayed) Labs Reviewed  CBC WITH DIFFERENTIAL/PLATELET - Abnormal; Notable for the following components:      Result Value   WBC 19.9 (*)    RBC 3.77 (*)    Hemoglobin 11.4 (*)    HCT 36.5 (*)    RDW 17.0 (*)    Neutro Abs 16.8 (*)    Monocytes Absolute 1.4 (*)    Basophils Absolute 0.2 (*)    Abs Immature Granulocytes 0.31 (*)    All other components within normal limits  COMPREHENSIVE METABOLIC PANEL - Abnormal; Notable for the following components:   Potassium 3.4 (*)    Glucose, Bld 133 (*)    Total Protein 6.4 (*)    All other components within normal limits  I-STAT CHEM 8, ED - Abnormal; Notable for the following components:   Glucose, Bld 135 (*)    Calcium, Ion 1.05 (*)    Hemoglobin 12.6 (*)    HCT 37.0 (*)    All other components within normal limits  RESP PANEL BY RT-PCR (RSV, FLU A&B, COVID)  RVPGX2  I-STAT CREATININE, ED    EKG None  Radiology CT Chest W Contrast  Result Date: 08/05/2022 CLINICAL DATA:  Cough and pleural effusion. History of metastatic prostate cancer. EXAM: CT CHEST WITH CONTRAST TECHNIQUE: Multidetector CT imaging of the chest was performed during intravenous contrast administration. RADIATION DOSE REDUCTION: This exam was performed according to the departmental dose-optimization program which includes automated exposure control,  adjustment of the mA and/or kV according to patient size and/or use of iterative reconstruction  technique. CONTRAST:  52m OMNIPAQUE IOHEXOL 300 MG/ML  SOLN COMPARISON:  Chest x-ray same day. CT chest abdomen and pelvis 04/13/2022. PET-CT 04/13/2022. 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 Descending aorta is mildly dilated and unchanged measuring up to 3.5 cm. Heart is mildly enlarged. There is no pericardial effusion. There are atherosclerotic calcifications of the aorta. Right chest port catheter tip ends in the SVC. Mediastinum/Nodes: No enlarged mediastinal, hilar, or axillary lymph nodes. Thyroid gland, trachea, and esophagus demonstrate no significant findings. Lungs/Pleura: There is a trace left pleural effusion with minimal atelectasis in the left lung base. There is some stable scarring in the left lung apex. Scattered nodular densities in the right middle lobe measuring up to 4 mm are unchanged. No pneumothorax. Trachea and central airways are patent. Upper Abdomen: No acute abnormality. Left renal cyst is partially imaged measuring up to 3 cm. Musculoskeletal: No axillary adenopathy. No acute fracture. 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 IMPRESSION: 1. Trace left pleural effusion with minimal left basilar atelectasis. 2. Stable right middle lobe pulmonary nodules measuring up to 4 mm. No follow-up needed if patient is low-risk (and has no known or suspected primary neoplasm). Non-contrast chest CT can be considered in 12 months if patient is high-risk. This recommendation follows the consensus statement: Guidelines for Management of Incidental Pulmonary Nodules Detected on CT Images: From the Fleischner Society 2017; Radiology 2017; 284:228-243. 3. Left Bosniak I benign renal cyst measuring 3 cm. No follow-up imaging is recommended. JACR 2018 Feb; 264-273, Management of the Incidental Renal Mass  on CT, Radio[employer] 2021; 814-848, Bosniak Classification of Cystic Renal Masses, Version 2019. Aortic Atherosclerosis (ICD10-I70.0). Electronically Signed   By: ARonney AstersM.D.   On: 08/05/2022 17:51   DG Chest Port 1 View  Result Date: 08/05/2022 CLINICAL DATA:  Short of breath EXAM: PORTABLE CHEST 1 VIEW COMPARISON:  02/18/2022 FINDINGS: Port in the anterior chest wall with tip in distal SVC. Increasing LEFT effusion and basilar atelectasis. RIGHT lung clear. No acute osseous abnormality. IMPRESSION: Increasing LEFT effusion and basilar atelectasis. Electronically Signed   By: SSuzy BouchardM.D.   On: 08/05/2022 13:32    Procedures Procedures    Medications Ordered in ED Medications  guaiFENesin-dextromethorphan (ROBITUSSIN DM) 100-10 MG/5ML syrup 5 mL (has no administration in time range)  predniSONE (DELTASONE) tablet 40 mg (has no administration in time range)  iohexol (OMNIPAQUE) 300 MG/ML solution 75 mL (75 mLs Intravenous Contrast Given 08/05/22 1712)    ED Course/ Medical Decision Making/ A&P                             Medical Decision Making Patient presenting with a 2-week history of productive cough, he is currently on doxycycline for presumptive bronchitis, day 6, presenting with persistent cough and dark sputum production.  Denies hemoptysis.  Denies shortness of breath as well.  He is currently being treated for metastatic prostate cancer undergoing every 6 week chemotherapy treatments.  Denies chest pain, denies shortness of breath.  Occasional wheezing.  No fevers.  Amount and/or Complexity of Data Reviewed Labs: ordered.    Details: She he does have a significant WBC count at 19.9, however it appears at his baseline white count has been in the 14-15 range.  Hemoglobin is 11.4, also stable.  His respiratory panel is negative, c-Met unremarkable. Radiology: ordered.    Details: Chest x-ray suggesting  a left enlarging pleural effusion, therefore we proceeded with CT  imaging.  He has a left trace pleural effusion with minimal basilar atelectasis with stable right middle lobe nodules.  Left benign renal cyst.  Risk OTC drugs. Prescription drug management.           Final Clinical Impression(s) / ED Diagnoses Final diagnoses:  Viral URI with cough  Acute cough    Rx / DC Orders ED Discharge Orders          Ordered    guaiFENesin-dextromethorphan (ROBITUSSIN DM) 100-10 MG/5ML syrup  Every 4 hours PRN        08/05/22 1813    predniSONE (DELTASONE) 20 MG tablet  Daily        08/05/22 1826              Evalee Jefferson, PA-C 08/05/22 1832    Isla Pence, MD 08/10/22 [phone]

## 2022-08-05 NOTE — Discharge Instructions (Signed)
I recommend taking the combination cough medication which should also help you expectorate this congestion easier, but will be important for you to drink lots of fluids while you are taking this medication.  Your CT scan is reassuring today and stable, there are no new findings on your chest CT, there is a hint of fluid like we discussed but this is minimal and should not require any further treatment at this time.  Plan follow-up with your primary provider if your symptoms persist or worsen.  I do encourage you to continue using your nebulizer treatments and finish the antibiotics you are currently taking.

## 2022-08-05 NOTE — ED Triage Notes (Signed)
Pt BIB daughter due to cough x 2 weeks. Was seen at [facility] and tested for covid/flu and both were negative. Was given abx for URI. Cough has continued. Productive at times with dark sputum.   Denies fever, chills, body aches, N/V/D. NAD in triage.

## 2022-08-05 NOTE — ED Notes (Signed)
Water given nad

## 2022-08-13 ENCOUNTER — Ambulatory Visit: Payer: Medicare PPO | Admitting: Hematology

## 2022-08-13 ENCOUNTER — Other Ambulatory Visit: Payer: Medicare PPO

## 2022-08-13 ENCOUNTER — Ambulatory Visit: Payer: Medicare PPO

## 2022-08-14 ENCOUNTER — Ambulatory Visit (HOSPITAL_COMMUNITY)
Admission: RE | Admit: 2022-08-14 | Discharge: 2022-08-14 | Disposition: A | Discharge status: Home / Self Care | Payer: Medicare PPO | Source: Ambulatory Visit | Attending: Hematology | Admitting: Hematology

## 2022-08-14 DIAGNOSIS — C61 Malignant neoplasm of prostate: Secondary | ICD-10-CM | POA: Diagnosis present

## 2022-08-14 DIAGNOSIS — C771 Secondary and unspecified malignant neoplasm of intrathoracic lymph nodes: Secondary | ICD-10-CM | POA: Insufficient documentation

## 2022-08-14 MED ORDER — IOHEXOL 300 MG/ML  SOLN
100.0000 mL | Freq: Once | INTRAMUSCULAR | Status: AC | PRN
  Administered 2022-08-14: 100 mL via INTRAVENOUS

## 2022-08-18 ENCOUNTER — Inpatient Hospital Stay: Payer: Medicare PPO | Attending: Hematology | Admitting: Hematology

## 2022-08-18 ENCOUNTER — Inpatient Hospital Stay: Payer: Medicare PPO

## 2022-08-18 VITALS — BP 151/76 | HR 76 | Temp 96.3°F | Resp 18

## 2022-08-18 DIAGNOSIS — C771 Secondary and unspecified malignant neoplasm of intrathoracic lymph nodes: Secondary | ICD-10-CM | POA: Diagnosis not present

## 2022-08-18 DIAGNOSIS — Z87891 Personal history of nicotine dependence: Secondary | ICD-10-CM | POA: Diagnosis not present

## 2022-08-18 DIAGNOSIS — Z79899 Other long term (current) drug therapy: Secondary | ICD-10-CM | POA: Insufficient documentation

## 2022-08-18 DIAGNOSIS — C778 Secondary and unspecified malignant neoplasm of lymph nodes of multiple regions: Secondary | ICD-10-CM | POA: Diagnosis not present

## 2022-08-18 DIAGNOSIS — C7951 Secondary malignant neoplasm of bone: Secondary | ICD-10-CM | POA: Insufficient documentation

## 2022-08-18 DIAGNOSIS — Z5189 Encounter for other specified aftercare: Secondary | ICD-10-CM | POA: Diagnosis not present

## 2022-08-18 DIAGNOSIS — C61 Malignant neoplasm of prostate: Secondary | ICD-10-CM | POA: Insufficient documentation

## 2022-08-18 DIAGNOSIS — Z982 Presence of cerebrospinal fluid drainage device: Secondary | ICD-10-CM | POA: Diagnosis not present

## 2022-08-18 DIAGNOSIS — G629 Polyneuropathy, unspecified: Secondary | ICD-10-CM | POA: Insufficient documentation

## 2022-08-18 DIAGNOSIS — Z5111 Encounter for antineoplastic chemotherapy: Secondary | ICD-10-CM | POA: Insufficient documentation

## 2022-08-18 LAB — COMPREHENSIVE METABOLIC PANEL
ALT: 20 U/L (ref 0–44)
AST: 18 U/L (ref 15–41)
Albumin: 3.7 g/dL (ref 3.5–5.0)
Alkaline Phosphatase: 47 U/L (ref 38–126)
Anion gap: 11 (ref 5–15)
BUN: 15 mg/dL (ref 8–23)
CO2: 28 mmol/L (ref 22–32)
Calcium: 8.3 mg/dL — ABNORMAL LOW (ref 8.9–10.3)
Chloride: 100 mmol/L (ref 98–111)
Creatinine, Ser: 0.79 mg/dL (ref 0.61–1.24)
GFR, Estimated: >60 mL/min (ref >60)
Glucose, Bld: 121 mg/dL — ABNORMAL HIGH (ref 70–99)
Potassium: 3.2 mmol/L — ABNORMAL LOW (ref 3.5–5.1)
Sodium: 139 mmol/L (ref 135–145)
Total Bilirubin: 0.8 mg/dL (ref 0.3–1.2)
Total Protein: 6.2 g/dL — ABNORMAL LOW (ref 6.5–8.1)

## 2022-08-18 LAB — CBC WITH DIFFERENTIAL/PLATELET
Abs Immature Granulocytes: 0.04 K/uL (ref 0.00–0.07)
Basophils Absolute: 0.1 K/uL (ref 0.0–0.1)
Basophils Relative: 1 %
Eosinophils Absolute: 0.1 K/uL (ref 0.0–0.5)
Eosinophils Relative: 1 %
HCT: 35.3 % — ABNORMAL LOW (ref 39.0–52.0)
Hemoglobin: 11.1 g/dL — ABNORMAL LOW (ref 13.0–17.0)
Immature Granulocytes: 0 %
Lymphocytes Relative: 9 %
Lymphs Abs: 1 K/uL (ref 0.7–4.0)
MCH: 30.2 pg (ref 26.0–34.0)
MCHC: 31.4 g/dL (ref 30.0–36.0)
MCV: 96.2 fL (ref 80.0–100.0)
Monocytes Absolute: 0.9 K/uL (ref 0.1–1.0)
Monocytes Relative: 9 %
Neutro Abs: 8.3 K/uL — ABNORMAL HIGH (ref 1.7–7.7)
Neutrophils Relative %: 80 %
Platelets: 392 K/uL (ref 150–400)
RBC: 3.67 MIL/uL — ABNORMAL LOW (ref 4.22–5.81)
RDW: 15.5 % (ref 11.5–15.5)
WBC: 10.4 K/uL (ref 4.0–10.5)
nRBC: 0 % (ref 0.0–0.2)

## 2022-08-18 LAB — MAGNESIUM: Magnesium: 1.5 mg/dL — ABNORMAL LOW (ref 1.7–2.4)

## 2022-08-18 MED ORDER — PEGFILGRASTIM 6 MG/0.6ML ~~LOC~~ PSKT
6.0000 mg | PREFILLED_SYRINGE | Freq: Once | SUBCUTANEOUS | Status: AC
  Administered 2022-08-18: 6 mg via SUBCUTANEOUS
  Filled 2022-08-18: qty 0.6

## 2022-08-18 MED ORDER — SODIUM CHLORIDE 0.9 % IV SOLN
20.0000 mg/m2 | Freq: Once | INTRAVENOUS | Status: AC
  Administered 2022-08-18: 45 mg via INTRAVENOUS
  Filled 2022-08-18: qty 4.5

## 2022-08-18 MED ORDER — POTASSIUM CHLORIDE CRYS ER 20 MEQ PO TBCR
20.0000 meq | EXTENDED_RELEASE_TABLET | Freq: Once | ORAL | Status: AC
  Administered 2022-08-18: 20 meq via ORAL
  Filled 2022-08-18: qty 1

## 2022-08-18 MED ORDER — HEPARIN SOD (PORK) LOCK FLUSH 100 UNIT/ML IV SOLN
500.0000 [IU] | Freq: Once | INTRAVENOUS | Status: AC | PRN
  Administered 2022-08-18: 500 [IU]

## 2022-08-18 MED ORDER — SODIUM CHLORIDE 0.9 % IV SOLN
10.0000 mg | Freq: Once | INTRAVENOUS | Status: AC
  Administered 2022-08-18: 10 mg via INTRAVENOUS
  Filled 2022-08-18: qty 1

## 2022-08-18 MED ORDER — FAMOTIDINE IN NACL 20-0.9 MG/50ML-% IV SOLN
20.0000 mg | Freq: Once | INTRAVENOUS | Status: AC
  Administered 2022-08-18: 20 mg via INTRAVENOUS
  Filled 2022-08-18: qty 50

## 2022-08-18 MED ORDER — MAGNESIUM SULFATE 2 GM/50ML IV SOLN
2.0000 g | Freq: Once | INTRAVENOUS | Status: AC
  Administered 2022-08-18: 2 g via INTRAVENOUS
  Filled 2022-08-18: qty 50

## 2022-08-18 MED ORDER — SODIUM CHLORIDE 0.9% FLUSH
10.0000 mL | INTRAVENOUS | Status: DC | PRN
  Administered 2022-08-18: 10 mL

## 2022-08-18 MED ORDER — ONDANSETRON HCL 4 MG/2ML IJ SOLN
8.0000 mg | Freq: Once | INTRAMUSCULAR | Status: AC
  Administered 2022-08-18: 8 mg via INTRAVENOUS
  Filled 2022-08-18: qty 4

## 2022-08-18 MED ORDER — SODIUM CHLORIDE 0.9% FLUSH
10.0000 mL | Freq: Once | INTRAVENOUS | Status: AC
  Administered 2022-08-18: 10 mL

## 2022-08-18 MED ORDER — SODIUM CHLORIDE 0.9 % IV SOLN: Freq: Once | INTRAVENOUS | Status: AC

## 2022-08-18 MED ORDER — CETIRIZINE HCL 10 MG/ML IV SOLN
10.0000 mg | Freq: Once | INTRAVENOUS | Status: AC
  Administered 2022-08-18: 10 mg via INTRAVENOUS
  Filled 2022-08-18: qty 1

## 2022-08-18 NOTE — Progress Notes (Signed)
Patient has been examined by Dr. Katragadda. Vital signs and labs have been reviewed by MD - ANC, Creatinine, LFTs, hemoglobin, and platelets are within treatment parameters per M.D. - pt may proceed with treatment.  Primary RN and pharmacy notified.  

## 2022-08-18 NOTE — Progress Notes (Signed)
Reviewed labs at office visit today with MD. Madaline Brilliant to treat per MD, will give potassium and magnesium per MD orders.

## 2022-08-18 NOTE — Patient Instructions (Addendum)
[place] at [facility] Discharge Instructions   You were seen and examined today by Dr. Delton Coombes.  He reviewed the results of your lab work which are mostly normal/stable. Your magnesium is low at 1.5. Your potassium is also slightly low at 3.2.   He reviewed the results of your CT scan which is stable. There is no new evidence of cancer seen on this exam.   We will proceed with your treatment today.   Return as scheduled.    Thank you for choosing [place] at [facility] to provide your oncology and hematology care.  To afford each patient quality time with our provider, please arrive at least 15 minutes before your scheduled appointment time.   If you have a lab appointment with the [facility] please come in thru the Main Entrance and check in at the main information desk.  You need to re-schedule your appointment should you arrive 10 or more minutes late.  We strive to give you quality time with our providers, and arriving late affects you and other patients whose appointments are after yours.  Also, if you no show three or more times for appointments you may be dismissed from the clinic at the providers discretion.     Again, thank you for choosing [facility].  Our hope is that these requests will decrease the amount of time that you wait before being seen by our physicians.       _____________________________________________________________  Should you have questions after your visit to [facility], please contact our office at [phone] and follow the prompts.  Our office hours are 8:00 a.m. and 4:30 p.m. Monday - Friday.  Please note that voicemails left after 4:00 p.m. may not be returned until the following business day.  We are closed weekends and major holidays.  You do have access to a nurse 24-7, just call the main number to the clinic [phone] and do not press any options, hold on the line  and a nurse will answer the phone.    For prescription refill requests, have your pharmacy contact our office and allow 72 hours.    Due to Covid, you will need to wear a mask upon entering the hospital. If you do not have a mask, a mask will be given to you at the Main Entrance upon arrival. For doctor visits, patients may have 1 support person age 72 or older with them. For treatment visits, patients can not have anyone with them due to social distancing guidelines and our immunocompromised population.

## 2022-08-18 NOTE — Progress Notes (Signed)
Treatment given today per MD orders.  Stable during infusion without adverse affects.  Vital signs stable.  Neulasta On-Pro placed on right armand is working properly.  No complaints at this time.  Discharge from clinic ambulatory in stable condition.  Alert and oriented X 3.  Follow up with [facility] as scheduled.

## 2022-08-18 NOTE — Patient Instructions (Signed)
[place]  Discharge Instructions: Thank you for choosing [facility] to provide your oncology and hematology care.  If you have a lab appointment with the [facility], please come in thru the Main Entrance and check in at the main information desk.  Wear comfortable clothing and clothing appropriate for easy access to any Portacath or PICC line.   We strive to give you quality time with your provider. You may need to reschedule your appointment if you arrive late (15 or more minutes).  Arriving late affects you and other patients whose appointments are after yours.  Also, if you miss three or more appointments without notifying the office, you may be dismissed from the clinic at the provider's discretion.      For prescription refill requests, have your pharmacy contact our office and allow 72 hours for refills to be completed.    Today you received the following chemotherapy and/or immunotherapy agents Jevtana      To help prevent nausea and vomiting after your treatment, we encourage you to take your nausea medication as directed.  BELOW ARE SYMPTOMS THAT SHOULD BE REPORTED IMMEDIATELY: *FEVER GREATER THAN 100.4 F (38 C) OR HIGHER *CHILLS OR SWEATING *NAUSEA AND VOMITING THAT IS NOT CONTROLLED WITH YOUR NAUSEA MEDICATION *UNUSUAL SHORTNESS OF BREATH *UNUSUAL BRUISING OR BLEEDING *URINARY PROBLEMS (pain or burning when urinating, or frequent urination) *BOWEL PROBLEMS (unusual diarrhea, constipation, pain near the anus) TENDERNESS IN MOUTH AND THROAT WITH OR WITHOUT PRESENCE OF ULCERS (sore throat, sores in mouth, or a toothache) UNUSUAL RASH, SWELLING OR PAIN  UNUSUAL VAGINAL DISCHARGE OR ITCHING   Items with * indicate a potential emergency and should be followed up as soon as possible or go to the Emergency Department if any problems should occur.  Please show the CHEMOTHERAPY ALERT CARD or IMMUNOTHERAPY ALERT CARD at check-in to the Emergency  Department and triage nurse.  Should you have questions after your visit or need to cancel or reschedule your appointment, please contact [facility] [phone]  and follow the prompts.  Office hours are 8:00 a.m. to 4:30 p.m. Monday - Friday. Please note that voicemails left after 4:00 p.m. may not be returned until the following business day.  We are closed weekends and major holidays. You have access to a nurse at all times for urgent questions. Please call the main number to the clinic [phone] and follow the prompts.  For any non-urgent questions, you may also contact your provider using MyChart. We now offer e-Visits for anyone 62 and older to request care online for non-urgent symptoms. For details visit [email or web address].   Also download the MyChart app! Go to the app store, search "MyChart", open the app, select [facility], and log in with your MyChart username and password.

## 2022-08-18 NOTE — Progress Notes (Signed)
[facility] [address], [place] [address]    Clinic Day:  08/18/2022  Referring physician: [facility]  Patient Care Team: [facility] as PCP - General Derek Jack, MD as Medical Oncologist (Medical Oncology) Tyler Pita, MD as Consulting Physician (Radiation Oncology)   ASSESSMENT & PLAN:   Assessment: 1.  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 -Abiraterone and prednisone from 07/13/2016 through 04/09/2019, discontinued secondary to PSA progression. -Enzalutamide from 04/12/2019 through 11/03/2019 with progression. -Last Lupron on 06/09/2019. -CT CAP on 11/10/2019 shows no findings of active malignancy.  Stable small sclerotic lesions at T4 and T10, nonspecific.  Right hydronephrosis with right proximal hydroureter extending down to a 0.8 x 0.8 x 0.4 cm proximal ureteral stone at L3 vertical level.  Nonobstructive right and possibly left nephrolithiasis. -Bone scan on 11/10/2019 shows right proximal tibial metaphyseal activity from recent trauma.  Degenerative findings in the spine, right sternoclavicular joint, shoulders and right foot.  No evidence of metastatic disease. -Foundation 1 test shows MS-stable.  AR amplification.  Sensitivity is reduced due to sample quality. -Guardant 360 on 12/12/2019 MSI high not detected.  TMB was not evaluable.  No other targetable mutations. -F-18-PYLARIFY PET scan showed intense radiotracer activity in the left supraclavicular and prevascular lymph nodes, measuring 10 mm.  Cluster of small prevascular lymph nodes measures 5-7 mm each with SUV 54.  AP window lymph node measuring 8 mm.  No activity in the prostate gland or abdominal or pelvic lymph nodes.  No skeletal activity. - SBRT to the left supraclavicular lymph node and prevascular mediastinal  lymph node from 08/27/2020 through 09/06/2020, 40 Gray in 5 fractions. - CT CAP (10/22/2021): Done at [facility]: No lymphadenopathy.  Cirrhosis.  Intrahepatic and extrahepatic biliary ductal dilatation. - Bone scan (10/28/2021): New focal abnormal tracer uptake at the inferior aspect of the right scapula. - PSMA PET scan (11/13/2021): Interval development of multifocal tracer avid bone metastasis, only 1 of these lesions is visible on the recent nuclear medicine bone scan dated 10/28/2021.  New tracer avid right axillary, subcarinal, left paratracheal, left retrocrural and upper abdominal lymph nodes. - Cycle 1 of cabazitaxel on 12/15/2021, cycle 2 on 01/06/2022   2.  Androgen deprivation therapy induced bone loss: -Received Zometa every 3 months for a year completed on 10/29/2017.   3.  Genetic testing: -Germline mutation testing was negative.  Plan: 1.  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 - He went to the ER with cough for 2 weeks. - Reviewed CT CAP (08/14/2022): Unchanged sclerotic bone mets including L1 and right scapula.  Retrocrural lymph nodes remain subcentimeter.  No evidence of new metastatic disease in the chest, abdomen or pelvis. - Last PSA has increased to 11.29 from 8.32.  PSA today from pending. - He will proceed with cabazitaxel today at same dose.  Recommend bone scan prior to next visit.   2.  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  Continue Xgeva every 6 weeks.   3.  Leg swelling: - Continue Bumex 1 and half tablets daily.  Mild swelling stable.   4.  Peripheral neuropathy: - Numbness in the feet has been stable.  5.  Hypomagnesemia: - Continue magnesium daily.  Magnesium is low at 1.5.  He will  receive 2 g of IV magnesium.  Orders Placed This Encounter  Procedures   NM Bone Scan Whole Body    Standing Status:   Future    Standing Expiration Date:    08/18/2023    Order Specific Question:   If indicated for the ordered procedure, I authorize the administration of a radiopharmaceutical per Radiology protocol    Answer:   Yes    Order Specific Question:   Preferred imaging location?    Answer:   [place] as a scribe for Derek Jack, MD.,have documented all relevant documentation on the behalf of Derek Jack, MD,as directed by  Derek Jack, MD while in the presence of Derek Jack, MD.  I, Derek Jack MD, have reviewed the above documentation for accuracy and completeness, and I agree with the above.    Derek Jack, MD   3/26/20246:44 PM  CHIEF COMPLAINT:   Diagnosis: metastatic prostate cancer to intrathoracic lymph node    Cancer Staging  No matching staging information was found for the patient.   Prior Therapy: 1. Docetaxel x 14 cycles from 08/11/2015 through 06/12/2016. 2. Abiraterone from 07/13/2016 through 04/09/2019. 3. Enzalutamide from 04/12/2019 to 11/03/2019.   Current Therapy:   Cabazitaxel + Prednisone q21d   HISTORY OF PRESENT ILLNESS:   Oncology History  Prostate cancer metastatic to intrathoracic lymph node ([place])  07/24/2015 Initial Diagnosis   Prostate cancer metastatic to the left supraclavicular and anterior mediastinal lymph nodes   08/21/2015 - 06/12/2016 Chemotherapy   Docetaxel every 3 weeks for 14 cycles, discontinued as PSA has plateaued around 11    07/13/2016 Treatment Plan Change   Zytiga 1000 milligrams daily along with prednisone 5 mg daily, started secondary to fluid overload from docetaxel   10/14/2018 Genetic Testing   Negative genetic testing.  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  The Common Hereditary Gene Panel offered by [facility] includes sequencing and/or deletion duplication testing of the following 48 genes: APC, ATM, AXIN2, BARD1, BMPR1A, BRCA1, BRCA2, BRIP1, CDH1, CDK4, CDKN2A (p14ARF), CDKN2A  (p16INK4a), CHEK2, CTNNA1, DICER1, EPCAM (Deletion/duplication testing only), GREM1 (promoter region deletion/duplication testing only), KIT, MEN1, MLH1, MSH2, MSH3, MSH6, MUTYH, NBN, NF1, NHTL1, PALB2, PDGFRA, PMS2, POLD1, POLE, PTEN, RAD50, RAD51C, RAD51D, RNF43, SDHB, SDHC, SDHD, SMAD4, SMARCA4. STK11, TP53, TSC1, TSC2, and VHL.  The following genes were evaluated for sequence changes only: SDHA and HOXB13 c.251G>A variant only. The report date is Oct 14, 2018.   09/18/2019 Genetic Testing   Foundation One CDx      12/12/2019 Genetic Testing   Guardant 360       12/15/2021 - 01/06/2022 Chemotherapy   Patient is on Treatment Plan : PROSTATE Cabazitaxel + Prednisone q21d     12/15/2021 -  Chemotherapy   Patient is on Treatment Plan : PROSTATE Cabazitaxel (20) D1 + Prednisone D1-21 q21d        INTERVAL HISTORY:   Macaulay is a 82 y.o. male presenting to clinic today for follow up of metastatic prostate cancer to intrathoracic lymph node. He was last seen by me on 07/23/22.  He was seen in the ED on 08/05/22 for treatment of x2 weeks of a productive cough. He had a negative respiratory panel and reassuring CT chest. He was started on Robitussin DM and Prednisone at time of discharge home.   Today, he states that he is doing better in regards to his cough. He is still using the Robitussin.  His appetite level is  at 100%. He denies any nausea or vomiting. His energy level is at 75%. He reports feeling sluggish for x3-4 days after treatment but then he returns to his baseline energy level. He denies having any pain.  He reports unchanged tingling in his feet. He does not have difficulty buttoning his clothes or picking up small objects. He does occasionally have tingling in his hands as well but this is not limiting him. We discussed his CT AP results from 08/05/22. He reports that he is still taking OTC MagOx.   PAST MEDICAL HISTORY:   Past Medical History: Past Medical History:  Diagnosis  Date   COPD (chronic obstructive pulmonary disease) ([place])    Diabetes mellitus without complication (HCC)    Family history of ovarian cancer    Family history of prostate cancer    Hypertension    Prostate cancer [facility])     Surgical History: Past Surgical History:  Procedure Laterality Date   GOLD SEED IMPLANT      Social History: Social History   Socioeconomic History   Marital status: Widowed    Spouse name: Not on file   Number of children: Not on file   Years of education: Not on file   Highest education level: Not on file  Occupational History   Not on file  Tobacco Use   Smoking status: Former    Packs/day: 0.25    Years: 60.00    Additional pack years: 0.00    Total pack years: 15.00    Types: Cigarettes    Quit date: 06/25/2014    Years since quitting: 8.1   Smokeless tobacco: Never   Tobacco comments:    smoked since young age, quit in 2016  Substance and Sexual Activity   Alcohol use: Not Currently   Drug use: No   Sexual activity: Not Currently  Other Topics Concern   Not on file  Social History Narrative   Not on file   Social Determinants of Health   Financial Resource Strain: Low Risk  (05/21/2020)   Overall Financial Resource Strain (CARDIA)    Difficulty of Paying Living Expenses: Not hard at all  Food Insecurity: No Food Insecurity (05/21/2020)   Hunger Vital Sign    Worried About Running Out of Food in the Last Year: Never true    [place] in the Last Year: Never true  Transportation Needs: No Transportation Needs (05/21/2020)   PRAPARE - Hydrologist (Medical): No    Lack of Transportation (Non-Medical): No  Physical Activity: Insufficiently Active (05/21/2020)   Exercise Vital Sign    Days of Exercise per Week: 2 days    Minutes of Exercise per Session: 20 min  Stress: No Stress Concern Present (05/21/2020)   [place]    Feeling of  Stress : Not at all  Social Connections: Moderately Isolated (05/21/2020)   Social Connection and Isolation Panel [NHANES]    Frequency of Communication with Friends and Family: More than three times a week    Frequency of Social Gatherings with Friends and Family: More than three times a week    Attends Religious Services: 1 to 4 times per year    Active Member of [employer] or Organizations: No    Attends Archivist Meetings: Never    Marital Status: Widowed  Intimate Partner Violence: Not At Risk (05/21/2020)   Humiliation, Afraid, Rape, and Kick questionnaire  Fear of Current or Ex-Partner: No    Emotionally Abused: No    Physically Abused: No    Sexually Abused: No    Family History: Family History  Problem Relation Age of Onset   Ovarian cancer Mother 73   Heart attack Father    Prostate cancer Maternal Uncle 71   Cancer Cousin        pat cousin with unknown cancer   Colon cancer Neg Hx    Pancreatic cancer Neg Hx    Breast cancer Neg Hx     Current Medications:  Current Outpatient Medications:    ACCU-CHEK GUIDE test strip, , Disp: , Rfl:    Accu-Chek Softclix Lancets lancets, , Disp: , Rfl:    acetaminophen (TYLENOL) 500 MG tablet, Take 500 mg by mouth every 6 (six) hours as needed for moderate pain., Disp: , Rfl:    albuterol (ACCUNEB) 0.63 MG/3ML nebulizer solution, Take 1 ampule by nebulization every 6 (six) hours as needed for wheezing., Disp: , Rfl:    albuterol (PROVENTIL) (2.5 MG/3ML) 0.083% nebulizer solution, INHALE 3 ML IN NEBULIZER BY MOUTH FOUR TIMES A DAY AS NEEDED, Disp: , Rfl:    amLODipine (NORVASC) 2.5 MG tablet, Take 1 tablet (2.5 mg total) by mouth daily as needed., Disp: 30 tablet, Rfl: 0   aspirin 81 MG EC tablet, Take 1 tablet by mouth daily., Disp: , Rfl:    atorvastatin (LIPITOR) 10 MG tablet, TAKE 1 TABLET BY MOUTH EVERY DAY (Patient taking differently: Take 5 mg by mouth daily.), Disp: 90 tablet, Rfl: 3   azelastine (ASTELIN) 0.1 %  nasal spray, USE 2 SPRAYS IN EACH NOSTRIL TWICE A DAY FOR ALLERGIC RHINITIS, Disp: , Rfl:    Blood Glucose Monitoring Suppl (ACCU-CHEK GUIDE) w/Device KIT, , Disp: , Rfl:    brompheniramine-pseudoephedrine-DM 30-2-10 MG/5ML syrup, TAKE 5 ML BY MOUTH EVERY 4 HOURS AS NEEDED FOR COUGH, Disp: 120 mL, Rfl: 0   bumetanide (BUMEX) 1 MG tablet, TAKE 1 AND 1/2 TABLETS(1.5 MG) BY MOUTH DAILY, Disp: 45 tablet, Rfl: 6   CALCIUM PO, Take 600 mg by mouth daily. , Disp: , Rfl:    cetirizine (ZYRTEC) 10 MG tablet, Take 1 tablet by mouth daily., Disp: , Rfl:    CHERATUSSIN AC 100-10 MG/5ML syrup, Take 5 mLs by mouth daily as needed. , Disp: , Rfl: 0   Cholecalciferol (VITAMIN D3) 50 MCG (2000 UT) capsule, Take 2,000 Units by mouth daily., Disp: , Rfl:    cyclobenzaprine (FLEXERIL) 10 MG tablet, Take 10 mg by mouth as needed. , Disp: , Rfl: 0   dicyclomine (BENTYL) 20 MG tablet, Take 1 tablet by mouth 3 (three) times daily as needed., Disp: , Rfl:    dorzolamide (TRUSOPT) 2 % ophthalmic solution, Place 1 drop into both eyes 3 (three) times daily., Disp: , Rfl:    doxycycline (VIBRAMYCIN) 100 MG capsule, Take 100 mg by mouth 2 (two) times daily., Disp: , Rfl:    ferrous sulfate 325 (65 FE) MG tablet, Take 1 tablet by mouth daily., Disp: , Rfl:    fexofenadine (ALLEGRA) 180 MG tablet, Take 1 tablet every day by oral route at bedtime for 90 days., Disp: , Rfl:    fluticasone (FLONASE) 50 MCG/ACT nasal spray, USE 2 SPRAY(S) IN EACH NOSTRIL ONCE DAILY FOR 30 DAYS, Disp: 15.8 mL, Rfl: 12   guaiFENesin-dextromethorphan (ROBITUSSIN DM) 100-10 MG/5ML syrup, Take 5 mLs by mouth every 4 (four) hours as needed for cough., Disp: 118 mL, Rfl:  0   ibuprofen (ADVIL,MOTRIN) 600 MG tablet, Take 600 mg by mouth every 6 (six) hours as needed. , Disp: , Rfl: 0   Ipratropium-Albuterol (COMBIVENT) 20-100 MCG/ACT AERS respimat, INHALE 1 PUFF BY MOUTH FOUR TIMES A DAY AS NEEDED COPD, Disp: , Rfl:    latanoprost (XALATAN) 0.005 % ophthalmic  solution, INSTILL 1 DROP INTO AFFECTED EYE(S) BY OPHTHALMIC ROUTE ONCE DAILY INTHE EVENING, Disp: , Rfl:    Latanoprostene Bunod (VYZULTA) 0.024 % SOLN, Apply to eye., Disp: , Rfl:    Leuprolide Acetate, 4 Month, (ELIGARD) 30 MG injection, 30 MG SUBCUTANEOUSLY Q90D PRN, Disp: , Rfl:    losartan (COZAAR) 100 MG tablet, Take 1 tablet by mouth daily., Disp: , Rfl:    magnesium oxide (MAG-OX) 400 MG tablet, Take 1 tablet (400 mg total) by mouth daily. (Patient taking differently: Take 400 mg by mouth 2 (two) times daily.), Disp: 90 tablet, Rfl: 2   metFORMIN (GLUCOPHAGE) 500 MG tablet, TAKE 2 TABLETS(1000 MG) BY MOUTH TWICE DAILY WITH A MEAL, Disp: 120 tablet, Rfl: 3   montelukast (SINGULAIR) 10 MG tablet, Take 10 mg by mouth at bedtime., Disp: , Rfl:    Multiple Vitamins-Minerals (PRESERVISION AREDS PO), 1 tablet daily., Disp: , Rfl:    potassium chloride (MICRO-K) 10 MEQ CR capsule, TAKE 4 CAPSULES BY MOUTH WITH FOOD. OPEN AND SPRINKLE ON FOOD, Disp: 120 capsule, Rfl: 3   predniSONE (DELTASONE) 5 MG tablet, Take 1 tablet (5 mg total) by mouth daily with breakfast., Disp: 30 tablet, Rfl: 3   prochlorperazine (COMPAZINE) 10 MG tablet, Take 1 tablet (10 mg total) by mouth every 6 (six) hours as needed for nausea or vomiting., Disp: 60 tablet, Rfl: 3   Semaglutide (RYBELSUS) 3 MG TABS, , Disp: , Rfl:    sucralfate (CARAFATE) 1 g tablet, Take 1 tablet by mouth 4 (four) times daily as needed., Disp: , Rfl:    TRELEGY ELLIPTA 100-62.5-25 MCG/INH AEPB, INHALE 1 PUFF ONCE DAILY, Disp: , Rfl:    vitamin C (ASCORBIC ACID) 500 MG tablet, Take 500 mg by mouth daily., Disp: , Rfl:  No current facility-administered medications for this visit.  Facility-Administered Medications Ordered in Other Visits:    denosumab (XGEVA) 120 MG/1.7ML injection, , , ,    diphenhydrAMINE (BENADRYL) 50 MG/ML injection, , , ,    famotidine (PEPCID) 20-0.9 MG/50ML-% IVPB, , , ,    sodium chloride flush (NS) 0.9 % injection 10 mL, 10  mL, Intracatheter, PRN, Derek Jack, MD, 10 mL at 08/18/22 1504   Allergies: Allergies  Allergen Reactions   Lisinopril Cough   Metoprolol Other (See Comments) and Cough    Increases frequency of cough Other reaction(s): Other (See Comments) Increases frequency of cough    REVIEW OF SYSTEMS:   Review of Systems  Constitutional:  Positive for fatigue. Negative for chills and fever.  HENT:   Negative for lump/mass, mouth sores, nosebleeds, sore throat and trouble swallowing.   Eyes:  Negative for eye problems.  Respiratory:  Positive for cough and shortness of breath (intermittent).   Cardiovascular:  Negative for chest pain, leg swelling and palpitations.  Gastrointestinal:  Negative for abdominal pain, constipation, diarrhea, nausea and vomiting.  Genitourinary:  Negative for bladder incontinence, difficulty urinating, dysuria, frequency, hematuria and nocturia.   Musculoskeletal:  Negative for arthralgias, back pain, flank pain, myalgias and neck pain.  Skin:  Negative for itching and rash.  Neurological:  Positive for dizziness. Negative for headaches and numbness.       +  tingling in hands/feet  Hematological:  Does not bruise/bleed easily.  Psychiatric/Behavioral:  Negative for depression, sleep disturbance and suicidal ideas. The patient is not nervous/anxious.   All other systems reviewed and are negative.    VITALS:   There were no vitals taken for this visit.  Wt Readings from Last 3 Encounters:  08/18/22 234 lb 12.8 oz (106.5 kg)  08/05/22 232 lb (105.2 kg)  07/23/22 233 lb 3.2 oz (105.8 kg)    There is no height or weight on file to calculate BMI.  Performance status (ECOG): 1 - Symptomatic but completely ambulatory  PHYSICAL EXAM:   Physical Exam Vitals and nursing note reviewed. Exam conducted with a chaperone present.  Constitutional:      Appearance: Normal appearance.  Cardiovascular:     Rate and Rhythm: Normal rate and regular rhythm.      Pulses: Normal pulses.     Heart sounds: Normal heart sounds.  Pulmonary:     Effort: Pulmonary effort is normal.     Breath sounds: Normal breath sounds.  Abdominal:     Palpations: Abdomen is soft. There is no hepatomegaly, splenomegaly or mass.     Tenderness: There is no abdominal tenderness.  Musculoskeletal:     Right lower leg: No edema.     Left lower leg: No edema.  Lymphadenopathy:     Cervical: No cervical adenopathy.     Right cervical: No superficial, deep or posterior cervical adenopathy.    Left cervical: No superficial, deep or posterior cervical adenopathy.     Upper Body:     Right upper body: No supraclavicular or axillary adenopathy.     Left upper body: No supraclavicular or axillary adenopathy.  Neurological:     General: No focal deficit present.     Mental Status: He is alert and oriented to person, place, and time.  Psychiatric:        Mood and Affect: Mood normal.        Behavior: Behavior normal.     LABS:      Latest Ref Rng & Units 08/18/2022   10:22 AM 08/05/2022    3:48 PM 08/05/2022    3:43 PM  CBC  WBC 4.0 - 10.5 K/uL 10.4   19.9   Hemoglobin 13.0 - 17.0 g/dL 11.1  12.6  11.4   Hematocrit 39.0 - 52.0 % 35.3  37.0  36.5   Platelets 150 - 400 K/uL 392   PLATELET CLUMPS NOTED ON SMEAR, COUNT APPEARS ADEQUATE       Latest Ref Rng & Units 08/18/2022   10:22 AM 08/05/2022    4:17 PM 08/05/2022    3:48 PM  CMP  Glucose 70 - 99 mg/dL 121  133  135   BUN 8 - 23 mg/dL 15  14  15    Creatinine 0.61 - 1.24 mg/dL 0.79  1.13  1.20   Sodium 135 - 145 mmol/L 139  142  141   Potassium 3.5 - 5.1 mmol/L 3.2  3.4  4.6   Chloride 98 - 111 mmol/L 100  100  100   CO2 22 - 32 mmol/L 28  30    Calcium 8.9 - 10.3 mg/dL 8.3  9.0    Total Protein 6.5 - 8.1 g/dL 6.2  6.4    Total Bilirubin 0.3 - 1.2 mg/dL 0.8  1.0    Alkaline Phos 38 - 126 U/L 47  89    AST 15 - 41 U/L 18  18  ALT 0 - 44 U/L 20  15       No results found for: "CEA1", "CEA" / No results  found for: "CEA1", "CEA" No results found for: "PSA1" No results found for: "[id number]" No results found for: "CAN125"  No results found for: "TOTALPROTELP", "ALBUMINELP", "A1GS", "A2GS", "BETS", "BETA2SER", "GAMS", "MSPIKE", "SPEI" Lab Results  Component Value Date   TIBC 323 05/20/2022   TIBC 325 12/15/2021   TIBC 268 10/15/2021   FERRITIN 133 05/20/2022   FERRITIN 117 12/15/2021   FERRITIN 330 10/15/2021   IRONPCTSAT 12 (L) 05/20/2022   IRONPCTSAT 10 (L) 12/15/2021   IRONPCTSAT 23 10/15/2021   No results found for: "LDH"   STUDIES:   CT CHEST ABDOMEN PELVIS W CONTRAST  Result Date: 08/16/2022 CLINICAL DATA:  Metastatic prostate cancer restaging, ongoing chemotherapy * Tracking Code: BO * EXAM: CT CHEST, ABDOMEN, AND PELVIS WITH CONTRAST TECHNIQUE: Multidetector CT imaging of the chest, abdomen and pelvis was performed following the standard protocol during bolus administration of intravenous contrast. RADIATION DOSE REDUCTION: This exam was performed according to the departmental dose-optimization program which includes automated exposure control, adjustment of the mA and/or kV according to patient size and/or use of iterative reconstruction technique. CONTRAST:  175mL OMNIPAQUE IOHEXOL 300 MG/ML  SOLN COMPARISON:  CT chest, 08/05/2022, CT chest abdomen pelvis, 04/13/2022 FINDINGS: CT CHEST FINDINGS Cardiovascular: Aortic atherosclerosis. Aortic valve and mitral annulus calcifications right chest port catheter. Cardiomegaly. Three-vessel coronary artery calcifications. No pericardial effusion. Mediastinum/Nodes: No enlarged mediastinal, hilar, or axillary lymph nodes. Thyroid gland, trachea, and esophagus demonstrate no significant findings. Lungs/Pleura: Mild centrilobular emphysema. Unchanged fibrotic scarring and volume loss of the paramedian left upper lobe (series 3, image 45) and dependent left lung base (series 3, image 105). No pleural effusion or pneumothorax. Musculoskeletal: No  chest wall abnormality. No acute osseous findings. CT ABDOMEN PELVIS FINDINGS Hepatobiliary: No solid liver abnormality is seen. Benign fluid attenuation cysts scattered throughout the liver, for which no specific follow-up or characterization is required. No gallstones or gallbladder wall thickening. Unchanged mild intra and extrahepatic biliary ductal dilatation, common bile duct measuring up to 1.0 cm. Pancreas: Unremarkable. No pancreatic ductal dilatation or surrounding inflammatory changes. Spleen: Normal in size without significant abnormality. Adrenals/Urinary Tract: Adrenal glands are unremarkable. Nonobstructive bilateral inferior pole renal calculi. Benign bilateral renal cortical and parapelvic cysts, for which no further follow-up or characterization is required. Bladder is unremarkable. Stomach/Bowel: Stomach is within normal limits. Appendix appears normal. No evidence of bowel wall thickening, distention, or inflammatory changes. Descending colonic diverticulosis. Vascular/Lymphatic: Aortic atherosclerosis. No enlarged abdominal or pelvic lymph nodes. Retrocrural lymph nodes remain subcentimeter (series 2, image 58). Reproductive: Prostate brachytherapy. Other: No abdominal wall hernia or abnormality. No ascites. Musculoskeletal: No acute osseous findings. Unchanged sclerotic osseous metastatic lesions, including of L1 (series 5, image 118) and of the right scapula (series 2, image 31). IMPRESSION: 1. Unchanged sclerotic osseous metastatic lesions, including of L1 and of the right scapula. 2. Retrocrural lymph nodes remain subcentimeter. No persistently enlarged lymph nodes in the chest, abdomen, or pelvis. 3. No evidence of new metastatic disease in the chest, abdomen, or pelvis. 4. Prostate brachytherapy. 5. Emphysema.  Unchanged scarring and volume loss of the left lung. 6. Nonobstructive bilateral nephrolithiasis. 7. Unchanged mild intra and extrahepatic biliary ductal dilatation, of doubtful  clinical significance. 8. Coronary artery disease. Aortic Atherosclerosis (ICD10-I70.0) and Emphysema (ICD10-J43.9). Electronically Signed   By: Delanna Ahmadi M.D.   On: 08/16/2022 07:42   CT Chest W  Contrast  Result Date: 08/05/2022 CLINICAL DATA:  Cough and pleural effusion. History of metastatic prostate cancer. EXAM: CT CHEST WITH CONTRAST TECHNIQUE: Multidetector CT imaging of the chest was performed during intravenous contrast administration. RADIATION DOSE REDUCTION: This exam was performed according to the departmental dose-optimization program which includes automated exposure control, adjustment of the mA and/or kV according to patient size and/or use of iterative reconstruction technique. CONTRAST:  20mL OMNIPAQUE IOHEXOL 300 MG/ML  SOLN COMPARISON:  Chest x-ray same day. CT chest abdomen and pelvis 04/13/2022. PET-CT 04/13/2022. 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 Descending aorta is mildly dilated and unchanged measuring up to 3.5 cm. Heart is mildly enlarged. There is no pericardial effusion. There are atherosclerotic calcifications of the aorta. Right chest port catheter tip ends in the SVC. Mediastinum/Nodes: No enlarged mediastinal, hilar, or axillary lymph nodes. Thyroid gland, trachea, and esophagus demonstrate no significant findings. Lungs/Pleura: There is a trace left pleural effusion with minimal atelectasis in the left lung base. There is some stable scarring in the left lung apex. Scattered nodular densities in the right middle lobe measuring up to 4 mm are unchanged. No pneumothorax. Trachea and central airways are patent. Upper Abdomen: No acute abnormality. Left renal cyst is partially imaged measuring up to 3 cm. Musculoskeletal: No axillary adenopathy. No acute fracture. 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 IMPRESSION: 1. Trace left pleural effusion with minimal left  basilar atelectasis. 2. Stable right middle lobe pulmonary nodules measuring up to 4 mm. No follow-up needed if patient is low-risk (and has no known or suspected primary neoplasm). Non-contrast chest CT can be considered in 12 months if patient is high-risk. This recommendation follows the consensus statement: Guidelines for Management of Incidental Pulmonary Nodules Detected on CT Images: From the Fleischner Society 2017; Radiology 2017; 284:228-243. 3. Left Bosniak I benign renal cyst measuring 3 cm. No follow-up imaging is recommended. JACR 2018 Feb; 264-273, Management of the Incidental Renal Mass on CT, [employer] 2021; 814-848, Bosniak Classification of Cystic Renal Masses, Version 2019. Aortic Atherosclerosis (ICD10-I70.0). Electronically Signed   By: Ronney Asters M.D.   On: 08/05/2022 17:51   DG Chest Port 1 View  Result Date: 08/05/2022 CLINICAL DATA:  Short of breath EXAM: PORTABLE CHEST 1 VIEW COMPARISON:  02/18/2022 FINDINGS: Port in the anterior chest wall with tip in distal SVC. Increasing LEFT effusion and basilar atelectasis. RIGHT lung clear. No acute osseous abnormality. IMPRESSION: Increasing LEFT effusion and basilar atelectasis. Electronically Signed   By: Suzy Bouchard M.D.   On: 08/05/2022 13:32

## 2022-08-25 ENCOUNTER — Other Ambulatory Visit: Payer: Self-pay

## 2022-08-28 ENCOUNTER — Other Ambulatory Visit: Payer: Self-pay | Admitting: Hematology

## 2022-09-01 ENCOUNTER — Encounter (HOSPITAL_COMMUNITY)
Admission: RE | Admit: 2022-09-01 | Discharge: 2022-09-01 | Disposition: A | Discharge status: Home / Self Care | Payer: Medicare PPO | Source: Ambulatory Visit | Attending: Hematology | Admitting: Hematology

## 2022-09-01 DIAGNOSIS — C771 Secondary and unspecified malignant neoplasm of intrathoracic lymph nodes: Secondary | ICD-10-CM | POA: Diagnosis present

## 2022-09-01 DIAGNOSIS — C61 Malignant neoplasm of prostate: Secondary | ICD-10-CM

## 2022-09-01 MED ORDER — TECHNETIUM TC 99M MEDRONATE IV KIT
20.0000 | PACK | Freq: Once | INTRAVENOUS | Status: AC | PRN
  Administered 2022-09-01: 20 via INTRAVENOUS

## 2022-09-07 NOTE — Progress Notes (Signed)
[facility] [address], [place] [address]    Clinic Day:  09/08/2022  Referring physician: Clinic, [name]  Patient Care Team: Clinic, [name] as PCP - General [name], MD as Medical Oncologist (Medical Oncology) Margaretmary Dys, MD as Consulting Physician (Radiation Oncology)   ASSESSMENT & PLAN:   Assessment: 1.  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 -Abiraterone and prednisone from 07/13/2016 through 04/09/2019, discontinued secondary to PSA progression. -Enzalutamide from 04/12/2019 through 11/03/2019 with progression. -Last Lupron on 06/09/2019. -CT CAP on 11/10/2019 shows no findings of active malignancy.  Stable small sclerotic lesions at T4 and T10, nonspecific.  Right hydronephrosis with right proximal hydroureter extending down to a 0.8 x 0.8 x 0.4 cm proximal ureteral stone at L3 vertical level.  Nonobstructive right and possibly left nephrolithiasis. -Bone scan on 11/10/2019 shows right proximal tibial metaphyseal activity from recent trauma.  Degenerative findings in the spine, right sternoclavicular joint, shoulders and right foot.  No evidence of metastatic disease. -Foundation 1 test shows MS-stable.  AR amplification.  Sensitivity is reduced due to sample quality. -Guardant 360 on 12/12/2019 MSI high not detected.  TMB was not evaluable.  No other targetable mutations. -F-18-PYLARIFY PET scan showed intense radiotracer activity in the left supraclavicular and prevascular lymph nodes, measuring 10 mm.  Cluster of small prevascular lymph nodes measures 5-7 mm each with SUV 54.  AP window lymph node measuring 8 mm.  No activity in the prostate gland or abdominal or pelvic lymph nodes.  No skeletal activity. - SBRT to the left supraclavicular lymph node and prevascular mediastinal  lymph node from 08/27/2020 through 09/06/2020, 40 Gray in 5 fractions. - CT CAP (10/22/2021): Done at [facility]: No lymphadenopathy.  Cirrhosis.  Intrahepatic and extrahepatic biliary ductal dilatation. - Bone scan (10/28/2021): New focal abnormal tracer uptake at the inferior aspect of the right scapula. - PSMA PET scan (11/13/2021): Interval development of multifocal tracer avid bone metastasis, only 1 of these lesions is visible on the recent nuclear medicine bone scan dated 10/28/2021.  New tracer avid right axillary, subcarinal, left paratracheal, left retrocrural and upper abdominal lymph nodes. - Cycle 1 of cabazitaxel on 12/15/2021, cycle 2 on 01/06/2022   2.  Androgen deprivation therapy induced bone loss: -Received Zometa every 3 months for a year completed on 10/29/2017.   3.  Genetic testing: -Germline mutation testing was negative.  Plan: 1.  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  Retrocrural lymph nodes remain subcentimeter.  No evidence of new metastatic disease in the chest, abdomen or pelvis. - Reviewed bone scan images from 09/01/2022: Focal activity in the tip of the right scapula.  No activity at L1 lesion.  Bilateral shoulder and sternomanubrial activity, likely degenerative. - Reviewed labs today: LFTs are normal.  CBC was grossly normal.  He will proceed with next cycle today.  Will follow-up on PSA today.  If it trends up, will hold treatment and consider PSMA PET imaging.   2.  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  Continue Xgeva every 6 weeks.   3.  Leg swelling: - Continue Bumex 1 and half tablets daily.  Mild swelling is stable.   4.  Peripheral neuropathy: - Numbness in the feet has been  stable.  5.  Hypomagnesemia: - Magnesium is 1.5 today.  He will receive IV magnesium today.  I will increase his home magnesium to 1  tablet twice daily.  Orders Placed This Encounter  Procedures   CBC with Differential    Standing Status:   Future    Standing Expiration Date:   09/29/2023   Comprehensive metabolic panel    Standing Status:   Future    Standing Expiration Date:   09/29/2023   CBC with Differential    Standing Status:   Future    Standing Expiration Date:   10/20/2023   Comprehensive metabolic panel    Standing Status:   Future    Standing Expiration Date:   10/20/2023   CBC with Differential    Standing Status:   Future    Standing Expiration Date:   11/10/2023   Comprehensive metabolic panel    Standing Status:   Future    Standing Expiration Date:   11/10/2023     I,Alexis Herring,acting as a scribe for [name], MD.,have documented all relevant documentation on the behalf of [name], MD,as directed by  [name], MD while in the presence of [name], MD.  I, [name] MD, have reviewed the above documentation for accuracy and completeness, and I agree with the above.   [name], MD   4/16/20246:10 PM  CHIEF COMPLAINT:   Diagnosis: metastatic prostate cancer to intrathoracic lymph node    Cancer Staging  No matching staging information was found for the patient.   Prior Therapy: 1. Docetaxel x 14 cycles from 08/11/2015 through 06/12/2016. 2. Abiraterone from 07/13/2016 through 04/09/2019. 3. Enzalutamide from 04/12/2019 to 11/03/2019.   Current Therapy:   Cabazitaxel + Prednisone q21d   HISTORY OF PRESENT ILLNESS:   Oncology History  Prostate cancer metastatic to intrathoracic lymph node  07/24/2015 Initial Diagnosis   Prostate cancer metastatic to the left supraclavicular and anterior mediastinal lymph nodes   08/21/2015 - 06/12/2016 Chemotherapy   Docetaxel every 3 weeks for 14 cycles, discontinued as PSA has plateaued around 11    07/13/2016 Treatment Plan Change   Zytiga 1000 milligrams daily along with prednisone 5 mg  daily, started secondary to fluid overload from docetaxel   10/14/2018 Genetic Testing   Negative genetic testing.  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  The Common Hereditary Gene Panel offered by [facility] includes sequencing and/or deletion duplication testing of the following 48 genes: APC, ATM, AXIN2, BARD1, BMPR1A, BRCA1, BRCA2, BRIP1, CDH1, CDK4, CDKN2A (p14ARF), CDKN2A (p16INK4a), CHEK2, CTNNA1, DICER1, EPCAM (Deletion/duplication testing only), GREM1 (promoter region deletion/duplication testing only), KIT, MEN1, MLH1, MSH2, MSH3, MSH6, MUTYH, NBN, NF1, NHTL1, PALB2, PDGFRA, PMS2, POLD1, POLE, PTEN, RAD50, RAD51C, RAD51D, RNF43, SDHB, SDHC, SDHD, SMAD4, SMARCA4. STK11, TP53, TSC1, TSC2, and VHL.  The following genes were evaluated for sequence changes only: SDHA and HOXB13 c.251G>A variant only. The report date is Oct 14, 2018.   09/18/2019 Genetic Testing   Foundation One CDx      12/12/2019 Genetic Testing   Guardant 360       12/15/2021 - 01/06/2022 Chemotherapy   Patient is on Treatment Plan : PROSTATE Cabazitaxel + Prednisone q21d     12/15/2021 -  Chemotherapy   Patient is on Treatment Plan : PROSTATE Cabazitaxel (20) D1 + Prednisone D1-21 q21d        INTERVAL HISTORY:   Andre [name] is a 82 y.o. male presenting to clinic today for follow up of metastatic prostate cancer to  intrathoracic lymph node. He was last seen by me on 08/18/22.  Today, he states that he is doing well overall. His appetite level is at 80%. His energy level is at 50%.   PAST MEDICAL HISTORY:   Past Medical History: Past Medical History:  Diagnosis Date   COPD (chronic obstructive pulmonary disease) (HCC)    Diabetes mellitus without complication (HCC)    Family history of ovarian cancer    Family history of prostate cancer    Hypertension    Prostate cancer [facility])     Surgical History: Past Surgical History:  Procedure Laterality Date   GOLD SEED IMPLANT      Social History: Social History    Socioeconomic History   Marital status: Widowed    Spouse name: Not on file   Number of children: Not on file   Years of education: Not on file   Highest education level: Not on file  Occupational History   Not on file  Tobacco Use   Smoking status: Former    Packs/day: 0.25    Years: 60.00    Additional pack years: 0.00    Total pack years: 15.00    Types: Cigarettes    Quit date: 06/25/2014    Years since quitting: 8.2   Smokeless tobacco: Never   Tobacco comments:    smoked since young age, quit in 2016  Substance and Sexual Activity   Alcohol use: Not Currently   Drug use: No   Sexual activity: Not Currently  Other Topics Concern   Not on file  Social History Narrative   Not on file   Social Determinants of Health   Financial Resource Strain: Low Risk  (05/21/2020)   Overall Financial Resource Strain (CARDIA)    Difficulty of Paying Living Expenses: Not hard at all  Food Insecurity: No Food Insecurity (05/21/2020)   Hunger Vital Sign    Worried About Running Out of Food in the Last Year: Never true    Ran Out of Food in the Last Year: Never true  Transportation Needs: No Transportation Needs (05/21/2020)   PRAPARE - Administrator, Civil Service (Medical): No    Lack of Transportation (Non-Medical): No  Physical Activity: Insufficiently Active (05/21/2020)   Exercise Vital Sign    Days of Exercise per Week: 2 days    Minutes of Exercise per Session: 20 min  Stress: No Stress Concern Present (05/21/2020)   [employer] of Occupational Health - Occupational Stress Questionnaire    Feeling of Stress : Not at all  Social Connections: Moderately Isolated (05/21/2020)   Social Connection and Isolation Panel [NHANES]    Frequency of Communication with Friends and Family: More than three times a week    Frequency of Social Gatherings with Friends and Family: More than three times a week    Attends Religious Services: 1 to 4 times per year    Active  Member of [employer] or Organizations: No    Attends Banker Meetings: Never    Marital Status: Widowed  Intimate Partner Violence: Not At Risk (05/21/2020)   Humiliation, Afraid, Rape, and Kick questionnaire    Fear of Current or Ex-Partner: No    Emotionally Abused: No    Physically Abused: No    Sexually Abused: No    Family History: Family History  Problem Relation Age of Onset   Ovarian cancer Mother 14   Heart attack Father    Prostate cancer Maternal Uncle 48  Cancer Cousin        pat cousin with unknown cancer   Colon cancer Neg Hx    Pancreatic cancer Neg Hx    Breast cancer Neg Hx     Current Medications:  Current Outpatient Medications:    ACCU-CHEK GUIDE test strip, , Disp: , Rfl:    Accu-Chek Softclix Lancets lancets, , Disp: , Rfl:    acetaminophen (TYLENOL) 500 MG tablet, Take 500 mg by mouth every 6 (six) hours as needed for moderate pain., Disp: , Rfl:    albuterol (ACCUNEB) 0.63 MG/3ML nebulizer solution, Take 1 ampule by nebulization every 6 (six) hours as needed for wheezing., Disp: , Rfl:    albuterol (PROVENTIL) (2.5 MG/3ML) 0.083% nebulizer solution, INHALE 3 ML IN NEBULIZER BY MOUTH FOUR TIMES A DAY AS NEEDED, Disp: , Rfl:    amLODipine (NORVASC) 2.5 MG tablet, Take 1 tablet (2.5 mg total) by mouth daily as needed., Disp: 30 tablet, Rfl: 0   aspirin 81 MG EC tablet, Take 1 tablet by mouth daily., Disp: , Rfl:    atorvastatin (LIPITOR) 10 MG tablet, TAKE 1 TABLET BY MOUTH EVERY DAY (Patient taking differently: Take 5 mg by mouth daily.), Disp: 90 tablet, Rfl: 3   azelastine (ASTELIN) 0.1 % nasal spray, USE 2 SPRAYS IN EACH NOSTRIL TWICE A DAY FOR ALLERGIC RHINITIS, Disp: , Rfl:    Blood Glucose Monitoring Suppl (ACCU-CHEK GUIDE) w/Device KIT, , Disp: , Rfl:    brompheniramine-pseudoephedrine-DM 30-2-10 MG/5ML syrup, TAKE 5 ML BY MOUTH EVERY 4 HOURS AS NEEDED FOR COUGH, Disp: 120 mL, Rfl: 0   bumetanide (BUMEX) 1 MG tablet, TAKE 1 AND 1/2  TABLETS(1.5 MG) BY MOUTH DAILY, Disp: 45 tablet, Rfl: 6   CALCIUM PO, Take 600 mg by mouth daily. , Disp: , Rfl:    cetirizine (ZYRTEC) 10 MG tablet, Take 1 tablet by mouth daily., Disp: , Rfl:    CHERATUSSIN AC 100-10 MG/5ML syrup, Take 5 mLs by mouth daily as needed. , Disp: , Rfl: 0   Cholecalciferol (VITAMIN D3) 50 MCG (2000 UT) capsule, Take 2,000 Units by mouth daily., Disp: , Rfl:    cyclobenzaprine (FLEXERIL) 10 MG tablet, Take 10 mg by mouth as needed. , Disp: , Rfl: 0   dicyclomine (BENTYL) 20 MG tablet, Take 1 tablet by mouth 3 (three) times daily as needed., Disp: , Rfl:    dorzolamide (TRUSOPT) 2 % ophthalmic solution, Place 1 drop into both eyes 3 (three) times daily., Disp: , Rfl:    doxycycline (VIBRAMYCIN) 100 MG capsule, Take 100 mg by mouth 2 (two) times daily., Disp: , Rfl:    erythromycin ophthalmic ointment, 3 (three) times daily., Disp: , Rfl:    ferrous sulfate 325 (65 FE) MG tablet, Take 1 tablet by mouth daily., Disp: , Rfl:    fexofenadine (ALLEGRA) 180 MG tablet, Take 1 tablet every day by oral route at bedtime for 90 days., Disp: , Rfl:    fluticasone (FLONASE) 50 MCG/ACT nasal spray, USE 2 SPRAY(S) IN EACH NOSTRIL ONCE DAILY FOR 30 DAYS, Disp: 15.8 mL, Rfl: 12   guaiFENesin-dextromethorphan (ROBITUSSIN DM) 100-10 MG/5ML syrup, Take 5 mLs by mouth every 4 (four) hours as needed for cough., Disp: 118 mL, Rfl: 0   ibuprofen (ADVIL,MOTRIN) 600 MG tablet, Take 600 mg by mouth every 6 (six) hours as needed. , Disp: , Rfl: 0   Ipratropium-Albuterol (COMBIVENT) 20-100 MCG/ACT AERS respimat, INHALE 1 PUFF BY MOUTH FOUR TIMES A DAY AS NEEDED COPD, Disp: ,  Rfl:    latanoprost (XALATAN) 0.005 % ophthalmic solution, INSTILL 1 DROP INTO AFFECTED EYE(S) BY OPHTHALMIC ROUTE ONCE DAILY INTHE EVENING, Disp: , Rfl:    Latanoprostene Bunod (VYZULTA) 0.024 % SOLN, Apply to eye., Disp: , Rfl:    Leuprolide Acetate, 4 Month, (ELIGARD) 30 MG injection, 30 MG SUBCUTANEOUSLY Q90D PRN, Disp: ,  Rfl:    losartan (COZAAR) 100 MG tablet, Take 1 tablet by mouth daily., Disp: , Rfl:    magnesium oxide (MAG-OX) 400 MG tablet, Take 1 tablet (400 mg total) by mouth daily. (Patient taking differently: Take 400 mg by mouth 2 (two) times daily.), Disp: 90 tablet, Rfl: 2   metFORMIN (GLUCOPHAGE) 500 MG tablet, TAKE 2 TABLETS(1000 MG) BY MOUTH TWICE DAILY WITH A MEAL, Disp: 120 tablet, Rfl: 3   montelukast (SINGULAIR) 10 MG tablet, Take 10 mg by mouth at bedtime., Disp: , Rfl:    Multiple Vitamins-Minerals (PRESERVISION AREDS PO), 1 tablet daily., Disp: , Rfl:    potassium chloride (MICRO-K) 10 MEQ CR capsule, TAKE 4 CAPSULES BY MOUTH WITH FOOD. OPEN AND SPRINKLE ON FOOD, Disp: 120 capsule, Rfl: 3   predniSONE (DELTASONE) 5 MG tablet, TAKE 1 TABLET BY MOUTH DAILY WITH BREAKFAST, Disp: 30 tablet, Rfl: 3   sucralfate (CARAFATE) 1 g tablet, Take 1 tablet by mouth 4 (four) times daily as needed., Disp: , Rfl:    TRELEGY ELLIPTA 100-62.5-25 MCG/INH AEPB, INHALE 1 PUFF ONCE DAILY, Disp: , Rfl:    vitamin C (ASCORBIC ACID) 500 MG tablet, Take 500 mg by mouth daily., Disp: , Rfl:    prochlorperazine (COMPAZINE) 10 MG tablet, Take 1 tablet (10 mg total) by mouth every 6 (six) hours as needed for nausea or vomiting. (Patient not taking: Reported on 09/08/2022), Disp: 60 tablet, Rfl: 3 No current facility-administered medications for this visit.  Facility-Administered Medications Ordered in Other Visits:    denosumab (XGEVA) 120 MG/1.7ML injection, , , ,    diphenhydrAMINE (BENADRYL) 50 MG/ML injection, , , ,    famotidine (PEPCID) 20-0.9 MG/50ML-% IVPB, , , ,    sodium chloride flush (NS) 0.9 % injection 10 mL, 10 mL, Intracatheter, PRN, [name], MD, 10 mL at 09/08/22 1417   Allergies: Allergies  Allergen Reactions   Lisinopril Cough   Metoprolol Other (See Comments) and Cough    Increases frequency of cough Other reaction(s): Other (See Comments) Increases frequency of cough   Semaglutide  Nausea And Vomiting    REVIEW OF SYSTEMS:   Review of Systems  Constitutional:  Negative for chills, fatigue and fever.  HENT:   Negative for lump/mass, mouth sores, nosebleeds, sore throat and trouble swallowing.   Eyes:  Negative for eye problems.  Respiratory:  Positive for cough and shortness of breath.   Cardiovascular:  Negative for chest pain, leg swelling and palpitations.  Gastrointestinal:  Negative for abdominal pain, constipation, diarrhea, nausea and vomiting.  Genitourinary:  Negative for bladder incontinence, difficulty urinating, dysuria, frequency, hematuria and nocturia.   Musculoskeletal:  Negative for arthralgias, back pain, flank pain, myalgias and neck pain.  Skin:  Negative for itching and rash.  Neurological:  Positive for numbness. Negative for dizziness and headaches.  Hematological:  Does not bruise/bleed easily.  Psychiatric/Behavioral:  Negative for depression, sleep disturbance and suicidal ideas. The patient is not nervous/anxious.   All other systems reviewed and are negative.    VITALS:   There were no vitals taken for this visit.  Wt Readings from Last 3 Encounters:  09/08/22  235 lb 3.2 oz (106.7 kg)  08/18/22 234 lb 12.8 oz (106.5 kg)  08/05/22 232 lb (105.2 kg)    There is no height or weight on file to calculate BMI.  Performance status (ECOG): 1 - Symptomatic but completely ambulatory  PHYSICAL EXAM:   Physical Exam Vitals and nursing note reviewed. Exam conducted with a chaperone present.  Constitutional:      Appearance: Normal appearance.  Cardiovascular:     Rate and Rhythm: Normal rate and regular rhythm.     Pulses: Normal pulses.     Heart sounds: Normal heart sounds.  Pulmonary:     Effort: Pulmonary effort is normal.     Breath sounds: Normal breath sounds.  Abdominal:     Palpations: Abdomen is soft. There is no hepatomegaly, splenomegaly or mass.     Tenderness: There is no abdominal tenderness.  Musculoskeletal:      Right lower leg: Edema present.     Left lower leg: Edema present.  Lymphadenopathy:     Cervical: No cervical adenopathy.     Right cervical: No superficial, deep or posterior cervical adenopathy.    Left cervical: No superficial, deep or posterior cervical adenopathy.     Upper Body:     Right upper body: No supraclavicular or axillary adenopathy.     Left upper body: No supraclavicular or axillary adenopathy.  Neurological:     General: No focal deficit present.     Mental Status: He is alert and oriented to person, place, and time.  Psychiatric:        Mood and Affect: Mood normal.        Behavior: Behavior normal.     LABS:      Latest Ref Rng & Units 09/08/2022    8:55 AM 08/18/2022   10:22 AM 08/05/2022    3:48 PM  CBC  WBC 4.0 - 10.5 K/uL 12.2  10.4    Hemoglobin 13.0 - 17.0 g/dL 14.7  82.9  56.2   Hematocrit 39.0 - 52.0 % 34.5  35.3  37.0   Platelets 150 - 400 K/uL 311  392        Latest Ref Rng & Units 09/08/2022    8:55 AM 08/18/2022   10:22 AM 08/05/2022    4:17 PM  CMP  Glucose 70 - 99 mg/dL 130  865  784   BUN 8 - 23 mg/dL Creatinine 0.61 - 1.24 mg/dL 6.96  2.95  2.84   Sodium 135 - 145 mmol/L 136  139  142   Potassium 3.5 - 5.1 mmol/L 3.4  3.2  3.4   Chloride 98 - 111 mmol/L 103  100  100   CO2 22 - 32 mmol/L Calcium 8.9 - 10.3 mg/dL 8.6  8.3  9.0   Total Protein 6.5 - 8.1 g/dL 6.1  6.2  6.4   Total Bilirubin 0.3 - 1.2 mg/dL 0.6  0.8  1.0   Alkaline Phos 38 - 126 U/L 57  47  89   AST 15 - 41 U/L ALT 0 - 44 U/L No results found for: "CEA1", "CEA" / No results found for: "CEA1", "CEA" No results found for: "PSA1" No results found for: "XLK440" No results found for: "CAN125"  No results found for: "TOTALPROTELP", "ALBUMINELP", "A1GS", "A2GS", "BETS", "BETA2SER", "GAMS", "MSPIKE", "SPEI"  Lab Results  Component Value Date   TIBC 323 05/20/2022   TIBC 325 12/15/2021   TIBC 268 10/15/2021    FERRITIN 133 05/20/2022   FERRITIN 117 12/15/2021   FERRITIN 330 10/15/2021   IRONPCTSAT 12 (L) 05/20/2022   IRONPCTSAT 10 (L) 12/15/2021   IRONPCTSAT 23 10/15/2021   No results found for: "LDH"   STUDIES:   NM Bone Scan Whole Body  Result Date: 09/03/2022 CLINICAL DATA:  Prostate cancer EXAM: NUCLEAR MEDICINE WHOLE BODY BONE SCAN TECHNIQUE: Whole body anterior and posterior images were obtained approximately 3 hours after intravenous injection of radiopharmaceutical. RADIOPHARMACEUTICALS:  20.5 mCi Technetium-44m MDP IV COMPARISON:  CT from 08/14/2022 FINDINGS: Bilateral shoulder and sternomanubrial activity probably degenerative findings. Focal activity identified in the inferior scapula on the right corresponding to sclerotic osseous metastatic disease. There is no significant activity observed at the L1 level and correspond to the L1 lesion. There is bilateral renal activity. IMPRESSION: 1. Focal activity tip of the right scapula corresponding to sclerotic osseous metastatic disease. 2. No activity identified to correspond to an L1 lesion observed on CT. 3. Bilateral shoulder and sternomanubrial activity which are likely to be degenerative findings. Electronically Signed   By: Layla Maw M.D.   On: 09/03/2022 07:59   CT CHEST ABDOMEN PELVIS W CONTRAST  Result Date: 08/16/2022 CLINICAL DATA:  Metastatic prostate cancer restaging, ongoing chemotherapy * Tracking Code: BO * EXAM: CT CHEST, ABDOMEN, AND PELVIS WITH CONTRAST TECHNIQUE: Multidetector CT imaging of the chest, abdomen and pelvis was performed following the standard protocol during bolus administration of intravenous contrast. RADIATION DOSE REDUCTION: This exam was performed according to the departmental dose-optimization program which includes automated exposure control, adjustment of the mA and/or kV according to patient size and/or use of iterative reconstruction technique. CONTRAST:  OMNIPAQUE IOHEXOL 300 MG/ML  SOLN  COMPARISON:  CT chest, 08/05/2022, CT chest abdomen pelvis, 04/13/2022 FINDINGS: CT CHEST FINDINGS Cardiovascular: Aortic atherosclerosis. Aortic valve and mitral annulus calcifications right chest port catheter. Cardiomegaly. Three-vessel coronary artery calcifications. No pericardial effusion. Mediastinum/Nodes: No enlarged mediastinal, hilar, or axillary lymph nodes. Thyroid gland, trachea, and esophagus demonstrate no significant findings. Lungs/Pleura: Mild centrilobular emphysema. Unchanged fibrotic scarring and volume loss of the paramedian left upper lobe (series 3, image 45) and dependent left lung base (series 3, image 105). No pleural effusion or pneumothorax. Musculoskeletal: No chest wall abnormality. No acute osseous findings. CT ABDOMEN PELVIS FINDINGS Hepatobiliary: No solid liver abnormality is seen. Benign fluid attenuation cysts scattered throughout the liver, for which no specific follow-up or characterization is required. No gallstones or gallbladder wall thickening. Unchanged mild intra and extrahepatic biliary ductal dilatation, common bile duct measuring up to 1.0 cm. Pancreas: Unremarkable. No pancreatic ductal dilatation or surrounding inflammatory changes. Spleen: Normal in size without significant abnormality. Adrenals/Urinary Tract: Adrenal glands are unremarkable. Nonobstructive bilateral inferior pole renal calculi. Benign bilateral renal cortical and parapelvic cysts, for which no further follow-up or characterization is required. Bladder is unremarkable. Stomach/Bowel: Stomach is within normal limits. Appendix appears normal. No evidence of bowel wall thickening, distention, or inflammatory changes. Descending colonic diverticulosis. Vascular/Lymphatic: Aortic atherosclerosis. No enlarged abdominal or pelvic lymph nodes. Retrocrural lymph nodes remain subcentimeter (series 2, image 58). Reproductive: Prostate brachytherapy. Other: No abdominal wall hernia or abnormality. No ascites.  Musculoskeletal: No acute osseous findings. Unchanged sclerotic osseous metastatic lesions, including of L1 (series 5, image 118) and of the right scapula (series 2, image 31). IMPRESSION: 1. Unchanged sclerotic osseous metastatic lesions, including of  L1 and of the right scapula. 2. Retrocrural lymph nodes remain subcentimeter. No persistently enlarged lymph nodes in the chest, abdomen, or pelvis. 3. No evidence of new metastatic disease in the chest, abdomen, or pelvis. 4. Prostate brachytherapy. 5. Emphysema.  Unchanged scarring and volume loss of the left lung. 6. Nonobstructive bilateral nephrolithiasis. 7. Unchanged mild intra and extrahepatic biliary ductal dilatation, of doubtful clinical significance. 8. Coronary artery disease. Aortic Atherosclerosis (ICD10-I70.0) and Emphysema (ICD10-J43.9). Electronically Signed   By: Jearld Lesch M.D.   On: 08/16/2022 07:42

## 2022-09-08 ENCOUNTER — Inpatient Hospital Stay: Payer: Medicare PPO | Attending: Hematology

## 2022-09-08 ENCOUNTER — Inpatient Hospital Stay: Payer: Medicare PPO

## 2022-09-08 ENCOUNTER — Inpatient Hospital Stay (HOSPITAL_BASED_OUTPATIENT_CLINIC_OR_DEPARTMENT_OTHER): Payer: Medicare PPO | Admitting: Hematology

## 2022-09-08 VITALS — BP 158/76 | HR 89 | Temp 98.4°F | Resp 18

## 2022-09-08 VITALS — BP 150/86 | HR 82 | Temp 96.9°F | Resp 20 | Wt 235.2 lb

## 2022-09-08 DIAGNOSIS — C61 Malignant neoplasm of prostate: Secondary | ICD-10-CM

## 2022-09-08 DIAGNOSIS — Z5189 Encounter for other specified aftercare: Secondary | ICD-10-CM | POA: Insufficient documentation

## 2022-09-08 DIAGNOSIS — C7951 Secondary malignant neoplasm of bone: Secondary | ICD-10-CM | POA: Diagnosis present

## 2022-09-08 DIAGNOSIS — Z79818 Long term (current) use of other agents affecting estrogen receptors and estrogen levels: Secondary | ICD-10-CM | POA: Diagnosis not present

## 2022-09-08 DIAGNOSIS — Z8041 Family history of malignant neoplasm of ovary: Secondary | ICD-10-CM | POA: Diagnosis not present

## 2022-09-08 DIAGNOSIS — G629 Polyneuropathy, unspecified: Secondary | ICD-10-CM | POA: Diagnosis not present

## 2022-09-08 DIAGNOSIS — Z87891 Personal history of nicotine dependence: Secondary | ICD-10-CM | POA: Insufficient documentation

## 2022-09-08 DIAGNOSIS — Z79899 Other long term (current) drug therapy: Secondary | ICD-10-CM | POA: Diagnosis not present

## 2022-09-08 DIAGNOSIS — Z8042 Family history of malignant neoplasm of prostate: Secondary | ICD-10-CM | POA: Insufficient documentation

## 2022-09-08 DIAGNOSIS — M7989 Other specified soft tissue disorders: Secondary | ICD-10-CM | POA: Diagnosis not present

## 2022-09-08 DIAGNOSIS — C771 Secondary and unspecified malignant neoplasm of intrathoracic lymph nodes: Secondary | ICD-10-CM | POA: Diagnosis not present

## 2022-09-08 DIAGNOSIS — C778 Secondary and unspecified malignant neoplasm of lymph nodes of multiple regions: Secondary | ICD-10-CM | POA: Diagnosis not present

## 2022-09-08 DIAGNOSIS — Z95828 Presence of other vascular implants and grafts: Secondary | ICD-10-CM

## 2022-09-08 DIAGNOSIS — Z5111 Encounter for antineoplastic chemotherapy: Secondary | ICD-10-CM | POA: Insufficient documentation

## 2022-09-08 LAB — CBC WITH DIFFERENTIAL/PLATELET
Abs Immature Granulocytes: 0.08 10*3/uL — ABNORMAL HIGH (ref 0.00–0.07)
Basophils Absolute: 0.1 10*3/uL (ref 0.0–0.1)
Basophils Relative: 1 %
Eosinophils Absolute: 0 10*3/uL (ref 0.0–0.5)
Eosinophils Relative: 0 %
HCT: 34.5 % — ABNORMAL LOW (ref 39.0–52.0)
Hemoglobin: 10.8 g/dL — ABNORMAL LOW (ref 13.0–17.0)
Immature Granulocytes: 1 %
Lymphocytes Relative: 7 %
Lymphs Abs: 0.9 10*3/uL (ref 0.7–4.0)
MCH: 30.2 pg (ref 26.0–34.0)
MCHC: 31.3 g/dL (ref 30.0–36.0)
MCV: 96.4 fL (ref 80.0–100.0)
Monocytes Absolute: 1.3 10*3/uL — ABNORMAL HIGH (ref 0.1–1.0)
Monocytes Relative: 11 %
Neutro Abs: 9.8 10*3/uL — ABNORMAL HIGH (ref 1.7–7.7)
Neutrophils Relative %: 80 %
Platelets: 311 10*3/uL (ref 150–400)
RBC: 3.58 MIL/uL — ABNORMAL LOW (ref 4.22–5.81)
RDW: 15.4 % (ref 11.5–15.5)
WBC: 12.2 10*3/uL — ABNORMAL HIGH (ref 4.0–10.5)
nRBC: 0 % (ref 0.0–0.2)

## 2022-09-08 LAB — COMPREHENSIVE METABOLIC PANEL
ALT: 18 U/L (ref 0–44)
AST: 18 U/L (ref 15–41)
Albumin: 3.6 g/dL (ref 3.5–5.0)
Alkaline Phosphatase: 57 U/L (ref 38–126)
Anion gap: 5 (ref 5–15)
BUN: 12 mg/dL (ref 8–23)
CO2: 28 mmol/L (ref 22–32)
Calcium: 8.6 mg/dL — ABNORMAL LOW (ref 8.9–10.3)
Chloride: 103 mmol/L (ref 98–111)
Creatinine, Ser: 0.99 mg/dL (ref 0.61–1.24)
GFR, Estimated: >60 mL/min (ref >60)
Glucose, Bld: 169 mg/dL — ABNORMAL HIGH (ref 70–99)
Potassium: 3.4 mmol/L — ABNORMAL LOW (ref 3.5–5.1)
Sodium: 136 mmol/L (ref 135–145)
Total Bilirubin: 0.6 mg/dL (ref 0.3–1.2)
Total Protein: 6.1 g/dL — ABNORMAL LOW (ref 6.5–8.1)

## 2022-09-08 LAB — MAGNESIUM: Magnesium: 1.5 mg/dL — ABNORMAL LOW (ref 1.7–2.4)

## 2022-09-08 LAB — PSA: Prostatic Specific Antigen: 18.29 ng/mL — ABNORMAL HIGH (ref 0.00–4.00)

## 2022-09-08 MED ORDER — SODIUM CHLORIDE 0.9% FLUSH
10.0000 mL | INTRAVENOUS | Status: DC | PRN
  Administered 2022-09-08: 10 mL

## 2022-09-08 MED ORDER — HEPARIN SOD (PORK) LOCK FLUSH 100 UNIT/ML IV SOLN
500.0000 [IU] | Freq: Once | INTRAVENOUS | Status: AC | PRN
  Administered 2022-09-08: 500 [IU]

## 2022-09-08 MED ORDER — FAMOTIDINE IN NACL 20-0.9 MG/50ML-% IV SOLN
20.0000 mg | Freq: Once | INTRAVENOUS | Status: AC
  Administered 2022-09-08: 20 mg via INTRAVENOUS
  Filled 2022-09-08: qty 50

## 2022-09-08 MED ORDER — SODIUM CHLORIDE 0.9 % IV SOLN
10.0000 mg | Freq: Once | INTRAVENOUS | Status: AC
  Administered 2022-09-08: 10 mg via INTRAVENOUS
  Filled 2022-09-08: qty 10

## 2022-09-08 MED ORDER — DENOSUMAB 120 MG/1.7ML ~~LOC~~ SOLN
120.0000 mg | Freq: Once | SUBCUTANEOUS | Status: AC
  Administered 2022-09-08: 120 mg via SUBCUTANEOUS
  Filled 2022-09-08: qty 1.7

## 2022-09-08 MED ORDER — ONDANSETRON HCL 4 MG/2ML IJ SOLN
8.0000 mg | Freq: Once | INTRAMUSCULAR | Status: AC
  Administered 2022-09-08: 8 mg via INTRAVENOUS
  Filled 2022-09-08: qty 4

## 2022-09-08 MED ORDER — MAGNESIUM SULFATE 2 GM/50ML IV SOLN
2.0000 g | Freq: Once | INTRAVENOUS | Status: AC
  Administered 2022-09-08: 2 g via INTRAVENOUS
  Filled 2022-09-08: qty 50

## 2022-09-08 MED ORDER — SODIUM CHLORIDE 0.9% FLUSH
10.0000 mL | Freq: Once | INTRAVENOUS | Status: AC
  Administered 2022-09-08: 10 mL via INTRAVENOUS

## 2022-09-08 MED ORDER — PEGFILGRASTIM 6 MG/0.6ML ~~LOC~~ PSKT
6.0000 mg | PREFILLED_SYRINGE | Freq: Once | SUBCUTANEOUS | Status: AC
  Administered 2022-09-08: 6 mg via SUBCUTANEOUS
  Filled 2022-09-08: qty 0.6

## 2022-09-08 MED ORDER — SODIUM CHLORIDE 0.9 % IV SOLN: Freq: Once | INTRAVENOUS | Status: AC

## 2022-09-08 MED ORDER — SODIUM CHLORIDE 0.9 % IV SOLN
20.0000 mg/m2 | Freq: Once | INTRAVENOUS | Status: AC
  Administered 2022-09-08: 45 mg via INTRAVENOUS
  Filled 2022-09-08: qty 4.5

## 2022-09-08 MED ORDER — CETIRIZINE HCL 10 MG/ML IV SOLN
10.0000 mg | Freq: Once | INTRAVENOUS | Status: AC
  Administered 2022-09-08: 10 mg via INTRAVENOUS
  Filled 2022-09-08: qty 1

## 2022-09-08 MED ORDER — LEUPROLIDE ACETATE (6 MONTH) 45 MG ~~LOC~~ KIT
45.0000 mg | PACK | Freq: Once | SUBCUTANEOUS | Status: AC
  Administered 2022-09-08: 45 mg via SUBCUTANEOUS
  Filled 2022-09-08: qty 45

## 2022-09-08 NOTE — Patient Instructions (Addendum)
[facility] at [facility] Discharge Instructions   You were seen and examined today by Dr. Ellin Saba.  He reviewed the results of your lab work which are mostly normal/stable. Your potassium is slightly low at 3.4. Your magnesium is also low at 1.5. We will give you potassium pills and IV magnesium in the clinic today to correct these. Increase magnesium to one pill twice a day.   He reviewed the results of your bone scan which was stable. Nothing new was seen on this exam.   We will proceed with your treatment today.   Return as scheduled.    Thank you for choosing [facility] at [facility] to provide your oncology and hematology care.  To afford each patient quality time with our provider, please arrive at least 15 minutes before your scheduled appointment time.   If you have a lab appointment with the Cancer Center please come in thru the Main Entrance and check in at the main information desk.  You need to re-schedule your appointment should you arrive 10 or more minutes late.  We strive to give you quality time with our providers, and arriving late affects you and other patients whose appointments are after yours.  Also, if you no show three or more times for appointments you may be dismissed from the clinic at the providers discretion.     Again, thank you for choosing [facility].  Our hope is that these requests will decrease the amount of time that you wait before being seen by our physicians.       _____________________________________________________________  Should you have questions after your visit to [facility], please contact our office at [phone] and follow the prompts.  Our office hours are 8:00 a.m. and 4:30 p.m. Monday - Friday.  Please note that voicemails left after 4:00 p.m. may not be returned until the following business day.  We are closed weekends and major holidays.  You do have access  to a nurse 24-7, just call the main number to the clinic [phone] and do not press any options, hold on the line and a nurse will answer the phone.    For prescription refill requests, have your pharmacy contact our office and allow 72 hours.    Due to Covid, you will need to wear a mask upon entering the hospital. If you do not have a mask, a mask will be given to you at the Main Entrance upon arrival. For doctor visits, patients may have 1 support person age 75 or older with them. For treatment visits, patients can not have anyone with them due to social distancing guidelines and our immunocompromised population.

## 2022-09-08 NOTE — Progress Notes (Signed)
Patient presents today for Jevtana infusion per providers order.  Vital signs and labs reviewed by MD.  Message received from Chapman Moss RN/Dr. Ellin Saba patient okay for treatment.  Patient alson to receive 2 grams Magnesium IV, Xgeva and Eliguard.  Treatment given today per MD orders.  Stable during infusion without adverse affects.  Rivka Barbara and Eliguard administration without incident; injection site WNL; see MAR for injection details.  Vital signs stable.  Neulasta On-pro placed and light blinking green.  No complaints at this time.  Discharge from clinic ambulatory in stable condition.  Alert and oriented X 3.  Follow up with [facility] as scheduled.

## 2022-09-08 NOTE — Patient Instructions (Signed)
[facility] AT [facility]  Discharge Instructions: Thank you for choosing [facility] to provide your oncology and hematology care.  If you have a lab appointment with the Cancer Center - please note that after April 8th, 2024, all labs will be drawn in the cancer center.  You do not have to check in or register with the main entrance as you have in the past but will complete your check-in in the cancer center.  Wear comfortable clothing and clothing appropriate for easy access to any Portacath or PICC line.   We strive to give you quality time with your provider. You may need to reschedule your appointment if you arrive late (15 or more minutes).  Arriving late affects you and other patients whose appointments are after yours.  Also, if you miss three or more appointments without notifying the office, you may be dismissed from the clinic at the provider's discretion.      For prescription refill requests, have your pharmacy contact our office and allow 72 hours for refills to be completed.    Today you received the following chemotherapy and/or immunotherapy agents Jevtana      To help prevent nausea and vomiting after your treatment, we encourage you to take your nausea medication as directed.  BELOW ARE SYMPTOMS THAT SHOULD BE REPORTED IMMEDIATELY: *FEVER GREATER THAN 100.4 F (38 C) OR HIGHER *CHILLS OR SWEATING *NAUSEA AND VOMITING THAT IS NOT CONTROLLED WITH YOUR NAUSEA MEDICATION *UNUSUAL SHORTNESS OF BREATH *UNUSUAL BRUISING OR BLEEDING *URINARY PROBLEMS (pain or burning when urinating, or frequent urination) *BOWEL PROBLEMS (unusual diarrhea, constipation, pain near the anus) TENDERNESS IN MOUTH AND THROAT WITH OR WITHOUT PRESENCE OF ULCERS (sore throat, sores in mouth, or a toothache) UNUSUAL RASH, SWELLING OR PAIN  UNUSUAL VAGINAL DISCHARGE OR ITCHING   Items with * indicate a potential emergency and should be followed up as soon as possible or go to the  Emergency Department if any problems should occur.  Please show the CHEMOTHERAPY ALERT CARD or IMMUNOTHERAPY ALERT CARD at check-in to the Emergency Department and triage nurse.  Should you have questions after your visit or need to cancel or reschedule your appointment, please contact [facility] AT [facility] [phone]  and follow the prompts.  Office hours are 8:00 a.m. to 4:30 p.m. Monday - Friday. Please note that voicemails left after 4:00 p.m. may not be returned until the following business day.  We are closed weekends and major holidays. You have access to a nurse at all times for urgent questions. Please call the main number to the clinic [phone] and follow the prompts.  For any non-urgent questions, you may also contact your provider using MyChart. We now offer e-Visits for anyone 22 and older to request care online for non-urgent symptoms. For details visit [email or web address].   Also download the MyChart app! Go to the app store, search "MyChart", open the app, select [place], and log in with your MyChart username and password.

## 2022-09-08 NOTE — Progress Notes (Signed)
Patient has been examined by Dr. Katragadda. Vital signs and labs have been reviewed by MD - ANC, Creatinine, LFTs, hemoglobin, and platelets are within treatment parameters per M.D. - pt may proceed with treatment.  Primary RN and pharmacy notified.  

## 2022-09-09 ENCOUNTER — Other Ambulatory Visit: Payer: Self-pay

## 2022-09-09 ENCOUNTER — Other Ambulatory Visit: Payer: Self-pay | Admitting: *Deleted

## 2022-09-09 DIAGNOSIS — C771 Secondary and unspecified malignant neoplasm of intrathoracic lymph nodes: Secondary | ICD-10-CM

## 2022-09-12 ENCOUNTER — Other Ambulatory Visit: Payer: Self-pay

## 2022-09-15 ENCOUNTER — Telehealth: Payer: Self-pay | Admitting: *Deleted

## 2022-09-15 NOTE — Telephone Encounter (Signed)
Received call from daughter Judeth Cornfield to advise that eye surgery has been rescheduled to 5/8.  Patient was to have treatment on 5/7.  Per Dr. Ellin Saba, treatment will be pushed to the following week with follow up.  Judeth Cornfield made aware of new appointment date and times.  Verbalized understanding.

## 2022-09-16 ENCOUNTER — Other Ambulatory Visit: Payer: Self-pay | Admitting: Hematology

## 2022-09-16 ENCOUNTER — Other Ambulatory Visit: Payer: Self-pay

## 2022-09-17 ENCOUNTER — Ambulatory Visit (HOSPITAL_COMMUNITY)
Admission: RE | Admit: 2022-09-17 | Discharge: 2022-09-17 | Disposition: A | Discharge status: Home / Self Care | Payer: Medicare PPO | Source: Ambulatory Visit | Attending: Hematology | Admitting: Hematology

## 2022-09-17 DIAGNOSIS — C771 Secondary and unspecified malignant neoplasm of intrathoracic lymph nodes: Secondary | ICD-10-CM

## 2022-09-17 DIAGNOSIS — C61 Malignant neoplasm of prostate: Secondary | ICD-10-CM | POA: Diagnosis present

## 2022-09-17 MED ORDER — PIFLIFOLASTAT F 18 (PYLARIFY) INJECTION
9.0000 | Freq: Once | INTRAVENOUS | Status: AC
  Administered 2022-09-17: 8.81 via INTRAVENOUS

## 2022-09-29 ENCOUNTER — Ambulatory Visit: Payer: Medicare PPO

## 2022-09-29 ENCOUNTER — Ambulatory Visit: Payer: Medicare PPO | Admitting: Hematology

## 2022-09-29 ENCOUNTER — Other Ambulatory Visit: Payer: Medicare PPO

## 2022-09-29 ENCOUNTER — Inpatient Hospital Stay: Payer: Medicare PPO

## 2022-09-29 ENCOUNTER — Inpatient Hospital Stay: Payer: Medicare PPO | Attending: Hematology | Admitting: Hematology

## 2022-09-29 DIAGNOSIS — C778 Secondary and unspecified malignant neoplasm of lymph nodes of multiple regions: Secondary | ICD-10-CM | POA: Insufficient documentation

## 2022-09-29 DIAGNOSIS — C771 Secondary and unspecified malignant neoplasm of intrathoracic lymph nodes: Secondary | ICD-10-CM

## 2022-09-29 DIAGNOSIS — C61 Malignant neoplasm of prostate: Secondary | ICD-10-CM | POA: Insufficient documentation

## 2022-09-29 DIAGNOSIS — C7951 Secondary malignant neoplasm of bone: Secondary | ICD-10-CM | POA: Insufficient documentation

## 2022-09-29 DIAGNOSIS — G629 Polyneuropathy, unspecified: Secondary | ICD-10-CM | POA: Diagnosis not present

## 2022-09-29 DIAGNOSIS — Z79899 Other long term (current) drug therapy: Secondary | ICD-10-CM | POA: Insufficient documentation

## 2022-09-29 DIAGNOSIS — Z87891 Personal history of nicotine dependence: Secondary | ICD-10-CM | POA: Diagnosis not present

## 2022-09-29 DIAGNOSIS — Z79818 Long term (current) use of other agents affecting estrogen receptors and estrogen levels: Secondary | ICD-10-CM | POA: Insufficient documentation

## 2022-09-29 DIAGNOSIS — Z5111 Encounter for antineoplastic chemotherapy: Secondary | ICD-10-CM | POA: Diagnosis present

## 2022-09-29 LAB — COMPREHENSIVE METABOLIC PANEL
ALT: 22 U/L (ref 0–44)
AST: 19 U/L (ref 15–41)
Albumin: 4 g/dL (ref 3.5–5.0)
Alkaline Phosphatase: 64 U/L (ref 38–126)
Anion gap: 9 (ref 5–15)
BUN: 21 mg/dL (ref 8–23)
CO2: 31 mmol/L (ref 22–32)
Calcium: 9.3 mg/dL (ref 8.9–10.3)
Chloride: 100 mmol/L (ref 98–111)
Creatinine, Ser: 1.04 mg/dL (ref 0.61–1.24)
GFR, Estimated: >60 mL/min (ref >60)
Glucose, Bld: 197 mg/dL — ABNORMAL HIGH (ref 70–99)
Potassium: 4.2 mmol/L (ref 3.5–5.1)
Sodium: 140 mmol/L (ref 135–145)
Total Bilirubin: 0.9 mg/dL (ref 0.3–1.2)
Total Protein: 6.8 g/dL (ref 6.5–8.1)

## 2022-09-29 LAB — CBC WITH DIFFERENTIAL/PLATELET
Abs Immature Granulocytes: 0.13 10*3/uL — ABNORMAL HIGH (ref 0.00–0.07)
Basophils Absolute: 0.1 10*3/uL (ref 0.0–0.1)
Basophils Relative: 0 %
Eosinophils Absolute: 0 10*3/uL (ref 0.0–0.5)
Eosinophils Relative: 0 %
HCT: 37.7 % — ABNORMAL LOW (ref 39.0–52.0)
Hemoglobin: 12 g/dL — ABNORMAL LOW (ref 13.0–17.0)
Immature Granulocytes: 1 %
Lymphocytes Relative: 4 %
Lymphs Abs: 0.8 10*3/uL (ref 0.7–4.0)
MCH: 30.4 pg (ref 26.0–34.0)
MCHC: 31.8 g/dL (ref 30.0–36.0)
MCV: 95.4 fL (ref 80.0–100.0)
Monocytes Absolute: 1.2 10*3/uL — ABNORMAL HIGH (ref 0.1–1.0)
Monocytes Relative: 5 %
Neutro Abs: 19.5 10*3/uL — ABNORMAL HIGH (ref 1.7–7.7)
Neutrophils Relative %: 90 %
Platelets: 333 10*3/uL (ref 150–400)
RBC: 3.95 MIL/uL — ABNORMAL LOW (ref 4.22–5.81)
RDW: 15 % (ref 11.5–15.5)
WBC: 21.7 10*3/uL — ABNORMAL HIGH (ref 4.0–10.5)
nRBC: 0 % (ref 0.0–0.2)

## 2022-09-29 LAB — PSA: Prostatic Specific Antigen: 20.65 ng/mL — ABNORMAL HIGH (ref 0.00–4.00)

## 2022-09-29 LAB — MAGNESIUM: Magnesium: 1.9 mg/dL (ref 1.7–2.4)

## 2022-09-29 NOTE — Patient Instructions (Addendum)
[facility] at [facility] Discharge Instructions   You were seen and examined today by Dr. Ellin Saba.  He reviewed the results of your PET scan. 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 Dr. Kirtland Bouchard recommends moving on to the next treatment. This will be an infusion that is given in [place]. We will make the referral.   Return as scheduled.    Thank you for choosing [facility] at [facility] to provide your oncology and hematology care.  To afford each patient quality time with our provider, please arrive at least 15 minutes before your scheduled appointment time.   If you have a lab appointment with the Cancer Center please come in thru the Main Entrance and check in at the main information desk.  You need to re-schedule your appointment should you arrive 10 or more minutes late.  We strive to give you quality time with our providers, and arriving late affects you and other patients whose appointments are after yours.  Also, if you no show three or more times for appointments you may be dismissed from the clinic at the providers discretion.     Again, thank you for choosing [facility].  Our hope is that these requests will decrease the amount of time that you wait before being seen by our physicians.       _____________________________________________________________  Should you have questions after your visit to [facility], please contact our office at [phone] and follow the prompts.  Our office hours are 8:00 a.m. and 4:30 p.m. Monday - Friday.  Please note that voicemails left after 4:00 p.m. may not be returned until the following business day.  We are closed weekends and major holidays.  You do have access to a nurse 24-7, just call the main number to the clinic [phone] and do not press any options, hold on the line and a nurse will answer the phone.    For prescription refill requests, have your  pharmacy contact our office and allow 72 hours.    Due to Covid, you will need to wear a mask upon entering the hospital. If you do not have a mask, a mask will be given to you at the Main Entrance upon arrival. For doctor visits, patients may have 1 support person age 16 or older with them. For treatment visits, patients can not have anyone with them due to social distancing guidelines and our immunocompromised population.

## 2022-09-29 NOTE — Progress Notes (Signed)
[facility] [address] [place] [address]    Clinic Day:  09/29/2022  Referring physician: Clinic, [name]  Patient Care Team: Clinic, [name] as PCP - General [name], MD as Medical Oncologist (Medical Oncology) Margaretmary Dys, MD as Consulting Physician (Radiation Oncology)   ASSESSMENT & PLAN:   Assessment: 1.  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 -Abiraterone and prednisone from 07/13/2016 through 04/09/2019, discontinued secondary to PSA progression. -Enzalutamide from 04/12/2019 through 11/03/2019 with progression. -Last Lupron on 06/09/2019. -CT CAP on 11/10/2019 shows no findings of active malignancy.  Stable small sclerotic lesions at T4 and T10, nonspecific.  Right hydronephrosis with right proximal hydroureter extending down to a 0.8 x 0.8 x 0.4 cm proximal ureteral stone at L3 vertical level.  Nonobstructive right and possibly left nephrolithiasis. -Bone scan on 11/10/2019 shows right proximal tibial metaphyseal activity from recent trauma.  Degenerative findings in the spine, right sternoclavicular joint, shoulders and right foot.  No evidence of metastatic disease. -Foundation 1 test shows MS-stable.  AR amplification.  Sensitivity is reduced due to sample quality. -Guardant 360 on 12/12/2019 MSI high not detected.  TMB was not evaluable.  No other targetable mutations. -F-18-PYLARIFY PET scan showed intense radiotracer activity in the left supraclavicular and prevascular lymph nodes, measuring 10 mm.  Cluster of small prevascular lymph nodes measures 5-7 mm each with SUV 54.  AP window lymph node measuring 8 mm.  No activity in the prostate gland or abdominal or pelvic lymph nodes.  No skeletal activity. - SBRT to the left supraclavicular lymph node and prevascular mediastinal  lymph node from 08/27/2020 through 09/06/2020, 40 Gray in 5 fractions. - CT CAP (10/22/2021): Done at [facility]: No lymphadenopathy.  Cirrhosis.  Intrahepatic and extrahepatic biliary ductal dilatation. - Bone scan (10/28/2021): New focal abnormal tracer uptake at the inferior aspect of the right scapula. - PSMA PET scan (11/13/2021): Interval development of multifocal tracer avid bone metastasis, only 1 of these lesions is visible on the recent nuclear medicine bone scan dated 10/28/2021.  New tracer avid right axillary, subcarinal, left paratracheal, left retrocrural and upper abdominal lymph nodes. - 13 cycles of cabazitaxel from 12/15/2021 through 09/08/2022 with progression   2.  Androgen deprivation therapy induced bone loss: -Received Zometa every 3 months for a year completed on 10/29/2017.   3.  Genetic testing: -Germline mutation testing was negative.    Plan: 1.  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 - PSMA PET scan from 09/17/2022 reviewed by me shows progression of prostate cancer nodal metastasis and mild progression of skeletal metastasis. - Recommend biopsy of the right axillary lymph node for NGS testing. - We discussed the Pluvicto treatments. - We also discussed common side effects including myelosuppression, dry mouth and nephrotoxicity. - I will make referral to nuclear medicine Dr. Ty Hilts. - RTC 2 weeks after start of Pluvicto.   2.  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  Continue Xgeva every 6 weeks.   3.  Leg swelling: - Continue Bumex 1 and half tablets daily.  Mild swelling is stable.   4.  Peripheral neuropathy: - Numbness in the feet has been stable.  5.  Hypomagnesemia: - Continue magnesium twice daily.  Magnesium is normal today.    Orders Placed  This Encounter  Procedures   NM NO REPORT/NO CHARGE    Standing Status:   Future    Standing Expiration Date:    09/29/2023      I,Katie Daubenspeck,acting as a scribe for [name], MD.,have documented all relevant documentation on the behalf of [name], MD,as directed by  [name], MD while in the presence of [name], MD.   I, [name] MD, have reviewed the above documentation for accuracy and completeness, and I agree with the above.   [name], MD   5/7/20246:05 PM  CHIEF COMPLAINT:   Diagnosis: metastatic prostate cancer to intrathoracic lymph node    Cancer Staging  No matching staging information was found for the patient.   Prior Therapy: 1. Docetaxel x 14 cycles from 08/11/2015 through 06/12/2016. 2. Abiraterone from 07/13/2016 through 04/09/2019. 3. Enzalutamide from 04/12/2019 to 11/03/2019. 4.  Cabazitaxel  Current Therapy: Pluvicto   HISTORY OF PRESENT ILLNESS:   Oncology History  Prostate cancer metastatic to intrathoracic lymph node (HCC)  07/24/2015 Initial Diagnosis   Prostate cancer metastatic to the left supraclavicular and anterior mediastinal lymph nodes   08/21/2015 - 06/12/2016 Chemotherapy   Docetaxel every 3 weeks for 14 cycles, discontinued as PSA has plateaued around 11    07/13/2016 Treatment Plan Change   Zytiga 1000 milligrams daily along with prednisone 5 mg daily, started secondary to fluid overload from docetaxel   10/14/2018 Genetic Testing   Negative genetic testing.  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  The Common Hereditary Gene Panel offered by [facility] includes sequencing and/or deletion duplication testing of the following 48 genes: APC, ATM, AXIN2, BARD1, BMPR1A, BRCA1, BRCA2, BRIP1, CDH1, CDK4, CDKN2A (p14ARF), CDKN2A (p16INK4a), CHEK2, CTNNA1, DICER1, EPCAM (Deletion/duplication testing only), GREM1 (promoter region deletion/duplication testing only), KIT, MEN1, MLH1, MSH2, MSH3, MSH6, MUTYH, NBN, NF1, NHTL1, PALB2, PDGFRA, PMS2, POLD1, POLE, PTEN, RAD50, RAD51C, RAD51D, RNF43, SDHB,  SDHC, SDHD, SMAD4, SMARCA4. STK11, TP53, TSC1, TSC2, and VHL.  The following genes were evaluated for sequence changes only: SDHA and HOXB13 c.251G>A variant only. The report date is Oct 14, 2018.   09/18/2019 Genetic Testing   Foundation One CDx      12/12/2019 Genetic Testing   Guardant 360       12/15/2021 - 01/06/2022 Chemotherapy   Patient is on Treatment Plan : PROSTATE Cabazitaxel + Prednisone q21d     12/15/2021 -  Chemotherapy   Patient is on Treatment Plan : PROSTATE Cabazitaxel (20) D1 + Prednisone D1-21 q21d        INTERVAL HISTORY:   Andre [name] is a 82 y.o. male presenting to clinic today for follow up of metastatic prostate cancer to intrathoracic lymph node. He was last seen by me on 09/08/22.  Since his last visit, he underwent restaging PSMA PET scan on 09/17/22 showing: progression of nodal metastasis-- new radiotracer-avid right lymph nodes at level 3 cervical, supraclavicular, axillary, and retrocrural; mild progression of skeletal metastasis-- two new radiotracer-avid metastasis in right pelvis.  Today, he states that he is doing well overall. His appetite level is at 100%. His energy level is at 70%.  PAST MEDICAL HISTORY:   Past Medical History: Past Medical History:  Diagnosis Date   COPD (chronic obstructive pulmonary disease) (HCC)    Diabetes mellitus without complication (HCC)    Family history of ovarian cancer    Family history of prostate cancer    Hypertension    Prostate cancer [facility])     Surgical History: Past Surgical  History:  Procedure Laterality Date   GOLD SEED IMPLANT      Social History: Social History   Socioeconomic History   Marital status: Widowed    Spouse name: Not on file   Number of children: Not on file   Years of education: Not on file   Highest education level: Not on file  Occupational History   Not on file  Tobacco Use   Smoking status: Former    Packs/day: 0.25    Years: 60.00    Additional pack years: 0.00     Total pack years: 15.00    Types: Cigarettes    Quit date: 06/25/2014    Years since quitting: 8.2   Smokeless tobacco: Never   Tobacco comments:    smoked since young age, quit in 2016  Substance and Sexual Activity   Alcohol use: Not Currently   Drug use: No   Sexual activity: Not Currently  Other Topics Concern   Not on file  Social History Narrative   Not on file   Social Determinants of Health   Financial Resource Strain: Low Risk  (05/21/2020)   Overall Financial Resource Strain (CARDIA)    Difficulty of Paying Living Expenses: Not hard at all  Food Insecurity: No Food Insecurity (05/21/2020)   Hunger Vital Sign    Worried About Running Out of Food in the Last Year: Never true    Ran Out of Food in the Last Year: Never true  Transportation Needs: No Transportation Needs (05/21/2020)   PRAPARE - Administrator, Civil Service (Medical): No    Lack of Transportation (Non-Medical): No  Physical Activity: Insufficiently Active (05/21/2020)   Exercise Vital Sign    Days of Exercise per Week: 2 days    Minutes of Exercise per Session: 20 min  Stress: No Stress Concern Present (05/21/2020)   [employer] of Occupational Health - Occupational Stress Questionnaire    Feeling of Stress : Not at all  Social Connections: Moderately Isolated (05/21/2020)   Social Connection and Isolation Panel [NHANES]    Frequency of Communication with Friends and Family: More than three times a week    Frequency of Social Gatherings with Friends and Family: More than three times a week    Attends Religious Services: 1 to 4 times per year    Active Member of [employer] or Organizations: No    Attends Banker Meetings: Never    Marital Status: Widowed  Intimate Partner Violence: Not At Risk (05/21/2020)   Humiliation, Afraid, Rape, and Kick questionnaire    Fear of Current or Ex-Partner: No    Emotionally Abused: No    Physically Abused: No    Sexually Abused: No     Family History: Family History  Problem Relation Age of Onset   Ovarian cancer Mother 31   Heart attack Father    Prostate cancer Maternal Uncle 85   Cancer Cousin        pat cousin with unknown cancer   Colon cancer Neg Hx    Pancreatic cancer Neg Hx    Breast cancer Neg Hx     Current Medications:  Current Outpatient Medications:    ACCU-CHEK GUIDE test strip, , Disp: , Rfl:    Accu-Chek Softclix Lancets lancets, , Disp: , Rfl:    acetaminophen (TYLENOL) 500 MG tablet, Take 500 mg by mouth every 6 (six) hours as needed for moderate pain., Disp: , Rfl:    albuterol (ACCUNEB) 0.63 MG/3ML nebulizer  solution, Take 1 ampule by nebulization every 6 (six) hours as needed for wheezing., Disp: , Rfl:    albuterol (PROVENTIL) (2.5 MG/3ML) 0.083% nebulizer solution, INHALE 3 ML IN NEBULIZER BY MOUTH FOUR TIMES A DAY AS NEEDED, Disp: , Rfl:    amLODipine (NORVASC) 2.5 MG tablet, Take 1 tablet (2.5 mg total) by mouth daily as needed., Disp: 30 tablet, Rfl: 0   aspirin 81 MG EC tablet, Take 1 tablet by mouth daily., Disp: , Rfl:    atorvastatin (LIPITOR) 10 MG tablet, TAKE 1 TABLET BY MOUTH EVERY DAY (Patient taking differently: Take 5 mg by mouth daily.), Disp: 90 tablet, Rfl: 3   azelastine (ASTELIN) 0.1 % nasal spray, USE 2 SPRAYS IN EACH NOSTRIL TWICE A DAY FOR ALLERGIC RHINITIS, Disp: , Rfl:    Blood Glucose Monitoring Suppl (ACCU-CHEK GUIDE) w/Device KIT, , Disp: , Rfl:    brompheniramine-pseudoephedrine-DM 30-2-10 MG/5ML syrup, TAKE 5 ML BY MOUTH EVERY 4 HOURS AS NEEDED FOR COUGH, Disp: 120 mL, Rfl: 0   bumetanide (BUMEX) 1 MG tablet, TAKE 1 AND 1/2 TABLETS(1.5 MG) BY MOUTH DAILY, Disp: 45 tablet, Rfl: 6   CALCIUM PO, Take 600 mg by mouth daily. , Disp: , Rfl:    cetirizine (ZYRTEC) 10 MG tablet, Take 1 tablet by mouth daily., Disp: , Rfl:    CHERATUSSIN AC 100-10 MG/5ML syrup, Take 5 mLs by mouth daily as needed. , Disp: , Rfl: 0   Cholecalciferol (VITAMIN D3) 50 MCG (2000 UT)  capsule, Take 2,000 Units by mouth daily., Disp: , Rfl:    cyclobenzaprine (FLEXERIL) 10 MG tablet, Take 10 mg by mouth as needed. , Disp: , Rfl: 0   dicyclomine (BENTYL) 20 MG tablet, Take 1 tablet by mouth 3 (three) times daily as needed., Disp: , Rfl:    dorzolamide (TRUSOPT) 2 % ophthalmic solution, Place 1 drop into both eyes 3 (three) times daily., Disp: , Rfl:    doxycycline (VIBRAMYCIN) 100 MG capsule, Take 100 mg by mouth 2 (two) times daily., Disp: , Rfl:    erythromycin ophthalmic ointment, 3 (three) times daily., Disp: , Rfl:    ferrous sulfate 325 (65 FE) MG tablet, Take 1 tablet by mouth daily., Disp: , Rfl:    fexofenadine (ALLEGRA) 180 MG tablet, Take 1 tablet every day by oral route at bedtime for 90 days., Disp: , Rfl:    fluticasone (FLONASE) 50 MCG/ACT nasal spray, USE 2 SPRAY(S) IN EACH NOSTRIL ONCE DAILY FOR 30 DAYS, Disp: 15.8 mL, Rfl: 12   guaiFENesin-dextromethorphan (ROBITUSSIN DM) 100-10 MG/5ML syrup, Take 5 mLs by mouth every 4 (four) hours as needed for cough., Disp: 118 mL, Rfl: 0   ibuprofen (ADVIL,MOTRIN) 600 MG tablet, Take 600 mg by mouth every 6 (six) hours as needed. , Disp: , Rfl: 0   Ipratropium-Albuterol (COMBIVENT) 20-100 MCG/ACT AERS respimat, INHALE 1 PUFF BY MOUTH FOUR TIMES A DAY AS NEEDED COPD, Disp: , Rfl:    latanoprost (XALATAN) 0.005 % ophthalmic solution, INSTILL 1 DROP INTO AFFECTED EYE(S) BY OPHTHALMIC ROUTE ONCE DAILY INTHE EVENING, Disp: , Rfl:    Latanoprostene Bunod (VYZULTA) 0.024 % SOLN, Apply to eye., Disp: , Rfl:    Leuprolide Acetate, 4 Month, (ELIGARD) 30 MG injection, 30 MG SUBCUTANEOUSLY Q90D PRN, Disp: , Rfl:    losartan (COZAAR) 100 MG tablet, Take 1 tablet by mouth daily., Disp: , Rfl:    magnesium oxide (MAG-OX) 400 MG tablet, Take 1 tablet (400 mg total) by mouth daily. (Patient taking differently:  Take 400 mg by mouth 2 (two) times daily.), Disp: 90 tablet, Rfl: 2   metFORMIN (GLUCOPHAGE) 500 MG tablet, TAKE 2 TABLETS(1000 MG) BY  MOUTH TWICE DAILY WITH A MEAL, Disp: 120 tablet, Rfl: 3   montelukast (SINGULAIR) 10 MG tablet, Take 10 mg by mouth at bedtime., Disp: , Rfl:    Multiple Vitamins-Minerals (PRESERVISION AREDS PO), 1 tablet daily., Disp: , Rfl:    potassium chloride (MICRO-K) 10 MEQ CR capsule, TAKE 4 CAPSULE BY MOUTH DAILY WITH FOOD, OPEN AND SPRINKLE ON FOOD, Disp: 360 capsule, Rfl: 1   predniSONE (DELTASONE) 5 MG tablet, TAKE 1 TABLET BY MOUTH DAILY WITH BREAKFAST, Disp: 30 tablet, Rfl: 3   prochlorperazine (COMPAZINE) 10 MG tablet, Take 1 tablet (10 mg total) by mouth every 6 (six) hours as needed for nausea or vomiting., Disp: 60 tablet, Rfl: 3   sucralfate (CARAFATE) 1 g tablet, Take 1 tablet by mouth 4 (four) times daily as needed., Disp: , Rfl:    TRELEGY ELLIPTA 100-62.5-25 MCG/INH AEPB, INHALE 1 PUFF ONCE DAILY, Disp: , Rfl:    vitamin C (ASCORBIC ACID) 500 MG tablet, Take 500 mg by mouth daily., Disp: , Rfl:  No current facility-administered medications for this visit.  Facility-Administered Medications Ordered in Other Visits:    denosumab (XGEVA) 120 MG/1.7ML injection, , , ,    diphenhydrAMINE (BENADRYL) 50 MG/ML injection, , , ,    famotidine (PEPCID) 20-0.9 MG/50ML-% IVPB, , , ,    Allergies: Allergies  Allergen Reactions   Lisinopril Cough   Metoprolol Other (See Comments) and Cough    Increases frequency of cough Other reaction(s): Other (See Comments) Increases frequency of cough   Semaglutide Nausea And Vomiting    REVIEW OF SYSTEMS:   Review of Systems  Constitutional:  Negative for chills, fatigue and fever.  HENT:   Negative for lump/mass, mouth sores, nosebleeds, sore throat and trouble swallowing.   Eyes:  Negative for eye problems.  Respiratory:  Negative for cough and shortness of breath.   Cardiovascular:  Negative for chest pain, leg swelling and palpitations.  Gastrointestinal:  Negative for abdominal pain, constipation, diarrhea, nausea and vomiting.  Genitourinary:   Negative for bladder incontinence, difficulty urinating, dysuria, frequency, hematuria and nocturia.   Musculoskeletal:  Negative for arthralgias, back pain, flank pain, myalgias and neck pain.  Skin:  Negative for itching and rash.  Neurological:  Positive for numbness. Negative for dizziness and headaches.  Hematological:  Does not bruise/bleed easily.  Psychiatric/Behavioral:  Negative for depression, sleep disturbance and suicidal ideas. The patient is not nervous/anxious.   All other systems reviewed and are negative.    VITALS:   Blood pressure (!) 146/68, pulse 76, temperature 98 F (36.7 C), temperature source Oral, resp. rate 19, SpO2 96 %.  Wt Readings from Last 3 Encounters:  09/08/22 235 lb 3.2 oz (106.7 kg)  08/18/22 234 lb 12.8 oz (106.5 kg)  08/05/22 232 lb (105.2 kg)    There is no height or weight on file to calculate BMI.  Performance status (ECOG): 1 - Symptomatic but completely ambulatory  PHYSICAL EXAM:   Physical Exam Vitals and nursing note reviewed. Exam conducted with a chaperone present.  Constitutional:      Appearance: Normal appearance.  Cardiovascular:     Rate and Rhythm: Normal rate and regular rhythm.     Pulses: Normal pulses.     Heart sounds: Normal heart sounds.  Pulmonary:     Effort: Pulmonary effort is normal.  Breath sounds: Normal breath sounds.  Abdominal:     Palpations: Abdomen is soft. There is no hepatomegaly, splenomegaly or mass.     Tenderness: There is no abdominal tenderness.  Musculoskeletal:     Right lower leg: No edema.     Left lower leg: No edema.  Lymphadenopathy:     Cervical: No cervical adenopathy.     Right cervical: No superficial, deep or posterior cervical adenopathy.    Left cervical: No superficial, deep or posterior cervical adenopathy.     Upper Body:     Right upper body: No supraclavicular or axillary adenopathy.     Left upper body: No supraclavicular or axillary adenopathy.  Neurological:      General: No focal deficit present.     Mental Status: He is alert and oriented to person, place, and time.  Psychiatric:        Mood and Affect: Mood normal.        Behavior: Behavior normal.     LABS:      Latest Ref Rng & Units 09/29/2022    7:42 AM 09/08/2022    8:55 AM 08/18/2022   10:22 AM  CBC  WBC 4.0 - 10.5 K/uL 21.7  12.2  10.4   Hemoglobin 13.0 - 17.0 g/dL 91.4  78.2  95.6   Hematocrit 39.0 - 52.0 % 37.7  34.5  35.3   Platelets 150 - 400 K/uL 333  311  392       Latest Ref Rng & Units 09/29/2022    7:42 AM 09/08/2022    8:55 AM 08/18/2022   10:22 AM  CMP  Glucose 70 - 99 mg/dL 213  086  578   BUN 8 - 23 mg/dL 21  12  15    Creatinine 0.61 - 1.24 mg/dL 4.69  6.29  5.28   Sodium 135 - 145 mmol/L 140  136  139   Potassium 3.5 - 5.1 mmol/L 4.2  3.4  3.2   Chloride 98 - 111 mmol/L 100  103  100   CO2 22 - 32 mmol/L 31  28  28    Calcium 8.9 - 10.3 mg/dL 9.3  8.6  8.3   Total Protein 6.5 - 8.1 g/dL 6.8  6.1  6.2   Total Bilirubin 0.3 - 1.2 mg/dL 0.9  0.6  0.8   Alkaline Phos 38 - 126 U/L 64  57  47   AST 15 - 41 U/L 19  18  18    ALT 0 - 44 U/L 22  18  20       No results found for: "CEA1", "CEA" / No results found for: "CEA1", "CEA" No results found for: "PSA1" No results found for: "UXL244" No results found for: "CAN125"  No results found for: "TOTALPROTELP", "ALBUMINELP", "A1GS", "A2GS", "BETS", "BETA2SER", "GAMS", "MSPIKE", "SPEI" Lab Results  Component Value Date   TIBC 323 05/20/2022   TIBC 325 12/15/2021   TIBC 268 10/15/2021   FERRITIN 133 05/20/2022   FERRITIN 117 12/15/2021   FERRITIN 330 10/15/2021   IRONPCTSAT 12 (L) 05/20/2022   IRONPCTSAT 10 (L) 12/15/2021   IRONPCTSAT 23 10/15/2021   No results found for: "LDH"   STUDIES:   NM PET (PSMA) SKULL TO MID THIGH  Result Date: 09/23/2022 CLINICAL DATA:  Metastatic prostate cancer to bone lymph nodes. Chemotherapy 2 weeks prior. EXAM: NUCLEAR MEDICINE PET SKULL BASE TO THIGH TECHNIQUE: 8.8 mCi F18  Piflufolastat (Pylarify) was injected intravenously. Full-ring PET imaging was performed from the  skull base to thigh after the radiotracer. CT data was obtained and used for attenuation correction and anatomic localization. COMPARISON:  PSMA PET scan 11/13/2021 FINDINGS: NECK Several new RIGHT supraclavicular intensely radiotracer avid lymph nodes are present. Lymph nodes are very small difficult to measure on CT but intensely radiotracer avid with SUV max equal 16.1 on image 154. A tiny node superior to the RIGHT clavicle and the acromion process measures less than 5 mm (image 154) with SUV max equal 17. Superiorly there are 2 adjacent intense radiotracer avid nodes at the RIGHT level 3 nodal station along the posterior margin of the RIGHT sternocleidomastoid muscle which measure 5 mm. (Image 132) Incidental CT finding: None. CHEST Subcarinal lymph node is less than 10 mm with SUV max equal 7.8 which is increased from comparison exam (4.3). Prevascular lymph nodes are not significantly radiotracer avid. New intensely radiotracer avid RIGHT axillary lymph node. Incidental CT finding: No suspicious pulmonary nodules. ABDOMEN/PELVIS Prostate: No focal activity the prostate gland. Brachytherapy seeds noted. Lymph nodes: No abnormal radiotracer accumulation within pelvic or abdominal nodes. New LEFT retrocrural nodes intensely radiotracer avid with SUV max equal 8.6 on image 167. This node measures approximately 2 mm short axis Liver: No evidence of liver metastasis. Incidental CT finding: None. SKELETON Again demonstrated multifocal intense radiotracer avid skeletal metastasis unchanged from prior. Example lesion in the RIGHT scapula with SUV max equal 17.9. Example lesion at L1 with SUV max equal 18. Unchanged from prior. New lesion within the RIGHT sacral ala with SUV max equal 16.5. New lesion in RIGHT iliac bone with SUV max equal 18. There is subtle sclerotic change associated with these lesions. IMPRESSION: 1.  Progression of prostate cancer nodal metastasis. Mild progression of skeletal metastasis. 2. New radiotracer avid RIGHT level III cervical lymph nodes, RIGHT supraclavicular lymph nodes, RIGHT axillary lymph node, and RIGHT retrocrural lymph nodes. 3. Two new radiotracer avid skeletal metastasis in the RIGHT pelvis. Stable additional multifocal skeletal metastasis. 4. Additional previously identified multifocal radiotracer avid skeletal metastasis unchanged from prior. Electronically Signed   By: Genevive Bi M.D.   On: 09/23/2022 09:08   NM Bone Scan Whole Body  Result Date: 09/03/2022 CLINICAL DATA:  Prostate cancer EXAM: NUCLEAR MEDICINE WHOLE BODY BONE SCAN TECHNIQUE: Whole body anterior and posterior images were obtained approximately 3 hours after intravenous injection of radiopharmaceutical. RADIOPHARMACEUTICALS:  20.5 mCi Technetium-83m MDP IV COMPARISON:  CT from 08/14/2022 FINDINGS: Bilateral shoulder and sternomanubrial activity probably degenerative findings. Focal activity identified in the inferior scapula on the right corresponding to sclerotic osseous metastatic disease. There is no significant activity observed at the L1 level and correspond to the L1 lesion. There is bilateral renal activity. IMPRESSION: 1. Focal activity tip of the right scapula corresponding to sclerotic osseous metastatic disease. 2. No activity identified to correspond to an L1 lesion observed on CT. 3. Bilateral shoulder and sternomanubrial activity which are likely to be degenerative findings. Electronically Signed   By: Layla Maw M.D.   On: 09/03/2022 07:59

## 2022-10-06 ENCOUNTER — Inpatient Hospital Stay: Payer: Medicare PPO

## 2022-10-06 ENCOUNTER — Inpatient Hospital Stay: Payer: Medicare PPO | Admitting: Hematology

## 2022-10-07 ENCOUNTER — Encounter (HOSPITAL_COMMUNITY): Payer: Self-pay | Admitting: Hematology

## 2022-10-07 ENCOUNTER — Emergency Department (HOSPITAL_COMMUNITY)
Admission: EM | Admit: 2022-10-07 | Discharge: 2022-10-08 | Disposition: A | Discharge status: Home / Self Care | Payer: No Typology Code available for payment source | Attending: Emergency Medicine | Admitting: Emergency Medicine

## 2022-10-07 ENCOUNTER — Encounter (HOSPITAL_COMMUNITY)
Admission: RE | Admit: 2022-10-07 | Discharge: 2022-10-07 | Disposition: A | Discharge status: Home / Self Care | Payer: Medicare PPO | Source: Ambulatory Visit | Attending: Hematology | Admitting: Hematology

## 2022-10-07 ENCOUNTER — Other Ambulatory Visit: Payer: Self-pay

## 2022-10-07 ENCOUNTER — Other Ambulatory Visit (HOSPITAL_COMMUNITY): Payer: Self-pay | Admitting: Diagnostic Radiology

## 2022-10-07 ENCOUNTER — Emergency Department (HOSPITAL_COMMUNITY): Payer: No Typology Code available for payment source

## 2022-10-07 ENCOUNTER — Ambulatory Visit (HOSPITAL_COMMUNITY)
Admission: RE | Admit: 2022-10-07 | Discharge: 2022-10-07 | Disposition: A | Discharge status: Home / Self Care | Payer: Medicare PPO | Source: Ambulatory Visit | Attending: Diagnostic Radiology | Admitting: Diagnostic Radiology

## 2022-10-07 ENCOUNTER — Encounter (HOSPITAL_COMMUNITY): Payer: Self-pay | Admitting: Emergency Medicine

## 2022-10-07 ENCOUNTER — Encounter: Payer: Self-pay | Admitting: Hematology

## 2022-10-07 DIAGNOSIS — J449 Chronic obstructive pulmonary disease, unspecified: Secondary | ICD-10-CM | POA: Insufficient documentation

## 2022-10-07 DIAGNOSIS — Z87891 Personal history of nicotine dependence: Secondary | ICD-10-CM | POA: Diagnosis not present

## 2022-10-07 DIAGNOSIS — E1165 Type 2 diabetes mellitus with hyperglycemia: Secondary | ICD-10-CM | POA: Insufficient documentation

## 2022-10-07 DIAGNOSIS — Z79899 Other long term (current) drug therapy: Secondary | ICD-10-CM | POA: Diagnosis not present

## 2022-10-07 DIAGNOSIS — Z7951 Long term (current) use of inhaled steroids: Secondary | ICD-10-CM | POA: Insufficient documentation

## 2022-10-07 DIAGNOSIS — C61 Malignant neoplasm of prostate: Secondary | ICD-10-CM

## 2022-10-07 DIAGNOSIS — Z7984 Long term (current) use of oral hypoglycemic drugs: Secondary | ICD-10-CM | POA: Insufficient documentation

## 2022-10-07 DIAGNOSIS — R0602 Shortness of breath: Secondary | ICD-10-CM | POA: Diagnosis not present

## 2022-10-07 DIAGNOSIS — D72829 Elevated white blood cell count, unspecified: Secondary | ICD-10-CM | POA: Diagnosis not present

## 2022-10-07 DIAGNOSIS — Z8546 Personal history of malignant neoplasm of prostate: Secondary | ICD-10-CM | POA: Diagnosis not present

## 2022-10-07 DIAGNOSIS — Z7982 Long term (current) use of aspirin: Secondary | ICD-10-CM | POA: Insufficient documentation

## 2022-10-07 DIAGNOSIS — C771 Secondary and unspecified malignant neoplasm of intrathoracic lymph nodes: Secondary | ICD-10-CM | POA: Insufficient documentation

## 2022-10-07 DIAGNOSIS — I10 Essential (primary) hypertension: Secondary | ICD-10-CM | POA: Insufficient documentation

## 2022-10-07 DIAGNOSIS — R051 Acute cough: Secondary | ICD-10-CM | POA: Diagnosis present

## 2022-10-07 LAB — BASIC METABOLIC PANEL
Anion gap: 7 (ref 5–15)
BUN: 13 mg/dL (ref 8–23)
CO2: 29 mmol/L (ref 22–32)
Calcium: 9 mg/dL (ref 8.9–10.3)
Chloride: 103 mmol/L (ref 98–111)
Creatinine, Ser: 0.91 mg/dL (ref 0.61–1.24)
GFR, Estimated: >60 mL/min (ref >60)
Glucose, Bld: 180 mg/dL — ABNORMAL HIGH (ref 70–99)
Potassium: 4 mmol/L (ref 3.5–5.1)
Sodium: 139 mmol/L (ref 135–145)

## 2022-10-07 LAB — CBC
HCT: 35.4 % — ABNORMAL LOW (ref 39.0–52.0)
Hemoglobin: 11.1 g/dL — ABNORMAL LOW (ref 13.0–17.0)
MCH: 30.3 pg (ref 26.0–34.0)
MCHC: 31.4 g/dL (ref 30.0–36.0)
MCV: 96.7 fL (ref 80.0–100.0)
Platelets: 322 10*3/uL (ref 150–400)
RBC: 3.66 MIL/uL — ABNORMAL LOW (ref 4.22–5.81)
RDW: 14.4 % (ref 11.5–15.5)
WBC: 13.4 10*3/uL — ABNORMAL HIGH (ref 4.0–10.5)
nRBC: 0 % (ref 0.0–0.2)

## 2022-10-07 LAB — CBG MONITORING, ED: Glucose-Capillary: 234 mg/dL — ABNORMAL HIGH (ref 70–99)

## 2022-10-07 MED ORDER — IPRATROPIUM BROMIDE 0.02 % IN SOLN
0.5000 mg | Freq: Once | RESPIRATORY_TRACT | Status: AC
  Administered 2022-10-07: 0.5 mg via RESPIRATORY_TRACT
  Filled 2022-10-07: qty 2.5

## 2022-10-07 MED ORDER — ALBUTEROL SULFATE (2.5 MG/3ML) 0.083% IN NEBU
5.0000 mg | INHALATION_SOLUTION | Freq: Once | RESPIRATORY_TRACT | Status: AC
  Administered 2022-10-07: 5 mg via RESPIRATORY_TRACT
  Filled 2022-10-07: qty 6

## 2022-10-07 NOTE — ED Notes (Signed)
Pt having no SOB   states he only gets SOB when he is coughing

## 2022-10-07 NOTE — ED Triage Notes (Signed)
Pt a/o. here with daughter. C/o wet cough with white cloudy mucus today. Pt only c/o sob when he gets choked on the phlegm and trying to get it out. No resp distress/sob noted. Pt denies sob at this time. Able to finish sentences. Just finished abx for congestion/bronchitis last week

## 2022-10-07 NOTE — Consult Note (Signed)
Chief Complaint: Patient with metastatic prostate cancer  evaluation for Lu177 PSMA therapy ( vipivotide tetraxetan )    Referring Physician(s):Katragada  Patient Status: [facility] - Out-pt  History of Present Illness: Andre [name] is a 82 y.o. 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  Patient has  undergone directed radiotherapy to lymph nodes as well as taxane chemotherapy for prostate cancer metastasis.  Patient demonstrates recent progression prostate carcinoma with elevated PSA and new metastatic lesion the bone and lymph nodes on PSMA PET scan 09/17/2022]  Past Medical History:  Diagnosis Date   COPD (chronic obstructive pulmonary disease) (HCC)    Diabetes mellitus without complication (HCC)    Family history of ovarian cancer    Family history of prostate cancer    Hypertension    Prostate cancer [facility])     Past Surgical History:  Procedure Laterality Date   GOLD SEED IMPLANT      Allergies: Lisinopril, Metoprolol, and Semaglutide  Medications: Prior to Admission medications   Medication Sig Start Date End Date Taking? Authorizing Provider  ACCU-CHEK GUIDE test strip  06/12/19   [provider]  Accu-Chek Softclix Lancets lancets  06/12/19   [provider]  acetaminophen (TYLENOL) 500 MG tablet Take 500 mg by mouth every 6 (six) hours as needed for moderate pain.    [provider]  albuterol (ACCUNEB) 0.63 MG/3ML nebulizer solution Take 1 ampule by nebulization every 6 (six) hours as needed for wheezing.    [provider]  albuterol (PROVENTIL) (2.5 MG/3ML) 0.083% nebulizer solution INHALE 3 ML IN NEBULIZER BY MOUTH FOUR TIMES A DAY AS NEEDED 02/27/21   [provider]  amLODipine (NORVASC) 2.5 MG tablet Take 1 tablet (2.5 mg total) by mouth daily as needed. 05/04/19   [name], MD  aspirin 81 MG EC tablet Take 1 tablet by  mouth daily. 02/27/21   [provider]  atorvastatin (LIPITOR) 10 MG tablet TAKE 1 TABLET BY MOUTH EVERY DAY Patient taking differently: Take 5 mg by mouth daily. 01/27/22   [name], MD  azelastine (ASTELIN) 0.1 % nasal spray USE 2 SPRAYS IN EACH NOSTRIL TWICE A DAY FOR ALLERGIC RHINITIS 10/07/21   [provider]  Blood Glucose Monitoring Suppl (ACCU-CHEK GUIDE) w/Device KIT  06/12/19   [provider]  brompheniramine-pseudoephedrine-DM 30-2-10 MG/5ML syrup TAKE 5 ML BY MOUTH EVERY 4 HOURS AS NEEDED FOR COUGH 07/02/22   [name], MD  bumetanide (BUMEX) 1 MG tablet TAKE 1 AND 1/2 TABLETS(1.5 MG) BY MOUTH DAILY 06/23/22   [name], MD  CALCIUM PO Take 600 mg by mouth daily.     [provider]  cetirizine (ZYRTEC) 10 MG tablet Take 1 tablet by mouth daily. 02/27/21   [provider]  CHERATUSSIN AC 100-10 MG/5ML syrup Take 5 mLs by mouth daily as needed.  08/12/17   [provider]  Cholecalciferol (VITAMIN D3) 50 MCG (2000 UT) capsule Take 2,000 Units by mouth daily. 05/11/22   [provider]  cyclobenzaprine (FLEXERIL) 10 MG tablet Take 10 mg by mouth as needed.  10/12/17   [provider]  dicyclomine (BENTYL) 20 MG tablet Take 1 tablet by mouth 3 (three) times daily as needed. 02/27/21   [provider]  dorzolamide (TRUSOPT) 2 % ophthalmic solution Place 1 drop into both eyes 3 (three) times daily. 06/04/15   [provider]  doxycycline (VIBRAMYCIN) 100 MG capsule  Take 100 mg by mouth 2 (two) times daily. 06/04/22   [provider]  erythromycin ophthalmic ointment 3 (three) times daily. 09/03/22   [provider]  ferrous sulfate 325 (65 FE) MG tablet Take 1 tablet by mouth daily. 02/27/21   [provider]  fexofenadine (ALLEGRA) 180 MG tablet Take 1 tablet every day by oral route at bedtime for 90 days. 05/07/22   [provider]  fluticasone  (FLONASE) 50 MCG/ACT nasal spray USE 2 SPRAY(S) IN EACH NOSTRIL ONCE DAILY FOR 30 DAYS 08/11/21   [name], MD  guaiFENesin-dextromethorphan (ROBITUSSIN DM) 100-10 MG/5ML syrup Take 5 mLs by mouth every 4 (four) hours as needed for cough. 08/05/22   Burgess Amor, PA-C  ibuprofen (ADVIL,MOTRIN) 600 MG tablet Take 600 mg by mouth every 6 (six) hours as needed.  10/12/17   [provider]  Ipratropium-Albuterol (COMBIVENT) 20-100 MCG/ACT AERS respimat INHALE 1 PUFF BY MOUTH FOUR TIMES A DAY AS NEEDED COPD 09/23/21   [provider]  latanoprost (XALATAN) 0.005 % ophthalmic solution INSTILL 1 DROP INTO AFFECTED EYE(S) BY OPHTHALMIC ROUTE ONCE DAILY INTHE EVENING    [provider]  Latanoprostene Bunod (VYZULTA) 0.024 % SOLN Apply to eye.    [provider]  Leuprolide Acetate, 4 Month, (ELIGARD) 30 MG injection 30 MG SUBCUTANEOUSLY Q90D PRN 02/27/21   [provider]  losartan (COZAAR) 100 MG tablet Take 1 tablet by mouth daily. 02/27/21   [provider]  magnesium oxide (MAG-OX) 400 MG tablet Take 1 tablet (400 mg total) by mouth daily. Patient taking differently: Take 400 mg by mouth 2 (two) times daily. 03/10/22   [name], MD  metFORMIN (GLUCOPHAGE) 500 MG tablet TAKE 2 TABLETS(1000 MG) BY MOUTH TWICE DAILY WITH A MEAL 06/08/22   [name], MD  montelukast (SINGULAIR) 10 MG tablet Take 10 mg by mouth at bedtime.    [provider]  Multiple Vitamins-Minerals (PRESERVISION AREDS PO) 1 tablet daily.    [provider]  potassium chloride (MICRO-K) 10 MEQ CR capsule TAKE 4 CAPSULE BY MOUTH DAILY WITH FOOD, OPEN AND SPRINKLE ON FOOD 09/16/22   [name], MD  predniSONE (DELTASONE) 5 MG tablet TAKE 1 TABLET BY MOUTH DAILY WITH BREAKFAST 08/28/22   [name], MD  prochlorperazine (COMPAZINE) 10 MG tablet Take 1 tablet (10 mg total) by mouth every 6 (six) hours as needed for nausea or  vomiting. 12/14/21   [name], MD  sucralfate (CARAFATE) 1 g tablet Take 1 tablet by mouth 4 (four) times daily as needed. 02/27/21   [provider]  Dwyane Luo 100-62.5-25 MCG/INH AEPB INHALE 1 PUFF ONCE DAILY 09/08/18   [provider]  vitamin C (ASCORBIC ACID) 500 MG tablet Take 500 mg by mouth daily.    [provider]     Family History  Problem Relation Age of Onset   Ovarian cancer Mother 76   Heart attack Father    Prostate cancer Maternal Uncle 89   Cancer Cousin        pat cousin with unknown cancer   Colon cancer Neg Hx    Pancreatic cancer Neg Hx    Breast cancer Neg Hx     Social History   Socioeconomic History   Marital status: Widowed    Spouse name: Not on file   Number of children: Not on file   Years of education: Not on file   Highest education level: Not on file  Occupational History  Not on file  Tobacco Use   Smoking status: Former    Packs/day: 0.25    Years: 60.00    Additional pack years: 0.00    Total pack years: 15.00    Types: Cigarettes    Quit date: 06/25/2014    Years since quitting: 8.2   Smokeless tobacco: Never   Tobacco comments:    smoked since young age, quit in 2016  Substance and Sexual Activity   Alcohol use: Not Currently   Drug use: No   Sexual activity: Not Currently  Other Topics Concern   Not on file  Social History Narrative   Not on file   Social Determinants of Health   Financial Resource Strain: Low Risk  (05/21/2020)   Overall Financial Resource Strain (CARDIA)    Difficulty of Paying Living Expenses: Not hard at all  Food Insecurity: No Food Insecurity (05/21/2020)   Hunger Vital Sign    Worried About Running Out of Food in the Last Year: Never true    Ran Out of Food in the Last Year: Never true  Transportation Needs: No Transportation Needs (05/21/2020)   PRAPARE - Administrator, Civil Service (Medical): No    Lack of Transportation (Non-Medical): No   Physical Activity: Insufficiently Active (05/21/2020)   Exercise Vital Sign    Days of Exercise per Week: 2 days    Minutes of Exercise per Session: 20 min  Stress: No Stress Concern Present (05/21/2020)   [employer] of Occupational Health - Occupational Stress Questionnaire    Feeling of Stress : Not at all  Social Connections: Moderately Isolated (05/21/2020)   Social Connection and Isolation Panel [NHANES]    Frequency of Communication with Friends and Family: More than three times a week    Frequency of Social Gatherings with Friends and Family: More than three times a week    Attends Religious Services: 1 to 4 times per year    Active Member of [employer] or Organizations: No    Attends Banker Meetings: Never    Marital Status: Widowed    ECOG Status: 1 - Symptomatic but completely ambulatory  Review of Systems: A 12 point ROS discussed and pertinent positives are indicated in the HPI above.  All other systems are negative.  Review of Systems  No bone pain.  No bruising, fever or fatigue.  No incontinence  Vital Signs: There were no vitals taken for this visit.  Physical Exam  In wheel chair.    Imaging: No results found.  Labs:  CBC: Recent Labs    08/05/22 1543 08/05/22 1548 08/18/22 1022 09/08/22 0855 09/29/22 0742  WBC 19.9*  --  10.4 12.2* 21.7*  HGB 11.4* 12.6* 11.1* 10.8* 12.0*  HCT 36.5* 37.0* 35.3* 34.5* 37.7*  PLT PLATELET CLUMPS NOTED ON SMEAR, COUNT APPEARS ADEQUATE  --  [phone]    COAGS: No results for input(s): "INR", "APTT" in the last 8760 hours.  BMP: Recent Labs    08/05/22 1617 08/18/22 1022 09/08/22 0855 09/29/22 0742  NA 142 139 136 140  K 3.4* 3.2* 3.4* 4.2  CL 100 100 103 100  CO2 30 28 28 31   GLUCOSE 133* 121* 169* 197*  BUN 14 15 12 21   CALCIUM 9.0 8.3* 8.6* 9.3  CREATININE 1.13 0.79 0.99 1.04  GFRNONAA >60 >60 >60 >60    LIVER FUNCTION TESTS: Recent Labs    08/05/22 1617 08/18/22 1022  09/08/22 0855 09/29/22 0742  BILITOT 1.0  0.8 0.6 0.9  AST 18 18 18 19   ALT 15 20 18 22   ALKPHOS 89 47 57 64  PROT 6.4* 6.2* 6.1* 6.8  ALBUMIN 3.7 3.7 3.6 4.0    TUMOR MARKERS: No results for input(s): "AFPTM", "CEA", "CA199", "CHROMOGA" in the last 8760 hours.  PSA  20.65 High      Assessment and Plan:  [Patient is adequate candidate Lu 177 PSMA therapy ( vipivotide tetraxetan).  Patient demonstrates mild-to-moderate progression metastatic lymphadenopathy and skeletal disease identified on recent PSMA PET scan (09/17/2022).  Additionally patient's PSA is gradually increasing and currently 20.5.  Patient has demonstrated progression on androgen deprivation and taxane chemotherapy.  Patient explained major and minor risks and benefits of therapy.  Major benefit being progression-free survival.  Major risk being myelosuppression and renal toxicity.  All the patient's questions were answered.  Patient accompanied by daughter who was also present for consult.   Patient is scheduled for 6 treatments spaced 6 weeks apart.  Recommend following up with oncologist for CBC and CMP 1 week prior to each treatment to assess safety of continuing with therapy.   Thank you for this interesting consult.  I greatly enjoyed meeting KRISTINE TIGNOR and look forward to participating in their care.  A copy of this report was sent to the requesting provider on this date.  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

## 2022-10-08 ENCOUNTER — Other Ambulatory Visit (HOSPITAL_COMMUNITY): Payer: Medicare PPO

## 2022-10-08 MED ORDER — AMOXICILLIN-POT CLAVULANATE 875-125 MG PO TABS: 1.0000 | ORAL_TABLET | Freq: Two times a day (BID) | ORAL | 0 refills | Status: DC

## 2022-10-08 MED ORDER — DOXYCYCLINE HYCLATE 100 MG PO CAPS: 100.0000 mg | ORAL_CAPSULE | Freq: Two times a day (BID) | ORAL | 0 refills | Status: DC

## 2022-10-08 MED ORDER — DOXYCYCLINE HYCLATE 100 MG PO TABS
100.0000 mg | ORAL_TABLET | Freq: Once | ORAL | Status: AC
  Administered 2022-10-08: 100 mg via ORAL
  Filled 2022-10-08: qty 1

## 2022-10-08 MED ORDER — AMOXICILLIN-POT CLAVULANATE 875-125 MG PO TABS
1.0000 | ORAL_TABLET | Freq: Once | ORAL | Status: AC
  Administered 2022-10-08: 1 via ORAL
  Filled 2022-10-08: qty 1

## 2022-10-08 NOTE — ED Provider Notes (Signed)
[place] EMERGENCY DEPARTMENT AT [facility] Provider Note   CSN: [id number] Arrival date & time: 10/07/22  2134     History  Chief Complaint  Patient presents with   Cough    Andre [name] is a 82 y.o. male.  The history is provided by the patient.   Patient history of COPD, diabetes, prostate cancer without active chemo treatment presents with cough and shortness of breath.  Patient has had intermittent cough for several months.  He recently completed a course of antibiotics-Z-Pak Earlier tonight patient ate [employer] and soon after started to cough. Also reports shortness of breath when he coughs.  Daughter describes the cough is wet and with white mucus.  No hemoptysis.  No chest pain.  Patient reports he feels well when he is not coughing. He is a former smoker.  Has a history of COPD and uses albuterol intermittently. No new lower extremity edema  He does not get choked or have difficulty swallowing when eating Past Medical History:  Diagnosis Date   COPD (chronic obstructive pulmonary disease) (HCC)    Diabetes mellitus without complication (HCC)    Family history of ovarian cancer    Family history of prostate cancer    Hypertension    Prostate cancer (HCC)     Home Medications Prior to Admission medications   Medication Sig Start Date End Date Taking? Authorizing Provider  amoxicillin-clavulanate (AUGMENTIN) 875-125 MG tablet Take 1 tablet by mouth every 12 (twelve) hours. 10/08/22  Yes Zadie Rhine, MD  doxycycline (VIBRAMYCIN) 100 MG capsule Take 1 capsule (100 mg total) by mouth 2 (two) times daily. 10/08/22  Yes Zadie Rhine, MD  ACCU-CHEK GUIDE test strip  06/12/19   [provider]  Accu-Chek Softclix Lancets lancets  06/12/19   [provider]  acetaminophen (TYLENOL) 500 MG tablet Take 500 mg by mouth every 6 (six) hours as needed for moderate pain.    [provider]  albuterol (ACCUNEB) 0.63 MG/3ML nebulizer  solution Take 1 ampule by nebulization every 6 (six) hours as needed for wheezing.    [provider]  albuterol (PROVENTIL) (2.5 MG/3ML) 0.083% nebulizer solution INHALE 3 ML IN NEBULIZER BY MOUTH FOUR TIMES A DAY AS NEEDED 02/27/21   [provider]  amLODipine (NORVASC) 2.5 MG tablet Take 1 tablet (2.5 mg total) by mouth daily as needed. 05/04/19   [name], MD  aspirin 81 MG EC tablet Take 1 tablet by mouth daily. 02/27/21   [provider]  atorvastatin (LIPITOR) 10 MG tablet TAKE 1 TABLET BY MOUTH EVERY DAY Patient taking differently: Take 5 mg by mouth daily. 01/27/22   [name], MD  azelastine (ASTELIN) 0.1 % nasal spray USE 2 SPRAYS IN EACH NOSTRIL TWICE A DAY FOR ALLERGIC RHINITIS 10/07/21   [provider]  Blood Glucose Monitoring Suppl (ACCU-CHEK GUIDE) w/Device KIT  06/12/19   [provider]  brompheniramine-pseudoephedrine-DM 30-2-10 MG/5ML syrup TAKE 5 ML BY MOUTH EVERY 4 HOURS AS NEEDED FOR COUGH 07/02/22   [name], MD  bumetanide (BUMEX) 1 MG tablet TAKE 1 AND 1/2 TABLETS(1.5 MG) BY MOUTH DAILY 06/23/22   [name], MD  CALCIUM PO Take 600 mg by mouth daily.     [provider]  cetirizine (ZYRTEC) 10 MG tablet Take 1 tablet by mouth daily. 02/27/21   [provider]  CHERATUSSIN AC 100-10 MG/5ML syrup Take 5 mLs by mouth daily as needed.  08/12/17   [provider]  Cholecalciferol (VITAMIN D3) 50 MCG (2000 UT) capsule Take 2,000 Units by mouth daily. 05/11/22   [provider]  dicyclomine (BENTYL) 20 MG tablet Take 1 tablet by mouth 3 (three) times daily as needed. 02/27/21   [provider]  dorzolamide (TRUSOPT) 2 % ophthalmic solution Place 1 drop into both eyes 3 (three) times daily. 06/04/15   [provider]  erythromycin ophthalmic ointment 3 (three) times daily. 09/03/22   [provider]  ferrous sulfate 325 (65 FE) MG tablet  Take 1 tablet by mouth daily. 02/27/21   [provider]  fexofenadine (ALLEGRA) 180 MG tablet Take 1 tablet every day by oral route at bedtime for 90 days. 05/07/22   [provider]  fluticasone (FLONASE) 50 MCG/ACT nasal spray USE 2 SPRAY(S) IN EACH NOSTRIL ONCE DAILY FOR 30 DAYS 08/11/21   [name], MD  ibuprofen (ADVIL,MOTRIN) 600 MG tablet Take 600 mg by mouth every 6 (six) hours as needed.  10/12/17   [provider]  Ipratropium-Albuterol (COMBIVENT) 20-100 MCG/ACT AERS respimat INHALE 1 PUFF BY MOUTH FOUR TIMES A DAY AS NEEDED COPD 09/23/21   [provider]  latanoprost (XALATAN) 0.005 % ophthalmic solution INSTILL 1 DROP INTO AFFECTED EYE(S) BY OPHTHALMIC ROUTE ONCE DAILY INTHE EVENING    [provider]  Latanoprostene Bunod (VYZULTA) 0.024 % SOLN Apply to eye.    [provider]  Leuprolide Acetate, 4 Month, (ELIGARD) 30 MG injection 30 MG SUBCUTANEOUSLY Q90D PRN 02/27/21   [provider]  losartan (COZAAR) 100 MG tablet Take 1 tablet by mouth daily. 02/27/21   [provider]  magnesium oxide (MAG-OX) 400 MG tablet Take 1 tablet (400 mg total) by mouth daily. Patient taking differently: Take 400 mg by mouth 2 (two) times daily. 03/10/22   [name], MD  metFORMIN (GLUCOPHAGE) 500 MG tablet TAKE 2 TABLETS(1000 MG) BY MOUTH TWICE DAILY WITH A MEAL 06/08/22   [name], MD  montelukast (SINGULAIR) 10 MG tablet Take 10 mg by mouth at bedtime.    [provider]  Multiple Vitamins-Minerals (PRESERVISION AREDS PO) 1 tablet daily.    [provider]  potassium chloride (MICRO-K) 10 MEQ CR capsule TAKE 4 CAPSULE BY MOUTH DAILY WITH FOOD, OPEN AND SPRINKLE ON FOOD 09/16/22   [name], MD  predniSONE (DELTASONE) 5 MG tablet TAKE 1 TABLET BY MOUTH DAILY WITH BREAKFAST 08/28/22   [name], MD  prochlorperazine (COMPAZINE) 10 MG tablet Take 1 tablet (10 mg total)  by mouth every 6 (six) hours as needed for nausea or vomiting. 12/14/21   [name], MD  sucralfate (CARAFATE) 1 g tablet Take 1 tablet by mouth 4 (four) times daily as needed. 02/27/21   [provider]  Dwyane Luo 100-62.5-25 MCG/INH AEPB INHALE 1 PUFF ONCE DAILY 09/08/18   [provider]  vitamin C (ASCORBIC ACID) 500 MG tablet Take 500 mg by mouth daily.    [provider]      Allergies    Lisinopril, Metoprolol, and Semaglutide    Review of Systems   Review of Systems  Constitutional:  Negative for fever.  Respiratory:  Positive for cough.     Physical Exam Updated Vital Signs BP 137/86 (BP Location: Right Arm)   Pulse 70   Temp 98.8 F (37.1 C) (Oral)   Resp 19   SpO2 97%  Physical Exam CONSTITUTIONAL: Elderly but no acute distress HEAD: Normocephalic/atraumatic EYES: EOMI/PERRL ENMT: Mucous membranes moist, uvula midline, no erythema  or exudate, no stridor, no drooling, no dysphonia NECK: supple no meningeal signs, no JVD SPINE/BACK:entire spine nontender CV: S1/S2 noted, no murmurs/rubs/gallops noted LUNGS: Coarse breath sounds noted in the bases, scattered wheeze ABDOMEN: soft, nontender NEURO: Pt is awake/alert/appropriate, moves all extremitiesx4.  No facial droop.   EXTREMITIES: pulses normal/equal, full ROM, no lower extremity edema or tenderness SKIN: warm, color normal PSYCH: no abnormalities of mood noted, alert and oriented to situation  ED Results / Procedures / Treatments   Labs (all labs ordered are listed, but only abnormal results are displayed) Labs Reviewed  CBC - Abnormal; Notable for the following components:      Result Value   WBC 13.4 (*)    RBC 3.66 (*)    Hemoglobin 11.1 (*)    HCT 35.4 (*)    All other components within normal limits  BASIC METABOLIC PANEL - Abnormal; Notable for the following components:   Glucose, Bld 180 (*)    All other components within normal limits  CBG MONITORING, ED  - Abnormal; Notable for the following components:   Glucose-Capillary 234 (*)    All other components within normal limits    EKG None  Radiology DG Chest 2 View  Result Date: 10/07/2022 CLINICAL DATA:  Cough EXAM: CHEST - 2 VIEW COMPARISON:  08/05/2022, CT 08/14/2022, 02/18/2022 FINDINGS: Right-sided central venous port tip at the SVC. Elevated left diaphragm. Small amount of left pleural effusion or thickening with mild airspace disease at the left base, no significant change as compared with March 2024. Tortuous ectatic aorta. 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 IMPRESSION: No significant radiographic change in appearance of the chest since 08/05/2018 for comparison radiograph. Elevated left diaphragm with small left effusion and mild airspace disease at left base. Electronically Signed   By: Jasmine Pang M.D.   On: 10/07/2022 22:20   NM Radiologist Eval And Mgmt  Result Date: 10/07/2022 EXAM: NEW PATIENT OFFICE VISIT CHIEF COMPLAINT: Prostate carcinoma. 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 Patient has undergone directed radiotherapy to lymph nodes as well as taxane chemotherapy for prostate cancer metastasis. Patient demonstrates recent progression prostate carcinoma with elevated PSA and new metastatic lesion the bone and lymph nodes on PSMA PET scan 09/17/2022 Current Pain Level: 1-10 HISTORY OF PRESENT ILLNESS: See epic note REVIEW OF SYSTEMS: See epic note PHYSICAL EXAMINATION: See epic note ASSESSMENT AND PLAN: Patient is adequate candidate Lu 177 PSMA therapy ( vipivotide tetraxetan). Patient demonstrates mild-to-moderate progression metastatic lymphadenopathy and skeletal disease identified on recent PSMA PET scan (09/17/2022). Additionally patient's PSA is gradually increasing and currently 20.5. Patient has demonstrated progression on androgen deprivation and  taxane chemotherapy. Patient explained major and minor risks and benefits of therapy. Major benefit being progression-free survival. Major risk being myelosuppression and renal toxicity. All the patient's questions were answered. Patient accompanied by daughter who was also present for consult. Patient is scheduled 6 treatments spaced 6 weeks apart. Recommend following up with oncologist for CBC and CMP 1 week prior to each treatment to assess safety of continuing with therapy. Electronically Signed   By: Genevive Bi M.D.   On: 10/07/2022 15:10   NM NO REPORT/NO CHARGE  Result Date: 10/07/2022 There is no Radiologist interpretation  for this exam.   Procedures Procedures    Medications Ordered in ED Medications  albuterol (PROVENTIL) (2.5 MG/3ML) 0.083% nebulizer solution 5 mg (5 mg Nebulization Given 10/07/22 2348)  ipratropium (ATROVENT)  nebulizer solution 0.5 mg (0.5 mg Nebulization Given 10/07/22 2348)  doxycycline (VIBRA-TABS) tablet 100 mg (100 mg Oral Given 10/08/22 0051)  amoxicillin-clavulanate (AUGMENTIN) 875-125 MG per tablet 1 tablet (1 tablet Oral Given 10/08/22 0051)    ED Course/ Medical Decision Making/ A&P Clinical Course as of 10/08/22 0129  Wed Oct 07, 2022  2325 WBC(!): 13.4 leukocytosis [DW]  2325 Glucose(!): 180 hyperglycemia [DW]    Clinical Course User Index [DW] Zadie Rhine, MD                             Medical Decision Making Amount and/or Complexity of Data Reviewed Labs:  Decision-making details documented in ED Course. Radiology: ordered.  Risk Prescription drug management.   This patient presents to the ED for concern of shortness of breath, this involves an extensive number of treatment options, and is a complaint that carries with it a high risk of complications and morbidity.  The differential diagnosis includes but is not limited to Acute coronary syndrome, pneumonia, acute pulmonary edema, pneumothorax, acute anemia, pulmonary  embolism    Comorbidities that complicate the patient evaluation: Patient's presentation is complicated by their history of COPD, prostate cancer  Social Determinants of Health: Patient's  former smoker status   increases the complexity of managing their presentation  Additional history obtained: Additional history obtained from family Records reviewed  oncology notes  Lab Tests: I Ordered, and personally interpreted labs.  The pertinent results include: Leukocytosis and hyperglycemia  Imaging Studies ordered: I ordered imaging studies including X-ray chest   I independently visualized and interpreted imaging which showed chronic small left effusion I agree with the radiologist interpretation   Medicines ordered and prescription drug management: I ordered medication including albuterol and Atrovent for coughing Reevaluation of the patient after these medicines showed that the patient    improved   Reevaluation: After the interventions noted above, I reevaluated the patient and found that they have :improved  Complexity of problems addressed: Patient's presentation is most consistent with  acute presentation with potential threat to life or bodily function  Disposition: After consideration of the diagnostic results and the patient's response to treatment,  I feel that the patent would benefit from discharge   .    Patient had intermittent cough for months.  He did have abnormal lung sounds on arrival, but this improved after nebulized treatments.  X-ray is essentially unchanged, but suspect he may be developing pneumonia.  He is in no acute distress.  No hypoxia.  He is watching television as well and appears well. Low suspicion for PE/CHF. No chest pain to suggest ACS. Will start dual antibiotics due to his underlying risk factors, may need further evaluation as an outpatient.  He was also advised to use albuterol nebs every 4-6 hours for the next 48 hours       Final  Clinical Impression(s) / ED Diagnoses Final diagnoses:  Acute cough    Rx / DC Orders ED Discharge Orders          Ordered    amoxicillin-clavulanate (AUGMENTIN) 875-125 MG tablet  Every 12 hours        10/08/22 0044    doxycycline (VIBRAMYCIN) 100 MG capsule  2 times daily        10/08/22 0044              Zadie Rhine, MD 10/08/22 0131

## 2022-10-09 ENCOUNTER — Other Ambulatory Visit (HOSPITAL_COMMUNITY): Payer: Medicare PPO

## 2022-10-21 ENCOUNTER — Ambulatory Visit: Payer: Medicare PPO | Admitting: Hematology

## 2022-10-21 ENCOUNTER — Ambulatory Visit: Payer: Medicare PPO

## 2022-10-21 ENCOUNTER — Other Ambulatory Visit: Payer: Medicare PPO

## 2022-10-28 ENCOUNTER — Other Ambulatory Visit: Payer: Medicare PPO

## 2022-10-28 ENCOUNTER — Ambulatory Visit: Payer: Medicare PPO

## 2022-10-28 ENCOUNTER — Ambulatory Visit: Payer: Medicare PPO | Admitting: Hematology

## 2022-11-03 ENCOUNTER — Other Ambulatory Visit (HOSPITAL_COMMUNITY): Payer: Self-pay | Admitting: Hematology

## 2022-11-03 ENCOUNTER — Other Ambulatory Visit: Payer: Self-pay | Admitting: *Deleted

## 2022-11-03 DIAGNOSIS — C61 Malignant neoplasm of prostate: Secondary | ICD-10-CM

## 2022-11-03 DIAGNOSIS — C7951 Secondary malignant neoplasm of bone: Secondary | ICD-10-CM

## 2022-11-04 ENCOUNTER — Other Ambulatory Visit: Payer: Self-pay

## 2022-11-09 ENCOUNTER — Inpatient Hospital Stay: Payer: Medicare PPO

## 2022-11-09 ENCOUNTER — Other Ambulatory Visit: Payer: Self-pay | Admitting: Hematology

## 2022-11-10 NOTE — Progress Notes (Signed)
[facility] [address] [place] [address]    Clinic Day:  11/11/2022  Referring physician: Clinic, [name]  Patient Care Team: Clinic, [name] as PCP - General [name], MD as Medical Oncologist (Medical Oncology) Margaretmary Dys, MD as Consulting Physician (Radiation Oncology)   ASSESSMENT & PLAN:   Assessment: 1.  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 -Abiraterone and prednisone from 07/13/2016 through 04/09/2019, discontinued secondary to PSA progression. -Enzalutamide from 04/12/2019 through 11/03/2019 with progression. -Last Lupron on 06/09/2019. -CT CAP on 11/10/2019 shows no findings of active malignancy.  Stable small sclerotic lesions at T4 and T10, nonspecific.  Right hydronephrosis with right proximal hydroureter extending down to a 0.8 x 0.8 x 0.4 cm proximal ureteral stone at L3 vertical level.  Nonobstructive right and possibly left nephrolithiasis. -Bone scan on 11/10/2019 shows right proximal tibial metaphyseal activity from recent trauma.  Degenerative findings in the spine, right sternoclavicular joint, shoulders and right foot.  No evidence of metastatic disease. -Foundation 1 test shows MS-stable.  AR amplification.  Sensitivity is reduced due to sample quality. -Guardant 360 on 12/12/2019 MSI high not detected.  TMB was not evaluable.  No other targetable mutations. -F-18-PYLARIFY PET scan showed intense radiotracer activity in the left supraclavicular and prevascular lymph nodes, measuring 10 mm.  Cluster of small prevascular lymph nodes measures 5-7 mm each with SUV 54.  AP window lymph node measuring 8 mm.  No activity in the prostate gland or abdominal or pelvic lymph nodes.  No skeletal activity. - SBRT to the left supraclavicular lymph node and prevascular mediastinal  lymph node from 08/27/2020 through 09/06/2020, 40 Gray in 5 fractions. - CT CAP (10/22/2021): Done at [facility]: No lymphadenopathy.  Cirrhosis.  Intrahepatic and extrahepatic biliary ductal dilatation. - Bone scan (10/28/2021): New focal abnormal tracer uptake at the inferior aspect of the right scapula. - PSMA PET scan (11/13/2021): Interval development of multifocal tracer avid bone metastasis, only 1 of these lesions is visible on the recent nuclear medicine bone scan dated 10/28/2021.  New tracer avid right axillary, subcarinal, left paratracheal, left retrocrural and upper abdominal lymph nodes. - 13 cycles of cabazitaxel from 12/15/2021 through 09/08/2022 with progression   2.  Androgen deprivation therapy induced bone loss: -Received Zometa every 3 months for a year completed on 10/29/2017.   3.  Genetic testing: -Germline mutation testing was negative.    Plan: 1.  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 - He met with Dr. Amil Amen for Pluvicto therapy. - We have reviewed side effects of Pluvicto including but not limited to cytopenias, nephrotoxicity, dry mouth among others. - Reviewed labs today: Normal LFTs and creatinine.  CBC grossly normal.  PSA pending. - He will proceed with his cycle 1 of Pluvicto on 11/16/2022. - RTC on 11/18/2022 for labs and toxicity assessment prior to his cycle 2.   2.  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  He may proceed with the denosumab today.   3.  Leg swelling: - Continue Bumex 1 and half tablets daily.  Mild swelling stable.   4.  Peripheral neuropathy: - Numbness in the feet has been stable.  5.  Hypomagnesemia: - Continue magnesium twice daily.  Magnesium  is normal.    Orders Placed This Encounter  Procedures   CBC with Differential/Platelet    Standing  Status:   Future    Standing Expiration Date:   11/11/2023    Order Specific Question:   Release to patient    Answer:   Immediate   Comprehensive metabolic panel    Standing Status:   Future    Standing Expiration Date:   11/11/2023    Order Specific Question:   Release to patient    Answer:   Immediate   Magnesium    Standing Status:   Future    Standing Expiration Date:   11/11/2023    Order Specific Question:   Release to patient    Answer:   Immediate   PSA    Standing Status:   Future    Standing Expiration Date:   11/11/2023      I,Katie Daubenspeck,acting as a scribe for [name], MD.,have documented all relevant documentation on the behalf of [name], MD,as directed by  [name], MD while in the presence of [name], MD.   I, [name] MD, have reviewed the above documentation for accuracy and completeness, and I agree with the above.   [name], MD   6/19/20242:26 PM  CHIEF COMPLAINT:   Diagnosis: metastatic prostate cancer to intrathoracic lymph node    Cancer Staging  No matching staging information was found for the patient.   Prior Therapy: 1. Docetaxel x 14 cycles from 08/11/2015 through 06/12/2016. 2. Abiraterone from 07/13/2016 through 04/09/2019. 3. Enzalutamide from 04/12/2019 to 11/03/2019. 4. SBRT to nodal mets, 08/27/20 - 09/06/20 5. ADT (Lupron/Eligard) through 08/06/21 6. Cabazitaxel, 13 cycles, 12/15/21 - 09/08/22  Current Therapy:  Pluvicto    HISTORY OF PRESENT ILLNESS:   Oncology History  Prostate cancer metastatic to intrathoracic lymph node (HCC)  07/24/2015 Initial Diagnosis   Prostate cancer metastatic to the left supraclavicular and anterior mediastinal lymph nodes   08/21/2015 - 06/12/2016 Chemotherapy   Docetaxel every 3 weeks for 14 cycles, discontinued as PSA has plateaued around 11    07/13/2016 Treatment Plan Change   Zytiga 1000 milligrams daily along with prednisone 5 mg  daily, started secondary to fluid overload from docetaxel   10/14/2018 Genetic Testing   Negative genetic testing.  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  The Common Hereditary Gene Panel offered by [facility] includes sequencing and/or deletion duplication testing of the following 48 genes: APC, ATM, AXIN2, BARD1, BMPR1A, BRCA1, BRCA2, BRIP1, CDH1, CDK4, CDKN2A (p14ARF), CDKN2A (p16INK4a), CHEK2, CTNNA1, DICER1, EPCAM (Deletion/duplication testing only), GREM1 (promoter region deletion/duplication testing only), KIT, MEN1, MLH1, MSH2, MSH3, MSH6, MUTYH, NBN, NF1, NHTL1, PALB2, PDGFRA, PMS2, POLD1, POLE, PTEN, RAD50, RAD51C, RAD51D, RNF43, SDHB, SDHC, SDHD, SMAD4, SMARCA4. STK11, TP53, TSC1, TSC2, and VHL.  The following genes were evaluated for sequence changes only: SDHA and HOXB13 c.251G>A variant only. The report date is Oct 14, 2018.   09/18/2019 Genetic Testing   Foundation One CDx      12/12/2019 Genetic Testing   Guardant 360       12/15/2021 - 01/06/2022 Chemotherapy   Patient is on Treatment Plan : PROSTATE Cabazitaxel + Prednisone q21d     12/15/2021 -  Chemotherapy   Patient is on Treatment Plan : PROSTATE Cabazitaxel (20) D1 + Prednisone D1-21 q21d        INTERVAL HISTORY:   Doc is a 82 y.o. male presenting to clinic today for follow up of metastatic prostate cancer to  intrathoracic lymph node. He was last seen by me on 09/29/22.  Since his last visit, he was evaluated by Dr. Amil Amen in [facility] radiology. He has been scheduled for 6 treatments of Pluvicto, given every 6 weeks starting on 11/16/22.  Today, he states that he is doing well overall. His appetite level is at 90%. His energy level is at 70%.  PAST MEDICAL HISTORY:   Past Medical History: Past Medical History:  Diagnosis Date   COPD (chronic obstructive pulmonary disease) (HCC)    Diabetes mellitus without complication (HCC)    Family history of ovarian cancer    Family history of prostate cancer    Hypertension     Prostate cancer [facility])     Surgical History: Past Surgical History:  Procedure Laterality Date   GOLD SEED IMPLANT      Social History: Social History   Socioeconomic History   Marital status: Widowed    Spouse name: Not on file   Number of children: Not on file   Years of education: Not on file   Highest education level: Not on file  Occupational History   Not on file  Tobacco Use   Smoking status: Former    Packs/day: 0.25    Years: 60.00    Additional pack years: 0.00    Total pack years: 15.00    Types: Cigarettes    Quit date: 06/25/2014    Years since quitting: 8.3   Smokeless tobacco: Never   Tobacco comments:    smoked since young age, quit in 2016  Substance and Sexual Activity   Alcohol use: Not Currently   Drug use: No   Sexual activity: Not Currently  Other Topics Concern   Not on file  Social History Narrative   Not on file   Social Determinants of Health   Financial Resource Strain: Low Risk  (05/21/2020)   Overall Financial Resource Strain (CARDIA)    Difficulty of Paying Living Expenses: Not hard at all  Food Insecurity: No Food Insecurity (05/21/2020)   Hunger Vital Sign    Worried About Running Out of Food in the Last Year: Never true    Ran Out of Food in the Last Year: Never true  Transportation Needs: No Transportation Needs (05/21/2020)   PRAPARE - Administrator, Civil Service (Medical): No    Lack of Transportation (Non-Medical): No  Physical Activity: Insufficiently Active (05/21/2020)   Exercise Vital Sign    Days of Exercise per Week: 2 days    Minutes of Exercise per Session: 20 min  Stress: No Stress Concern Present (05/21/2020)   [employer] of Occupational Health - Occupational Stress Questionnaire    Feeling of Stress : Not at all  Social Connections: Moderately Isolated (05/21/2020)   Social Connection and Isolation Panel [NHANES]    Frequency of Communication with Friends and Family: More than three  times a week    Frequency of Social Gatherings with Friends and Family: More than three times a week    Attends Religious Services: 1 to 4 times per year    Active Member of [employer] or Organizations: No    Attends Banker Meetings: Never    Marital Status: Widowed  Intimate Partner Violence: Not At Risk (05/21/2020)   Humiliation, Afraid, Rape, and Kick questionnaire    Fear of Current or Ex-Partner: No    Emotionally Abused: No    Physically Abused: No    Sexually Abused: No    Family History:  Family History  Problem Relation Age of Onset   Ovarian cancer Mother 13   Heart attack Father    Prostate cancer Maternal Uncle 77   Cancer Cousin        pat cousin with unknown cancer   Colon cancer Neg Hx    Pancreatic cancer Neg Hx    Breast cancer Neg Hx     Current Medications:  Current Outpatient Medications:    ACCU-CHEK GUIDE test strip, , Disp: , Rfl:    Accu-Chek Softclix Lancets lancets, , Disp: , Rfl:    acetaminophen (TYLENOL) 500 MG tablet, Take 500 mg by mouth every 6 (six) hours as needed for moderate pain., Disp: , Rfl:    albuterol (ACCUNEB) 0.63 MG/3ML nebulizer solution, Take 1 ampule by nebulization every 6 (six) hours as needed for wheezing., Disp: , Rfl:    albuterol (PROVENTIL) (2.5 MG/3ML) 0.083% nebulizer solution, INHALE 3 ML IN NEBULIZER BY MOUTH FOUR TIMES A DAY AS NEEDED, Disp: , Rfl:    amLODipine (NORVASC) 2.5 MG tablet, Take 1 tablet (2.5 mg total) by mouth daily as needed., Disp: 30 tablet, Rfl: 0   amoxicillin-clavulanate (AUGMENTIN) 875-125 MG tablet, Take 1 tablet by mouth every 12 (twelve) hours., Disp: 14 tablet, Rfl: 0   aspirin 81 MG EC tablet, Take 1 tablet by mouth daily., Disp: , Rfl:    atorvastatin (LIPITOR) 10 MG tablet, TAKE 1 TABLET BY MOUTH EVERY DAY (Patient taking differently: Take 5 mg by mouth daily.), Disp: 90 tablet, Rfl: 3   azelastine (ASTELIN) 0.1 % nasal spray, USE 2 SPRAYS IN EACH NOSTRIL TWICE A DAY FOR ALLERGIC  RHINITIS, Disp: , Rfl:    Blood Glucose Monitoring Suppl (ACCU-CHEK GUIDE) w/Device KIT, , Disp: , Rfl:    brompheniramine-pseudoephedrine-DM 30-2-10 MG/5ML syrup, TAKE 5 ML BY MOUTH EVERY 4 HOURS AS NEEDED FOR COUGH, Disp: 120 mL, Rfl: 0   bumetanide (BUMEX) 1 MG tablet, TAKE 1 AND 1/2 TABLETS(1.5 MG) BY MOUTH DAILY, Disp: 45 tablet, Rfl: 6   CALCIUM PO, Take 600 mg by mouth daily. , Disp: , Rfl:    cetirizine (ZYRTEC) 10 MG tablet, Take 1 tablet by mouth daily., Disp: , Rfl:    CHERATUSSIN AC 100-10 MG/5ML syrup, Take 5 mLs by mouth daily as needed. , Disp: , Rfl: 0   Cholecalciferol (VITAMIN D3) 50 MCG (2000 UT) capsule, Take 2,000 Units by mouth daily., Disp: , Rfl:    dicyclomine (BENTYL) 20 MG tablet, Take 1 tablet by mouth 3 (three) times daily as needed., Disp: , Rfl:    dorzolamide (TRUSOPT) 2 % ophthalmic solution, Place 1 drop into both eyes 3 (three) times daily., Disp: , Rfl:    doxycycline (VIBRAMYCIN) 100 MG capsule, Take 1 capsule (100 mg total) by mouth 2 (two) times daily., Disp: 14 capsule, Rfl: 0   erythromycin ophthalmic ointment, 3 (three) times daily., Disp: , Rfl:    ferrous sulfate 325 (65 FE) MG tablet, Take 1 tablet by mouth daily., Disp: , Rfl:    fexofenadine (ALLEGRA) 180 MG tablet, Take 1 tablet every day by oral route at bedtime for 90 days., Disp: , Rfl:    fluticasone (FLONASE) 50 MCG/ACT nasal spray, USE 2 SPRAY(S) IN EACH NOSTRIL ONCE DAILY FOR 30 DAYS, Disp: 15.8 mL, Rfl: 12   ibuprofen (ADVIL,MOTRIN) 600 MG tablet, Take 600 mg by mouth every 6 (six) hours as needed. , Disp: , Rfl: 0   Ipratropium-Albuterol (COMBIVENT) 20-100 MCG/ACT AERS respimat, INHALE 1 PUFF BY  MOUTH FOUR TIMES A DAY AS NEEDED COPD, Disp: , Rfl:    latanoprost (XALATAN) 0.005 % ophthalmic solution, INSTILL 1 DROP INTO AFFECTED EYE(S) BY OPHTHALMIC ROUTE ONCE DAILY INTHE EVENING, Disp: , Rfl:    Latanoprostene Bunod (VYZULTA) 0.024 % SOLN, Apply to eye., Disp: , Rfl:    Leuprolide Acetate, 4  Month, (ELIGARD) 30 MG injection, 30 MG SUBCUTANEOUSLY Q90D PRN, Disp: , Rfl:    losartan (COZAAR) 100 MG tablet, Take 1 tablet by mouth daily., Disp: , Rfl:    magnesium oxide (MAG-OX) 400 MG tablet, Take 1 tablet (400 mg total) by mouth daily. (Patient taking differently: Take 400 mg by mouth 2 (two) times daily.), Disp: 90 tablet, Rfl: 2   metFORMIN (GLUCOPHAGE) 500 MG tablet, TAKE 2 TABLETS(1000 MG) BY MOUTH TWICE DAILY WITH A MEAL, Disp: 120 tablet, Rfl: 3   montelukast (SINGULAIR) 10 MG tablet, Take 10 mg by mouth at bedtime., Disp: , Rfl:    Multiple Vitamins-Minerals (PRESERVISION AREDS PO), 1 tablet daily., Disp: , Rfl:    potassium chloride (MICRO-K) 10 MEQ CR capsule, TAKE 4 CAPSULE BY MOUTH DAILY WITH FOOD, OPEN AND SPRINKLE ON FOOD, Disp: 360 capsule, Rfl: 1   predniSONE (DELTASONE) 5 MG tablet, TAKE 1 TABLET BY MOUTH DAILY WITH BREAKFAST, Disp: 30 tablet, Rfl: 3   prochlorperazine (COMPAZINE) 10 MG tablet, Take 1 tablet (10 mg total) by mouth every 6 (six) hours as needed for nausea or vomiting., Disp: 60 tablet, Rfl: 3   sucralfate (CARAFATE) 1 g tablet, Take 1 tablet by mouth 4 (four) times daily as needed., Disp: , Rfl:    TRELEGY ELLIPTA 100-62.5-25 MCG/INH AEPB, INHALE 1 PUFF ONCE DAILY, Disp: , Rfl:    vitamin C (ASCORBIC ACID) 500 MG tablet, Take 500 mg by mouth daily., Disp: , Rfl:    ipratropium (ATROVENT) 0.03 % nasal spray, USE 2 SPRAYS IN EACH NOSTRIL TWICE DAILY AS NEEDED, Disp: , Rfl:  No current facility-administered medications for this visit.  Facility-Administered Medications Ordered in Other Visits:    denosumab (XGEVA) 120 MG/1.7ML injection, , , ,    diphenhydrAMINE (BENADRYL) 50 MG/ML injection, , , ,    famotidine (PEPCID) 20-0.9 MG/50ML-% IVPB, , , ,    Allergies: Allergies  Allergen Reactions   Lisinopril Cough   Metoprolol Other (See Comments) and Cough    Increases frequency of cough Other reaction(s): Other (See Comments) Increases frequency of  cough   Semaglutide Nausea And Vomiting    REVIEW OF SYSTEMS:   Review of Systems  Constitutional:  Negative for chills, fatigue and fever.  HENT:   Negative for lump/mass, mouth sores, nosebleeds, sore throat and trouble swallowing.   Eyes:  Negative for eye problems.  Respiratory:  Positive for shortness of breath. Negative for cough.   Cardiovascular:  Negative for chest pain, leg swelling and palpitations.  Gastrointestinal:  Negative for abdominal pain, constipation, diarrhea, nausea and vomiting.  Genitourinary:  Negative for bladder incontinence, difficulty urinating, dysuria, frequency, hematuria and nocturia.   Musculoskeletal:  Negative for arthralgias, back pain, flank pain, myalgias and neck pain.  Skin:  Negative for itching and rash.  Neurological:  Positive for numbness. Negative for dizziness and headaches.  Hematological:  Does not bruise/bleed easily.  Psychiatric/Behavioral:  Positive for sleep disturbance. Negative for depression and suicidal ideas. The patient is not nervous/anxious.   All other systems reviewed and are negative.    VITALS:   Blood pressure 127/82.  Wt Readings from Last 3 Encounters:  11/11/22 231 lb 12.8 oz (105.1 kg)  09/08/22 235 lb 3.2 oz (106.7 kg)  08/18/22 234 lb 12.8 oz (106.5 kg)    There is no height or weight on file to calculate BMI.  Performance status (ECOG): 1 - Symptomatic but completely ambulatory  PHYSICAL EXAM:   Physical Exam Vitals and nursing note reviewed. Exam conducted with a chaperone present.  Constitutional:      Appearance: Normal appearance.  Cardiovascular:     Rate and Rhythm: Normal rate and regular rhythm.     Pulses: Normal pulses.     Heart sounds: Normal heart sounds.  Pulmonary:     Effort: Pulmonary effort is normal.     Breath sounds: Normal breath sounds.  Abdominal:     Palpations: Abdomen is soft. There is no hepatomegaly, splenomegaly or mass.     Tenderness: There is no abdominal  tenderness.  Musculoskeletal:     Right lower leg: No edema.     Left lower leg: No edema.  Lymphadenopathy:     Cervical: No cervical adenopathy.     Right cervical: No superficial, deep or posterior cervical adenopathy.    Left cervical: No superficial, deep or posterior cervical adenopathy.     Upper Body:     Right upper body: No supraclavicular or axillary adenopathy.     Left upper body: No supraclavicular or axillary adenopathy.  Neurological:     General: No focal deficit present.     Mental Status: He is alert and oriented to person, place, and time.  Psychiatric:        Mood and Affect: Mood normal.        Behavior: Behavior normal.     LABS:      Latest Ref Rng & Units 11/11/2022   12:51 PM 10/07/2022   10:32 PM 09/29/2022    7:42 AM  CBC  WBC 4.0 - 10.5 K/uL 11.3  13.4  21.7   Hemoglobin 13.0 - 17.0 g/dL 16.1  09.6  04.5   Hematocrit 39.0 - 52.0 % 37.9  35.4  37.7   Platelets 150 - 400 K/uL 419  322  333       Latest Ref Rng & Units 11/11/2022   12:51 PM 10/07/2022   10:32 PM 09/29/2022    7:42 AM  CMP  Glucose 70 - 99 mg/dL 409  811  914   BUN 8 - 23 mg/dL 13  13  21    Creatinine 0.61 - 1.24 mg/dL 7.82  9.56  2.13   Sodium 135 - 145 mmol/L 139  139  140   Potassium 3.5 - 5.1 mmol/L 3.6  4.0  4.2   Chloride 98 - 111 mmol/L 101  103  100   CO2 22 - 32 mmol/L 25  29  31    Calcium 8.9 - 10.3 mg/dL 9.3  9.0  9.3   Total Protein 6.5 - 8.1 g/dL 6.8   6.8   Total Bilirubin 0.3 - 1.2 mg/dL 0.7   0.9   Alkaline Phos 38 - 126 U/L 41   64   AST 15 - 41 U/L 17   19   ALT 0 - 44 U/L 10   22      No results found for: "CEA1", "CEA" / No results found for: "CEA1", "CEA" No results found for: "PSA1" No results found for: "YQM578" No results found for: "CAN125"  No results found for: "TOTALPROTELP", "ALBUMINELP", "A1GS", "A2GS", "BETS", "BETA2SER", "GAMS", "MSPIKE", "SPEI" Lab Results  Component Value Date   TIBC 323 05/20/2022   TIBC 325 12/15/2021   TIBC 268  10/15/2021   FERRITIN 133 05/20/2022   FERRITIN 117 12/15/2021   FERRITIN 330 10/15/2021   IRONPCTSAT 12 (L) 05/20/2022   IRONPCTSAT 10 (L) 12/15/2021   IRONPCTSAT 23 10/15/2021   No results found for: "LDH"   STUDIES:   No results found.

## 2022-11-11 ENCOUNTER — Inpatient Hospital Stay: Payer: Medicare PPO | Attending: Hematology

## 2022-11-11 ENCOUNTER — Other Ambulatory Visit: Payer: Medicare PPO

## 2022-11-11 ENCOUNTER — Ambulatory Visit: Payer: Medicare PPO | Admitting: Hematology

## 2022-11-11 ENCOUNTER — Ambulatory Visit: Payer: Medicare PPO

## 2022-11-11 ENCOUNTER — Inpatient Hospital Stay: Payer: Medicare PPO

## 2022-11-11 ENCOUNTER — Inpatient Hospital Stay (HOSPITAL_BASED_OUTPATIENT_CLINIC_OR_DEPARTMENT_OTHER): Payer: Medicare PPO | Admitting: Hematology

## 2022-11-11 VITALS — BP 127/82

## 2022-11-11 DIAGNOSIS — Z79818 Long term (current) use of other agents affecting estrogen receptors and estrogen levels: Secondary | ICD-10-CM | POA: Diagnosis not present

## 2022-11-11 DIAGNOSIS — Z8042 Family history of malignant neoplasm of prostate: Secondary | ICD-10-CM | POA: Diagnosis not present

## 2022-11-11 DIAGNOSIS — C771 Secondary and unspecified malignant neoplasm of intrathoracic lymph nodes: Secondary | ICD-10-CM | POA: Diagnosis not present

## 2022-11-11 DIAGNOSIS — Z8041 Family history of malignant neoplasm of ovary: Secondary | ICD-10-CM | POA: Diagnosis not present

## 2022-11-11 DIAGNOSIS — C7951 Secondary malignant neoplasm of bone: Secondary | ICD-10-CM | POA: Insufficient documentation

## 2022-11-11 DIAGNOSIS — C61 Malignant neoplasm of prostate: Secondary | ICD-10-CM

## 2022-11-11 DIAGNOSIS — Z9221 Personal history of antineoplastic chemotherapy: Secondary | ICD-10-CM | POA: Insufficient documentation

## 2022-11-11 DIAGNOSIS — G629 Polyneuropathy, unspecified: Secondary | ICD-10-CM | POA: Insufficient documentation

## 2022-11-11 DIAGNOSIS — Z7982 Long term (current) use of aspirin: Secondary | ICD-10-CM | POA: Insufficient documentation

## 2022-11-11 DIAGNOSIS — Z87891 Personal history of nicotine dependence: Secondary | ICD-10-CM | POA: Insufficient documentation

## 2022-11-11 DIAGNOSIS — Z79899 Other long term (current) drug therapy: Secondary | ICD-10-CM | POA: Insufficient documentation

## 2022-11-11 LAB — CBC WITH DIFFERENTIAL/PLATELET
Abs Immature Granulocytes: 0.03 10*3/uL (ref 0.00–0.07)
Basophils Absolute: 0.1 10*3/uL (ref 0.0–0.1)
Basophils Relative: 1 %
Eosinophils Absolute: 0.1 10*3/uL (ref 0.0–0.5)
Eosinophils Relative: 1 %
HCT: 37.9 % — ABNORMAL LOW (ref 39.0–52.0)
Hemoglobin: 11.8 g/dL — ABNORMAL LOW (ref 13.0–17.0)
Immature Granulocytes: 0 %
Lymphocytes Relative: 9 %
Lymphs Abs: 1 10*3/uL (ref 0.7–4.0)
MCH: 28.3 pg (ref 26.0–34.0)
MCHC: 31.1 g/dL (ref 30.0–36.0)
MCV: 90.9 fL (ref 80.0–100.0)
Monocytes Absolute: 0.9 10*3/uL (ref 0.1–1.0)
Monocytes Relative: 8 %
Neutro Abs: 9.2 10*3/uL — ABNORMAL HIGH (ref 1.7–7.7)
Neutrophils Relative %: 81 %
Platelets: 419 10*3/uL — ABNORMAL HIGH (ref 150–400)
RBC: 4.17 MIL/uL — ABNORMAL LOW (ref 4.22–5.81)
RDW: 13.2 % (ref 11.5–15.5)
WBC: 11.3 10*3/uL — ABNORMAL HIGH (ref 4.0–10.5)
nRBC: 0 % (ref 0.0–0.2)

## 2022-11-11 LAB — COMPREHENSIVE METABOLIC PANEL
ALT: 10 U/L (ref 0–44)
AST: 17 U/L (ref 15–41)
Albumin: 3.6 g/dL (ref 3.5–5.0)
Alkaline Phosphatase: 41 U/L (ref 38–126)
Anion gap: 13 (ref 5–15)
BUN: 13 mg/dL (ref 8–23)
CO2: 25 mmol/L (ref 22–32)
Calcium: 9.3 mg/dL (ref 8.9–10.3)
Chloride: 101 mmol/L (ref 98–111)
Creatinine, Ser: 1.01 mg/dL (ref 0.61–1.24)
GFR, Estimated: >60 mL/min (ref >60)
Glucose, Bld: 122 mg/dL — ABNORMAL HIGH (ref 70–99)
Potassium: 3.6 mmol/L (ref 3.5–5.1)
Sodium: 139 mmol/L (ref 135–145)
Total Bilirubin: 0.7 mg/dL (ref 0.3–1.2)
Total Protein: 6.8 g/dL (ref 6.5–8.1)

## 2022-11-11 LAB — MAGNESIUM: Magnesium: 1.8 mg/dL (ref 1.7–2.4)

## 2022-11-11 LAB — PSA: Prostatic Specific Antigen: 69.68 ng/mL — ABNORMAL HIGH (ref 0.00–4.00)

## 2022-11-11 MED ORDER — SODIUM CHLORIDE 0.9% FLUSH
10.0000 mL | Freq: Once | INTRAVENOUS | Status: AC
  Administered 2022-11-11: 10 mL via INTRAVENOUS

## 2022-11-11 MED ORDER — HEPARIN SOD (PORK) LOCK FLUSH 100 UNIT/ML IV SOLN
500.0000 [IU] | Freq: Once | INTRAVENOUS | Status: AC
  Administered 2022-11-11: 500 [IU] via INTRAVENOUS

## 2022-11-11 MED ORDER — DENOSUMAB 120 MG/1.7ML ~~LOC~~ SOLN
120.0000 mg | Freq: Once | SUBCUTANEOUS | Status: AC
  Administered 2022-11-11: 120 mg via SUBCUTANEOUS
  Filled 2022-11-11: qty 1.7

## 2022-11-11 NOTE — Progress Notes (Signed)
Labs reviewed today with MD at office visit, ok to proceed with xgeva today per MD.    Andre [name] injection given per orders. Patient tolerated it well without problems. Vitals stable and discharged home from clinic ambulatory. Follow up as scheduled.

## 2022-11-11 NOTE — Patient Instructions (Addendum)
[facility]  Discharge Instructions  You were seen and examined today by Dr. Ellin Saba.  Dr. Ellin Saba discussed your most recent lab work which revealed that everything looks good.  Be sure to be sipping on water throughout the day to help with dry mouth from treatment.  Follow-up as scheduled in 1 month.    Thank you for choosing [facility] to provide your oncology and hematology care.   To afford each patient quality time with our provider, please arrive at least 15 minutes before your scheduled appointment time. You may need to reschedule your appointment if you arrive late (10 or more minutes). Arriving late affects you and other patients whose appointments are after yours.  Also, if you miss three or more appointments without notifying the office, you may be dismissed from the clinic at the provider's discretion.    Again, thank you for choosing [facility].  Our hope is that these requests will decrease the amount of time that you wait before being seen by our physicians.   If you have a lab appointment with the Cancer Center - please note that after April 8th, all labs will be drawn in the cancer center.  You do not have to check in or register with the main entrance as you have in the past but will complete your check-in at the cancer center.            _____________________________________________________________  Should you have questions after your visit to [facility], please contact our office at [phone] and follow the prompts.  Our office hours are 8:00 a.m. to 4:30 p.m. Monday - Thursday and 8:00 a.m. to 2:30 p.m. Friday.  Please note that voicemails left after 4:00 p.m. may not be returned until the following business day.  We are closed weekends and all major holidays.  You do have access to a nurse 24-7, just call the main number to the clinic [phone] and do not press any options,  hold on the line and a nurse will answer the phone.    For prescription refill requests, have your pharmacy contact our office and allow 72 hours.    Masks are no longer required in the cancer centers. If you would like for your care team to wear a mask while they are taking care of you, please let them know. You may have one support person who is at least 84 years old accompany you for your appointments.

## 2022-11-16 ENCOUNTER — Encounter (HOSPITAL_COMMUNITY)
Admission: RE | Admit: 2022-11-16 | Discharge: 2022-11-16 | Disposition: A | Discharge status: Home / Self Care | Payer: No Typology Code available for payment source | Source: Ambulatory Visit | Attending: Hematology | Admitting: Hematology

## 2022-11-16 DIAGNOSIS — C61 Malignant neoplasm of prostate: Secondary | ICD-10-CM | POA: Insufficient documentation

## 2022-11-16 DIAGNOSIS — C7952 Secondary malignant neoplasm of bone marrow: Secondary | ICD-10-CM | POA: Insufficient documentation

## 2022-11-16 DIAGNOSIS — C7951 Secondary malignant neoplasm of bone: Secondary | ICD-10-CM | POA: Insufficient documentation

## 2022-11-16 MED ORDER — SODIUM CHLORIDE 0.9 % IV SOLN: INTRAVENOUS | Status: AC

## 2022-11-16 MED ORDER — SODIUM CHLORIDE 0.9 % IV BOLUS: 1000.0000 mL | Freq: Once | INTRAVENOUS | Status: AC

## 2022-11-16 MED ORDER — LUTETIUM LU 177 VIPIVOTIDE TET 1000 MBQ/ML IV SOLN
194.2000 | Freq: Once | INTRAVENOUS | Status: AC
  Administered 2022-11-16: 194.2 via INTRAVENOUS

## 2022-11-16 NOTE — Written Directive (Addendum)
  PLUVICTO  THERAPY   RADIOPHARMACEUTICAL: Lutetium 177 vipivotide tetraxetan (Pluvicto)     PRESCRIBED DOSE FOR ADMINISTRATION:  200 mCi   ROUTE OFADMINISTRATION:  IV   DIAGNOSIS:  PROSTATE CANCER / BONE METS   REFERRING PHYSICIAN:DR [email or web address]   TREATMENT #: 1   ADDITIONAL PHYSICIAN COMMENTS/NOTES:   AUTHORIZED USER SIGNATURE & TIME STAMP: Patriciaann Clan, MD   11/16/22    10:37 AM

## 2022-11-16 NOTE — Progress Notes (Signed)
CLINICAL DATA: [82 year old male with progressive metastatic castrate resistant prostate carcinoma.]    EXAM: NUCLEAR MEDICINE PLUVICTO INJECTION  TECHNIQUE: Infusion: The nuclear medicine technologist and I personally verified the dose activity to be delivered as specified in the written directive, and verified the patient identification via 2 separate methods.  Initial flush of the intravenous catheter was performed was sterile saline. The dose syringe was connected to the catheter and the Lu-177 Pluvicto administered over a 1 to 10 min infusion. Single 10 cc  lushes with normal saline follow the dose. No complications were noted. The entire IV tubing, venocatheter, stopcock and syringes was removed in total, placed in a disposal bag and sent for assay of the residual activity, which will be reported at a later time in our EMR by the physics staff. Pressure was applied to the venipuncture site, and a compression bandage placed. Patient monitored for 1 hour following infusion.    Radiation Safety personnel were present to perform the discharge survey, as detailed on their documentation. After a short period of observation, the patient had his IV removed.  RADIOPHARMACEUTICALS: [194.2] microcuries Lu-177 PLUVICTO  FINDINGS: Current Infusion: [1]  Planned Infusions: 6    Patient presented to nuclear medicine for treatment. The patient's most recent blood counts were reviewed and remains a good candidate to proceed with Lu-177 Pluvicto.    Patient renal function is normal.  CBC stable and appropriate to proceed.     Risk and benefits of procedure were again explained to patient and daughter.  All questions answered.   The patient was situated in an infusion suite with a contact barrier placed under the arm. Intravenous access was established, using sterile technique, and a normal saline infusion from a syringe was started.     IMPRESSION: Current Infusion: [1]  Planned  Infusions: 6    [The patient tolerated the infusion well. The patient will return in 6 weeks for ongoing care.]

## 2022-11-17 ENCOUNTER — Other Ambulatory Visit: Payer: Medicare PPO

## 2022-11-17 ENCOUNTER — Ambulatory Visit: Payer: Medicare PPO | Admitting: Hematology

## 2022-11-17 ENCOUNTER — Ambulatory Visit: Payer: Medicare PPO

## 2022-11-18 ENCOUNTER — Ambulatory Visit: Payer: Medicare PPO

## 2022-11-18 ENCOUNTER — Other Ambulatory Visit: Payer: Medicare PPO

## 2022-11-18 ENCOUNTER — Ambulatory Visit: Payer: Medicare PPO | Admitting: Hematology

## 2022-12-01 ENCOUNTER — Other Ambulatory Visit: Payer: Self-pay | Admitting: Hematology

## 2022-12-18 ENCOUNTER — Inpatient Hospital Stay: Payer: No Typology Code available for payment source | Attending: Hematology

## 2022-12-18 VITALS — BP 145/79 | HR 74 | Temp 97.4°F | Resp 18

## 2022-12-18 DIAGNOSIS — C61 Malignant neoplasm of prostate: Secondary | ICD-10-CM | POA: Diagnosis present

## 2022-12-18 DIAGNOSIS — Z452 Encounter for adjustment and management of vascular access device: Secondary | ICD-10-CM | POA: Diagnosis not present

## 2022-12-18 DIAGNOSIS — C7951 Secondary malignant neoplasm of bone: Secondary | ICD-10-CM | POA: Insufficient documentation

## 2022-12-18 DIAGNOSIS — Z95828 Presence of other vascular implants and grafts: Secondary | ICD-10-CM

## 2022-12-18 DIAGNOSIS — C771 Secondary and unspecified malignant neoplasm of intrathoracic lymph nodes: Secondary | ICD-10-CM | POA: Insufficient documentation

## 2022-12-18 LAB — COMPREHENSIVE METABOLIC PANEL
ALT: 11 U/L (ref 0–44)
AST: 15 U/L (ref 15–41)
Albumin: 3.6 g/dL (ref 3.5–5.0)
Alkaline Phosphatase: 38 U/L (ref 38–126)
Anion gap: 12 (ref 5–15)
BUN: 14 mg/dL (ref 8–23)
CO2: 27 mmol/L (ref 22–32)
Calcium: 8.8 mg/dL — ABNORMAL LOW (ref 8.9–10.3)
Chloride: 100 mmol/L (ref 98–111)
Creatinine, Ser: 0.85 mg/dL (ref 0.61–1.24)
GFR, Estimated: >60 mL/min (ref >60)
Glucose, Bld: 140 mg/dL — ABNORMAL HIGH (ref 70–99)
Potassium: 3.6 mmol/L (ref 3.5–5.1)
Sodium: 139 mmol/L (ref 135–145)
Total Bilirubin: 0.8 mg/dL (ref 0.3–1.2)
Total Protein: 6.5 g/dL (ref 6.5–8.1)

## 2022-12-18 LAB — CBC WITH DIFFERENTIAL/PLATELET
Abs Immature Granulocytes: 0.03 10*3/uL (ref 0.00–0.07)
Basophils Absolute: 0 10*3/uL (ref 0.0–0.1)
Basophils Relative: 0 %
Eosinophils Absolute: 0.1 10*3/uL (ref 0.0–0.5)
Eosinophils Relative: 1 %
HCT: 35.7 % — ABNORMAL LOW (ref 39.0–52.0)
Hemoglobin: 11.2 g/dL — ABNORMAL LOW (ref 13.0–17.0)
Immature Granulocytes: 0 %
Lymphocytes Relative: 8 %
Lymphs Abs: 0.8 10*3/uL (ref 0.7–4.0)
MCH: 27.7 pg (ref 26.0–34.0)
MCHC: 31.4 g/dL (ref 30.0–36.0)
MCV: 88.4 fL (ref 80.0–100.0)
Monocytes Absolute: 0.8 10*3/uL (ref 0.1–1.0)
Monocytes Relative: 8 %
Neutro Abs: 7.7 10*3/uL (ref 1.7–7.7)
Neutrophils Relative %: 83 %
Platelets: 298 10*3/uL (ref 150–400)
RBC: 4.04 MIL/uL — ABNORMAL LOW (ref 4.22–5.81)
RDW: 13.8 % (ref 11.5–15.5)
WBC: 9.4 10*3/uL (ref 4.0–10.5)
nRBC: 0 % (ref 0.0–0.2)

## 2022-12-18 LAB — MAGNESIUM: Magnesium: 1.9 mg/dL (ref 1.7–2.4)

## 2022-12-18 LAB — PSA: Prostatic Specific Antigen: 60.48 ng/mL — ABNORMAL HIGH (ref 0.00–4.00)

## 2022-12-18 MED ORDER — SODIUM CHLORIDE 0.9% FLUSH
10.0000 mL | INTRAVENOUS | Status: DC | PRN
  Administered 2022-12-18: 10 mL via INTRAVENOUS

## 2022-12-18 MED ORDER — HEPARIN SOD (PORK) LOCK FLUSH 100 UNIT/ML IV SOLN
500.0000 [IU] | Freq: Once | INTRAVENOUS | Status: AC
  Administered 2022-12-18: 500 [IU] via INTRAVENOUS

## 2022-12-18 NOTE — Patient Instructions (Signed)
[facility]  Discharge Instructions: Thank you for choosing [facility] to provide your oncology and hematology care.  If you have a lab appointment with the Cancer Center - please note that after April 8th, 2024, all labs will be drawn in the cancer center.  You do not have to check in or register with the main entrance as you have in the past but will complete your check-in in the cancer center.  Wear comfortable clothing and clothing appropriate for easy access to any Portacath or PICC line.   We strive to give you quality time with your provider. You may need to reschedule your appointment if you arrive late (15 or more minutes).  Arriving late affects you and other patients whose appointments are after yours.  Also, if you miss three or more appointments without notifying the office, you may be dismissed from the clinic at the provider's discretion.      For prescription refill requests, have your pharmacy contact our office and allow 72 hours for refills to be completed.    Today you received the following port flush labs.   To help prevent nausea and vomiting after your treatment, we encourage you to take your nausea medication as directed.  BELOW ARE SYMPTOMS THAT SHOULD BE REPORTED IMMEDIATELY: *FEVER GREATER THAN 100.4 F (38 C) OR HIGHER *CHILLS OR SWEATING *NAUSEA AND VOMITING THAT IS NOT CONTROLLED WITH YOUR NAUSEA MEDICATION *UNUSUAL SHORTNESS OF BREATH *UNUSUAL BRUISING OR BLEEDING *URINARY PROBLEMS (pain or burning when urinating, or frequent urination) *BOWEL PROBLEMS (unusual diarrhea, constipation, pain near the anus) TENDERNESS IN MOUTH AND THROAT WITH OR WITHOUT PRESENCE OF ULCERS (sore throat, sores in mouth, or a toothache) UNUSUAL RASH, SWELLING OR PAIN  UNUSUAL VAGINAL DISCHARGE OR ITCHING   Items with * indicate a potential emergency and should be followed up as soon as possible or go to the Emergency Department if any problems  should occur.  Please show the CHEMOTHERAPY ALERT CARD or IMMUNOTHERAPY ALERT CARD at check-in to the Emergency Department and triage nurse.  Should you have questions after your visit or need to cancel or reschedule your appointment, please contact [facility] [phone]  and follow the prompts.  Office hours are 8:00 a.m. to 4:30 p.m. Monday - Friday. Please note that voicemails left after 4:00 p.m. may not be returned until the following business day.  We are closed weekends and major holidays. You have access to a nurse at all times for urgent questions. Please call the main number to the clinic [phone] and follow the prompts.  For any non-urgent questions, you may also contact your provider using MyChart. We now offer e-Visits for anyone 57 and older to request care online for non-urgent symptoms. For details visit [email or web address].   Also download the MyChart app! Go to the app store, search "MyChart", open the app, select [facility], and log in with your MyChart username and password.

## 2022-12-18 NOTE — Progress Notes (Signed)
Patients port flushed without difficulty.  Good blood return noted with no bruising or swelling noted at site.  Band aid applied.  VSS with discharge and left in satisfactory condition with no s/s of distress noted.   

## 2022-12-21 ENCOUNTER — Inpatient Hospital Stay: Payer: Medicare PPO

## 2022-12-24 NOTE — Written Directive (Addendum)
  PLUVICTO  THERAPY   RADIOPHARMACEUTICAL: Lutetium 177 vipivotide tetraxetan (Pluvicto)     PRESCRIBED DOSE FOR ADMINISTRATION:  200 mCi   ROUTE OFADMINISTRATION:  IV   DIAGNOSIS: Prostate cancer    REFERRING PHYSICIAN: Dr Ellin Saba    TREATMENT #: 2    ADDITIONAL PHYSICIAN COMMENTS/NOTES:   AUTHORIZED USER SIGNATURE & TIME STAMP: Patriciaann Clan, MD   12/28/22    8:10 AM

## 2022-12-28 ENCOUNTER — Encounter (HOSPITAL_COMMUNITY)
Admission: RE | Admit: 2022-12-28 | Discharge: 2022-12-28 | Disposition: A | Discharge status: Home / Self Care | Payer: Medicare PPO | Source: Ambulatory Visit | Attending: Hematology | Admitting: Hematology

## 2022-12-28 DIAGNOSIS — C61 Malignant neoplasm of prostate: Secondary | ICD-10-CM | POA: Insufficient documentation

## 2022-12-28 DIAGNOSIS — C7952 Secondary malignant neoplasm of bone marrow: Secondary | ICD-10-CM | POA: Insufficient documentation

## 2022-12-28 DIAGNOSIS — C7951 Secondary malignant neoplasm of bone: Secondary | ICD-10-CM | POA: Diagnosis present

## 2022-12-28 MED ORDER — SODIUM CHLORIDE 0.9 % IV BOLUS: 1000.0000 mL | Freq: Once | INTRAVENOUS | Status: DC

## 2022-12-28 MED ORDER — LUTETIUM LU 177 VIPIVOTIDE TET 1000 MBQ/ML IV SOLN
198.6000 | Freq: Once | INTRAVENOUS | Status: AC
  Administered 2022-12-28: 198.6 via INTRAVENOUS

## 2022-12-28 NOTE — Progress Notes (Signed)
CLINICAL DATA: [82 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  Metastatic disease]  EXAM: NUCLEAR MEDICINE PLUVICTO INJECTION  TECHNIQUE: Infusion: The nuclear medicine technologist and I personally verified the dose activity to be delivered as specified in the written directive, and verified the patient identification via 2 separate methods.  Initial flush of the intravenous catheter was performed was sterile saline. The dose syringe was connected to the catheter and the Lu-177 Pluvicto administered over a 1 to 10 min infusion. Single 10 cc  lushes with normal saline follow the dose. No complications were noted. The entire IV tubing, venocatheter, stopcock and syringes was removed in total, placed in a disposal bag and sent for assay of the residual activity, which will be reported at a later time in our EMR by the physics staff. Pressure was applied to the venipuncture site, and a compression bandage placed. Patient monitored for 1 hour following infusion.    Radiation Safety personnel were present to perform the discharge survey, as detailed on their documentation. After a short period of observation, the patient had his IV removed.  RADIOPHARMACEUTICALS: [198.6] microcuries Lu-177 PLUVICTO  FINDINGS: Current Infusion: [2]  Planned Infusions: 6   Patient presented to nuclear medicine for treatment.  Mild fatigue and dry mouth several days after treatment.  Otherwise no complaints     The patient's most recent blood counts were reviewed and remains a good candidate to proceed with Lu-177 Pluvicto.     PSA  slightly improved at 60.5 compared to 70 on 11/11/2022     The patient was situated in an infusion suite with a contact barrier placed under the arm. Intravenous access was established, using sterile technique, and a normal saline infusion from a syringe was started.    IMPRESSION: Current Infusion: [2]  Planned Infusions: 6   [The patient tolerated  the infusion well. The patient will return in 6 weeks for ongoing care.]

## 2022-12-29 ENCOUNTER — Inpatient Hospital Stay: Payer: Medicare PPO

## 2022-12-29 ENCOUNTER — Inpatient Hospital Stay: Payer: Medicare PPO | Admitting: Hematology

## 2023-01-04 NOTE — Progress Notes (Signed)
[facility] [address], [place] [address]    Clinic Day:  01/05/2023  Referring physician: [facility], [name]  Patient Care Team: [facility], [name] as PCP - General [name], MD as Medical Oncologist (Medical Oncology) Margaretmary Dys, MD as Consulting Physician (Radiation Oncology)   ASSESSMENT & PLAN:   Assessment: 1.  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 -Abiraterone and prednisone from 07/13/2016 through 04/09/2019, discontinued secondary to PSA progression. -Enzalutamide from 04/12/2019 through 11/03/2019 with progression. -Last Lupron on 06/09/2019. -CT CAP on 11/10/2019 shows no findings of active malignancy.  Stable small sclerotic lesions at T4 and T10, nonspecific.  Right hydronephrosis with right proximal hydroureter extending down to a 0.8 x 0.8 x 0.4 cm proximal ureteral stone at L3 vertical level.  Nonobstructive right and possibly left nephrolithiasis. -Bone scan on 11/10/2019 shows right proximal tibial metaphyseal activity from recent trauma.  Degenerative findings in the spine, right sternoclavicular joint, shoulders and right foot.  No evidence of metastatic disease. -Foundation 1 test shows MS-stable.  AR amplification.  Sensitivity is reduced due to sample quality. -Guardant 360 on 12/12/2019 MSI high not detected.  TMB was not evaluable.  No other targetable mutations. -F-18-PYLARIFY PET scan showed intense radiotracer activity in the left supraclavicular and prevascular lymph nodes, measuring 10 mm.  Cluster of small prevascular lymph nodes measures 5-7 mm each with SUV 54.  AP window lymph node measuring 8 mm.  No activity in the prostate gland or abdominal or pelvic lymph nodes.  No skeletal activity. - SBRT to the left supraclavicular lymph node and prevascular mediastinal  lymph node from 08/27/2020 through 09/06/2020, 40 Gray in 5 fractions. - CT CAP (10/22/2021): Done at [facility]: No lymphadenopathy.  Cirrhosis.  Intrahepatic and extrahepatic biliary ductal dilatation. - Bone scan (10/28/2021): New focal abnormal tracer uptake at the inferior aspect of the right scapula. - PSMA PET scan (11/13/2021): Interval development of multifocal tracer avid bone metastasis, only 1 of these lesions is visible on the recent nuclear medicine bone scan dated 10/28/2021.  New tracer avid right axillary, subcarinal, left paratracheal, left retrocrural and upper abdominal lymph nodes. - 13 cycles of cabazitaxel from 12/15/2021 through 09/08/2022 with progression - Pluvicto cycle 1 on 11/16/2022, cycle 2 on 01/12/2023   2.  Androgen deprivation therapy induced bone loss: -Received Zometa every 3 months for a year completed on 10/29/2017.   3.  Genetic testing: -Germline mutation testing was negative.    Plan: 1.  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 - Reported dry mouth which started after first cycle.  He is eating hard candy and sipping on water.  He also reported some tiredness. - No GI side effects noted. - Reviewed labs today: Normal creatinine and electrolytes.  CBC grossly normal. - His PSA has improved to 31 from 69 prior to start of therapy. - He will proceed with cycle 3 of Pluvicto on 02/09/2023.  Will check his CBC and CMP during the week of 02/01/2023. - His last Eligard 45 mg was on 09/08/2022.  He will receive next shot in October. - RTC 03/16/2023 with repeat labs and PSA.   2.  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  He may proceed with denosumab today.  3.  Leg swelling: - Continue Bumex 1/2 tablets daily.  Mild swelling stable.   4.  Peripheral neuropathy: - Numbness in the feet is stable.  5.  Hypomagnesemia: - Continue  magnesium twice daily.    Orders Placed This Encounter  Procedures   CBC with Differential    Standing Status:   Standing    Number of Occurrences:   10    Standing Expiration Date:   01/05/2024   Comprehensive metabolic panel    Standing Status:   Standing    Number of Occurrences:   10    Standing Expiration Date:   01/05/2024   PSA    Standing Status:   Standing    Number of Occurrences:   10    Standing Expiration Date:   01/05/2024      Alben Deeds Teague,acting as a scribe for [name], MD.,have documented all relevant documentation on the behalf of [name], MD,as directed by  [name], MD while in the presence of [name], MD.  I, [name] MD, have reviewed the above documentation for accuracy and completeness, and I agree with the above.    [name], MD   8/13/20245:48 PM  CHIEF COMPLAINT:   Diagnosis: metastatic prostate cancer to intrathoracic lymph node    Cancer Staging  No matching staging information was found for the patient.    Prior Therapy: 1. Docetaxel x 14 cycles from 08/11/2015 through 06/12/2016. 2. Abiraterone from 07/13/2016 through 04/09/2019. 3. Enzalutamide from 04/12/2019 to 11/03/2019. 4. SBRT to nodal mets, 08/27/20 - 09/06/20 5. ADT (Lupron/Eligard) through 08/06/21 6. Cabazitaxel, 13 cycles, 12/15/21 - 09/08/22  Current Therapy:  Pluvicto    HISTORY OF PRESENT ILLNESS:   Oncology History  Prostate cancer metastatic to intrathoracic lymph node (HCC)  07/24/2015 Initial Diagnosis   Prostate cancer metastatic to the left supraclavicular and anterior mediastinal lymph nodes   08/21/2015 - 06/12/2016 Chemotherapy   Docetaxel every 3 weeks for 14 cycles, discontinued as PSA has plateaued around 11    07/13/2016 Treatment Plan Change   Zytiga 1000 milligrams daily along with prednisone 5 mg daily, started secondary to fluid overload from docetaxel   10/14/2018 Genetic Testing    Negative genetic testing.  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  The Common Hereditary Gene Panel offered by [facility] includes sequencing and/or deletion duplication testing of the following 48 genes: APC, ATM, AXIN2, BARD1, BMPR1A, BRCA1, BRCA2, BRIP1, CDH1, CDK4, CDKN2A (p14ARF), CDKN2A (p16INK4a), CHEK2, CTNNA1, DICER1, EPCAM (Deletion/duplication testing only), GREM1 (promoter region deletion/duplication testing only), KIT, MEN1, MLH1, MSH2, MSH3, MSH6, MUTYH, NBN, NF1, NHTL1, PALB2, PDGFRA, PMS2, POLD1, POLE, PTEN, RAD50, RAD51C, RAD51D, RNF43, SDHB, SDHC, SDHD, SMAD4, SMARCA4. STK11, TP53, TSC1, TSC2, and VHL.  The following genes were evaluated for sequence changes only: SDHA and HOXB13 c.251G>A variant only. The report date is Oct 14, 2018.   09/18/2019 Genetic Testing   Foundation One CDx      12/12/2019 Genetic Testing   Guardant 360       12/15/2021 - 01/06/2022 Chemotherapy   Patient is on Treatment Plan : PROSTATE Cabazitaxel + Prednisone q21d     12/15/2021 -  Chemotherapy   Patient is on Treatment Plan : PROSTATE Cabazitaxel (20) D1 + Prednisone D1-21 q21d        INTERVAL HISTORY:   Andre [name] is a 82 y.o. male presenting to clinic today for follow up of metastatic prostate cancer to intrathoracic lymph node. He was last seen by me on 11/11/22.  Today, he states that he is doing well overall. His appetite level is at 100%. His energy level is at 70%.  His last Pluvicto treatment was on 12/28/22.   PAST MEDICAL HISTORY:   Past Medical History: Past Medical History:  Diagnosis Date   COPD (chronic obstructive pulmonary disease) (HCC)    Diabetes mellitus without complication (HCC)    Family history of ovarian cancer    Family history of prostate cancer    Hypertension    Prostate cancer [facility])     Surgical History: Past Surgical History:  Procedure Laterality Date   GOLD SEED IMPLANT      Social History: Social History   Socioeconomic History   Marital status:  Widowed    Spouse name: Not on file   Number of children: Not on file   Years of education: Not on file   Highest education level: Not on file  Occupational History   Not on file  Tobacco Use   Smoking status: Former    Current packs/day: 0.00    Average packs/day: 0.3 packs/day for 60.0 years (15.0 ttl pk-yrs)    Types: Cigarettes    Start date: 06/25/1954    Quit date: 06/25/2014    Years since quitting: 8.5   Smokeless tobacco: Never   Tobacco comments:    smoked since young age, quit in 2016  Substance and Sexual Activity   Alcohol use: Not Currently   Drug use: No   Sexual activity: Not Currently  Other Topics Concern   Not on file  Social History Narrative   Not on file   Social Determinants of Health   Financial Resource Strain: Low Risk  (08/08/2020)   Received from [facility]   Overall Financial Resource Strain (CARDIA)    Difficulty of Paying Living Expenses: Not hard at all  Food Insecurity: No Food Insecurity (08/08/2020)   Received from [facility], [facility]   Hunger Vital Sign    Worried About Running Out of Food in the Last Year: Never true    Ran Out of Food in the Last Year: Never true  Transportation Needs: No Transportation Needs (08/08/2020)   Received from [facility], [facility]   [facility] - Transportation    Lack of Transportation (Medical): No    Lack of Transportation (Non-Medical): No  Physical Activity: Insufficiently Active (05/21/2020)   Exercise Vital Sign    Days of Exercise per Week: 2 days    Minutes of Exercise per Session: 20 min  Stress: No Stress Concern Present (05/21/2020)   [employer] of Occupational Health - Occupational Stress Questionnaire    Feeling of Stress : Not at all  Social Connections: Moderately Isolated (05/21/2020)   Social Connection and Isolation Panel [NHANES]    Frequency of Communication with Friends and Family: More than three times a week    Frequency of Social  Gatherings with Friends and Family: More than three times a week    Attends Religious Services: 1 to 4 times per year    Active Member of [employer] or Organizations: No    Attends Banker Meetings: Never    Marital Status: Widowed  Intimate Partner Violence: Not At Risk (05/21/2020)   Humiliation, Afraid, Rape, and Kick questionnaire    Fear of Current or Ex-Partner: No    Emotionally Abused: No    Physically Abused: No    Sexually Abused: No    Family History: Family History  Problem Relation Age of Onset   Ovarian cancer Mother 80   Heart attack Father    Prostate cancer Maternal Uncle 57   Cancer Cousin        pat cousin with unknown cancer   Colon cancer Neg Hx    Pancreatic cancer Neg Hx    Breast cancer Neg Hx     Current Medications:  Current Outpatient Medications:    ACCU-CHEK GUIDE test strip, , Disp: , Rfl:    Accu-Chek Softclix Lancets lancets, , Disp: , Rfl:    acetaminophen (TYLENOL) 500 MG tablet, Take 500 mg by mouth every 6 (six) hours as needed for moderate pain., Disp: , Rfl:    albuterol (ACCUNEB) 0.63 MG/3ML nebulizer solution, Take 1 ampule by nebulization every 6 (six) hours as needed for wheezing., Disp: , Rfl:    albuterol (PROVENTIL) (2.5 MG/3ML) 0.083% nebulizer solution, INHALE 3 ML IN NEBULIZER BY MOUTH FOUR TIMES A DAY AS NEEDED, Disp: , Rfl:    amLODipine (NORVASC) 2.5 MG tablet, Take 1 tablet (2.5 mg total) by mouth daily as needed., Disp: 30 tablet, Rfl: 0   amoxicillin-clavulanate (AUGMENTIN) 875-125 MG tablet, Take 1 tablet by mouth every 12 (twelve) hours., Disp: 14 tablet, Rfl: 0   aspirin 81 MG EC tablet, Take 1 tablet by mouth daily., Disp: , Rfl:    atorvastatin (LIPITOR) 10 MG tablet, TAKE 1 TABLET BY MOUTH EVERY DAY (Patient taking differently: Take 5 mg by mouth daily.), Disp: 90 tablet, Rfl: 3   azelastine (ASTELIN) 0.1 % nasal spray, USE 2 SPRAYS IN EACH NOSTRIL TWICE A DAY FOR ALLERGIC RHINITIS, Disp: , Rfl:    Blood  Glucose Monitoring Suppl (ACCU-CHEK GUIDE) w/Device KIT, , Disp: , Rfl:    brompheniramine-pseudoephedrine-DM 30-2-10 MG/5ML syrup, TAKE 5 ML BY MOUTH EVERY 4 HOURS AS NEEDED FOR COUGH, Disp: 120 mL, Rfl: 0   bumetanide (BUMEX) 1 MG tablet, TAKE 1 AND 1/2 TABLETS(1.5 MG) BY MOUTH DAILY, Disp: 45 tablet, Rfl: 6   CALCIUM PO, Take 600 mg by mouth daily. , Disp: , Rfl:    cetirizine (ZYRTEC) 10 MG tablet, Take 1 tablet by mouth daily., Disp: , Rfl:    CHERATUSSIN AC 100-10 MG/5ML syrup, Take 5 mLs by mouth daily as needed. , Disp: , Rfl: 0   Cholecalciferol (VITAMIN D3) 50 MCG (2000 UT) capsule, Take 2,000 Units by mouth daily., Disp: , Rfl:    dicyclomine (BENTYL) 20 MG tablet, Take 1 tablet by mouth 3 (three) times daily as needed., Disp: , Rfl:    dorzolamide (TRUSOPT) 2 % ophthalmic solution, Place 1 drop into both eyes 3 (three) times daily., Disp: , Rfl:    doxycycline (VIBRAMYCIN) 100 MG capsule, Take 1 capsule (100 mg total) by mouth 2 (two) times daily., Disp: 14 capsule, Rfl: 0   erythromycin ophthalmic ointment, 3 (three) times daily., Disp: , Rfl:    ferrous sulfate 325 (65 FE) MG tablet, Take 1 tablet by mouth daily., Disp: , Rfl:    fexofenadine (ALLEGRA) 180 MG tablet, Take 1 tablet every day by oral route at bedtime for 90 days., Disp: , Rfl:    fluticasone (FLONASE) 50 MCG/ACT nasal spray, USE 2 SPRAY(S) IN EACH NOSTRIL ONCE DAILY FOR 30 DAYS, Disp: 15.8 mL, Rfl: 12   ibuprofen (ADVIL,MOTRIN) 600 MG tablet, Take 600 mg by mouth every 6 (six) hours as needed. , Disp: , Rfl: 0   ipratropium (ATROVENT) 0.03 % nasal spray, USE 2 SPRAYS IN EACH NOSTRIL TWICE  DAILY AS NEEDED, Disp: , Rfl:    Ipratropium-Albuterol (COMBIVENT) 20-100 MCG/ACT AERS respimat, INHALE 1 PUFF BY MOUTH FOUR TIMES A DAY AS NEEDED COPD, Disp: , Rfl:    latanoprost (XALATAN) 0.005 % ophthalmic solution, INSTILL 1 DROP INTO AFFECTED EYE(S) BY OPHTHALMIC ROUTE ONCE DAILY INTHE EVENING, Disp: , Rfl:    Latanoprostene  Bunod (VYZULTA) 0.024 % SOLN, Apply to eye., Disp: , Rfl:    Leuprolide Acetate, 4 Month, (ELIGARD) 30 MG injection, 30 MG SUBCUTANEOUSLY Q90D PRN, Disp: , Rfl:    losartan (COZAAR) 100 MG tablet, Take 1 tablet by mouth daily., Disp: , Rfl:    magnesium oxide (MAG-OX) 400 MG tablet, Take 1 tablet (400 mg total) by mouth daily. (Patient taking differently: Take 400 mg by mouth 2 (two) times daily.), Disp: 90 tablet, Rfl: 2   metFORMIN (GLUCOPHAGE) 500 MG tablet, TAKE 2 TABLETS(1000 MG) BY MOUTH TWICE DAILY WITH A MEAL, Disp: 120 tablet, Rfl: 3   montelukast (SINGULAIR) 10 MG tablet, Take 10 mg by mouth at bedtime., Disp: , Rfl:    Multiple Vitamins-Minerals (PRESERVISION AREDS PO), 1 tablet daily., Disp: , Rfl:    potassium chloride (MICRO-K) 10 MEQ CR capsule, TAKE 4 CAPSULE BY MOUTH DAILY WITH FOOD, OPEN AND SPRINKLE ON FOOD, Disp: 348 capsule, Rfl: 2   predniSONE (DELTASONE) 5 MG tablet, TAKE 1 TABLET BY MOUTH DAILY WITH BREAKFAST, Disp: 30 tablet, Rfl: 3   prochlorperazine (COMPAZINE) 10 MG tablet, Take 1 tablet (10 mg total) by mouth every 6 (six) hours as needed for nausea or vomiting., Disp: 60 tablet, Rfl: 3   sucralfate (CARAFATE) 1 g tablet, Take 1 tablet by mouth 4 (four) times daily as needed., Disp: , Rfl:    TRELEGY ELLIPTA 100-62.5-25 MCG/INH AEPB, INHALE 1 PUFF ONCE DAILY, Disp: , Rfl:    vitamin C (ASCORBIC ACID) 500 MG tablet, Take 500 mg by mouth daily., Disp: , Rfl:  No current facility-administered medications for this visit.  Facility-Administered Medications Ordered in Other Visits:    denosumab (XGEVA) 120 MG/1.7ML injection, , , ,    diphenhydrAMINE (BENADRYL) 50 MG/ML injection, , , ,    famotidine (PEPCID) 20-0.9 MG/50ML-% IVPB, , , ,    Allergies: Allergies  Allergen Reactions   Lisinopril Cough   Metoprolol Other (See Comments) and Cough    Increases frequency of cough Other reaction(s): Other (See Comments) Increases frequency of cough   Semaglutide Nausea And  Vomiting    REVIEW OF SYSTEMS:   Review of Systems  Constitutional:  Negative for chills, fatigue and fever.  HENT:   Negative for lump/mass, mouth sores, nosebleeds, sore throat and trouble swallowing.   Eyes:  Negative for eye problems.  Respiratory:  Positive for cough and shortness of breath.   Cardiovascular:  Negative for chest pain, leg swelling and palpitations.  Gastrointestinal:  Negative for abdominal pain, constipation, diarrhea, nausea and vomiting.  Genitourinary:  Negative for bladder incontinence, difficulty urinating, dysuria, frequency, hematuria and nocturia.   Musculoskeletal:  Negative for arthralgias, back pain, flank pain, myalgias and neck pain.  Skin:  Negative for itching and rash.  Neurological:  Negative for dizziness, headaches and numbness.  Hematological:  Does not bruise/bleed easily.  Psychiatric/Behavioral:  Positive for sleep disturbance. Negative for depression and suicidal ideas. The patient is not nervous/anxious.   All other systems reviewed and are negative.    VITALS:   Weight 229 lb 12.8 oz (104.2 kg).  Wt Readings from Last 3 Encounters:  01/05/23  229 lb 12.8 oz (104.2 kg)  11/11/22 231 lb 12.8 oz (105.1 kg)  09/08/22 235 lb 3.2 oz (106.7 kg)    Body mass index is 33.94 kg/m.  Performance status (ECOG): 1 - Symptomatic but completely ambulatory  PHYSICAL EXAM:   Physical Exam Vitals and nursing note reviewed. Exam conducted with a chaperone present.  Constitutional:      Appearance: Normal appearance.  Cardiovascular:     Rate and Rhythm: Normal rate and regular rhythm.     Pulses: Normal pulses.     Heart sounds: Normal heart sounds.  Pulmonary:     Effort: Pulmonary effort is normal.     Breath sounds: Normal breath sounds.  Abdominal:     Palpations: Abdomen is soft. There is no hepatomegaly, splenomegaly or mass.     Tenderness: There is no abdominal tenderness.  Musculoskeletal:     Right lower leg: No edema.     Left  lower leg: No edema.  Lymphadenopathy:     Cervical: No cervical adenopathy.     Right cervical: No superficial, deep or posterior cervical adenopathy.    Left cervical: No superficial, deep or posterior cervical adenopathy.     Upper Body:     Right upper body: No supraclavicular or axillary adenopathy.     Left upper body: No supraclavicular or axillary adenopathy.  Neurological:     General: No focal deficit present.     Mental Status: He is alert and oriented to person, place, and time.  Psychiatric:        Mood and Affect: Mood normal.        Behavior: Behavior normal.     LABS:      Latest Ref Rng & Units 01/05/2023    9:12 AM 12/18/2022    9:55 AM 11/11/2022   12:51 PM  CBC  WBC 4.0 - 10.5 K/uL 10.0  9.4  11.3   Hemoglobin 13.0 - 17.0 g/dL 16.1  09.6  04.5   Hematocrit 39.0 - 52.0 % 36.9  35.7  37.9   Platelets 150 - 400 K/uL 378  298  419       Latest Ref Rng & Units 01/05/2023    9:12 AM 12/18/2022    9:55 AM 11/11/2022   12:51 PM  CMP  Glucose 70 - 99 mg/dL 409  811  914   BUN 8 - 23 mg/dL 12  14  13    Creatinine 0.61 - 1.24 mg/dL 7.82  9.56  2.13   Sodium 135 - 145 mmol/L 139  139  139   Potassium 3.5 - 5.1 mmol/L 3.6  3.6  3.6   Chloride 98 - 111 mmol/L 101  100  101   CO2 22 - 32 mmol/L 29  27  25    Calcium 8.9 - 10.3 mg/dL 9.0  8.8  9.3   Total Protein 6.5 - 8.1 g/dL  6.5  6.8   Total Bilirubin 0.3 - 1.2 mg/dL  0.8  0.7   Alkaline Phos 38 - 126 U/L  38  41   AST 15 - 41 U/L  15  17   ALT 0 - 44 U/L  11  10      No results found for: "CEA1", "CEA" / No results found for: "CEA1", "CEA" No results found for: "PSA1" No results found for: "YQM578" No results found for: "CAN125"  No results found for: "TOTALPROTELP", "ALBUMINELP", "A1GS", "A2GS", "BETS", "BETA2SER", "GAMS", "MSPIKE", "SPEI" Lab Results  Component Value Date  TIBC 323 05/20/2022   TIBC 325 12/15/2021   TIBC 268 10/15/2021   FERRITIN 133 05/20/2022   FERRITIN 117 12/15/2021   FERRITIN  330 10/15/2021   IRONPCTSAT 12 (L) 05/20/2022   IRONPCTSAT 10 (L) 12/15/2021   IRONPCTSAT 23 10/15/2021   No results found for: "LDH"   STUDIES:   NM PLUVICTO ADMINISTRATION  Result Date: 12/28/2022 CLINICAL DATA:  82 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 Metastatic disease EXAM: NUCLEAR MEDICINE PLUVICTO INJECTION TECHNIQUE: Infusion: The nuclear medicine technologist and I personally verified the dose activity to be delivered as specified in the written directive, and verified the patient identification via 2 separate methods. Initial flush of the intravenous catheter was performed was sterile saline. The dose syringe was connected to the catheter and the Lu-177 Pluvicto administered over a 1 to 10 min infusion. Single 10 cc lushes with normal saline follow the dose. No complications were noted. The entire IV tubing, venocatheter, stopcock and syringes was removed in total, placed in a disposal bag and sent for assay of the residual activity, which will be reported at a later time in our EMR by the physics staff. Pressure was applied to the venipuncture site, and a compression bandage placed. Patient monitored for 1 hour following infusion. Radiation Safety personnel were present to perform the discharge survey, as detailed on their documentation. After a short period of observation, the patient had his IV removed. RADIOPHARMACEUTICALS:  198.6 microcuries Lu-177 PLUVICTO FINDINGS: Current Infusion: 2 Planned Infusions: 6 Patient presented to nuclear medicine for treatment. Mild fatigue and dry mouth several days after treatment. Otherwise no complaints The patient's most recent blood counts were reviewed and remains a good candidate to proceed with Lu-177 Pluvicto. PSA  slightly improved at 60.5 compared to 70 on 11/11/2022 The patient was situated in an infusion suite with a contact barrier placed under the arm. Intravenous access was established, using sterile technique, and a  normal saline infusion from a syringe was started. Micro-dosimetry: The prescribed radiation activity was assayed and confirmed to be within specified tolerance. IMPRESSION: Current Infusion: 2 Planned Infusions: 6 The patient tolerated the infusion well. The patient will return in 6 weeks for ongoing care. Electronically Signed   By: Genevive Bi M.D.   On: 12/28/2022 12:10

## 2023-01-05 ENCOUNTER — Inpatient Hospital Stay: Payer: Medicare PPO

## 2023-01-05 ENCOUNTER — Inpatient Hospital Stay (HOSPITAL_BASED_OUTPATIENT_CLINIC_OR_DEPARTMENT_OTHER): Payer: Medicare PPO | Admitting: Hematology

## 2023-01-05 ENCOUNTER — Inpatient Hospital Stay: Payer: Medicare PPO | Attending: Hematology

## 2023-01-05 VITALS — Wt 229.8 lb

## 2023-01-05 DIAGNOSIS — Z87891 Personal history of nicotine dependence: Secondary | ICD-10-CM | POA: Diagnosis not present

## 2023-01-05 DIAGNOSIS — C771 Secondary and unspecified malignant neoplasm of intrathoracic lymph nodes: Secondary | ICD-10-CM | POA: Diagnosis not present

## 2023-01-05 DIAGNOSIS — Z9221 Personal history of antineoplastic chemotherapy: Secondary | ICD-10-CM | POA: Insufficient documentation

## 2023-01-05 DIAGNOSIS — C61 Malignant neoplasm of prostate: Secondary | ICD-10-CM | POA: Insufficient documentation

## 2023-01-05 DIAGNOSIS — G629 Polyneuropathy, unspecified: Secondary | ICD-10-CM | POA: Insufficient documentation

## 2023-01-05 DIAGNOSIS — C7951 Secondary malignant neoplasm of bone: Secondary | ICD-10-CM | POA: Insufficient documentation

## 2023-01-05 LAB — CBC
HCT: 36.9 % — ABNORMAL LOW (ref 39.0–52.0)
Hemoglobin: 11.5 g/dL — ABNORMAL LOW (ref 13.0–17.0)
MCH: 27.6 pg (ref 26.0–34.0)
MCHC: 31.2 g/dL (ref 30.0–36.0)
MCV: 88.5 fL (ref 80.0–100.0)
Platelets: 378 10*3/uL (ref 150–400)
RBC: 4.17 MIL/uL — ABNORMAL LOW (ref 4.22–5.81)
RDW: 14.4 % (ref 11.5–15.5)
WBC: 10 10*3/uL (ref 4.0–10.5)
nRBC: 0 % (ref 0.0–0.2)

## 2023-01-05 LAB — BASIC METABOLIC PANEL
Anion gap: 9 (ref 5–15)
BUN: 12 mg/dL (ref 8–23)
CO2: 29 mmol/L (ref 22–32)
Calcium: 9 mg/dL (ref 8.9–10.3)
Chloride: 101 mmol/L (ref 98–111)
Creatinine, Ser: 0.86 mg/dL (ref 0.61–1.24)
GFR, Estimated: >60 mL/min (ref >60)
Glucose, Bld: 150 mg/dL — ABNORMAL HIGH (ref 70–99)
Potassium: 3.6 mmol/L (ref 3.5–5.1)
Sodium: 139 mmol/L (ref 135–145)

## 2023-01-05 LAB — PSA: Prostatic Specific Antigen: 31.51 ng/mL — ABNORMAL HIGH (ref 0.00–4.00)

## 2023-01-05 MED ORDER — HEPARIN SOD (PORK) LOCK FLUSH 100 UNIT/ML IV SOLN
500.0000 [IU] | Freq: Once | INTRAVENOUS | Status: AC
  Administered 2023-01-05: 500 [IU] via INTRAVENOUS

## 2023-01-05 MED ORDER — DENOSUMAB 120 MG/1.7ML ~~LOC~~ SOLN
120.0000 mg | Freq: Once | SUBCUTANEOUS | Status: AC
  Administered 2023-01-05: 120 mg via SUBCUTANEOUS
  Filled 2023-01-05: qty 1.7

## 2023-01-05 MED ORDER — SODIUM CHLORIDE 0.9% FLUSH
10.0000 mL | Freq: Once | INTRAVENOUS | Status: AC
  Administered 2023-01-05: 10 mL via INTRAVENOUS

## 2023-01-05 NOTE — Progress Notes (Signed)
Xgeva injection given per orders. Patient tolerated it well without problems. Vitals stable and discharged home from clinic ambulatory. Follow up as scheduled.  

## 2023-01-05 NOTE — Progress Notes (Signed)
Andre [name] presented for Portacath access and flush. Portacath located right chest wall accessed with  H 20 needle. Good blood return present. Portacath flushed with 20ml NS and 500U/2ml Heparin and needle removed intact. Procedure without incident. Patient tolerated procedure well.

## 2023-01-05 NOTE — Patient Instructions (Addendum)
[facility] at [facility] Discharge Instructions   You were seen and examined today by Dr. Ellin Saba.  He reviewed the results of your lab work which are normal/stable.   We will see you back in October prior to your October Pluvicto treatment.   Return as scheduled.    Thank you for choosing [facility] at [facility] to provide your oncology and hematology care.  To afford each patient quality time with our provider, please arrive at least 15 minutes before your scheduled appointment time.   If you have a lab appointment with the Cancer Center please come in thru the Main Entrance and check in at the main information desk.  You need to re-schedule your appointment should you arrive 10 or more minutes late.  We strive to give you quality time with our providers, and arriving late affects you and other patients whose appointments are after yours.  Also, if you no show three or more times for appointments you may be dismissed from the clinic at the providers discretion.     Again, thank you for choosing [facility].  Our hope is that these requests will decrease the amount of time that you wait before being seen by our physicians.       _____________________________________________________________  Should you have questions after your visit to [facility], please contact our office at [phone] and follow the prompts.  Our office hours are 8:00 a.m. and 4:30 p.m. Monday - Friday.  Please note that voicemails left after 4:00 p.m. may not be returned until the following business day.  We are closed weekends and major holidays.  You do have access to a nurse 24-7, just call the main number to the clinic [phone] and do not press any options, hold on the line and a nurse will answer the phone.    For prescription refill requests, have your pharmacy contact our office and allow 72 hours.    Due to Covid, you will need  to wear a mask upon entering the hospital. If you do not have a mask, a mask will be given to you at the Main Entrance upon arrival. For doctor visits, patients may have 1 support person age 77 or older with them. For treatment visits, patients can not have anyone with them due to social distancing guidelines and our immunocompromised population.

## 2023-01-18 ENCOUNTER — Other Ambulatory Visit: Payer: Self-pay | Admitting: *Deleted

## 2023-01-18 MED ORDER — METFORMIN HCL 500 MG PO TABS: 1000.0000 mg | ORAL_TABLET | Freq: Two times a day (BID) | ORAL | 3 refills | Status: DC

## 2023-02-02 ENCOUNTER — Inpatient Hospital Stay: Payer: Medicare PPO | Attending: Hematology

## 2023-02-02 VITALS — BP 144/71 | HR 73 | Temp 97.1°F | Resp 20

## 2023-02-02 DIAGNOSIS — C7951 Secondary malignant neoplasm of bone: Secondary | ICD-10-CM | POA: Diagnosis present

## 2023-02-02 DIAGNOSIS — C61 Malignant neoplasm of prostate: Secondary | ICD-10-CM | POA: Insufficient documentation

## 2023-02-02 LAB — COMPREHENSIVE METABOLIC PANEL
ALT: 11 U/L (ref 0–44)
AST: 14 U/L — ABNORMAL LOW (ref 15–41)
Albumin: 3.6 g/dL (ref 3.5–5.0)
Alkaline Phosphatase: 36 U/L — ABNORMAL LOW (ref 38–126)
Anion gap: 11 (ref 5–15)
BUN: 16 mg/dL (ref 8–23)
CO2: 26 mmol/L (ref 22–32)
Calcium: 9.7 mg/dL (ref 8.9–10.3)
Chloride: 103 mmol/L (ref 98–111)
Creatinine, Ser: 0.91 mg/dL (ref 0.61–1.24)
GFR, Estimated: >60 mL/min (ref >60)
Glucose, Bld: 148 mg/dL — ABNORMAL HIGH (ref 70–99)
Potassium: 4 mmol/L (ref 3.5–5.1)
Sodium: 140 mmol/L (ref 135–145)
Total Bilirubin: 0.5 mg/dL (ref 0.3–1.2)
Total Protein: 6.4 g/dL — ABNORMAL LOW (ref 6.5–8.1)

## 2023-02-02 LAB — CBC WITH DIFFERENTIAL/PLATELET
Abs Immature Granulocytes: 0.03 K/uL (ref 0.00–0.07)
Basophils Absolute: 0.1 K/uL (ref 0.0–0.1)
Basophils Relative: 1 %
Eosinophils Absolute: 0.1 K/uL (ref 0.0–0.5)
Eosinophils Relative: 1 %
HCT: 36.6 % — ABNORMAL LOW (ref 39.0–52.0)
Hemoglobin: 11.2 g/dL — ABNORMAL LOW (ref 13.0–17.0)
Immature Granulocytes: 0 %
Lymphocytes Relative: 6 %
Lymphs Abs: 0.6 K/uL — ABNORMAL LOW (ref 0.7–4.0)
MCH: 26.8 pg (ref 26.0–34.0)
MCHC: 30.6 g/dL (ref 30.0–36.0)
MCV: 87.6 fL (ref 80.0–100.0)
Monocytes Absolute: 0.7 K/uL (ref 0.1–1.0)
Monocytes Relative: 7 %
Neutro Abs: 7.7 K/uL (ref 1.7–7.7)
Neutrophils Relative %: 85 %
Platelets: 326 K/uL (ref 150–400)
RBC: 4.18 MIL/uL — ABNORMAL LOW (ref 4.22–5.81)
RDW: 15 % (ref 11.5–15.5)
WBC: 9.1 K/uL (ref 4.0–10.5)
nRBC: 0 % (ref 0.0–0.2)

## 2023-02-02 LAB — PSA: Prostatic Specific Antigen: 21.21 ng/mL — ABNORMAL HIGH (ref 0.00–4.00)

## 2023-02-02 MED ORDER — SODIUM CHLORIDE 0.9% FLUSH
10.0000 mL | Freq: Once | INTRAVENOUS | Status: AC
  Administered 2023-02-02: 10 mL via INTRAVENOUS

## 2023-02-02 MED ORDER — HEPARIN SOD (PORK) LOCK FLUSH 100 UNIT/ML IV SOLN
500.0000 [IU] | Freq: Once | INTRAVENOUS | Status: AC
  Administered 2023-02-02: 500 [IU] via INTRAVENOUS

## 2023-02-02 NOTE — Patient Instructions (Signed)

## 2023-02-02 NOTE — Progress Notes (Signed)
Patients port flushed without difficulty.  Good blood return noted with no bruising or swelling noted at site.  Band aid applied.  VSS with discharge and left in satisfactory condition with no s/s of distress noted.   

## 2023-02-04 NOTE — Written Directive (Addendum)
  PLUVICTO  THERAPY   RADIOPHARMACEUTICAL: Lutetium 177 vipivotide tetraxetan (Pluvicto)     PRESCRIBED DOSE FOR ADMINISTRATION:  200 mCi   ROUTE OFADMINISTRATION:  IV   DIAGNOSIS: Prostate Cancer    REFERRING PHYSICIAN: Dr. Ellin Saba    TREATMENT #: 3    ADDITIONAL PHYSICIAN COMMENTS/NOTES:   AUTHORIZED USER SIGNATURE & TIME STAMP: Patriciaann Clan, MD   02/08/23    12:55 PM

## 2023-02-09 ENCOUNTER — Encounter (HOSPITAL_COMMUNITY)
Admission: RE | Admit: 2023-02-09 | Discharge: 2023-02-09 | Disposition: A | Discharge status: Home / Self Care | Payer: No Typology Code available for payment source | Source: Ambulatory Visit | Attending: Hematology | Admitting: Hematology

## 2023-02-09 DIAGNOSIS — C7952 Secondary malignant neoplasm of bone marrow: Secondary | ICD-10-CM | POA: Insufficient documentation

## 2023-02-09 DIAGNOSIS — C7951 Secondary malignant neoplasm of bone: Secondary | ICD-10-CM | POA: Diagnosis present

## 2023-02-09 DIAGNOSIS — C61 Malignant neoplasm of prostate: Secondary | ICD-10-CM | POA: Diagnosis present

## 2023-02-09 MED ORDER — LUTETIUM LU 177 VIPIVOTIDE TET 1000 MBQ/ML IV SOLN
200.0000 | Freq: Once | INTRAVENOUS | Status: AC
  Administered 2023-02-09: 200.99 via INTRAVENOUS

## 2023-02-09 MED ORDER — SODIUM CHLORIDE 0.9 % IV BOLUS: 1000.0000 mL | Freq: Once | INTRAVENOUS | Status: DC

## 2023-02-09 NOTE — Progress Notes (Signed)
  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 The patient's most recent blood counts were reviewed and remains a good candidate to proceed with Lu-177 Pluvicto.    No myelosuppression.  No renal insufficiency.     Patient reports mild fatigue following therapy.  No adverse effects otherwise.     PSA decreased to 21 from 60 prior to therapy.     The patient was situated in an infusion suite with a contact barrier placed under the arm. Intravenous access was established, using sterile technique, and a normal saline infusion from a syringe was started.     Micro-dosimetry: The prescribed radiation activity was assayed and confirmed to be within specified tolerance.  IMPRESSION: Current Infusion: [3]  Planned Infusions: 6    [The patient tolerated the infusion well. The patient will return in 6 weeks for ongoing care.]

## 2023-02-09 NOTE — Progress Notes (Signed)
Pt tolerated treatment well.

## 2023-02-15 ENCOUNTER — Inpatient Hospital Stay: Payer: Medicare PPO

## 2023-02-22 ENCOUNTER — Other Ambulatory Visit (HOSPITAL_COMMUNITY): Payer: No Typology Code available for payment source

## 2023-03-16 ENCOUNTER — Inpatient Hospital Stay: Payer: Medicare PPO

## 2023-03-16 ENCOUNTER — Inpatient Hospital Stay: Payer: Medicare PPO | Attending: Hematology | Admitting: Hematology

## 2023-03-16 ENCOUNTER — Encounter: Payer: Self-pay | Admitting: Hematology

## 2023-03-16 DIAGNOSIS — C61 Malignant neoplasm of prostate: Secondary | ICD-10-CM | POA: Insufficient documentation

## 2023-03-16 DIAGNOSIS — C778 Secondary and unspecified malignant neoplasm of lymph nodes of multiple regions: Secondary | ICD-10-CM | POA: Diagnosis not present

## 2023-03-16 DIAGNOSIS — Z7982 Long term (current) use of aspirin: Secondary | ICD-10-CM | POA: Diagnosis not present

## 2023-03-16 DIAGNOSIS — G629 Polyneuropathy, unspecified: Secondary | ICD-10-CM | POA: Insufficient documentation

## 2023-03-16 DIAGNOSIS — C771 Secondary and unspecified malignant neoplasm of intrathoracic lymph nodes: Secondary | ICD-10-CM | POA: Diagnosis not present

## 2023-03-16 DIAGNOSIS — Z87891 Personal history of nicotine dependence: Secondary | ICD-10-CM | POA: Diagnosis not present

## 2023-03-16 DIAGNOSIS — Z7952 Long term (current) use of systemic steroids: Secondary | ICD-10-CM | POA: Insufficient documentation

## 2023-03-16 DIAGNOSIS — C7951 Secondary malignant neoplasm of bone: Secondary | ICD-10-CM | POA: Diagnosis present

## 2023-03-16 DIAGNOSIS — Z79899 Other long term (current) drug therapy: Secondary | ICD-10-CM | POA: Diagnosis not present

## 2023-03-16 LAB — COMPREHENSIVE METABOLIC PANEL
ALT: 11 U/L (ref 0–44)
AST: 15 U/L (ref 15–41)
Albumin: 3.6 g/dL (ref 3.5–5.0)
Alkaline Phosphatase: 36 U/L — ABNORMAL LOW (ref 38–126)
Anion gap: 10 (ref 5–15)
BUN: 16 mg/dL (ref 8–23)
CO2: 26 mmol/L (ref 22–32)
Calcium: 8.9 mg/dL (ref 8.9–10.3)
Chloride: 102 mmol/L (ref 98–111)
Creatinine, Ser: 1 mg/dL (ref 0.61–1.24)
GFR, Estimated: >60 mL/min (ref >60)
Glucose, Bld: 156 mg/dL — ABNORMAL HIGH (ref 70–99)
Potassium: 4.3 mmol/L (ref 3.5–5.1)
Sodium: 138 mmol/L (ref 135–145)
Total Bilirubin: 0.5 mg/dL (ref 0.3–1.2)
Total Protein: 6.1 g/dL — ABNORMAL LOW (ref 6.5–8.1)

## 2023-03-16 LAB — CBC WITH DIFFERENTIAL/PLATELET
Abs Immature Granulocytes: 0.02 10*3/uL (ref 0.00–0.07)
Basophils Absolute: 0.1 10*3/uL (ref 0.0–0.1)
Basophils Relative: 1 %
Eosinophils Absolute: 0.1 10*3/uL (ref 0.0–0.5)
Eosinophils Relative: 1 %
HCT: 35.5 % — ABNORMAL LOW (ref 39.0–52.0)
Hemoglobin: 11.1 g/dL — ABNORMAL LOW (ref 13.0–17.0)
Immature Granulocytes: 0 %
Lymphocytes Relative: 6 %
Lymphs Abs: 0.5 10*3/uL — ABNORMAL LOW (ref 0.7–4.0)
MCH: 27.6 pg (ref 26.0–34.0)
MCHC: 31.3 g/dL (ref 30.0–36.0)
MCV: 88.3 fL (ref 80.0–100.0)
Monocytes Absolute: 0.7 10*3/uL (ref 0.1–1.0)
Monocytes Relative: 8 %
Neutro Abs: 7.3 10*3/uL (ref 1.7–7.7)
Neutrophils Relative %: 84 %
Platelets: 302 10*3/uL (ref 150–400)
RBC: 4.02 MIL/uL — ABNORMAL LOW (ref 4.22–5.81)
RDW: 15.1 % (ref 11.5–15.5)
WBC: 8.6 10*3/uL (ref 4.0–10.5)
nRBC: 0 % (ref 0.0–0.2)

## 2023-03-16 LAB — PSA: Prostatic Specific Antigen: 10.82 ng/mL — ABNORMAL HIGH (ref 0.00–4.00)

## 2023-03-16 MED ORDER — HEPARIN SOD (PORK) LOCK FLUSH 100 UNIT/ML IV SOLN
500.0000 [IU] | Freq: Once | INTRAVENOUS | Status: AC
  Administered 2023-03-16: 500 [IU] via INTRAVENOUS

## 2023-03-16 MED ORDER — SODIUM CHLORIDE 0.9% FLUSH
10.0000 mL | Freq: Once | INTRAVENOUS | Status: AC
  Administered 2023-03-16: 10 mL via INTRAVENOUS

## 2023-03-16 NOTE — Progress Notes (Signed)
Patients port flushed without difficulty. Good blood return noted with no bruising or swelling noted at site. Band aid applied. VSS with discharge and left in satisfactory condition with no s/s of distress noted. All follow ups as scheduled.  Marquie Aderhold [employer]

## 2023-03-16 NOTE — Patient Instructions (Signed)
[facility] at [facility] Discharge Instructions   You were seen and examined today by Dr. Ellin Saba.  He reviewed the results of your lab work which are normal/stable. Your PSA level from today is pending.   Continue Pluvicto as scheduled.   We will see you back in 6 weeks. We will repeat lab work at that time.   Return as scheduled.    Thank you for choosing [facility] at [facility] to provide your oncology and hematology care.  To afford each patient quality time with our provider, please arrive at least 15 minutes before your scheduled appointment time.   If you have a lab appointment with the Cancer Center please come in thru the Main Entrance and check in at the main information desk.  You need to re-schedule your appointment should you arrive 10 or more minutes late.  We strive to give you quality time with our providers, and arriving late affects you and other patients whose appointments are after yours.  Also, if you no show three or more times for appointments you may be dismissed from the clinic at the providers discretion.     Again, thank you for choosing [facility].  Our hope is that these requests will decrease the amount of time that you wait before being seen by our physicians.       _____________________________________________________________  Should you have questions after your visit to [facility], please contact our office at [phone] and follow the prompts.  Our office hours are 8:00 a.m. and 4:30 p.m. Monday - Friday.  Please note that voicemails left after 4:00 p.m. may not be returned until the following business day.  We are closed weekends and major holidays.  You do have access to a nurse 24-7, just call the main number to the clinic [phone] and do not press any options, hold on the line and a nurse will answer the phone.    For prescription refill requests, have your pharmacy  contact our office and allow 72 hours.    Due to Covid, you will need to wear a mask upon entering the hospital. If you do not have a mask, a mask will be given to you at the Main Entrance upon arrival. For doctor visits, patients may have 1 support person age 68 or older with them. For treatment visits, patients can not have anyone with them due to social distancing guidelines and our immunocompromised population.

## 2023-03-16 NOTE — Progress Notes (Signed)
Andre [name] [address], [place] [address]    Clinic Day:  03/16/23   Referring physician: Clinic, Andre [name]  Patient Care Team: Clinic, [name] as PCP - General Andre Massed, MD as Medical Oncologist (Medical Oncology) Andre Dys, MD as Consulting Physician (Radiation Oncology)   ASSESSMENT & PLAN:   Assessment: 1.  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 -Abiraterone and prednisone from 07/13/2016 through 04/09/2019, discontinued secondary to PSA progression. -Enzalutamide from 04/12/2019 through 11/03/2019 with progression. -Last Lupron on 06/09/2019. -CT CAP on 11/10/2019 shows no findings of active malignancy.  Stable small sclerotic lesions at T4 and T10, nonspecific.  Right hydronephrosis with right proximal hydroureter extending down to a 0.8 x 0.8 x 0.4 cm proximal ureteral stone at L3 vertical level.  Nonobstructive right and possibly left nephrolithiasis. -Bone scan on 11/10/2019 shows right proximal tibial metaphyseal activity from recent trauma.  Degenerative findings in the spine, right sternoclavicular joint, shoulders and right foot.  No evidence of metastatic disease. -Foundation 1 test shows MS-stable.  AR amplification.  Sensitivity is reduced due to sample quality. -Guardant 360 on 12/12/2019 MSI high not detected.  TMB was not evaluable.  No other targetable mutations. -F-18-PYLARIFY PET scan showed intense radiotracer activity in the left supraclavicular and prevascular lymph nodes, measuring 10 mm.  Cluster of small prevascular lymph nodes measures 5-7 mm each with SUV 54.  AP window lymph node measuring 8 mm.  No activity in the prostate gland or abdominal or pelvic lymph nodes.  No skeletal activity. - SBRT to the left supraclavicular lymph node and prevascular mediastinal  lymph node from 08/27/2020 through 09/06/2020, 40 Gray in 5 fractions. - CT CAP (10/22/2021): Done at [name]: No lymphadenopathy.  Cirrhosis.  Intrahepatic and extrahepatic biliary ductal dilatation. - Bone scan (10/28/2021): New focal abnormal tracer uptake at the inferior aspect of the right scapula. - PSMA PET scan (11/13/2021): Interval development of multifocal tracer avid bone metastasis, only 1 of these lesions is visible on the recent nuclear medicine bone scan dated 10/28/2021.  New tracer avid right axillary, subcarinal, left paratracheal, left retrocrural and upper abdominal lymph nodes. - 13 cycles of cabazitaxel from 12/15/2021 through 09/08/2022 with progression - Pluvicto cycle 1 on 11/16/2022, cycle 2 on 01/12/2023, cycle 3 on 02/09/2023   2.  Androgen deprivation therapy induced bone loss: -Received Zometa every 3 months for a year completed on 10/29/2017.   3.  Genetic testing: -Germline mutation testing was negative.    Plan: 1.  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 - He feels sluggish for 2 to 3 days after each injection. - He has dry mouth and sucking on hard candy which helps.  Does not have any dysphagia. - Last Eligard 45 mg was on 09/08/2022. - Labs today: Normal LFTs and creatinine.  CBC grossly normal.  PSA has improved to 10.8. - He may proceed with cycle 4 of Pluvicto next week.  RTC 6 weeks for follow-up. - Will give him Eligard at next visit.   2.  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  Last denosumab was on 01/05/2023. - He will receive denosumab at next visit.   3.  Leg swelling: - Continue Bumex half tablet daily.  Mild swelling is stable.   4.  Peripheral neuropathy: - Numbness in the feet is stable.  5.  Hypomagnesemia: - Continue magnesium twice daily.  Magnesium is normal.    Orders Placed This Encounter  Procedures   CBC with  Differential    Standing Status:   Future    Standing Expiration Date:   03/15/2024   Comprehensive metabolic panel    Standing Status:   Future    Standing Expiration Date:   03/15/2024   PSA    Standing Status:   Future    Standing Expiration Date:   03/15/2024      Andre [name],acting as a scribe for Andre Massed, MD.,have documented all relevant documentation on the behalf of Andre Massed, MD,as directed by  Andre Massed, MD while in the presence of Andre Massed, MD.  I, Andre Massed MD, have reviewed the above documentation for accuracy and completeness, and I agree with the above.     Andre Massed, MD   10/22/20246:08 PM  CHIEF COMPLAINT:   Diagnosis: metastatic prostate cancer to intrathoracic lymph node    Cancer Staging  No matching staging information was found for the patient.    Prior Therapy: 1. Docetaxel x 14 cycles from 08/11/2015 through 06/12/2016. 2. Abiraterone from 07/13/2016 through 04/09/2019. 3. Enzalutamide from 04/12/2019 to 11/03/2019. 4. SBRT to nodal mets, 08/27/20 - 09/06/20 5. ADT (Lupron/Eligard) through 08/06/21 6. Cabazitaxel, 13 cycles, 12/15/21 - 09/08/22  Current Therapy:  Pluvicto    HISTORY OF PRESENT ILLNESS:   Oncology History  Prostate cancer metastatic to intrathoracic lymph node (HCC)  07/24/2015 Initial Diagnosis   Prostate cancer metastatic to the left supraclavicular and anterior mediastinal lymph nodes   08/21/2015 - 06/12/2016 Chemotherapy   Docetaxel every 3 weeks for 14 cycles, discontinued as PSA has plateaued around 11    07/13/2016 Treatment Plan Change   Zytiga 1000 milligrams daily along with prednisone 5 mg daily, started secondary to fluid overload from docetaxel   10/14/2018 Genetic Testing   Negative genetic testing.  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  The Common Hereditary Gene Panel offered by [facility] includes sequencing and/or deletion duplication testing of the  following 48 genes: APC, ATM, AXIN2, BARD1, BMPR1A, BRCA1, BRCA2, BRIP1, CDH1, CDK4, CDKN2A (p14ARF), CDKN2A (p16INK4a), CHEK2, CTNNA1, DICER1, EPCAM (Deletion/duplication testing only), GREM1 (promoter region deletion/duplication testing only), KIT, MEN1, MLH1, MSH2, MSH3, MSH6, MUTYH, NBN, NF1, NHTL1, PALB2, PDGFRA, PMS2, POLD1, POLE, PTEN, RAD50, RAD51C, RAD51D, RNF43, SDHB, SDHC, SDHD, SMAD4, SMARCA4. STK11, TP53, TSC1, TSC2, and VHL.  The following genes were evaluated for sequence changes only: SDHA and HOXB13 c.251G>A variant only. The report date is Oct 14, 2018.   09/18/2019 Genetic Testing   Foundation One CDx      12/12/2019 Genetic Testing   Guardant 360       12/15/2021 - 01/06/2022 Chemotherapy   Patient is on Treatment Plan : PROSTATE Cabazitaxel + Prednisone q21d     12/15/2021 -  Chemotherapy   Patient is on Treatment Plan : PROSTATE Cabazitaxel (20) D1 + Prednisone D1-21 q21d        INTERVAL HISTORY:   Andre [name] is a 82 y.o. male presenting to clinic today for follow up of metastatic prostate cancer to intrathoracic lymph node. He was last seen by me on 01/05/23.  Today, he states that he is doing well overall. His appetite level is at 100%. His energy level is at 75%. He is accompanied by his wife.   He does have an associated dry cough. He completed 3  cycles of Pluvicto. He has mild sluggishness 2-3 days after treatment and reports a dry mouth, but denies any difficulty swallowing. He denies any other side effects from treatment. Leg swelling remains stable and he denies any recent falls. SOB and constipation are not new and are stable.   PAST MEDICAL HISTORY:   Past Medical History: Past Medical History:  Diagnosis Date   COPD (chronic obstructive pulmonary disease) (HCC)    Diabetes mellitus without complication (HCC)    Family history of ovarian cancer    Family history of prostate cancer    Hypertension    Prostate cancer [name])     Surgical History: Past  Surgical History:  Procedure Laterality Date   GOLD SEED IMPLANT      Social History: Social History   Socioeconomic History   Marital status: Widowed    Spouse name: Not on file   Number of children: Not on file   Years of education: Not on file   Highest education level: Not on file  Occupational History   Not on file  Tobacco Use   Smoking status: Former    Current packs/day: 0.00    Average packs/day: 0.3 packs/day for 60.0 years (15.0 ttl pk-yrs)    Types: Cigarettes    Start date: 06/25/1954    Quit date: 06/25/2014    Years since quitting: 8.7   Smokeless tobacco: Never   Tobacco comments:    smoked since young age, quit in 2016  Substance and Sexual Activity   Alcohol use: Not Currently   Drug use: No   Sexual activity: Not Currently  Other Topics Concern   Not on file  Social History Narrative   Not on file   Social Determinants of Health   Financial Resource Strain: Low Risk  (08/08/2020)   Received from [facility], [name]   Overall Financial Resource Strain (CARDIA)    Difficulty of Paying Living Expenses: Not hard at all  Food Insecurity: No Food Insecurity (08/08/2020)   Received from [facility], [facility]   Hunger Vital Sign    Worried About Running Out of Food in the Last Year: Never true    Ran Out of Food in the Last Year: Never true  Transportation Needs: No Transportation Needs (08/08/2020)   Received from [facility], [name]   [facility] - Transportation    Lack of Transportation (Medical): No    Lack of Transportation (Non-Medical): No  Physical Activity: Insufficiently Active (05/21/2020)   Exercise Vital Sign    Days of Exercise per Week: 2 days    Minutes of Exercise per Session: 20 min  Stress: No Stress Concern Present (05/21/2020)   [employer] of Occupational Health - Occupational Stress Questionnaire    Feeling of Stress : Not at all  Social Connections: Moderately Isolated (05/21/2020)   Social  Connection and Isolation Panel [NHANES]    Frequency of Communication with Friends and Family: More than three times a week    Frequency of Social Gatherings with Friends and Family: More than three times a week    Attends Religious Services: 1 to 4 times per year    Active Member of [employer] or Organizations: No    Attends Banker Meetings: Never    Marital Status: Widowed  Intimate Partner Violence: Not At Risk (05/21/2020)   Humiliation, Afraid, Rape, and Kick questionnaire    Fear of Current or Ex-Partner: No    Emotionally Abused: No  Physically Abused: No    Sexually Abused: No    Family History: Family History  Problem Relation Age of Onset   Ovarian cancer Mother 32   Heart attack Father    Prostate cancer Maternal Uncle 37   Cancer Cousin        pat cousin with unknown cancer   Colon cancer Neg Hx    Pancreatic cancer Neg Hx    Breast cancer Neg Hx     Current Medications:  Current Outpatient Medications:    ACCU-CHEK GUIDE test strip, , Disp: , Rfl:    Accu-Chek Softclix Lancets lancets, , Disp: , Rfl:    acetaminophen (TYLENOL) 500 MG tablet, Take 500 mg by mouth every 6 (six) hours as needed for moderate pain., Disp: , Rfl:    albuterol (ACCUNEB) 0.63 MG/3ML nebulizer solution, Take 1 ampule by nebulization every 6 (six) hours as needed for wheezing., Disp: , Rfl:    albuterol (PROVENTIL) (2.5 MG/3ML) 0.083% nebulizer solution, INHALE 3 ML IN NEBULIZER BY MOUTH FOUR TIMES A DAY AS NEEDED, Disp: , Rfl:    amLODipine (NORVASC) 2.5 MG tablet, Take 1 tablet (2.5 mg total) by mouth daily as needed., Disp: 30 tablet, Rfl: 0   aspirin 81 MG EC tablet, Take 1 tablet by mouth daily., Disp: , Rfl:    atorvastatin (LIPITOR) 10 MG tablet, TAKE 1 TABLET BY MOUTH EVERY DAY (Patient taking differently: Take 5 mg by mouth daily.), Disp: 90 tablet, Rfl: 3   azelastine (ASTELIN) 0.1 % nasal spray, USE 2 SPRAYS IN EACH NOSTRIL TWICE A DAY FOR ALLERGIC RHINITIS, Disp: ,  Rfl:    Blood Glucose Monitoring Suppl (ACCU-CHEK GUIDE) w/Device KIT, , Disp: , Rfl:    brompheniramine-pseudoephedrine-DM 30-2-10 MG/5ML syrup, TAKE 5 ML BY MOUTH EVERY 4 HOURS AS NEEDED FOR COUGH, Disp: 120 mL, Rfl: 0   bumetanide (BUMEX) 1 MG tablet, TAKE 1 AND 1/2 TABLETS(1.5 MG) BY MOUTH DAILY, Disp: 45 tablet, Rfl: 6   CALCIUM PO, Take 600 mg by mouth daily. , Disp: , Rfl:    cetirizine (ZYRTEC) 10 MG tablet, Take 1 tablet by mouth daily., Disp: , Rfl:    CHERATUSSIN AC 100-10 MG/5ML syrup, Take 5 mLs by mouth daily as needed. , Disp: , Rfl: 0   Cholecalciferol (VITAMIN D3) 50 MCG (2000 UT) capsule, Take 2,000 Units by mouth daily., Disp: , Rfl:    dicyclomine (BENTYL) 20 MG tablet, Take 1 tablet by mouth 3 (three) times daily as needed., Disp: , Rfl:    dorzolamide (TRUSOPT) 2 % ophthalmic solution, Place 1 drop into both eyes 3 (three) times daily., Disp: , Rfl:    erythromycin ophthalmic ointment, 3 (three) times daily., Disp: , Rfl:    ferrous sulfate 325 (65 FE) MG tablet, Take 1 tablet by mouth daily., Disp: , Rfl:    fexofenadine (ALLEGRA) 180 MG tablet, Take 1 tablet every day by oral route at bedtime for 90 days., Disp: , Rfl:    fluticasone (FLONASE) 50 MCG/ACT nasal spray, USE 2 SPRAY(S) IN EACH NOSTRIL ONCE DAILY FOR 30 DAYS, Disp: 15.8 mL, Rfl: 12   ibuprofen (ADVIL,MOTRIN) 600 MG tablet, Take 600 mg by mouth every 6 (six) hours as needed. , Disp: , Rfl: 0   ipratropium (ATROVENT) 0.03 % nasal spray, USE 2 SPRAYS IN EACH NOSTRIL TWICE DAILY AS NEEDED, Disp: , Rfl:    Ipratropium-Albuterol (COMBIVENT) 20-100 MCG/ACT AERS respimat, INHALE 1 PUFF BY MOUTH FOUR TIMES A DAY AS NEEDED COPD, Disp: ,  Rfl:    latanoprost (XALATAN) 0.005 % ophthalmic solution, INSTILL 1 DROP INTO AFFECTED EYE(S) BY OPHTHALMIC ROUTE ONCE DAILY INTHE EVENING, Disp: , Rfl:    Latanoprostene Bunod (VYZULTA) 0.024 % SOLN, Apply to eye., Disp: , Rfl:    Leuprolide Acetate, 4 Month, (ELIGARD) 30 MG injection,  30 MG SUBCUTANEOUSLY Q90D PRN, Disp: , Rfl:    losartan (COZAAR) 100 MG tablet, Take 1 tablet by mouth daily., Disp: , Rfl:    magnesium oxide (MAG-OX) 400 MG tablet, Take 1 tablet (400 mg total) by mouth daily. (Patient taking differently: Take 400 mg by mouth 2 (two) times daily.), Disp: 90 tablet, Rfl: 2   metFORMIN (GLUCOPHAGE) 500 MG tablet, Take 2 tablets (1,000 mg total) by mouth 2 (two) times daily with a meal., Disp: 120 tablet, Rfl: 3   montelukast (SINGULAIR) 10 MG tablet, Take 10 mg by mouth at bedtime., Disp: , Rfl:    Multiple Vitamins-Minerals (PRESERVISION AREDS PO), 1 tablet daily., Disp: , Rfl:    potassium chloride (MICRO-K) 10 MEQ CR capsule, TAKE 4 CAPSULE BY MOUTH DAILY WITH FOOD, OPEN AND SPRINKLE ON FOOD, Disp: 348 capsule, Rfl: 2   predniSONE (DELTASONE) 5 MG tablet, TAKE 1 TABLET BY MOUTH DAILY WITH BREAKFAST, Disp: 30 tablet, Rfl: 3   prochlorperazine (COMPAZINE) 10 MG tablet, Take 1 tablet (10 mg total) by mouth every 6 (six) hours as needed for nausea or vomiting., Disp: 60 tablet, Rfl: 3   TRELEGY ELLIPTA 100-62.5-25 MCG/INH AEPB, INHALE 1 PUFF ONCE DAILY, Disp: , Rfl:    vitamin C (ASCORBIC ACID) 500 MG tablet, Take 500 mg by mouth daily., Disp: , Rfl:    amoxicillin-clavulanate (AUGMENTIN) 875-125 MG tablet, Take 1 tablet by mouth every 12 (twelve) hours., Disp: 14 tablet, Rfl: 0   doxycycline (VIBRAMYCIN) 100 MG capsule, Take 1 capsule (100 mg total) by mouth 2 (two) times daily., Disp: 14 capsule, Rfl: 0   sucralfate (CARAFATE) 1 g tablet, Take 1 tablet by mouth 4 (four) times daily as needed. (Patient not taking: Reported on 03/16/2023), Disp: , Rfl:  No current facility-administered medications for this visit.  Facility-Administered Medications Ordered in Other Visits:    denosumab (XGEVA) 120 MG/1.7ML injection, , , ,    diphenhydrAMINE (BENADRYL) 50 MG/ML injection, , , ,    famotidine (PEPCID) 20-0.9 MG/50ML-% IVPB, , , ,    Allergies: Allergies  Allergen  Reactions   Lisinopril Cough   Metoprolol Other (See Comments) and Cough    Increases frequency of cough Other reaction(s): Other (See Comments) Increases frequency of cough   Semaglutide Nausea And Vomiting    REVIEW OF SYSTEMS:   Review of Systems  Constitutional:  Negative for chills, fatigue and fever.  HENT:   Negative for lump/mass, mouth sores, nosebleeds, sore throat and trouble swallowing.   Eyes:  Negative for eye problems.  Respiratory:  Positive for shortness of breath (with exertion). Negative for cough.   Cardiovascular:  Negative for chest pain, leg swelling and palpitations.  Gastrointestinal:  Positive for constipation. Negative for abdominal pain, diarrhea, nausea and vomiting.  Genitourinary:  Negative for bladder incontinence, difficulty urinating, dysuria, frequency, hematuria and nocturia.   Musculoskeletal:  Negative for arthralgias, back pain, flank pain, myalgias and neck pain.  Skin:  Negative for itching and rash.  Neurological:  Positive for numbness. Negative for dizziness and headaches.  Hematological:  Does not bruise/bleed easily.  Psychiatric/Behavioral:  Positive for sleep disturbance. Negative for depression and suicidal ideas. The patient is  not nervous/anxious.   All other systems reviewed and are negative.    VITALS:   There were no vitals taken for this visit.  Wt Readings from Last 3 Encounters:  03/16/23 230 lb (104.3 kg)  01/05/23 229 lb 12.8 oz (104.2 kg)  11/11/22 231 lb 12.8 oz (105.1 kg)    There is no height or weight on file to calculate BMI.  Performance status (ECOG): 1 - Symptomatic but completely ambulatory  PHYSICAL EXAM:   Physical Exam Vitals and nursing note reviewed. Exam conducted with a chaperone present.  Constitutional:      Appearance: Normal appearance.  Cardiovascular:     Rate and Rhythm: Normal rate and regular rhythm.     Pulses: Normal pulses.     Heart sounds: Normal heart sounds.  Pulmonary:      Effort: Pulmonary effort is normal.     Breath sounds: Normal breath sounds.  Abdominal:     Palpations: Abdomen is soft. There is no hepatomegaly, splenomegaly or mass.     Tenderness: There is no abdominal tenderness.  Musculoskeletal:     Right lower leg: No edema.     Left lower leg: No edema.  Lymphadenopathy:     Cervical: No cervical adenopathy.     Right cervical: No superficial, deep or posterior cervical adenopathy.    Left cervical: No superficial, deep or posterior cervical adenopathy.     Upper Body:     Right upper body: No supraclavicular or axillary adenopathy.     Left upper body: No supraclavicular or axillary adenopathy.  Neurological:     General: No focal deficit present.     Mental Status: He is alert and oriented to person, place, and time.  Psychiatric:        Mood and Affect: Mood normal.        Behavior: Behavior normal.     LABS:      Latest Ref Rng & Units 03/16/2023    2:21 PM 02/02/2023    2:48 PM 01/05/2023    9:12 AM  CBC  WBC 4.0 - 10.5 K/uL 8.6  9.1  10.0   Hemoglobin 13.0 - 17.0 g/dL 96.2  95.2  84.1   Hematocrit 39.0 - 52.0 % 35.5  36.6  36.9   Platelets 150 - 400 K/uL 302  326  378       Latest Ref Rng & Units 03/16/2023    2:21 PM 02/02/2023    2:48 PM 01/05/2023    9:12 AM  CMP  Glucose 70 - 99 mg/dL 324  401  027   BUN 8 - 23 mg/dL 16  16  12    Creatinine 0.61 - 1.24 mg/dL 2.53  6.64  4.03   Sodium 135 - 145 mmol/L 138  140  139   Potassium 3.5 - 5.1 mmol/L 4.3  4.0  3.6   Chloride 98 - 111 mmol/L 102  103  101   CO2 22 - 32 mmol/L 26  26  29    Calcium 8.9 - 10.3 mg/dL 8.9  9.7  9.0   Total Protein 6.5 - 8.1 g/dL 6.1  6.4    Total Bilirubin 0.3 - 1.2 mg/dL 0.5  0.5    Alkaline Phos 38 - 126 U/L 36  36    AST 15 - 41 U/L 15  14    ALT 0 - 44 U/L 11  11       No results found for: "CEA1", "CEA" / No results  found for: "CEA1", "CEA" No results found for: "PSA1" No results found for: "ZDG387" No results found for: "CAN125"   No results found for: "TOTALPROTELP", "ALBUMINELP", "A1GS", "A2GS", "BETS", "BETA2SER", "GAMS", "MSPIKE", "SPEI" Lab Results  Component Value Date   TIBC 323 05/20/2022   TIBC 325 12/15/2021   TIBC 268 10/15/2021   FERRITIN 133 05/20/2022   FERRITIN 117 12/15/2021   FERRITIN 330 10/15/2021   IRONPCTSAT 12 (L) 05/20/2022   IRONPCTSAT 10 (L) 12/15/2021   IRONPCTSAT 23 10/15/2021   No results found for: "LDH"   STUDIES:   No results found.

## 2023-03-22 NOTE — Written Directive (Cosign Needed)
  PLUVICTO  THERAPY   RADIOPHARMACEUTICAL: Lutetium 177 vipivotide tetraxetan (Pluvicto)     PRESCRIBED DOSE FOR ADMINISTRATION:  200 mCi   ROUTE OFADMINISTRATION:  IV   DIAGNOSIS:  Prostate Cancer    REFERRING PHYSICIAN: Dr. Ellin Saba    TREATMENT #: 4    ADDITIONAL PHYSICIAN COMMENTS/NOTES:   AUTHORIZED USER SIGNATURE & TIME STAMP: Patriciaann Clan, MD   03/23/23    9:09 AM

## 2023-03-23 ENCOUNTER — Ambulatory Visit (HOSPITAL_COMMUNITY)
Admission: RE | Admit: 2023-03-23 | Discharge: 2023-03-23 | Disposition: A | Discharge status: Home / Self Care | Payer: Medicare PPO | Source: Ambulatory Visit | Attending: Hematology | Admitting: Hematology

## 2023-03-23 DIAGNOSIS — C7951 Secondary malignant neoplasm of bone: Secondary | ICD-10-CM | POA: Diagnosis present

## 2023-03-23 DIAGNOSIS — C61 Malignant neoplasm of prostate: Secondary | ICD-10-CM | POA: Diagnosis present

## 2023-03-23 DIAGNOSIS — C7952 Secondary malignant neoplasm of bone marrow: Secondary | ICD-10-CM | POA: Insufficient documentation

## 2023-03-23 MED ORDER — SODIUM CHLORIDE 0.9 % IV SOLN: INTRAVENOUS | Status: AC

## 2023-03-23 MED ORDER — LUTETIUM LU 177 VIPIVOTIDE TET 1000 MBQ/ML IV SOLN
216.0000 | Freq: Once | INTRAVENOUS | Status: AC
  Administered 2023-03-23: 216 via INTRAVENOUS

## 2023-03-23 MED ORDER — SODIUM CHLORIDE 0.9 % IV BOLUS: 1000.0000 mL | Freq: Once | INTRAVENOUS | Status: DC

## 2023-03-23 NOTE — Progress Notes (Signed)
  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  Initial flush of the intravenous catheter was performed was sterile saline. The dose syringe was connected to the catheter and the Lu-177 Pluvicto administered over a 1 to 10 min infusion. Single 10 cc  lushes with normal saline follow the dose. No complications were noted. The entire IV tubing, venocatheter, stopcock and syringes was removed in total, placed in a disposal bag and sent for assay of the residual activity, which will be reported at a later time in our EMR by the physics staff. Pressure was applied to the venipuncture site, and a compression bandage placed. Patient monitored for 1 hour following infusion.    Radiation Safety personnel were present to perform the discharge survey, as detailed on their documentation. After a short period of observation, the patient had his IV removed.  RADIOPHARMACEUTICALS: [21.6] microcuries Lu-177 PLUVICTO  FINDINGS: Current Infusion: [4]  Planned Infusions: 6    Patient presented to nuclear medicine for treatment. The patient's most recent blood counts were reviewed and remains a good candidate to proceed with Lu-177 Pluvicto.     Mild anemia (hemoglobin 11.1) unchanged.     Normal renal function.     PSA continues to decrease.  PSA equal 10.82       The patient was situated in an infusion suite with a contact barrier placed under the arm. Intravenous access was established, using sterile technique, and a normal saline infusion from a syringe was started.     Micro-dosimetry: The prescribed radiation activity was assayed and confirmed to be  within specified tolerance.  IMPRESSION: Current Infusion: [4]  Planned Infusions: 6    [The patient tolerated the infusion well. The patient will return in 6 weeks for ongoing care.]

## 2023-03-29 ENCOUNTER — Inpatient Hospital Stay: Payer: Medicare PPO

## 2023-04-05 ENCOUNTER — Other Ambulatory Visit (HOSPITAL_COMMUNITY): Payer: No Typology Code available for payment source

## 2023-04-26 NOTE — Progress Notes (Signed)
[facility] [address], [place] [address]    Clinic Day:  04/27/23   Referring physician: Clinic, [name]  Patient Care Team: Clinic, [name] as PCP - General [name], MD as Medical Oncologist (Medical Oncology) Margaretmary Dys, MD as Consulting Physician (Radiation Oncology)   ASSESSMENT & PLAN:   Assessment: 1.  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 -Abiraterone and prednisone from 07/13/2016 through 04/09/2019, discontinued secondary to PSA progression. -Enzalutamide from 04/12/2019 through 11/03/2019 with progression. -Last Lupron on 06/09/2019. -CT CAP on 11/10/2019 shows no findings of active malignancy.  Stable small sclerotic lesions at T4 and T10, nonspecific.  Right hydronephrosis with right proximal hydroureter extending down to a 0.8 x 0.8 x 0.4 cm proximal ureteral stone at L3 vertical level.  Nonobstructive right and possibly left nephrolithiasis. -Bone scan on 11/10/2019 shows right proximal tibial metaphyseal activity from recent trauma.  Degenerative findings in the spine, right sternoclavicular joint, shoulders and right foot.  No evidence of metastatic disease. -Foundation 1 test shows MS-stable.  AR amplification.  Sensitivity is reduced due to sample quality. -Guardant 360 on 12/12/2019 MSI high not detected.  TMB was not evaluable.  No other targetable mutations. -F-18-PYLARIFY PET scan showed intense radiotracer activity in the left supraclavicular and prevascular lymph nodes, measuring 10 mm.  Cluster of small prevascular lymph nodes measures 5-7 mm each with SUV 54.  AP window lymph node measuring 8 mm.  No activity in the prostate gland or abdominal or pelvic lymph nodes.  No skeletal activity. - SBRT to the left supraclavicular lymph node and prevascular mediastinal  lymph node from 08/27/2020 through 09/06/2020, 40 Gray in 5 fractions. - CT CAP (10/22/2021): Done at [facility]: No lymphadenopathy.  Cirrhosis.  Intrahepatic and extrahepatic biliary ductal dilatation. - Bone scan (10/28/2021): New focal abnormal tracer uptake at the inferior aspect of the right scapula. - PSMA PET scan (11/13/2021): Interval development of multifocal tracer avid bone metastasis, only 1 of these lesions is visible on the recent nuclear medicine bone scan dated 10/28/2021.  New tracer avid right axillary, subcarinal, left paratracheal, left retrocrural and upper abdominal lymph nodes. - 13 cycles of cabazitaxel from 12/15/2021 through 09/08/2022 with progression - Pluvicto cycle 1 on 11/16/2022, cycle 2 on 01/12/2023, cycle 3 on 02/09/2023, cycle 4 on 03/23/2023   2.  Androgen deprivation therapy induced bone loss: -Received Zometa every 3 months for a year completed on 10/29/2017.   3.  Genetic testing: -Germline mutation testing was negative.    Plan: 1.  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 - Continue to have dry mouth and uses hard candy which helps.  Feels tired for about 3 to 4 days after each treatment.  No GI side effects noted. - Labs today: Normal LFTs and creatinine.  CBC grossly normal.  PSA has improved to 10.82 from 21.21.  PSA from today is pending. - He will receive Eligard 45 mg injection today. - He may proceed with cycle 5 on 05/04/2023.  RTC 6 weeks for follow-up.   2.  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  He will proceed with denosumab injection today.   3.  Leg swelling: - Continue Bumex half tablet daily.  Mild swelling is stable.   4.  Peripheral neuropathy: - Numbness  in the feet is stable.  5.  Hypomagnesemia: - Continue magnesium twice daily.    No orders of the defined types were placed in this encounter.     Alben Deeds Teague,acting as  a Neurosurgeon for [name], MD.,have documented all relevant documentation on the behalf of [name], MD,as directed by  [name], MD while in the presence of [name], MD.  I, [name] MD, have reviewed the above documentation for accuracy and completeness, and I agree with the above.      [name], MD   12/3/202412:48 PM  CHIEF COMPLAINT:   Diagnosis: metastatic prostate cancer to intrathoracic lymph node    Cancer Staging  No matching staging information was found for the patient.    Prior Therapy: 1. Docetaxel x 14 cycles from 08/11/2015 through 06/12/2016. 2. Abiraterone from 07/13/2016 through 04/09/2019. 3. Enzalutamide from 04/12/2019 to 11/03/2019. 4. SBRT to nodal mets, 08/27/20 - 09/06/20 5. ADT (Lupron/Eligard) through 08/06/21 6. Cabazitaxel, 13 cycles, 12/15/21 - 09/08/22  Current Therapy:  Pluvicto    HISTORY OF PRESENT ILLNESS:   Oncology History  Prostate cancer metastatic to intrathoracic lymph node (HCC)  07/24/2015 Initial Diagnosis   Prostate cancer metastatic to the left supraclavicular and anterior mediastinal lymph nodes   08/21/2015 - 06/12/2016 Chemotherapy   Docetaxel every 3 weeks for 14 cycles, discontinued as PSA has plateaued around 11    07/13/2016 Treatment Plan Change   Zytiga 1000 milligrams daily along with prednisone 5 mg daily, started secondary to fluid overload from docetaxel   10/14/2018 Genetic Testing   Negative genetic testing.  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  The Common Hereditary Gene Panel offered by [facility] includes sequencing and/or deletion duplication testing of the following 48 genes: APC, ATM, AXIN2, BARD1, BMPR1A, BRCA1, BRCA2, BRIP1, CDH1, CDK4, CDKN2A (p14ARF), CDKN2A (p16INK4a), CHEK2, CTNNA1, DICER1, EPCAM (Deletion/duplication testing only), GREM1 (promoter region deletion/duplication testing only), KIT, MEN1, MLH1, MSH2, MSH3, MSH6, MUTYH, NBN, NF1, NHTL1,  PALB2, PDGFRA, PMS2, POLD1, POLE, PTEN, RAD50, RAD51C, RAD51D, RNF43, SDHB, SDHC, SDHD, SMAD4, SMARCA4. STK11, TP53, TSC1, TSC2, and VHL.  The following genes were evaluated for sequence changes only: SDHA and HOXB13 c.251G>A variant only. The report date is Oct 14, 2018.   09/18/2019 Genetic Testing   Foundation One CDx      12/12/2019 Genetic Testing   Guardant 360       12/15/2021 - 01/06/2022 Chemotherapy   Patient is on Treatment Plan : PROSTATE Cabazitaxel + Prednisone q21d     12/15/2021 -  Chemotherapy   Patient is on Treatment Plan : PROSTATE Cabazitaxel (20) D1 + Prednisone D1-21 q21d        INTERVAL HISTORY:   Andre [name] is a 82 y.o. male presenting to clinic today for follow up of metastatic prostate cancer to intrathoracic lymph node. He was last seen by me on 03/16/23.  Today, he states that he is doing well overall. His appetite level is at 100%. His energy level is at 75%. He is accompanied by his wife.  He notes a normal appetite. He reports mild dry mouth. He also notes tiredness that lasts 3 days after treatment. He is taking Potassium as prescribed and would like a refill of this medication.  PAST MEDICAL HISTORY:   Past Medical History: Past Medical History:  Diagnosis Date   COPD (chronic obstructive pulmonary disease) (HCC)    Diabetes mellitus without complication (HCC)    Family history of ovarian cancer    Family history  of prostate cancer    Hypertension    Prostate cancer [facility])     Surgical History: Past Surgical History:  Procedure Laterality Date   GOLD SEED IMPLANT      Social History: Social History   Socioeconomic History   Marital status: Widowed    Spouse name: Not on file   Number of children: Not on file   Years of education: Not on file   Highest education level: Not on file  Occupational History   Not on file  Tobacco Use   Smoking status: Former    Current packs/day: 0.00    Average packs/day: 0.3 packs/day for 60.0 years  (15.0 ttl pk-yrs)    Types: Cigarettes    Start date: 06/25/1954    Quit date: 06/25/2014    Years since quitting: 8.8   Smokeless tobacco: Never   Tobacco comments:    smoked since young age, quit in 2016  Substance and Sexual Activity   Alcohol use: Not Currently   Drug use: No   Sexual activity: Not Currently  Other Topics Concern   Not on file  Social History Narrative   Not on file   Social Determinants of Health   Financial Resource Strain: Low Risk  (08/08/2020)   Received from [facility], [facility]   Overall Financial Resource Strain (CARDIA)    Difficulty of Paying Living Expenses: Not hard at all  Food Insecurity: No Food Insecurity (08/08/2020)   Received from [facility], [facility]   Hunger Vital Sign    Worried About Running Out of Food in the Last Year: Never true    Ran Out of Food in the Last Year: Never true  Transportation Needs: No Transportation Needs (08/08/2020)   Received from [facility], [facility]   [facility] - Transportation    Lack of Transportation (Medical): No    Lack of Transportation (Non-Medical): No  Physical Activity: Insufficiently Active (05/21/2020)   Exercise Vital Sign    Days of Exercise per Week: 2 days    Minutes of Exercise per Session: 20 min  Stress: No Stress Concern Present (05/21/2020)   [employer] of Occupational Health - Occupational Stress Questionnaire    Feeling of Stress : Not at all  Social Connections: Moderately Isolated (05/21/2020)   Social Connection and Isolation Panel [NHANES]    Frequency of Communication with Friends and Family: More than three times a week    Frequency of Social Gatherings with Friends and Family: More than three times a week    Attends Religious Services: 1 to 4 times per year    Active Member of [employer] or Organizations: No    Attends Banker Meetings: Never    Marital Status: Widowed  Intimate Partner Violence: Not At Risk (05/21/2020)    Humiliation, Afraid, Rape, and Kick questionnaire    Fear of Current or Ex-Partner: No    Emotionally Abused: No    Physically Abused: No    Sexually Abused: No    Family History: Family History  Problem Relation Age of Onset   Ovarian cancer Mother 17   Heart attack Father    Prostate cancer Maternal Uncle 85   Cancer Cousin        pat cousin with unknown cancer   Colon cancer Neg Hx    Pancreatic cancer Neg Hx    Breast cancer Neg Hx     Current Medications:  Current Outpatient Medications:  ACCU-CHEK GUIDE test strip, , Disp: , Rfl:    Accu-Chek Softclix Lancets lancets, , Disp: , Rfl:    acetaminophen (TYLENOL) 500 MG tablet, Take 500 mg by mouth every 6 (six) hours as needed for moderate pain., Disp: , Rfl:    albuterol (ACCUNEB) 0.63 MG/3ML nebulizer solution, Take 1 ampule by nebulization every 6 (six) hours as needed for wheezing., Disp: , Rfl:    albuterol (PROVENTIL) (2.5 MG/3ML) 0.083% nebulizer solution, INHALE 3 ML IN NEBULIZER BY MOUTH FOUR TIMES A DAY AS NEEDED, Disp: , Rfl:    amLODipine (NORVASC) 2.5 MG tablet, Take 1 tablet (2.5 mg total) by mouth daily as needed., Disp: 30 tablet, Rfl: 0   aspirin 81 MG EC tablet, Take 1 tablet by mouth daily., Disp: , Rfl:    atorvastatin (LIPITOR) 10 MG tablet, TAKE 1 TABLET BY MOUTH EVERY DAY (Patient taking differently: Take 5 mg by mouth daily.), Disp: 90 tablet, Rfl: 3   azelastine (ASTELIN) 0.1 % nasal spray, USE 2 SPRAYS IN EACH NOSTRIL TWICE A DAY FOR ALLERGIC RHINITIS, Disp: , Rfl:    Blood Glucose Monitoring Suppl (ACCU-CHEK GUIDE) w/Device KIT, , Disp: , Rfl:    brompheniramine-pseudoephedrine-DM 30-2-10 MG/5ML syrup, TAKE 5 ML BY MOUTH EVERY 4 HOURS AS NEEDED FOR COUGH, Disp: 120 mL, Rfl: 0   bumetanide (BUMEX) 1 MG tablet, TAKE 1 AND 1/2 TABLETS(1.5 MG) BY MOUTH DAILY, Disp: 45 tablet, Rfl: 6   CALCIUM PO, Take 600 mg by mouth daily. , Disp: , Rfl:    cetirizine (ZYRTEC) 10 MG tablet, Take 1 tablet by mouth  daily., Disp: , Rfl:    CHERATUSSIN AC 100-10 MG/5ML syrup, Take 5 mLs by mouth daily as needed. , Disp: , Rfl: 0   Cholecalciferol (VITAMIN D3) 50 MCG (2000 UT) capsule, Take 2,000 Units by mouth daily., Disp: , Rfl:    dicyclomine (BENTYL) 20 MG tablet, Take 1 tablet by mouth 3 (three) times daily as needed., Disp: , Rfl:    dorzolamide (TRUSOPT) 2 % ophthalmic solution, Place 1 drop into both eyes 3 (three) times daily., Disp: , Rfl:    erythromycin ophthalmic ointment, 3 (three) times daily., Disp: , Rfl:    ferrous sulfate 325 (65 FE) MG tablet, Take 1 tablet by mouth daily., Disp: , Rfl:    fexofenadine (ALLEGRA) 180 MG tablet, Take 1 tablet every day by oral route at bedtime for 90 days., Disp: , Rfl:    fluticasone (FLONASE) 50 MCG/ACT nasal spray, USE 2 SPRAY(S) IN EACH NOSTRIL ONCE DAILY FOR 30 DAYS, Disp: 15.8 mL, Rfl: 12   ibuprofen (ADVIL,MOTRIN) 600 MG tablet, Take 600 mg by mouth every 6 (six) hours as needed. , Disp: , Rfl: 0   ipratropium (ATROVENT) 0.03 % nasal spray, USE 2 SPRAYS IN EACH NOSTRIL TWICE DAILY AS NEEDED, Disp: , Rfl:    Ipratropium-Albuterol (COMBIVENT) 20-100 MCG/ACT AERS respimat, INHALE 1 PUFF BY MOUTH FOUR TIMES A DAY AS NEEDED COPD, Disp: , Rfl:    latanoprost (XALATAN) 0.005 % ophthalmic solution, INSTILL 1 DROP INTO AFFECTED EYE(S) BY OPHTHALMIC ROUTE ONCE DAILY INTHE EVENING, Disp: , Rfl:    Latanoprostene Bunod (VYZULTA) 0.024 % SOLN, Apply to eye., Disp: , Rfl:    Leuprolide Acetate, 4 Month, (ELIGARD) 30 MG injection, 30 MG SUBCUTANEOUSLY Q90D PRN, Disp: , Rfl:    losartan (COZAAR) 100 MG tablet, Take 1 tablet by mouth daily., Disp: , Rfl:    magnesium oxide (MAG-OX) 400 MG tablet, Take 1 tablet (  400 mg total) by mouth daily. (Patient taking differently: Take 400 mg by mouth 2 (two) times daily.), Disp: 90 tablet, Rfl: 2   metFORMIN (GLUCOPHAGE) 500 MG tablet, Take 2 tablets (1,000 mg total) by mouth 2 (two) times daily with a meal., Disp: 120 tablet, Rfl:  3   montelukast (SINGULAIR) 10 MG tablet, Take 10 mg by mouth at bedtime., Disp: , Rfl:    Multiple Vitamins-Minerals (PRESERVISION AREDS PO), 1 tablet daily., Disp: , Rfl:    predniSONE (DELTASONE) 5 MG tablet, TAKE 1 TABLET BY MOUTH DAILY WITH BREAKFAST, Disp: 30 tablet, Rfl: 3   prochlorperazine (COMPAZINE) 10 MG tablet, Take 1 tablet (10 mg total) by mouth every 6 (six) hours as needed for nausea or vomiting., Disp: 60 tablet, Rfl: 3   sucralfate (CARAFATE) 1 g tablet, Take 1 tablet by mouth 4 (four) times daily as needed., Disp: , Rfl:    TRELEGY ELLIPTA 100-62.5-25 MCG/INH AEPB, INHALE 1 PUFF ONCE DAILY, Disp: , Rfl:    vitamin C (ASCORBIC ACID) 500 MG tablet, Take 500 mg by mouth daily., Disp: , Rfl:    potassium chloride (MICRO-K) 10 MEQ CR capsule, TAKE 4 CAPSULE BY MOUTH DAILY WITH FOOD, OPEN AND SPRINKLE ON FOOD, Disp: 348 capsule, Rfl: 2 No current facility-administered medications for this visit.  Facility-Administered Medications Ordered in Other Visits:    denosumab (XGEVA) 120 MG/1.7ML injection, , , ,    diphenhydrAMINE (BENADRYL) 50 MG/ML injection, , , ,    famotidine (PEPCID) 20-0.9 MG/50ML-% IVPB, , , ,    Allergies: Allergies  Allergen Reactions   Lisinopril Cough   Metoprolol Other (See Comments) and Cough    Increases frequency of cough Other reaction(s): Other (See Comments) Increases frequency of cough   Semaglutide Nausea And Vomiting    REVIEW OF SYSTEMS:   Review of Systems  Constitutional:  Negative for chills, fatigue and fever.  HENT:   Negative for lump/mass, mouth sores, nosebleeds, sore throat and trouble swallowing.        +dry mouth  Eyes:  Negative for eye problems.  Respiratory:  Positive for cough and shortness of breath.   Cardiovascular:  Negative for chest pain, leg swelling and palpitations.  Gastrointestinal:  Positive for constipation and diarrhea. Negative for abdominal pain, nausea and vomiting.  Genitourinary:  Negative for bladder  incontinence, difficulty urinating, dysuria, frequency, hematuria and nocturia.   Musculoskeletal:  Negative for arthralgias, back pain, flank pain, myalgias and neck pain.  Skin:  Negative for itching and rash.  Neurological:  Positive for numbness (in fingers and feet). Negative for dizziness and headaches.  Hematological:  Does not bruise/bleed easily.  Psychiatric/Behavioral:  Negative for depression, sleep disturbance and suicidal ideas. The patient is not nervous/anxious.   All other systems reviewed and are negative.    VITALS:   Weight 230 lb 12.8 oz (104.7 kg).  Wt Readings from Last 3 Encounters:  04/27/23 230 lb 12.8 oz (104.7 kg)  03/16/23 230 lb (104.3 kg)  01/05/23 229 lb 12.8 oz (104.2 kg)    Body mass index is 35.09 kg/m.  Performance status (ECOG): 1 - Symptomatic but completely ambulatory  PHYSICAL EXAM:   Physical Exam Vitals and nursing note reviewed. Exam conducted with a chaperone present.  Constitutional:      Appearance: Normal appearance.  Cardiovascular:     Rate and Rhythm: Normal rate and regular rhythm.     Pulses: Normal pulses.     Heart sounds: Normal heart sounds.  Pulmonary:  Effort: Pulmonary effort is normal.     Breath sounds: Normal breath sounds.  Abdominal:     Palpations: Abdomen is soft. There is no hepatomegaly, splenomegaly or mass.     Tenderness: There is no abdominal tenderness.  Musculoskeletal:     Right lower leg: No edema.     Left lower leg: No edema.  Lymphadenopathy:     Cervical: No cervical adenopathy.     Right cervical: No superficial, deep or posterior cervical adenopathy.    Left cervical: No superficial, deep or posterior cervical adenopathy.     Upper Body:     Right upper body: No supraclavicular or axillary adenopathy.     Left upper body: No supraclavicular or axillary adenopathy.  Neurological:     General: No focal deficit present.     Mental Status: He is alert and oriented to person, place, and  time.  Psychiatric:        Mood and Affect: Mood normal.        Behavior: Behavior normal.     LABS:      Latest Ref Rng & Units 04/27/2023    9:58 AM 03/16/2023    2:21 PM 02/02/2023    2:48 PM  CBC  WBC 4.0 - 10.5 K/uL 8.5  8.6  9.1   Hemoglobin 13.0 - 17.0 g/dL 40.9  81.1  91.4   Hematocrit 39.0 - 52.0 % 38.0  35.5  36.6   Platelets 150 - 400 K/uL 295  302  326       Latest Ref Rng & Units 04/27/2023    9:58 AM 03/16/2023    2:21 PM 02/02/2023    2:48 PM  CMP  Glucose 70 - 99 mg/dL 782  956  213   BUN 8 - 23 mg/dL 25  16  16    Creatinine 0.61 - 1.24 mg/dL 0.86  5.78  4.69   Sodium 135 - 145 mmol/L 140  138  140   Potassium 3.5 - 5.1 mmol/L 4.0  4.3  4.0   Chloride 98 - 111 mmol/L 101  102  103   CO2 22 - 32 mmol/L 28  26  26    Calcium 8.9 - 10.3 mg/dL 9.5  8.9  9.7   Total Protein 6.5 - 8.1 g/dL 6.7  6.1  6.4   Total Bilirubin <1.2 mg/dL 0.4  0.5  0.5   Alkaline Phos 38 - 126 U/L 35  36  36   AST 15 - 41 U/L 14  15  14    ALT 0 - 44 U/L 12  11  11       No results found for: "CEA1", "CEA" / No results found for: "CEA1", "CEA" No results found for: "PSA1" No results found for: "GEX528" No results found for: "CAN125"  No results found for: "TOTALPROTELP", "ALBUMINELP", "A1GS", "A2GS", "BETS", "BETA2SER", "GAMS", "MSPIKE", "SPEI" Lab Results  Component Value Date   TIBC 323 05/20/2022   TIBC 325 12/15/2021   TIBC 268 10/15/2021   FERRITIN 133 05/20/2022   FERRITIN 117 12/15/2021   FERRITIN 330 10/15/2021   IRONPCTSAT 12 (L) 05/20/2022   IRONPCTSAT 10 (L) 12/15/2021   IRONPCTSAT 23 10/15/2021   No results found for: "LDH"   STUDIES:   No results found.

## 2023-04-27 ENCOUNTER — Inpatient Hospital Stay: Payer: Medicare PPO

## 2023-04-27 ENCOUNTER — Inpatient Hospital Stay (HOSPITAL_BASED_OUTPATIENT_CLINIC_OR_DEPARTMENT_OTHER): Payer: Medicare PPO | Admitting: Hematology

## 2023-04-27 ENCOUNTER — Inpatient Hospital Stay: Payer: Medicare PPO | Attending: Hematology

## 2023-04-27 VITALS — Wt 230.8 lb

## 2023-04-27 DIAGNOSIS — Z5111 Encounter for antineoplastic chemotherapy: Secondary | ICD-10-CM | POA: Diagnosis present

## 2023-04-27 DIAGNOSIS — Z87891 Personal history of nicotine dependence: Secondary | ICD-10-CM | POA: Insufficient documentation

## 2023-04-27 DIAGNOSIS — C61 Malignant neoplasm of prostate: Secondary | ICD-10-CM

## 2023-04-27 DIAGNOSIS — Z7982 Long term (current) use of aspirin: Secondary | ICD-10-CM | POA: Insufficient documentation

## 2023-04-27 DIAGNOSIS — C771 Secondary and unspecified malignant neoplasm of intrathoracic lymph nodes: Secondary | ICD-10-CM | POA: Diagnosis not present

## 2023-04-27 DIAGNOSIS — C7951 Secondary malignant neoplasm of bone: Secondary | ICD-10-CM | POA: Diagnosis present

## 2023-04-27 DIAGNOSIS — Z79899 Other long term (current) drug therapy: Secondary | ICD-10-CM | POA: Insufficient documentation

## 2023-04-27 LAB — CBC WITH DIFFERENTIAL/PLATELET
Abs Immature Granulocytes: 0.03 10*3/uL (ref 0.00–0.07)
Basophils Absolute: 0.1 10*3/uL (ref 0.0–0.1)
Basophils Relative: 1 %
Eosinophils Absolute: 0.1 10*3/uL (ref 0.0–0.5)
Eosinophils Relative: 1 %
HCT: 38 % — ABNORMAL LOW (ref 39.0–52.0)
Hemoglobin: 11.7 g/dL — ABNORMAL LOW (ref 13.0–17.0)
Immature Granulocytes: 0 %
Lymphocytes Relative: 8 %
Lymphs Abs: 0.7 10*3/uL (ref 0.7–4.0)
MCH: 27 pg (ref 26.0–34.0)
MCHC: 30.8 g/dL (ref 30.0–36.0)
MCV: 87.6 fL (ref 80.0–100.0)
Monocytes Absolute: 0.9 10*3/uL (ref 0.1–1.0)
Monocytes Relative: 10 %
Neutro Abs: 6.8 10*3/uL (ref 1.7–7.7)
Neutrophils Relative %: 80 %
Platelets: 295 10*3/uL (ref 150–400)
RBC: 4.34 MIL/uL (ref 4.22–5.81)
RDW: 14.3 % (ref 11.5–15.5)
WBC: 8.5 10*3/uL (ref 4.0–10.5)
nRBC: 0 % (ref 0.0–0.2)

## 2023-04-27 LAB — COMPREHENSIVE METABOLIC PANEL
ALT: 12 U/L (ref 0–44)
AST: 14 U/L — ABNORMAL LOW (ref 15–41)
Albumin: 3.7 g/dL (ref 3.5–5.0)
Alkaline Phosphatase: 35 U/L — ABNORMAL LOW (ref 38–126)
Anion gap: 11 (ref 5–15)
BUN: 25 mg/dL — ABNORMAL HIGH (ref 8–23)
CO2: 28 mmol/L (ref 22–32)
Calcium: 9.5 mg/dL (ref 8.9–10.3)
Chloride: 101 mmol/L (ref 98–111)
Creatinine, Ser: 0.98 mg/dL (ref 0.61–1.24)
GFR, Estimated: >60 mL/min (ref >60)
Glucose, Bld: 141 mg/dL — ABNORMAL HIGH (ref 70–99)
Potassium: 4 mmol/L (ref 3.5–5.1)
Sodium: 140 mmol/L (ref 135–145)
Total Bilirubin: 0.4 mg/dL (ref <1.2)
Total Protein: 6.7 g/dL (ref 6.5–8.1)

## 2023-04-27 LAB — PSA: Prostatic Specific Antigen: 13.31 ng/mL — ABNORMAL HIGH (ref 0.00–4.00)

## 2023-04-27 MED ORDER — POTASSIUM CHLORIDE ER 10 MEQ PO CPCR: ORAL_CAPSULE | ORAL | 2 refills | Status: AC

## 2023-04-27 MED ORDER — SODIUM CHLORIDE 0.9% FLUSH
10.0000 mL | INTRAVENOUS | Status: AC
  Administered 2023-04-27: 10 mL

## 2023-04-27 MED ORDER — HEPARIN SOD (PORK) LOCK FLUSH 100 UNIT/ML IV SOLN
500.0000 [IU] | Freq: Once | INTRAVENOUS | Status: AC
  Administered 2023-04-27: 500 [IU] via INTRAVENOUS

## 2023-04-27 MED ORDER — LEUPROLIDE ACETATE (6 MONTH) 45 MG ~~LOC~~ KIT
45.0000 mg | PACK | Freq: Once | SUBCUTANEOUS | Status: AC
  Administered 2023-04-27: 45 mg via SUBCUTANEOUS
  Filled 2023-04-27: qty 45

## 2023-04-27 MED ORDER — DENOSUMAB 120 MG/1.7ML ~~LOC~~ SOLN
120.0000 mg | Freq: Once | SUBCUTANEOUS | Status: AC
  Administered 2023-04-27: 120 mg via SUBCUTANEOUS
  Filled 2023-04-27: qty 1.7

## 2023-04-27 NOTE — Progress Notes (Signed)
Andre [name] presented for Portacath access and flush.  Portacath located right chest wall accessed with  H 20 needle.  Good blood return present. Labs drawn.  Portacath flushed with 20ml NS and 500U/37ml Heparin and needle removed intact.  Procedure tolerated well and without incident. Discharged from clinic by wheel chair and placed in waiting room for f/u appointment in stable condition. Alert and oriented x 3. F/U with [facility] as scheduled.

## 2023-04-27 NOTE — Patient Instructions (Addendum)
[facility] at [facility] Discharge Instructions   You were seen and examined today by Dr. Ellin Saba.  He reviewed the results of your lab work which are normal/stable. PSA is pending.   Return as scheduled.    Thank you for choosing  Cancer Center at [facility] to provide your oncology and hematology care.  To afford each patient quality time with our provider, please arrive at least 15 minutes before your scheduled appointment time.   If you have a lab appointment with the Cancer Center please come in thru the Main Entrance and check in at the main information desk.  You need to re-schedule your appointment should you arrive 10 or more minutes late.  We strive to give you quality time with our providers, and arriving late affects you and other patients whose appointments are after yours.  Also, if you no show three or more times for appointments you may be dismissed from the clinic at the providers discretion.     Again, thank you for choosing [facility].  Our hope is that these requests will decrease the amount of time that you wait before being seen by our physicians.       _____________________________________________________________  Should you have questions after your visit to [facility], please contact our office at [phone] and follow the prompts.  Our office hours are 8:00 a.m. and 4:30 p.m. Monday - Friday.  Please note that voicemails left after 4:00 p.m. may not be returned until the following business day.  We are closed weekends and major holidays.  You do have access to a nurse 24-7, just call the main number to the clinic [phone] and do not press any options, hold on the line and a nurse will answer the phone.    For prescription refill requests, have your pharmacy contact our office and allow 72 hours.    Due to Covid, you will need to wear a mask upon entering the hospital. If you do not have  a mask, a mask will be given to you at the Main Entrance upon arrival. For doctor visits, patients may have 1 support person age 16 or older with them. For treatment visits, patients can not have anyone with them due to social distancing guidelines and our immunocompromised population.

## 2023-04-27 NOTE — Patient Instructions (Signed)
[facility] [place] - A DE[facility]  Discharge Instructions: Thank you for choosing [facility] to provide your oncology and hematology care.  If you have a lab appointment with the Cancer Center - please note that after April 8th, 2024, all labs will be drawn in the cancer center.  You do not have to check in or register with the main entrance as you have in the past but will complete your check-in in the cancer center.  Wear comfortable clothing and clothing appropriate for easy access to any Portacath or PICC line.   We strive to give you quality time with your provider. You may need to reschedule your appointment if you arrive late (15 or more minutes).  Arriving late affects you and other patients whose appointments are after yours.  Also, if you miss three or more appointments without notifying the office, you may be dismissed from the clinic at the provider's discretion.      For prescription refill requests, have your pharmacy contact our office and allow 72 hours for refills to be completed.    Today you received the following chemotherapy and/or immunotherapy agents Port flush with labs and f/u visit with Dr. Ellin Saba.      To help prevent nausea and vomiting after your treatment, we encourage you to take your nausea medication as directed.  BELOW ARE SYMPTOMS THAT SHOULD BE REPORTED IMMEDIATELY: *FEVER GREATER THAN 100.4 F (38 C) OR HIGHER *CHILLS OR SWEATING *NAUSEA AND VOMITING THAT IS NOT CONTROLLED WITH YOUR NAUSEA MEDICATION *UNUSUAL SHORTNESS OF BREATH *UNUSUAL BRUISING OR BLEEDING *URINARY PROBLEMS (pain or burning when urinating, or frequent urination) *BOWEL PROBLEMS (unusual diarrhea, constipation, pain near the anus) TENDERNESS IN MOUTH AND THROAT WITH OR WITHOUT PRESENCE OF ULCERS (sore throat, sores in mouth, or a toothache) UNUSUAL RASH, SWELLING OR PAIN  UNUSUAL VAGINAL DISCHARGE OR ITCHING   Items with * indicate a  potential emergency and should be followed up as soon as possible or go to the Emergency Department if any problems should occur.  Please show the CHEMOTHERAPY ALERT CARD or IMMUNOTHERAPY ALERT CARD at check-in to the Emergency Department and triage nurse.  Should you have questions after your visit or need to cancel or reschedule your appointment, please contact [facility] [place] - A DEPT OF Eligha Bridegroom [facility] [phone]  and follow the prompts.  Office hours are 8:00 a.m. to 4:30 p.m. Monday - Friday. Please note that voicemails left after 4:00 p.m. may not be returned until the following business day.  We are closed weekends and major holidays. You have access to a nurse at all times for urgent questions. Please call the main number to the clinic [phone] and follow the prompts.  For any non-urgent questions, you may also contact your provider using MyChart. We now offer e-Visits for anyone 29 and older to request care online for non-urgent symptoms. For details visit [email or web address].   Also download the MyChart app! Go to the app store, search "MyChart", open the app, select [place], and log in with your MyChart username and password.

## 2023-04-27 NOTE — Progress Notes (Signed)
Xgeva and Eligard injections given per orders. Patient tolerated it well without problems. Vitals stable and discharged home from clinic ambulatory. Follow up as scheduled.

## 2023-04-28 ENCOUNTER — Other Ambulatory Visit: Payer: Self-pay

## 2023-05-03 NOTE — Written Directive (Cosign Needed)
  PLUVICTO  THERAPY   RADIOPHARMACEUTICAL: Lutetium 177 vipivotide tetraxetan (Pluvicto)     PRESCRIBED DOSE FOR ADMINISTRATION:  200 mCi   ROUTE OFADMINISTRATION:  IV   DIAGNOSIS:  Prostate Cancer    REFERRING PHYSICIAN: Dr. Ellin Saba    TREATMENT #: 5    ADDITIONAL PHYSICIAN COMMENTS/NOTES:   AUTHORIZED USER SIGNATURE & TIME STAMP: .Suella Broad

## 2023-05-04 ENCOUNTER — Other Ambulatory Visit: Payer: Self-pay | Admitting: *Deleted

## 2023-05-04 ENCOUNTER — Encounter (HOSPITAL_COMMUNITY): Payer: Self-pay | Admitting: Hematology

## 2023-05-04 ENCOUNTER — Encounter (HOSPITAL_COMMUNITY)
Admission: RE | Admit: 2023-05-04 | Discharge: 2023-05-04 | Disposition: A | Discharge status: Home / Self Care | Payer: No Typology Code available for payment source | Source: Ambulatory Visit | Attending: Hematology | Admitting: Hematology

## 2023-05-04 ENCOUNTER — Encounter: Payer: Self-pay | Admitting: Hematology

## 2023-05-04 DIAGNOSIS — C61 Malignant neoplasm of prostate: Secondary | ICD-10-CM

## 2023-05-04 DIAGNOSIS — C7952 Secondary malignant neoplasm of bone marrow: Secondary | ICD-10-CM | POA: Insufficient documentation

## 2023-05-04 DIAGNOSIS — C7951 Secondary malignant neoplasm of bone: Secondary | ICD-10-CM

## 2023-05-04 MED ORDER — SODIUM CHLORIDE 0.9 % IV BOLUS: 1000.0000 mL | Freq: Once | INTRAVENOUS | Status: DC

## 2023-05-04 MED ORDER — LUTETIUM LU 177 VIPIVOTIDE TET 1000 MBQ/ML IV SOLN
201.8000 | Freq: Once | INTRAVENOUS | Status: AC
  Administered 2023-05-04: 201.8 via INTRAVENOUS

## 2023-05-04 NOTE — Progress Notes (Signed)
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  Initial flush of the intravenous catheter was performed was sterile saline. The dose syringe was connected to the catheter and the Lu-177 Pluvicto administered over a 1 to 10 min infusion. Single 10 cc  lushes with normal saline follow the dose. No complications were noted. The entire IV tubing, venocatheter, stopcock and syringes was removed in total, placed in a disposal bag and sent for assay of the residual activity, which will be reported at a later time in our EMR by the physics staff. Pressure was applied to the venipuncture site, and a compression bandage placed. Patient monitored for 1 hour following infusion.    Radiation Safety personnel were present to perform the discharge survey, as detailed on their documentation. After a short period of observation, the patient had his IV removed.  RADIOPHARMACEUTICALS: [201.8] microcuries Lu-177 PLUVICTO  FINDINGS: Current Infusion: [5]  Planned Infusions: 6    Patient presented to nuclear medicine for treatment.  Patient reports mild interval adverse effects including dry mouth and some fatigue following therapy.  Patient advised to hydrate following therapy.         The patient's most recent blood counts were reviewed and remains a good candidate to proceed with Lu-177 Pluvicto.     No evidence of myelosuppression, renal toxicity or hepatic toxicity.     Patient remains mildly anemic with Hgb = 11    Mild increase in PSA equal 13 compared to PSA equal 10.  Considerable decreased from PSA equal 70 prior therapy       The patient was situated in an infusion suite with a contact  barrier placed under the arm. Intravenous access was established, using sterile technique, and a normal saline infusion from a syringe was started.     Micro-dosimetry: The prescribed radiation activity was assayed and confirmed to be within specified tolerance.  IMPRESSION: Current Infusion: [5]  Planned Infusions: 6    [The patient tolerated the infusion well. The patient will return in 6 weeks for ongoing care.]

## 2023-05-04 NOTE — Progress Notes (Signed)
Pt tolerated treatment well.

## 2023-05-10 ENCOUNTER — Inpatient Hospital Stay: Payer: Medicare PPO

## 2023-05-17 ENCOUNTER — Other Ambulatory Visit (HOSPITAL_COMMUNITY): Payer: No Typology Code available for payment source

## 2023-06-08 ENCOUNTER — Inpatient Hospital Stay: Payer: Medicare PPO

## 2023-06-09 ENCOUNTER — Inpatient Hospital Stay: Payer: Medicare PPO

## 2023-06-09 ENCOUNTER — Inpatient Hospital Stay (HOSPITAL_BASED_OUTPATIENT_CLINIC_OR_DEPARTMENT_OTHER): Payer: Medicare PPO | Admitting: Hematology

## 2023-06-09 ENCOUNTER — Inpatient Hospital Stay: Payer: Medicare PPO | Attending: Hematology

## 2023-06-09 VITALS — BP 173/92 | HR 84 | Temp 98.7°F | Resp 20 | Wt 237.0 lb

## 2023-06-09 DIAGNOSIS — C778 Secondary and unspecified malignant neoplasm of lymph nodes of multiple regions: Secondary | ICD-10-CM | POA: Diagnosis not present

## 2023-06-09 DIAGNOSIS — Z7982 Long term (current) use of aspirin: Secondary | ICD-10-CM | POA: Diagnosis not present

## 2023-06-09 DIAGNOSIS — C61 Malignant neoplasm of prostate: Secondary | ICD-10-CM

## 2023-06-09 DIAGNOSIS — Z87891 Personal history of nicotine dependence: Secondary | ICD-10-CM | POA: Diagnosis not present

## 2023-06-09 DIAGNOSIS — Z79899 Other long term (current) drug therapy: Secondary | ICD-10-CM | POA: Diagnosis not present

## 2023-06-09 DIAGNOSIS — C771 Secondary and unspecified malignant neoplasm of intrathoracic lymph nodes: Secondary | ICD-10-CM

## 2023-06-09 DIAGNOSIS — C7951 Secondary malignant neoplasm of bone: Secondary | ICD-10-CM | POA: Insufficient documentation

## 2023-06-09 LAB — COMPREHENSIVE METABOLIC PANEL
ALT: 10 U/L (ref 0–44)
AST: 14 U/L — ABNORMAL LOW (ref 15–41)
Albumin: 3.6 g/dL (ref 3.5–5.0)
Alkaline Phosphatase: 36 U/L — ABNORMAL LOW (ref 38–126)
Anion gap: 10 (ref 5–15)
BUN: 17 mg/dL (ref 8–23)
CO2: 28 mmol/L (ref 22–32)
Calcium: 9.4 mg/dL (ref 8.9–10.3)
Chloride: 98 mmol/L (ref 98–111)
Creatinine, Ser: 1.06 mg/dL (ref 0.61–1.24)
GFR, Estimated: >60 mL/min (ref >60)
Glucose, Bld: 131 mg/dL — ABNORMAL HIGH (ref 70–99)
Potassium: 3.9 mmol/L (ref 3.5–5.1)
Sodium: 136 mmol/L (ref 135–145)
Total Bilirubin: 0.9 mg/dL (ref 0.0–1.2)
Total Protein: 6.4 g/dL — ABNORMAL LOW (ref 6.5–8.1)

## 2023-06-09 LAB — CBC WITH DIFFERENTIAL/PLATELET
Abs Immature Granulocytes: 0.05 10*3/uL (ref 0.00–0.07)
Basophils Absolute: 0 10*3/uL (ref 0.0–0.1)
Basophils Relative: 1 %
Eosinophils Absolute: 0.1 10*3/uL (ref 0.0–0.5)
Eosinophils Relative: 1 %
HCT: 35.3 % — ABNORMAL LOW (ref 39.0–52.0)
Hemoglobin: 11.5 g/dL — ABNORMAL LOW (ref 13.0–17.0)
Immature Granulocytes: 1 %
Lymphocytes Relative: 11 %
Lymphs Abs: 0.9 10*3/uL (ref 0.7–4.0)
MCH: 28.5 pg (ref 26.0–34.0)
MCHC: 32.6 g/dL (ref 30.0–36.0)
MCV: 87.6 fL (ref 80.0–100.0)
Monocytes Absolute: 0.7 10*3/uL (ref 0.1–1.0)
Monocytes Relative: 9 %
Neutro Abs: 6.4 10*3/uL (ref 1.7–7.7)
Neutrophils Relative %: 77 %
Platelets: 291 10*3/uL (ref 150–400)
RBC: 4.03 MIL/uL — ABNORMAL LOW (ref 4.22–5.81)
RDW: 14.5 % (ref 11.5–15.5)
WBC: 8.1 10*3/uL (ref 4.0–10.5)
nRBC: 0 % (ref 0.0–0.2)

## 2023-06-09 LAB — PSA: Prostatic Specific Antigen: 21.35 ng/mL — ABNORMAL HIGH (ref 0.00–4.00)

## 2023-06-09 MED ORDER — HEPARIN SOD (PORK) LOCK FLUSH 100 UNIT/ML IV SOLN
500.0000 [IU] | Freq: Once | INTRAVENOUS | Status: AC
  Administered 2023-06-09: 500 [IU] via INTRAVENOUS

## 2023-06-09 MED ORDER — SODIUM CHLORIDE 0.9% FLUSH
10.0000 mL | INTRAVENOUS | Status: DC | PRN
  Administered 2023-06-09: 10 mL via INTRAVENOUS

## 2023-06-09 MED ORDER — DENOSUMAB 120 MG/1.7ML ~~LOC~~ SOLN
120.0000 mg | Freq: Once | SUBCUTANEOUS | Status: AC
  Administered 2023-06-09: 120 mg via SUBCUTANEOUS
  Filled 2023-06-09: qty 1.7

## 2023-06-09 NOTE — Progress Notes (Signed)
 Patient presents today for follow up appointment with Dr. Cheree Cords, lab work and Xgeva  injection. Creatinine 1.06. Calcium 9.4. Patient taking 600 mg of Calcium daily. Cholecalciferol ( Vitamin D3) 50 mcgs (2000 UT) daily. Vital signs stable. Blood pressure elevated on arrival.

## 2023-06-09 NOTE — Patient Instructions (Signed)
 [facility] [place] [facility]  Discharge Instructions: Thank you for choosing [facility] to provide your oncology and hematology care.  If you have a lab appointment with the Cancer Center - please note that after April 8th, 2024, all labs will be drawn in the cancer center.  You do not have to check in or register with the main entrance as you have in the past but will complete your check-in in the cancer center.  Wear comfortable clothing and clothing appropriate for easy access to any Portacath or PICC line.   We strive to give you quality time with your provider. You may need to reschedule your appointment if you arrive late (15 or more minutes).  Arriving late affects you and other patients whose appointments are after yours.  Also, if you miss three or more appointments without notifying the office, you may be dismissed from the clinic at the provider's discretion.      For prescription refill requests, have your pharmacy contact our office and allow 72 hours for refills to be completed.    Today you received Xgeva injection    BELOW ARE SYMPTOMS THAT SHOULD BE REPORTED IMMEDIATELY: *FEVER GREATER THAN 100.4 F (38 C) OR HIGHER *CHILLS OR SWEATING *NAUSEA AND VOMITING THAT IS NOT CONTROLLED WITH YOUR NAUSEA MEDICATION *UNUSUAL SHORTNESS OF BREATH *UNUSUAL BRUISING OR BLEEDING *URINARY PROBLEMS (pain or burning when urinating, or frequent urination) *BOWEL PROBLEMS (unusual diarrhea, constipation, pain near the anus) TENDERNESS IN MOUTH AND THROAT WITH OR WITHOUT PRESENCE OF ULCERS (sore throat, sores in mouth, or a toothache) UNUSUAL RASH, SWELLING OR PAIN  UNUSUAL VAGINAL DISCHARGE OR ITCHING   Items with * indicate a potential emergency and should be followed up as soon as possible or go to the Emergency Department if any problems should occur.  Please show the CHEMOTHERAPY ALERT CARD or IMMUNOTHERAPY ALERT CARD at check-in to  the Emergency Department and triage nurse.  Should you have questions after your visit or need to cancel or reschedule your appointment, please contact [facility] [place] - A DEPT OF Eligh[facility] Bridegroom [facility] [phone]  and follow the prompts.  Office hours are 8:00 a.m. to 4:30 p.m. Monday - Friday. Please note that voicemails left after 4:00 p.m. may not be returned until the following business day.  We are closed weekends and major holidays. You have access to a nurse at all times for urgent questions. Please call the main number to the clinic [phone] and follow the prompts.  For any non-urgent questions, you may also contact your provider using MyChart. We now offer e-Visits for anyone 28 and older to request care online for non-urgent symptoms. For details visit [email or web address].   Also download the MyChart app! Go to the app store, search "MyChart", open the app, select [place], and log in with your MyChart username and password.

## 2023-06-09 NOTE — Patient Instructions (Signed)
 [facility] at [facility] Discharge Instructions   You were seen and examined today by Dr. Cheree Cords.  He reviewed the results of your lab work which are normal/stable. Your PSA from 6 weeks ago was up a little bit. The results of today's PSA are pending. This is not unusual for this number to fluctuate a little bit.   We will proceed with your Xgeva  injection today.   You may wean off of the prednisone  5 mg. Take one pill every other day for a week and then stop.   We will see you back in 6 weeks. We will repeat lab work at that time.   Return as scheduled.    Thank you for choosing [facility] at [facility] to provide your oncology and hematology care.  To afford each patient quality time with our provider, please arrive at least 15 minutes before your scheduled appointment time.   If you have a lab appointment with the Cancer Center please come in thru the Main Entrance and check in at the main information desk.  You need to re-schedule your appointment should you arrive 10 or more minutes late.  We strive to give you quality time with our providers, and arriving late affects you and other patients whose appointments are after yours.  Also, if you no show three or more times for appointments you may be dismissed from the clinic at the providers discretion.     Again, thank you for choosing [facility].  Our hope is that these requests will decrease the amount of time that you wait before being seen by our physicians.       _____________________________________________________________  Should you have questions after your visit to [facility], please contact our office at [phone] and follow the prompts.  Our office hours are 8:00 a.m. and 4:30 p.m. Monday - Friday.  Please note that voicemails left after 4:00 p.m. may not be returned until the following business day.  We are closed weekends and major holidays.   You do have access to a nurse 24-7, just call the main number to the clinic [phone] and do not press any options, hold on the line and a nurse will answer the phone.    For prescription refill requests, have your pharmacy contact our office and allow 72 hours.    Due to Covid, you will need to wear a mask upon entering the hospital. If you do not have a mask, a mask will be given to you at the Main Entrance upon arrival. For doctor visits, patients may have 1 support person age 36 or older with them. For treatment visits, patients can not have anyone with them due to social distancing guidelines and our immunocompromised population.

## 2023-06-09 NOTE — Progress Notes (Signed)
 [facility] [address] [place] [address]    Clinic Day:  06/09/23   Referring physician: Clinic, Andre [name]  Patient Care Team: Clinic, Andre [name] as PCP - General Andre Boros, MD as Medical Oncologist (Medical Oncology) Andre Payer, MD as Consulting Physician (Radiation Oncology)   ASSESSMENT & PLAN:   Assessment: 1.  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 -Abiraterone  and prednisone  from 07/13/2016 through 04/09/2019, discontinued secondary to PSA progression. -Enzalutamide  from 04/12/2019 through 11/03/2019 with progression. -Last Lupron  on 06/09/2019. -CT CAP on 11/10/2019 shows no findings of active malignancy.  Stable small sclerotic lesions at T4 and T10, nonspecific.  Right hydronephrosis with right proximal hydroureter extending down to a 0.8 x 0.8 x 0.4 cm proximal ureteral stone at L3 vertical level.  Nonobstructive right and possibly left nephrolithiasis. -Bone scan on 11/10/2019 shows right proximal tibial metaphyseal activity from recent trauma.  Degenerative findings in the spine, right sternoclavicular joint, shoulders and right foot.  No evidence of metastatic disease. -Foundation 1 test shows MS-stable.  AR amplification.  Sensitivity is reduced due to sample quality. -Guardant 360 on 12/12/2019 MSI high not detected.  TMB was not evaluable.  No other targetable mutations. -F-18-PYLARIFY  PET scan showed intense radiotracer activity in the left supraclavicular and prevascular lymph nodes, measuring 10 mm.  Cluster of small prevascular lymph nodes measures 5-7 mm each with SUV 54.  AP window lymph node measuring 8 mm.  No activity in the prostate gland or abdominal or pelvic lymph nodes.  No skeletal activity. - SBRT to the left supraclavicular lymph node and prevascular mediastinal  lymph node from 08/27/2020 through 09/06/2020, 40 Gray in 5 fractions. - CT CAP (10/22/2021): Done at [facility]: No lymphadenopathy.  Cirrhosis.  Intrahepatic and extrahepatic biliary ductal dilatation. - Bone scan (10/28/2021): New focal abnormal tracer uptake at the inferior aspect of the right scapula. - PSMA PET scan (11/13/2021): Interval development of multifocal tracer avid bone metastasis, only 1 of these lesions is visible on the recent nuclear medicine bone scan dated 10/28/2021.  New tracer avid right axillary, subcarinal, left paratracheal, left retrocrural and upper abdominal lymph nodes. - 13 cycles of cabazitaxel  from 12/15/2021 through 09/08/2022 with progression - Pluvicto  cycle 1 on 11/16/2022, cycle 2 on 01/12/2023, cycle 3 on 02/09/2023, cycle 4 on 03/23/2023   2.  Androgen deprivation therapy induced bone loss: -Received Zometa  every 3 months for a year completed on 10/29/2017.   3.  Genetic testing: -Germline mutation testing was negative.    Plan: 1.  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 - Continues to have dry mouth and uses hard candy which helps.  Feels tired for 3 to 4 days after each treatment which has not worsened.  No GI side effects. - Labs from today: Normal LFTs and renal function.  CBC grossly normal.  PSA is 13.31, increased from 10.8 from on 03/16/2023.  PSA from today is pending. - He may proceed with Pluvicto  cycle #6 on 06/15/2023.  Will see him back in 7 weeks with repeat PSMA PET scan and PSA level. - He had recent sinus infection and used prednisone  and Z-Pak.  Still continues to have lingering sinus drainage, clear mucus.  Continue using Mucinex  and OTC medication.   2.  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  Continue denosumab  injection.   3.  Leg swelling: - Continue Bumex  daily.  Mild swelling is stable.   4.  Peripheral neuropathy: - Numbness  in the feet is stable.  5.  Hypomagnesemia: - Continue magnesium  twice daily.    No orders of the defined types were placed in this encounter.     Andre Boros, MD   1/15/202511:13 AM  CHIEF COMPLAINT:   Diagnosis: metastatic prostate cancer to intrathoracic lymph node    Cancer Staging  No matching staging information was found for the patient.    Prior Therapy: 1. Docetaxel x 14 cycles from 08/11/2015 through 06/12/2016. 2. Abiraterone  from 07/13/2016 through 04/09/2019. 3. Enzalutamide  from 04/12/2019 to 11/03/2019. 4. SBRT to nodal mets, 08/27/20 - 09/06/20 5. ADT (Lupron /Eligard ) through 08/06/21 6. Cabazitaxel , 13 cycles, 12/15/21 - 09/08/22  Current Therapy:  Pluvicto     HISTORY OF PRESENT ILLNESS:   Oncology History  Prostate cancer metastatic to intrathoracic lymph node (HCC)  07/24/2015 Initial Diagnosis   Prostate cancer metastatic to the left supraclavicular and anterior mediastinal lymph nodes   08/21/2015 - 06/12/2016 Chemotherapy   Docetaxel every 3 weeks for 14 cycles, discontinued as PSA has plateaued around 11    07/13/2016 Treatment Plan Change   Zytiga  1000 milligrams daily along with prednisone  5 mg daily, started secondary to fluid overload from docetaxel   10/14/2018 Genetic Testing   Negative genetic testing.  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  The Common Hereditary Gene Panel offered by [facility] includes sequencing and/or deletion duplication testing of the following 48 genes: APC, ATM, AXIN2, BARD1, BMPR1A, BRCA1, BRCA2, BRIP1, CDH1, CDK4, CDKN2A (p14ARF), CDKN2A (p16INK4a), CHEK2, CTNNA1, DICER1, EPCAM (Deletion/duplication testing only), GREM1 (promoter region deletion/duplication testing only), KIT, MEN1, MLH1, MSH2, MSH3, MSH6, MUTYH, NBN, NF1, NHTL1, PALB2, PDGFRA, PMS2, POLD1, POLE, PTEN, RAD50, RAD51C, RAD51D, RNF43, SDHB, SDHC, SDHD, SMAD4, SMARCA4. STK11, TP53, TSC1, TSC2, and VHL.  The following genes were evaluated for sequence changes  only: SDHA and HOXB13 c.251G>A variant only. The report date is Oct 14, 2018.   09/18/2019 Genetic Testing   Foundation One CDx      12/12/2019 Genetic Testing   Guardant 360       12/15/2021 - 01/06/2022 Chemotherapy   Patient is on Treatment Plan : PROSTATE Cabazitaxel  + Prednisone  q21d     12/15/2021 -  Chemotherapy   Patient is on Treatment Plan : PROSTATE Cabazitaxel  (20) D1 + Prednisone  D1-21 q21d        INTERVAL HISTORY:   Andre [name] is a 83 y.o. male seen for follow-up of metastatic prostate cancer to the lymph nodes.  Reports appetite level 100% and energy level 75%.  PAST MEDICAL HISTORY:   Past Medical History: Past Medical History:  Diagnosis Date   COPD (chronic obstructive pulmonary disease) (HCC)    Diabetes mellitus without complication (HCC)    Family history of ovarian cancer    Family history of prostate cancer    Hypertension    Prostate cancer [facility])     Surgical History: Past Surgical History:  Procedure Laterality Date   GOLD SEED IMPLANT      Social History: Social History   Socioeconomic History   Marital status: Widowed    Spouse name: Not on file   Number of children: Not on file   Years of education: Not on file   Highest education level: Not on file  Occupational History   Not on file  Tobacco Use   Smoking status:  Former    Current packs/day: 0.00    Average packs/day: 0.3 packs/day for 60.0 years (15.0 ttl pk-yrs)    Types: Cigarettes    Start date: 06/25/1954    Quit date: 06/25/2014    Years since quitting: 8.9   Smokeless tobacco: Never   Tobacco comments:    smoked since young age, quit in 2016  Substance and Sexual Activity   Alcohol use: Not Currently   Drug use: No   Sexual activity: Not Currently  Other Topics Concern   Not on file  Social History Narrative   Not on file   Social Drivers of Health   Financial Resource Strain: Low Risk  (08/08/2020)   Received from [facility], [facility]   Overall  Financial Resource Strain (CARDIA)    Difficulty of Paying Living Expenses: Not hard at all  Food Insecurity: No Food Insecurity (08/08/2020)   Received from [facility], [facility]   Hunger Vital Sign    Worried About Running Out of Food in the Last Year: Never true    Ran Out of Food in the Last Year: Never true  Transportation Needs: No Transportation Needs (08/08/2020)   Received from [facility], [facility]   [facility] - Transportation    Lack of Transportation (Medical): No    Lack of Transportation (Non-Medical): No  Physical Activity: Insufficiently Active (05/21/2020)   Exercise Vital Sign    Days of Exercise per Week: 2 days    Minutes of Exercise per Session: 20 min  Stress: No Stress Concern Present (05/21/2020)   [employer] of Occupational Health - Occupational Stress Questionnaire    Feeling of Stress : Not at all  Social Connections: Moderately Isolated (05/21/2020)   Social Connection and Isolation Panel [NHANES]    Frequency of Communication with Friends and Family: More than three times a week    Frequency of Social Gatherings with Friends and Family: More than three times a week    Attends Religious Services: 1 to 4 times per year    Active Member of [employer] or Organizations: No    Attends Banker Meetings: Never    Marital Status: Widowed  Intimate Partner Violence: Not At Risk (05/21/2020)   Humiliation, Afraid, Rape, and Kick questionnaire    Fear of Current or Ex-Partner: No    Emotionally Abused: No    Physically Abused: No    Sexually Abused: No    Family History: Family History  Problem Relation Age of Onset   Ovarian cancer Mother 47   Heart attack Father    Prostate cancer Maternal Uncle 97   Cancer Cousin        pat cousin with unknown cancer   Colon cancer Neg Hx    Pancreatic cancer Neg Hx    Breast cancer Neg Hx     Current Medications:  Current Outpatient Medications:    ACCU-CHEK GUIDE test strip, ,  Disp: , Rfl:    Accu-Chek Softclix Lancets lancets, , Disp: , Rfl:    acetaminophen  (TYLENOL ) 500 MG tablet, Take 500 mg by mouth every 6 (six) hours as needed for moderate pain., Disp: , Rfl:    albuterol  (ACCUNEB ) 0.63 MG/3ML nebulizer solution, Take 1 ampule by nebulization every 6 (six) hours as needed for wheezing., Disp: , Rfl:    albuterol  (PROVENTIL ) (2.5 MG/3ML) 0.083% nebulizer solution, INHALE 3 ML IN NEBULIZER BY MOUTH FOUR TIMES A DAY AS NEEDED, Disp: , Rfl:  amLODipine  (NORVASC ) 2.5 MG tablet, Take 1 tablet (2.5 mg total) by mouth daily as needed., Disp: 30 tablet, Rfl: 0   aspirin 81 MG EC tablet, Take 1 tablet by mouth daily., Disp: , Rfl:    atorvastatin  (LIPITOR) 10 MG tablet, TAKE 1 TABLET BY MOUTH EVERY DAY (Patient taking differently: Take 5 mg by mouth daily.), Disp: 90 tablet, Rfl: 3   azelastine (ASTELIN) 0.1 % nasal spray, USE 2 SPRAYS IN EACH NOSTRIL TWICE A DAY FOR ALLERGIC RHINITIS, Disp: , Rfl:    Blood Glucose Monitoring Suppl (ACCU-CHEK GUIDE) w/Device KIT, , Disp: , Rfl:    brompheniramine-pseudoephedrine-DM 30-2-10 MG/5ML syrup, TAKE 5 ML BY MOUTH EVERY 4 HOURS AS NEEDED FOR COUGH, Disp: 120 mL, Rfl: 0   bumetanide  (BUMEX ) 1 MG tablet, TAKE 1 AND 1/2 TABLETS(1.5 MG) BY MOUTH DAILY, Disp: 45 tablet, Rfl: 6   CALCIUM PO, Take 600 mg by mouth daily. , Disp: , Rfl:    cetirizine  (ZYRTEC ) 10 MG tablet, Take 1 tablet by mouth daily., Disp: , Rfl:    CHERATUSSIN AC 100-10 MG/5ML syrup, Take 5 mLs by mouth daily as needed. , Disp: , Rfl: 0   Cholecalciferol (VITAMIN D3) 50 MCG (2000 UT) capsule, Take 2,000 Units by mouth daily., Disp: , Rfl:    dicyclomine  (BENTYL ) 20 MG tablet, Take 1 tablet by mouth 3 (three) times daily as needed., Disp: , Rfl:    dorzolamide  (TRUSOPT ) 2 % ophthalmic solution, Place 1 drop into both eyes 3 (three) times daily., Disp: , Rfl:    erythromycin ophthalmic ointment, 3 (three) times daily., Disp: , Rfl:    ferrous sulfate 325 (65 FE) MG  tablet, Take 1 tablet by mouth daily., Disp: , Rfl:    fexofenadine (ALLEGRA) 180 MG tablet, Take 1 tablet every day by oral route at bedtime for 90 days., Disp: , Rfl:    fluticasone  (FLONASE ) 50 MCG/ACT nasal spray, USE 2 SPRAY(S) IN EACH NOSTRIL ONCE DAILY FOR 30 DAYS, Disp: 15.8 mL, Rfl: 12   ibuprofen  (ADVIL ,MOTRIN ) 600 MG tablet, Take 600 mg by mouth every 6 (six) hours as needed. , Disp: , Rfl: 0   ipratropium (ATROVENT ) 0.03 % nasal spray, USE 2 SPRAYS IN EACH NOSTRIL TWICE DAILY AS NEEDED, Disp: , Rfl:    Ipratropium-Albuterol  (COMBIVENT) 20-100 MCG/ACT AERS respimat, INHALE 1 PUFF BY MOUTH FOUR TIMES A DAY AS NEEDED COPD, Disp: , Rfl:    latanoprost  (XALATAN ) 0.005 % ophthalmic solution, INSTILL 1 DROP INTO AFFECTED EYE(S) BY OPHTHALMIC ROUTE ONCE DAILY INTHE EVENING, Disp: , Rfl:    Latanoprostene Bunod  (VYZULTA ) 0.024 % SOLN, Apply to eye., Disp: , Rfl:    Leuprolide  Acetate, 4 Month, (ELIGARD ) 30 MG injection, 30 MG SUBCUTANEOUSLY Q90D PRN, Disp: , Rfl:    losartan  (COZAAR ) 100 MG tablet, Take 1 tablet by mouth daily., Disp: , Rfl:    magnesium  oxide (MAG-OX) 400 MG tablet, Take 1 tablet (400 mg total) by mouth daily. (Patient taking differently: Take 400 mg by mouth 2 (two) times daily.), Disp: 90 tablet, Rfl: 2   metFORMIN  (GLUCOPHAGE ) 500 MG tablet, Take 2 tablets (1,000 mg total) by mouth 2 (two) times daily with a meal., Disp: 120 tablet, Rfl: 3   montelukast (SINGULAIR) 10 MG tablet, Take 10 mg by mouth at bedtime., Disp: , Rfl:    Multiple Vitamins-Minerals (PRESERVISION AREDS PO), 1 tablet daily., Disp: , Rfl:    potassium chloride  (MICRO-K ) 10 MEQ CR capsule, TAKE 4 CAPSULE BY MOUTH DAILY WITH  FOOD, OPEN AND SPRINKLE ON FOOD, Disp: 348 capsule, Rfl: 2   predniSONE  (DELTASONE ) 5 MG tablet, TAKE 1 TABLET BY MOUTH DAILY WITH BREAKFAST, Disp: 30 tablet, Rfl: 3   prochlorperazine  (COMPAZINE ) 10 MG tablet, Take 1 tablet (10 mg total) by mouth every 6 (six) hours as needed for nausea  or vomiting., Disp: 60 tablet, Rfl: 3   sucralfate  (CARAFATE ) 1 g tablet, Take 1 tablet by mouth 4 (four) times daily as needed., Disp: , Rfl:    TRELEGY ELLIPTA  100-62.5-25 MCG/INH AEPB, INHALE 1 PUFF ONCE DAILY, Disp: , Rfl:    vitamin C (ASCORBIC ACID) 500 MG tablet, Take 500 mg by mouth daily., Disp: , Rfl:  No current facility-administered medications for this visit.  Facility-Administered Medications Ordered in Other Visits:    denosumab  (XGEVA ) 120 MG/1.7ML injection, , , ,    diphenhydrAMINE  (BENADRYL ) 50 MG/ML injection, , , ,    famotidine  (PEPCID ) 20-0.9 MG/50ML-% IVPB, , , ,    Allergies: Allergies  Allergen Reactions   Lisinopril Cough   Metoprolol Other (See Comments) and Cough    Increases frequency of cough Other reaction(s): Other (See Comments) Increases frequency of cough   Semaglutide Nausea And Vomiting    REVIEW OF SYSTEMS:   Review of Systems  Constitutional:  Negative for chills, fatigue and fever.  HENT:   Negative for lump/mass, mouth sores, nosebleeds, sore throat and trouble swallowing.        +dry mouth  Eyes:  Negative for eye problems.  Respiratory:  Positive for shortness of breath.   Cardiovascular:  Negative for chest pain, leg swelling and palpitations.  Gastrointestinal:  Negative for abdominal pain, nausea and vomiting.  Genitourinary:  Negative for bladder incontinence, difficulty urinating, dysuria, frequency, hematuria and nocturia.   Musculoskeletal:  Negative for arthralgias, back pain, flank pain, myalgias and neck pain.  Skin:  Negative for itching and rash.  Neurological:  Positive for numbness (in fingers and feet). Negative for dizziness and headaches.  Hematological:  Does not bruise/bleed easily.  Psychiatric/Behavioral:  Negative for depression, sleep disturbance and suicidal ideas. The patient is not nervous/anxious.   All other systems reviewed and are negative.    VITALS:   Blood pressure (!) 173/92, pulse 84, temperature  98.7 F (37.1 C), temperature source Tympanic, resp. rate 20, weight 237 lb (107.5 kg), SpO2 98%.  Wt Readings from Last 3 Encounters:  06/09/23 237 lb (107.5 kg)  04/27/23 230 lb 12.8 oz (104.7 kg)  03/16/23 230 lb (104.3 kg)    Body mass index is 36.04 kg/m.  Performance status (ECOG): 1 - Symptomatic but completely ambulatory  PHYSICAL EXAM:   Physical Exam Vitals and nursing note reviewed. Exam conducted with a chaperone present.  Constitutional:      Appearance: Normal appearance.  Cardiovascular:     Rate and Rhythm: Normal rate and regular rhythm.     Pulses: Normal pulses.     Heart sounds: Normal heart sounds.  Pulmonary:     Effort: Pulmonary effort is normal.     Breath sounds: Normal breath sounds.  Abdominal:     Palpations: Abdomen is soft. There is no hepatomegaly, splenomegaly or mass.     Tenderness: There is no abdominal tenderness.  Musculoskeletal:     Right lower leg: No edema.     Left lower leg: No edema.  Lymphadenopathy:     Cervical: No cervical adenopathy.     Right cervical: No superficial, deep or posterior cervical adenopathy.  Left cervical: No superficial, deep or posterior cervical adenopathy.     Upper Body:     Right upper body: No supraclavicular or axillary adenopathy.     Left upper body: No supraclavicular or axillary adenopathy.  Neurological:     General: No focal deficit present.     Mental Status: He is alert and oriented to person, place, and time.  Psychiatric:        Mood and Affect: Mood normal.        Behavior: Behavior normal.    LABS:      Latest Ref Rng & Units 06/09/2023   10:21 AM 04/27/2023    9:58 AM 03/16/2023    2:21 PM  CBC  WBC 4.0 - 10.5 K/uL 8.1  8.5  8.6   Hemoglobin 13.0 - 17.0 g/dL 16.1  09.6  04.5   Hematocrit 39.0 - 52.0 % 35.3  38.0  35.5   Platelets 150 - 400 K/uL 291  295  302       Latest Ref Rng & Units 06/09/2023   10:21 AM 04/27/2023    9:58 AM 03/16/2023    2:21 PM  CMP  Glucose 70  - 99 mg/dL 409  811  914   BUN 8 - 23 mg/dL 17  25  16    Creatinine 0.61 - 1.24 mg/dL 7.82  9.56  2.13   Sodium 135 - 145 mmol/L 136  140  138   Potassium 3.5 - 5.1 mmol/L 3.9  4.0  4.3   Chloride 98 - 111 mmol/L 98  101  102   CO2 22 - 32 mmol/L 28  28  26    Calcium 8.9 - 10.3 mg/dL 9.4  9.5  8.9   Total Protein 6.5 - 8.1 g/dL 6.4  6.7  6.1   Total Bilirubin 0.0 - 1.2 mg/dL 0.9  0.4  0.5   Alkaline Phos 38 - 126 U/L 36  35  36   AST 15 - 41 U/L 14  14  15    ALT 0 - 44 U/L 10  12  11       No results found for: "CEA1", "CEA" / No results found for: "CEA1", "CEA" No results found for: "PSA1" No results found for: "YQM578" No results found for: "CAN125"  No results found for: "TOTALPROTELP", "ALBUMINELP", "A1GS", "A2GS", "BETS", "BETA2SER", "GAMS", "MSPIKE", "SPEI" Lab Results  Component Value Date   TIBC 323 05/20/2022   TIBC 325 12/15/2021   TIBC 268 10/15/2021   FERRITIN 133 05/20/2022   FERRITIN 117 12/15/2021   FERRITIN 330 10/15/2021   IRONPCTSAT 12 (L) 05/20/2022   IRONPCTSAT 10 (L) 12/15/2021   IRONPCTSAT 23 10/15/2021   No results found for: "LDH"   STUDIES:   No results found.

## 2023-06-09 NOTE — Progress Notes (Signed)
 Xgeva  120 mg given today per MD orders. Tolerated infusion without adverse affects. Vital signs stable. No complaints at this time. Discharged from clinic via wheelchair in stable condition. Alert and oriented x 3. F/U with [facility] as scheduled.

## 2023-06-15 ENCOUNTER — Encounter (HOSPITAL_COMMUNITY)
Admission: RE | Admit: 2023-06-15 | Discharge: 2023-06-15 | Disposition: A | Discharge status: Home / Self Care | Payer: Medicare PPO | Source: Ambulatory Visit | Attending: Hematology | Admitting: Hematology

## 2023-06-15 DIAGNOSIS — C61 Malignant neoplasm of prostate: Secondary | ICD-10-CM

## 2023-06-15 DIAGNOSIS — C7951 Secondary malignant neoplasm of bone: Secondary | ICD-10-CM

## 2023-06-15 DIAGNOSIS — C7952 Secondary malignant neoplasm of bone marrow: Secondary | ICD-10-CM | POA: Insufficient documentation

## 2023-06-15 MED ORDER — SODIUM CHLORIDE 0.9 % IV BOLUS: 1000.0000 mL | Freq: Once | INTRAVENOUS | Status: DC

## 2023-06-15 MED ORDER — LUTETIUM LU 177 VIPIVOTIDE TET 1000 MBQ/ML IV SOLN
214.0000 | Freq: Once | INTRAVENOUS | Status: AC
  Administered 2023-06-15: 214 via INTRAVENOUS

## 2023-06-15 NOTE — Written Directive (Addendum)
  PLUVICTO  THERAPY   RADIOPHARMACEUTICAL: Lutetium 177 vipivotide tetraxetan (Pluvicto)     PRESCRIBED DOSE FOR ADMINISTRATION:  200 mCi   ROUTE OFADMINISTRATION:  IV   DIAGNOSIS:  Prostate Cancer   REFERRING PHYSICIAN: [name]   TREATMENT #: 6   ADDITIONAL PHYSICIAN COMMENTS/NOTES:   AUTHORIZED USER SIGNATURE & TIME STAMP: Patriciaann Clan, MD   06/15/23    8:56 AM

## 2023-06-15 NOTE — Progress Notes (Signed)
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  Ongoing Lu  177 PSMA therapy.]  EXAM: NUCLEAR MEDICINE PLUVICTO INJECTION  TECHNIQUE: Infusion: The nuclear medicine technologist and I personally verified the dose activity to be delivered as specified in the written directive, and verified the patient identification via 2 separate methods.  Initial flush of the intravenous catheter was performed was sterile saline. The dose syringe was connected to the catheter and the Lu-177 Pluvicto administered over a 1 to 10 min infusion. Single 10 cc  lushes with normal saline follow the dose. No complications were noted. The entire IV tubing, venocatheter, stopcock and syringes was removed in total, placed in a disposal bag and sent for assay of the residual activity, which will be reported at a later time in our EMR by the physics staff. Pressure was applied to the venipuncture site, and a compression bandage placed. Patient monitored for 1 hour following infusion.    RADIOPHARMACEUTICALS: [214] microcuries Lu-177 PLUVICTO  FINDINGS: Current Infusion: [6]  Planned Infusions: 6    Patient presented to nuclear medicine for treatment.       Patient presents minimal fatigue and dry mouth.  No significant adverse effects.     No myelosuppression, renal toxicity or hepatic toxicity related to therapy identified.    PSMA trending upwards at 21.35 increased from 13.31.     The patient's most recent blood counts were reviewed and remains a good candidate to proceed with Lu-177 Pluvicto. The patient was situated in an infusion suite with a contact barrier placed under the arm. Intravenous access was established, using sterile technique, and a normal saline infusion from a syringe was started.     Micro-dosimetry: The prescribed radiation activity was assayed and confirmed to be within specified tolerance.  IMPRESSION: Current Infusion: [6]  Planned  Infusions: 6    [The patient tolerated the infusion well.  Final Lu- 177 PSMA therapy.  PSMA trending upwards.  Recommend PSMA PET scan in 4 to 6 weeks.

## 2023-06-21 ENCOUNTER — Inpatient Hospital Stay: Payer: Medicare PPO

## 2023-06-28 ENCOUNTER — Other Ambulatory Visit (HOSPITAL_COMMUNITY): Payer: No Typology Code available for payment source

## 2023-07-01 ENCOUNTER — Other Ambulatory Visit: Payer: Self-pay | Admitting: Hematology

## 2023-07-19 ENCOUNTER — Encounter (HOSPITAL_COMMUNITY)
Admission: RE | Admit: 2023-07-19 | Discharge: 2023-07-19 | Disposition: A | Discharge status: Home / Self Care | Payer: Medicare PPO | Source: Ambulatory Visit | Attending: Hematology | Admitting: Hematology

## 2023-07-19 DIAGNOSIS — C771 Secondary and unspecified malignant neoplasm of intrathoracic lymph nodes: Secondary | ICD-10-CM | POA: Insufficient documentation

## 2023-07-19 DIAGNOSIS — C61 Malignant neoplasm of prostate: Secondary | ICD-10-CM | POA: Diagnosis present

## 2023-07-19 MED ORDER — FLOTUFOLASTAT F 18 GALLIUM 296-5846 MBQ/ML IV SOLN
7.7000 | Freq: Once | INTRAVENOUS | Status: AC
  Administered 2023-07-19: 7.7 via INTRAVENOUS
  Filled 2023-07-19: qty 8

## 2023-07-22 ENCOUNTER — Other Ambulatory Visit: Payer: Self-pay

## 2023-07-27 NOTE — Progress Notes (Signed)
 [facility] [address] [place] [address]    Clinic Day:  07/28/2023  Referring physician: Clinic, [name]  Patient Care Team: Clinic, [name] as PCP - General [name], MD as Medical Oncologist (Medical Oncology) Margaretmary Dys, MD as Consulting Physician (Radiation Oncology)   ASSESSMENT & PLAN:   Assessment: 1.  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 -Abiraterone and prednisone from 07/13/2016 through 04/09/2019, discontinued secondary to PSA progression. -Enzalutamide from 04/12/2019 through 11/03/2019 with progression. -Last Lupron on 06/09/2019. -CT CAP on 11/10/2019 shows no findings of active malignancy.  Stable small sclerotic lesions at T4 and T10, nonspecific.  Right hydronephrosis with right proximal hydroureter extending down to a 0.8 x 0.8 x 0.4 cm proximal ureteral stone at L3 vertical level.  Nonobstructive right and possibly left nephrolithiasis. -Bone scan on 11/10/2019 shows right proximal tibial metaphyseal activity from recent trauma.  Degenerative findings in the spine, right sternoclavicular joint, shoulders and right foot.  No evidence of metastatic disease. -Foundation 1 test shows MS-stable.  AR amplification.  Sensitivity is reduced due to sample quality. -Guardant 360 on 12/12/2019 MSI high not detected.  TMB was not evaluable.  No other targetable mutations. -F-18-PYLARIFY PET scan showed intense radiotracer activity in the left supraclavicular and prevascular lymph nodes, measuring 10 mm.  Cluster of small prevascular lymph nodes measures 5-7 mm each with SUV 54.  AP window lymph node measuring 8 mm.  No activity in the prostate gland or abdominal or pelvic lymph nodes.  No skeletal activity. - SBRT to the left supraclavicular lymph node and prevascular mediastinal  lymph node from 08/27/2020 through 09/06/2020, 40 Gray in 5 fractions. - CT CAP (10/22/2021): Done at [facility]: No lymphadenopathy.  Cirrhosis.  Intrahepatic and extrahepatic biliary ductal dilatation. - Bone scan (10/28/2021): New focal abnormal tracer uptake at the inferior aspect of the right scapula. - PSMA PET scan (11/13/2021): Interval development of multifocal tracer avid bone metastasis, only 1 of these lesions is visible on the recent nuclear medicine bone scan dated 10/28/2021.  New tracer avid right axillary, subcarinal, left paratracheal, left retrocrural and upper abdominal lymph nodes. - 13 cycles of cabazitaxel from 12/15/2021 through 09/08/2022 with progression - Pluvicto cycle 1 on 11/16/2022, cycle 2 on 01/12/2023, cycle 3 on 02/09/2023, cycle 4 on 03/23/2023, cycle 5 on 05/04/2023 and cycle 6 on 06/15/2023.   2.  Androgen deprivation therapy induced bone loss: -Received Zometa every 3 months for a year completed on 10/29/2017.   3.  Genetic testing: -Germline mutation testing was negative.    Plan: 1.  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 - He continues to have dry mouth and uses hard candy which helps. - His PSA has increased to 21 from 13 on 06/09/2023.  Labs today shows normal LFTs, creatinine and calcium.  CBC grossly normal.  PSA pending. - We reviewed PSMA PET scan from 07/19/2023: Interval improvement in skeletal metastasis with decrease in hypermetabolic activity.  No evidence of nodal metastasis or visceral metastasis. - As his PSA is trending up, will follow closely.  Will repeat PSA and follow-up in 6 weeks.   2.  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  Continue denosumab today and every 6 weeks.   3.  Leg swelling: -  Continue Bumex daily.  Mild swelling is stable.   4.  Peripheral neuropathy: - Numbness in the feet is stable.  5.   Hypomagnesemia: - Continue magnesium twice daily.  Magnesium is normal today.    No orders of the defined types were placed in this encounter.     I,Katie Daubenspeck,acting as a Neurosurgeon for [name], MD.,have documented all relevant documentation on the behalf of [name], MD,as directed by  [name], MD while in the presence of [name], MD.   I, [name] MD, have reviewed the above documentation for accuracy and completeness, and I agree with the above.   [name], MD   3/5/202512:52 PM  CHIEF COMPLAINT:   Diagnosis: metastatic prostate cancer to intrathoracic lymph node    Cancer Staging  No matching staging information was found for the patient.    Prior Therapy: 1. Docetaxel x 14 cycles from 08/11/2015 through 06/12/2016. 2. Abiraterone from 07/13/2016 through 04/09/2019. 3. Enzalutamide from 04/12/2019 to 11/03/2019. 4. SBRT to nodal mets, 08/27/20 - 09/06/20 5. ADT (Lupron/Eligard) through 08/06/21 6. Cabazitaxel, 13 cycles, 12/15/21 - 09/08/22  Current Therapy:  Pluvicto    HISTORY OF PRESENT ILLNESS:   Oncology History  Prostate cancer metastatic to intrathoracic lymph node (HCC)  07/24/2015 Initial Diagnosis   Prostate cancer metastatic to the left supraclavicular and anterior mediastinal lymph nodes   08/21/2015 - 06/12/2016 Chemotherapy   Docetaxel every 3 weeks for 14 cycles, discontinued as PSA has plateaued around 11    07/13/2016 Treatment Plan Change   Zytiga 1000 milligrams daily along with prednisone 5 mg daily, started secondary to fluid overload from docetaxel   10/14/2018 Genetic Testing   Negative genetic testing.  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  The Common Hereditary Gene Panel offered by [facility] includes sequencing and/or deletion duplication testing of the following 48 genes: APC, ATM, AXIN2, BARD1, BMPR1A, BRCA1, BRCA2, BRIP1, CDH1, CDK4, CDKN2A (p14ARF), CDKN2A (p16INK4a), CHEK2,  CTNNA1, DICER1, EPCAM (Deletion/duplication testing only), GREM1 (promoter region deletion/duplication testing only), KIT, MEN1, MLH1, MSH2, MSH3, MSH6, MUTYH, NBN, NF1, NHTL1, PALB2, PDGFRA, PMS2, POLD1, POLE, PTEN, RAD50, RAD51C, RAD51D, RNF43, SDHB, SDHC, SDHD, SMAD4, SMARCA4. STK11, TP53, TSC1, TSC2, and VHL.  The following genes were evaluated for sequence changes only: SDHA and HOXB13 c.251G>A variant only. The report date is Oct 14, 2018.   09/18/2019 Genetic Testing   Foundation One CDx      12/12/2019 Genetic Testing   Guardant 360       12/15/2021 - 01/06/2022 Chemotherapy   Patient is on Treatment Plan : PROSTATE Cabazitaxel + Prednisone q21d     12/15/2021 -  Chemotherapy   Patient is on Treatment Plan : PROSTATE Cabazitaxel (20) D1 + Prednisone D1-21 q21d        INTERVAL HISTORY:   Andre [name] is a 83 y.o. male presenting to clinic today for follow up of metastatic prostate cancer to intrathoracic lymph node. He was last seen by me on 06/09/23.  Since his last visit, he underwent restaging PSMA PET scan on 07/19/23 showing: interval improvement in skeletal metastasis; no evidence of local recurrence, nodal metastasis, or visceral metastasis.  Today, he states that he is doing well overall. His appetite level is at 100%. His energy level is at 70%.  PAST MEDICAL HISTORY:   Past Medical History: Past Medical History:  Diagnosis Date   COPD (chronic obstructive pulmonary disease) (HCC)    Diabetes mellitus without complication (HCC)    Family history of ovarian  cancer    Family history of prostate cancer    Hypertension    Prostate cancer [facility])     Surgical History: Past Surgical History:  Procedure Laterality Date   GOLD SEED IMPLANT      Social History: Social History   Socioeconomic History   Marital status: Widowed    Spouse name: Not on file   Number of children: Not on file   Years of education: Not on file   Highest education level: Not on file   Occupational History   Not on file  Tobacco Use   Smoking status: Former    Current packs/day: 0.00    Average packs/day: 0.3 packs/day for 60.0 years (15.0 ttl pk-yrs)    Types: Cigarettes    Start date: 06/25/1954    Quit date: 06/25/2014    Years since quitting: 9.0   Smokeless tobacco: Never   Tobacco comments:    smoked since young age, quit in 2016  Substance and Sexual Activity   Alcohol use: Not Currently   Drug use: No   Sexual activity: Not Currently  Other Topics Concern   Not on file  Social History Narrative   Not on file   Social Drivers of Health   Financial Resource Strain: Low Risk  (08/08/2020)   Received from [facility], [facility]   Overall Financial Resource Strain (CARDIA)    Difficulty of Paying Living Expenses: Not hard at all  Food Insecurity: No Food Insecurity (08/08/2020)   Received from [facility], [facility]   Hunger Vital Sign    Worried About Running Out of Food in the Last Year: Never true    Ran Out of Food in the Last Year: Never true  Transportation Needs: No Transportation Needs (08/08/2020)   Received from [facility], [facility]   [facility] - Transportation    Lack of Transportation (Medical): No    Lack of Transportation (Non-Medical): No  Physical Activity: Insufficiently Active (05/21/2020)   Exercise Vital Sign    Days of Exercise per Week: 2 days    Minutes of Exercise per Session: 20 min  Stress: No Stress Concern Present (05/21/2020)   [employer] of Occupational Health - Occupational Stress Questionnaire    Feeling of Stress : Not at all  Social Connections: Moderately Isolated (05/21/2020)   Social Connection and Isolation Panel [NHANES]    Frequency of Communication with Friends and Family: More than three times a week    Frequency of Social Gatherings with Friends and Family: More than three times a week    Attends Religious Services: 1 to 4 times per year    Active Member of [employer] or  Organizations: No    Attends Banker Meetings: Never    Marital Status: Widowed  Intimate Partner Violence: Not At Risk (05/21/2020)   Humiliation, Afraid, Rape, and Kick questionnaire    Fear of Current or Ex-Partner: No    Emotionally Abused: No    Physically Abused: No    Sexually Abused: No    Family History: Family History  Problem Relation Age of Onset   Ovarian cancer Mother 13   Heart attack Father    Prostate cancer Maternal Uncle 54   Cancer Cousin        pat cousin with unknown cancer   Colon cancer Neg Hx    Pancreatic cancer Neg Hx    Breast cancer Neg Hx     Current Medications:  Current Outpatient Medications:    ACCU-CHEK GUIDE test strip, , Disp: , Rfl:    Accu-Chek Softclix Lancets lancets, , Disp: , Rfl:    acetaminophen (TYLENOL) 500 MG tablet, Take 500 mg by mouth every 6 (six) hours as needed for moderate pain., Disp: , Rfl:    albuterol (ACCUNEB) 0.63 MG/3ML nebulizer solution, Take 1 ampule by nebulization every 6 (six) hours as needed for wheezing., Disp: , Rfl:    albuterol (PROVENTIL) (2.5 MG/3ML) 0.083% nebulizer solution, INHALE 3 ML IN NEBULIZER BY MOUTH FOUR TIMES A DAY AS NEEDED, Disp: , Rfl:    amLODipine (NORVASC) 2.5 MG tablet, Take 1 tablet (2.5 mg total) by mouth daily as needed., Disp: 30 tablet, Rfl: 0   aspirin 81 MG EC tablet, Take 1 tablet by mouth daily., Disp: , Rfl:    atorvastatin (LIPITOR) 10 MG tablet, TAKE 1 TABLET BY MOUTH EVERY DAY (Patient taking differently: Take 5 mg by mouth daily.), Disp: 90 tablet, Rfl: 3   azelastine (ASTELIN) 0.1 % nasal spray, USE 2 SPRAYS IN EACH NOSTRIL TWICE A DAY FOR ALLERGIC RHINITIS, Disp: , Rfl:    Blood Glucose Monitoring Suppl (ACCU-CHEK GUIDE) w/Device KIT, , Disp: , Rfl:    brompheniramine-pseudoephedrine-DM 30-2-10 MG/5ML syrup, TAKE 5 ML BY MOUTH EVERY 4 HOURS AS NEEDED FOR COUGH, Disp: 120 mL, Rfl: 0   bumetanide (BUMEX) 1 MG tablet, TAKE 1 AND 1/2 TABLETS(1.5 MG) BY MOUTH  DAILY, Disp: 45 tablet, Rfl: 6   CALCIUM PO, Take 600 mg by mouth daily. , Disp: , Rfl:    cetirizine (ZYRTEC) 10 MG tablet, Take 1 tablet by mouth daily., Disp: , Rfl:    CHERATUSSIN AC 100-10 MG/5ML syrup, Take 5 mLs by mouth daily as needed. , Disp: , Rfl: 0   Cholecalciferol (VITAMIN D3) 50 MCG (2000 UT) capsule, Take 2,000 Units by mouth daily., Disp: , Rfl:    dicyclomine (BENTYL) 20 MG tablet, Take 1 tablet by mouth 3 (three) times daily as needed., Disp: , Rfl:    dorzolamide (TRUSOPT) 2 % ophthalmic solution, Place 1 drop into both eyes 3 (three) times daily., Disp: , Rfl:    erythromycin ophthalmic ointment, 3 (three) times daily., Disp: , Rfl:    ferrous sulfate 325 (65 FE) MG tablet, Take 1 tablet by mouth daily., Disp: , Rfl:    fexofenadine (ALLEGRA) 180 MG tablet, Take 1 tablet every day by oral route at bedtime for 90 days., Disp: , Rfl:    fluticasone (FLONASE) 50 MCG/ACT nasal spray, USE 2 SPRAY(S) IN EACH NOSTRIL ONCE DAILY FOR 30 DAYS, Disp: 15.8 mL, Rfl: 12   ibuprofen (ADVIL,MOTRIN) 600 MG tablet, Take 600 mg by mouth every 6 (six) hours as needed. , Disp: , Rfl: 0   ipratropium (ATROVENT) 0.03 % nasal spray, USE 2 SPRAYS IN EACH NOSTRIL TWICE DAILY AS NEEDED, Disp: , Rfl:    Ipratropium-Albuterol (COMBIVENT) 20-100 MCG/ACT AERS respimat, INHALE 1 PUFF BY MOUTH FOUR TIMES A DAY AS NEEDED COPD, Disp: , Rfl:    latanoprost (XALATAN) 0.005 % ophthalmic solution, INSTILL 1 DROP INTO AFFECTED EYE(S) BY OPHTHALMIC ROUTE ONCE DAILY INTHE EVENING, Disp: , Rfl:    Latanoprostene Bunod (VYZULTA) 0.024 % SOLN, Apply to eye., Disp: , Rfl:    Leuprolide Acetate, 4 Month, (ELIGARD) 30 MG injection, 30 MG SUBCUTANEOUSLY Q90D PRN, Disp: , Rfl:    losartan (COZAAR) 100 MG tablet, Take 1 tablet by mouth daily., Disp: , Rfl:    magnesium oxide (MAG-OX)  400 (240 Mg) MG tablet, TAKE 1 TABLET BY MOUTH TWICE DAILY, Disp: 180 tablet, Rfl: 3   magnesium oxide (MAG-OX) 400 MG tablet, Take 1 tablet  (400 mg total) by mouth daily. (Patient taking differently: Take 400 mg by mouth 2 (two) times daily.), Disp: 90 tablet, Rfl: 2   metFORMIN (GLUCOPHAGE) 500 MG tablet, Take 2 tablets (1,000 mg total) by mouth 2 (two) times daily with a meal., Disp: 120 tablet, Rfl: 3   montelukast (SINGULAIR) 10 MG tablet, Take 10 mg by mouth at bedtime., Disp: , Rfl:    Multiple Vitamins-Minerals (PRESERVISION AREDS PO), 1 tablet daily., Disp: , Rfl:    potassium chloride (MICRO-K) 10 MEQ CR capsule, TAKE 4 CAPSULE BY MOUTH DAILY WITH FOOD, OPEN AND SPRINKLE ON FOOD, Disp: 348 capsule, Rfl: 2   predniSONE (DELTASONE) 5 MG tablet, TAKE 1 TABLET BY MOUTH DAILY WITH BREAKFAST, Disp: 30 tablet, Rfl: 3   prochlorperazine (COMPAZINE) 10 MG tablet, Take 1 tablet (10 mg total) by mouth every 6 (six) hours as needed for nausea or vomiting., Disp: 60 tablet, Rfl: 3   sucralfate (CARAFATE) 1 g tablet, Take 1 tablet by mouth 4 (four) times daily as needed., Disp: , Rfl:    TRELEGY ELLIPTA 100-62.5-25 MCG/INH AEPB, INHALE 1 PUFF ONCE DAILY, Disp: , Rfl:    vitamin C (ASCORBIC ACID) 500 MG tablet, Take 500 mg by mouth daily., Disp: , Rfl:  No current facility-administered medications for this visit.  Facility-Administered Medications Ordered in Other Visits:    denosumab (XGEVA) 120 MG/1.7ML injection, , , ,    diphenhydrAMINE (BENADRYL) 50 MG/ML injection, , , ,    famotidine (PEPCID) 20-0.9 MG/50ML-% IVPB, , , ,    Allergies: Allergies  Allergen Reactions   Lisinopril Cough   Metoprolol Other (See Comments) and Cough    Increases frequency of cough Other reaction(s): Other (See Comments) Increases frequency of cough   Semaglutide Nausea And Vomiting    REVIEW OF SYSTEMS:   Review of Systems  Constitutional:  Negative for chills, fatigue and fever.  HENT:   Negative for lump/mass, mouth sores, nosebleeds, sore throat and trouble swallowing.   Eyes:  Negative for eye problems.  Respiratory:  Positive for  shortness of breath. Negative for cough.   Cardiovascular:  Negative for chest pain, leg swelling and palpitations.  Gastrointestinal:  Negative for abdominal pain, constipation, diarrhea, nausea and vomiting.  Genitourinary:  Negative for bladder incontinence, difficulty urinating, dysuria, frequency, hematuria and nocturia.   Musculoskeletal:  Negative for arthralgias, back pain, flank pain, myalgias and neck pain.  Skin:  Negative for itching and rash.  Neurological:  Positive for numbness. Negative for dizziness and headaches.  Hematological:  Does not bruise/bleed easily.  Psychiatric/Behavioral:  Positive for sleep disturbance. Negative for depression and suicidal ideas. The patient is not nervous/anxious.   All other systems reviewed and are negative.    VITALS:   There were no vitals taken for this visit.  Wt Readings from Last 3 Encounters:  07/28/23 240 lb 1.6 oz (108.9 kg)  06/09/23 237 lb (107.5 kg)  04/27/23 230 lb 12.8 oz (104.7 kg)    There is no height or weight on file to calculate BMI.  Performance status (ECOG): 1 - Symptomatic but completely ambulatory  PHYSICAL EXAM:   Physical Exam Vitals and nursing note reviewed. Exam conducted with a chaperone present.  Constitutional:      Appearance: Normal appearance.  Cardiovascular:     Rate and Rhythm: Normal  rate and regular rhythm.     Pulses: Normal pulses.     Heart sounds: Normal heart sounds.  Pulmonary:     Effort: Pulmonary effort is normal.     Breath sounds: Normal breath sounds.  Abdominal:     Palpations: Abdomen is soft. There is no hepatomegaly, splenomegaly or mass.     Tenderness: There is no abdominal tenderness.  Musculoskeletal:     Right lower leg: Edema present.     Left lower leg: Edema present.  Lymphadenopathy:     Cervical: No cervical adenopathy.     Right cervical: No superficial, deep or posterior cervical adenopathy.    Left cervical: No superficial, deep or posterior cervical  adenopathy.     Upper Body:     Right upper body: No supraclavicular or axillary adenopathy.     Left upper body: No supraclavicular or axillary adenopathy.  Neurological:     General: No focal deficit present.     Mental Status: He is alert and oriented to person, place, and time.  Psychiatric:        Mood and Affect: Mood normal.        Behavior: Behavior normal.     LABS:   CBC     Component Value Date/Time   WBC 7.2 07/28/2023 1029   RBC 3.95 (L) 07/28/2023 1029   HGB 11.1 (L) 07/28/2023 1029   HCT 35.1 (L) 07/28/2023 1029   PLT 294 07/28/2023 1029   MCV 88.9 07/28/2023 1029   MCH 28.1 07/28/2023 1029   MCHC 31.6 07/28/2023 1029   RDW 13.8 07/28/2023 1029   LYMPHSABS 0.6 (L) 07/28/2023 1029   MONOABS 0.9 07/28/2023 1029   EOSABS 0.1 07/28/2023 1029   BASOSABS 0.1 07/28/2023 1029    CMP      Component Value Date/Time   NA 138 07/28/2023 1029   K 3.9 07/28/2023 1029   CL 101 07/28/2023 1029   CO2 27 07/28/2023 1029   GLUCOSE 137 (H) 07/28/2023 1029   BUN 15 07/28/2023 1029   CREATININE 0.98 07/28/2023 1029   CALCIUM 9.5 07/28/2023 1029   PROT 6.5 07/28/2023 1029   ALBUMIN 3.5 07/28/2023 1029   AST 16 07/28/2023 1029   ALT 9 07/28/2023 1029   ALKPHOS 35 (L) 07/28/2023 1029   BILITOT 0.7 07/28/2023 1029   GFRNONAA >60 07/28/2023 1029   GFRAA >60 02/06/2020 1349     No results found for: "CEA1", "CEA" / No results found for: "CEA1", "CEA" No results found for: "PSA1" No results found for: "ZOX096" No results found for: "CAN125"  No results found for: "TOTALPROTELP", "ALBUMINELP", "A1GS", "A2GS", "BETS", "BETA2SER", "GAMS", "MSPIKE", "SPEI" Lab Results  Component Value Date   TIBC 323 05/20/2022   TIBC 325 12/15/2021   TIBC 268 10/15/2021   FERRITIN 133 05/20/2022   FERRITIN 117 12/15/2021   FERRITIN 330 10/15/2021   IRONPCTSAT 12 (L) 05/20/2022   IRONPCTSAT 10 (L) 12/15/2021   IRONPCTSAT 23 10/15/2021   No results found for: "LDH"   STUDIES:    NM PET (PSMA) SKULL TO MID THIGH Result Date: 07/27/2023 CLINICAL DATA:  Prostate carcinoma with biochemical recurrence. Patient status post LU 177 PSMA therapy. Four cycles completed 06/15/2023. Most recent PSA equal 21.35 increased from 13.33 months prior. PSA improved from 78 months ago. EXAM: NUCLEAR MEDICINE PET SKULL BASE TO THIGH TECHNIQUE: 7.7 mCi F18 Piflufolastat (Pylarify) was injected intravenously. Full-ring PET imaging was performed from the skull base to thigh after the radiotracer. CT  data was obtained and used for attenuation correction and anatomic localization. COMPARISON:  None Available. FINDINGS: NECK No radiotracer activity in neck lymph nodes. Incidental CT finding: None. CHEST No radiotracer accumulation within mediastinal or hilar lymph nodes. No suspicious pulmonary nodules on the CT scan. Incidental CT finding: None. ABDOMEN/PELVIS Prostate: Brachytherapy seeds in the prostate gland. No focal radiotracer activity. Lymph nodes: No abnormal radiotracer accumulation within pelvic or abdominal nodes. Liver: No evidence of liver metastasis. Incidental CT finding: None. SKELETON Interval improvement skeletal metastasis. For example: Sclerotic lesion in the LEFT iliac wing with SUV max equal 8.1 (image 156) decreased from SUV max equal 60. There is a newexpansile sclerotic lesion at this site now measuring 1.7 cm. Previous tiny 5 mm sclerotic focus. Lesion in the RIGHT sacral ala SUV max equal 16.9 compared SUV max equal 16.6. Unchanged New sclerosis of lesion the tip of the RIGHT scapula with SUV max equal 11.6 markedly decreased from SUV max equal 20.8 on prior. Lesion at L1 with SUV max equal 8.3 compared SUV max equal 18.1. No new radiotracer avid lesions are evident IMPRESSION: 1. Interval improvement in skeletal metastasis. Lesion are decreased radiotracer activity with increase in sclerosis. 2. No evidence of local prostate carcinoma recurrence in the prostate gland. 3. No evidence of  nodal metastasis or visceral metastasis. Electronically Signed   By: Genevive Bi M.D.   On: 07/27/2023 12:08

## 2023-07-28 ENCOUNTER — Inpatient Hospital Stay: Payer: Medicare PPO

## 2023-07-28 ENCOUNTER — Inpatient Hospital Stay (HOSPITAL_BASED_OUTPATIENT_CLINIC_OR_DEPARTMENT_OTHER): Payer: Medicare PPO | Admitting: Hematology

## 2023-07-28 ENCOUNTER — Inpatient Hospital Stay: Payer: Medicare PPO | Attending: Hematology

## 2023-07-28 VITALS — BP 164/79 | HR 84 | Temp 97.1°F | Resp 20 | Wt 240.1 lb

## 2023-07-28 DIAGNOSIS — C61 Malignant neoplasm of prostate: Secondary | ICD-10-CM | POA: Diagnosis not present

## 2023-07-28 DIAGNOSIS — C771 Secondary and unspecified malignant neoplasm of intrathoracic lymph nodes: Secondary | ICD-10-CM | POA: Insufficient documentation

## 2023-07-28 DIAGNOSIS — Z7982 Long term (current) use of aspirin: Secondary | ICD-10-CM | POA: Diagnosis not present

## 2023-07-28 DIAGNOSIS — C7951 Secondary malignant neoplasm of bone: Secondary | ICD-10-CM | POA: Insufficient documentation

## 2023-07-28 DIAGNOSIS — Z87891 Personal history of nicotine dependence: Secondary | ICD-10-CM | POA: Insufficient documentation

## 2023-07-28 DIAGNOSIS — M8580 Other specified disorders of bone density and structure, unspecified site: Secondary | ICD-10-CM | POA: Insufficient documentation

## 2023-07-28 DIAGNOSIS — Z79899 Other long term (current) drug therapy: Secondary | ICD-10-CM | POA: Diagnosis not present

## 2023-07-28 DIAGNOSIS — Z95828 Presence of other vascular implants and grafts: Secondary | ICD-10-CM

## 2023-07-28 LAB — CBC WITH DIFFERENTIAL/PLATELET
Abs Immature Granulocytes: 0.02 10*3/uL (ref 0.00–0.07)
Basophils Absolute: 0.1 10*3/uL (ref 0.0–0.1)
Basophils Relative: 1 %
Eosinophils Absolute: 0.1 10*3/uL (ref 0.0–0.5)
Eosinophils Relative: 2 %
HCT: 35.1 % — ABNORMAL LOW (ref 39.0–52.0)
Hemoglobin: 11.1 g/dL — ABNORMAL LOW (ref 13.0–17.0)
Immature Granulocytes: 0 %
Lymphocytes Relative: 8 %
Lymphs Abs: 0.6 10*3/uL — ABNORMAL LOW (ref 0.7–4.0)
MCH: 28.1 pg (ref 26.0–34.0)
MCHC: 31.6 g/dL (ref 30.0–36.0)
MCV: 88.9 fL (ref 80.0–100.0)
Monocytes Absolute: 0.9 10*3/uL (ref 0.1–1.0)
Monocytes Relative: 12 %
Neutro Abs: 5.5 10*3/uL (ref 1.7–7.7)
Neutrophils Relative %: 77 %
Platelets: 294 10*3/uL (ref 150–400)
RBC: 3.95 MIL/uL — ABNORMAL LOW (ref 4.22–5.81)
RDW: 13.8 % (ref 11.5–15.5)
WBC: 7.2 10*3/uL (ref 4.0–10.5)
nRBC: 0 % (ref 0.0–0.2)

## 2023-07-28 LAB — COMPREHENSIVE METABOLIC PANEL
ALT: 9 U/L (ref 0–44)
AST: 16 U/L (ref 15–41)
Albumin: 3.5 g/dL (ref 3.5–5.0)
Alkaline Phosphatase: 35 U/L — ABNORMAL LOW (ref 38–126)
Anion gap: 10 (ref 5–15)
BUN: 15 mg/dL (ref 8–23)
CO2: 27 mmol/L (ref 22–32)
Calcium: 9.5 mg/dL (ref 8.9–10.3)
Chloride: 101 mmol/L (ref 98–111)
Creatinine, Ser: 0.98 mg/dL (ref 0.61–1.24)
GFR, Estimated: >60 mL/min (ref >60)
Glucose, Bld: 137 mg/dL — ABNORMAL HIGH (ref 70–99)
Potassium: 3.9 mmol/L (ref 3.5–5.1)
Sodium: 138 mmol/L (ref 135–145)
Total Bilirubin: 0.7 mg/dL (ref 0.0–1.2)
Total Protein: 6.5 g/dL (ref 6.5–8.1)

## 2023-07-28 LAB — MAGNESIUM: Magnesium: 1.9 mg/dL (ref 1.7–2.4)

## 2023-07-28 MED ORDER — SODIUM CHLORIDE 0.9% FLUSH
10.0000 mL | Freq: Once | INTRAVENOUS | Status: AC
  Administered 2023-07-28: 10 mL via INTRAVENOUS

## 2023-07-28 MED ORDER — HEPARIN SOD (PORK) LOCK FLUSH 100 UNIT/ML IV SOLN
500.0000 [IU] | Freq: Once | INTRAVENOUS | Status: AC
  Administered 2023-07-28: 500 [IU] via INTRAVENOUS

## 2023-07-28 MED ORDER — DENOSUMAB 120 MG/1.7ML ~~LOC~~ SOLN
120.0000 mg | Freq: Once | SUBCUTANEOUS | Status: AC
  Administered 2023-07-28: 120 mg via SUBCUTANEOUS
  Filled 2023-07-28: qty 1.7

## 2023-07-28 NOTE — Patient Instructions (Signed)
 [facility] at [facility] Discharge Instructions   You were seen and examined today by Dr. Ellin Saba.  He reviewed the results of your lab work which are normal/stable.   He reviewed the results of your PET scan which shows improvement in the prostate cancer.   We will see you back in .   Return as scheduled.    Thank you for choosing [facility] at [facility] to provide your oncology and hematology care.  To afford each patient quality time with our provider, please arrive at least 15 minutes before your scheduled appointment time.   If you have a lab appointment with the Cancer Center please come in thru the Main Entrance and check in at the main information desk.  You need to re-schedule your appointment should you arrive 10 or more minutes late.  We strive to give you quality time with our providers, and arriving late affects you and other patients whose appointments are after yours.  Also, if you no show three or more times for appointments you may be dismissed from the clinic at the providers discretion.     Again, thank you for choosing [facility].  Our hope is that these requests will decrease the amount of time that you wait before being seen by our physicians.       _____________________________________________________________  Should you have questions after your visit to [facility], please contact our office at [phone] and follow the prompts.  Our office hours are 8:00 a.m. and 4:30 p.m. Monday - Friday.  Please note that voicemails left after 4:00 p.m. may not be returned until the following business day.  We are closed weekends and major holidays.  You do have access to a nurse 24-7, just call the main number to the clinic [phone] and do not press any options, hold on the line and a nurse will answer the phone.    For prescription refill requests, have your pharmacy contact our office and  allow 72 hours.    Due to Covid, you will need to wear a mask upon entering the hospital. If you do not have a mask, a mask will be given to you at the Main Entrance upon arrival. For doctor visits, patients may have 1 support person age 10 or older with them. For treatment visits, patients can not have anyone with them due to social distancing guidelines and our immunocompromised population.

## 2023-07-28 NOTE — Progress Notes (Signed)
 Xgeva injection given per orders. Patient tolerated it well without problems. Vitals stable and discharged home from clinic ambulatory. Follow up as scheduled.

## 2023-07-29 LAB — PSA: Prostatic Specific Antigen: 36 ng/mL — ABNORMAL HIGH (ref 0.00–4.00)

## 2023-09-07 ENCOUNTER — Other Ambulatory Visit: Payer: Self-pay

## 2023-09-07 DIAGNOSIS — D500 Iron deficiency anemia secondary to blood loss (chronic): Secondary | ICD-10-CM

## 2023-09-07 DIAGNOSIS — C61 Malignant neoplasm of prostate: Secondary | ICD-10-CM

## 2023-09-07 NOTE — Progress Notes (Signed)
 [facility] [address], [place] [address]    Clinic Day:  09/08/2023  Referring physician: Clinic, [name]  Patient Care Team: Clinic, [name] as PCP - General [name], MD as Medical Oncologist (Medical Oncology) Margaretmary Dys, MD as Consulting Physician (Radiation Oncology)   ASSESSMENT & PLAN:   Assessment: 1.  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 -Abiraterone and prednisone from 07/13/2016 through 04/09/2019, discontinued secondary to PSA progression. -Enzalutamide from 04/12/2019 through 11/03/2019 with progression. -Last Lupron on 06/09/2019. -CT CAP on 11/10/2019 shows no findings of active malignancy.  Stable small sclerotic lesions at T4 and T10, nonspecific.  Right hydronephrosis with right proximal hydroureter extending down to a 0.8 x 0.8 x 0.4 cm proximal ureteral stone at L3 vertical level.  Nonobstructive right and possibly left nephrolithiasis. -Bone scan on 11/10/2019 shows right proximal tibial metaphyseal activity from recent trauma.  Degenerative findings in the spine, right sternoclavicular joint, shoulders and right foot.  No evidence of metastatic disease. -Foundation 1 test shows MS-stable.  AR amplification.  Sensitivity is reduced due to sample quality. -Guardant 360 on 12/12/2019 MSI high not detected.  TMB was not evaluable.  No other targetable mutations. -F-18-PYLARIFY PET scan showed intense radiotracer activity in the left supraclavicular and prevascular lymph nodes, measuring 10 mm.  Cluster of small prevascular lymph nodes measures 5-7 mm each with SUV 54.  AP window lymph node measuring 8 mm.  No activity in the prostate gland or abdominal or pelvic lymph nodes.  No skeletal activity. - SBRT to the left supraclavicular lymph node and prevascular mediastinal  lymph node from 08/27/2020 through 09/06/2020, 40 Gray in 5 fractions. - CT CAP (10/22/2021): Done at [facility]: No lymphadenopathy.  Cirrhosis.  Intrahepatic and extrahepatic biliary ductal dilatation. - Bone scan (10/28/2021): New focal abnormal tracer uptake at the inferior aspect of the right scapula. - PSMA PET scan (11/13/2021): Interval development of multifocal tracer avid bone metastasis, only 1 of these lesions is visible on the recent nuclear medicine bone scan dated 10/28/2021.  New tracer avid right axillary, subcarinal, left paratracheal, left retrocrural and upper abdominal lymph nodes. - 13 cycles of cabazitaxel from 12/15/2021 through 09/08/2022 with progression - Pluvicto cycle 1 on 11/16/2022, cycle 2 on 01/12/2023, cycle 3 on 02/09/2023, cycle 4 on 03/23/2023, cycle 5 on 05/04/2023 and cycle 6 on 06/15/2023.   2.  Androgen deprivation therapy induced bone loss: -Received Zometa every 3 months for a year completed on 10/29/2017.   3.  Genetic testing: -Germline mutation testing was negative.    Plan: 1.  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  Last Eligard 45 mg on 04/27/2023. - PSMA PET scan (07/19/2023): Interval improvement in skeletal metastasis with decrease in hypermetabolic activity.  No evidence of nodal or visceral metastatic disease. - He denies any new onset pains. - Reviewed labs today: Normal LFTs and creatinine.  CBC grossly normal with mild normocytic anemia.  Last PSA is gone up to 36 on 07/28/2023, up from 21.35 on 06/09/2023. - PSA from today is pending.  If it continues to trend up, recommend radium-223 treatments every 4 weeks for 6 doses. - Discussed side effects including but not limited to cytopenias, GI side effects. - PSA is elevated at 122.  Will make a referral to  Dr. Lorri Rota for Xofigo. - RTC 6 weeks for follow-up.   2.  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  Continue denosumab today and every 6 weeks.   3.  Leg swelling: - Continue Bumex daily.  Mild swelling is stable.   4.  Peripheral neuropathy: - Numbness in the feet is stable.  5.  Hypomagnesemia: - Continue magnesium twice daily.  Magnesium is normal.    No orders of the defined types were placed in this encounter.     I,Katie Daubenspeck,acting as a Neurosurgeon for [name], MD.,have documented all relevant documentation on the behalf of [name], MD,as directed by  [name], MD while in the presence of [name], MD.   I, [name] MD, have reviewed the above documentation for accuracy and completeness, and I agree with the above.   [name], MD   4/16/20255:30 PM  CHIEF COMPLAINT:   Diagnosis: metastatic prostate cancer    Cancer Staging  No matching staging information was found for the patient.    Prior Therapy: 1. Docetaxel x 14 cycles from 08/11/2015 through 06/12/2016. 2. Abiraterone from 07/13/2016 through 04/09/2019. 3. Enzalutamide from 04/12/2019 to 11/03/2019. 4. SBRT to nodal mets, 08/27/20 - 09/06/20 5. ADT (Lupron/Eligard) through 08/06/21 6. Cabazitaxel, 13 cycles, 12/15/21 - 09/08/22 7. Pluvicto, 6 cycles-- 11/16/22, 01/12/23, 02/09/23, 03/23/23, 05/04/23, and 06/15/23  Current Therapy: Observation   HISTORY OF PRESENT ILLNESS:   Oncology History  Prostate cancer metastatic to intrathoracic lymph node (HCC)  07/24/2015 Initial Diagnosis   Prostate cancer metastatic to the left supraclavicular and anterior mediastinal lymph nodes   08/21/2015 - 06/12/2016 Chemotherapy   Docetaxel every 3 weeks for 14 cycles, discontinued as PSA has plateaued around 11    07/13/2016 Treatment Plan Change   Zytiga 1000 milligrams daily along with prednisone 5 mg daily, started secondary to fluid overload from docetaxel   10/14/2018 Genetic Testing   Negative genetic testing.  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  The Common Hereditary Gene Panel offered by [facility] includes sequencing and/or deletion duplication testing of the following 48 genes: APC, ATM, AXIN2, BARD1, BMPR1A, BRCA1, BRCA2, BRIP1, CDH1, CDK4, CDKN2A (p14ARF), CDKN2A (p16INK4a), CHEK2, CTNNA1, DICER1, EPCAM (Deletion/duplication testing only), GREM1 (promoter region deletion/duplication testing only), KIT, MEN1, MLH1, MSH2, MSH3, MSH6, MUTYH, NBN, NF1, NHTL1, PALB2, PDGFRA, PMS2, POLD1, POLE, PTEN, RAD50, RAD51C, RAD51D, RNF43, SDHB, SDHC, SDHD, SMAD4, SMARCA4. STK11, TP53, TSC1, TSC2, and VHL.  The following genes were evaluated for sequence changes only: SDHA and HOXB13 c.251G>A variant only. The report date is Oct 14, 2018.   09/18/2019 Genetic Testing   Foundation One CDx      12/12/2019 Genetic Testing   Guardant 360       12/15/2021 - 01/06/2022 Chemotherapy   Patient is on Treatment Plan : PROSTATE Cabazitaxel + Prednisone q21d     12/15/2021 -  Chemotherapy   Patient is on Treatment Plan : PROSTATE Cabazitaxel (20) D1 + Prednisone D1-21 q21d        INTERVAL HISTORY:   Andre [name] is a 83 y.o. male presenting to clinic today for follow up of metastatic prostate cancer. He was last seen by me on 07/28/23.  Today, he states that he is doing well overall. His appetite level is at 100%. His energy level is at 60%.  PAST MEDICAL HISTORY:   Past Medical History: Past Medical History:  Diagnosis Date   COPD (chronic obstructive pulmonary disease) (HCC)    Diabetes mellitus  without complication (HCC)    Family history of ovarian cancer    Family history of prostate cancer    Hypertension    Prostate cancer [facility])     Surgical History: Past Surgical History:  Procedure Laterality Date   GOLD SEED IMPLANT      Social History: Social History   Socioeconomic History   Marital status: Widowed    Spouse name: Not on file   Number of children: Not on file   Years of education: Not on file   Highest education  level: Not on file  Occupational History   Not on file  Tobacco Use   Smoking status: Former    Current packs/day: 0.00    Average packs/day: 0.3 packs/day for 60.0 years (15.0 ttl pk-yrs)    Types: Cigarettes    Start date: 06/25/1954    Quit date: 06/25/2014    Years since quitting: 9.2   Smokeless tobacco: Never   Tobacco comments:    smoked since young age, quit in 2016  Substance and Sexual Activity   Alcohol use: Not Currently   Drug use: No   Sexual activity: Not Currently  Other Topics Concern   Not on file  Social History Narrative   Not on file   Social Drivers of Health   Financial Resource Strain: Low Risk  (08/08/2020)   Received from [facility], [facility]   Overall Financial Resource Strain (CARDIA)    Difficulty of Paying Living Expenses: Not hard at all  Food Insecurity: No Food Insecurity (08/08/2020)   Received from [facility], [facility]   Hunger Vital Sign    Worried About Running Out of Food in the Last Year: Never true    Ran Out of Food in the Last Year: Never true  Transportation Needs: No Transportation Needs (08/08/2020)   Received from [facility], [facility]   [facility] - Transportation    Lack of Transportation (Medical): No    Lack of Transportation (Non-Medical): No  Physical Activity: Insufficiently Active (05/21/2020)   Exercise Vital Sign    Days of Exercise per Week: 2 days    Minutes of Exercise per Session: 20 min  Stress: No Stress Concern Present (05/21/2020)   [employer] of Occupational Health - Occupational Stress Questionnaire    Feeling of Stress : Not at all  Social Connections: Moderately Isolated (05/21/2020)   Social Connection and Isolation Panel [NHANES]    Frequency of Communication with Friends and Family: More than three times a week    Frequency of Social Gatherings with Friends and Family: More than three times a week    Attends Religious Services: 1 to 4 times per year    Active  Member of [employer] or Organizations: No    Attends Banker Meetings: Never    Marital Status: Widowed  Intimate Partner Violence: Not At Risk (05/21/2020)   Humiliation, Afraid, Rape, and Kick questionnaire    Fear of Current or Ex-Partner: No    Emotionally Abused: No    Physically Abused: No    Sexually Abused: No    Family History: Family History  Problem Relation Age of Onset   Ovarian cancer Mother 63   Heart attack Father    Prostate cancer Maternal Uncle 67   Cancer Cousin        pat cousin with unknown cancer   Colon cancer Neg Hx    Pancreatic cancer Neg Hx    Breast  cancer Neg Hx     Current Medications:  Current Outpatient Medications:    ACCU-CHEK GUIDE test strip, , Disp: , Rfl:    Accu-Chek Softclix Lancets lancets, , Disp: , Rfl:    acetaminophen (TYLENOL) 500 MG tablet, Take 500 mg by mouth every 6 (six) hours as needed for moderate pain., Disp: , Rfl:    albuterol (VENTOLIN HFA) 108 (90 Base) MCG/ACT inhaler, INHALE 2 PUFFS BY MOUTH EVERY 4 TO 6 HOURS AS NEEDED, Disp: , Rfl:    amLODipine (NORVASC) 2.5 MG tablet, Take 1 tablet (2.5 mg total) by mouth daily as needed., Disp: 30 tablet, Rfl: 0   aspirin 81 MG EC tablet, Take 1 tablet by mouth daily., Disp: , Rfl:    atorvastatin (LIPITOR) 10 MG tablet, TAKE 1 TABLET BY MOUTH EVERY DAY (Patient taking differently: Take 5 mg by mouth daily.), Disp: 90 tablet, Rfl: 3   azelastine (ASTELIN) 0.1 % nasal spray, USE 2 SPRAYS IN EACH NOSTRIL TWICE A DAY FOR ALLERGIC RHINITIS, Disp: , Rfl:    Blood Glucose Monitoring Suppl (ACCU-CHEK GUIDE) w/Device KIT, , Disp: , Rfl:    brompheniramine-pseudoephedrine-DM 30-2-10 MG/5ML syrup, TAKE 5 ML BY MOUTH EVERY 4 HOURS AS NEEDED FOR COUGH, Disp: 120 mL, Rfl: 0   bumetanide (BUMEX) 1 MG tablet, TAKE 1 AND 1/2 TABLETS(1.5 MG) BY MOUTH DAILY, Disp: 45 tablet, Rfl: 6   CALCIUM PO, Take 600 mg by mouth daily. , Disp: , Rfl:    cetirizine (ZYRTEC) 10 MG tablet, Take 1 tablet  by mouth daily., Disp: , Rfl:    CHERATUSSIN AC 100-10 MG/5ML syrup, Take 5 mLs by mouth daily as needed. , Disp: , Rfl: 0   Cholecalciferol (VITAMIN D3) 50 MCG (2000 UT) capsule, Take 2,000 Units by mouth daily., Disp: , Rfl:    dicyclomine (BENTYL) 20 MG tablet, Take 1 tablet by mouth 3 (three) times daily as needed., Disp: , Rfl:    dorzolamide (TRUSOPT) 2 % ophthalmic solution, Place 1 drop into both eyes 3 (three) times daily., Disp: , Rfl:    erythromycin ophthalmic ointment, 3 (three) times daily., Disp: , Rfl:    ferrous sulfate 325 (65 FE) MG tablet, Take 1 tablet by mouth daily., Disp: , Rfl:    fexofenadine (ALLEGRA) 180 MG tablet, Take 1 tablet every day by oral route at bedtime for 90 days., Disp: , Rfl:    fluticasone (FLONASE) 50 MCG/ACT nasal spray, USE 2 SPRAY(S) IN EACH NOSTRIL ONCE DAILY FOR 30 DAYS, Disp: 15.8 mL, Rfl: 12   ibuprofen (ADVIL,MOTRIN) 600 MG tablet, Take 600 mg by mouth every 6 (six) hours as needed. , Disp: , Rfl: 0   ipratropium (ATROVENT) 0.03 % nasal spray, USE 2 SPRAYS IN EACH NOSTRIL TWICE DAILY AS NEEDED, Disp: , Rfl:    Ipratropium-Albuterol (COMBIVENT) 20-100 MCG/ACT AERS respimat, INHALE 1 PUFF BY MOUTH FOUR TIMES A DAY AS NEEDED COPD, Disp: , Rfl:    latanoprost (XALATAN) 0.005 % ophthalmic solution, INSTILL 1 DROP INTO AFFECTED EYE(S) BY OPHTHALMIC ROUTE ONCE DAILY INTHE EVENING, Disp: , Rfl:    Latanoprostene Bunod (VYZULTA) 0.024 % SOLN, Apply to eye., Disp: , Rfl:    Leuprolide Acetate, 4 Month, (ELIGARD) 30 MG injection, 30 MG SUBCUTANEOUSLY Q90D PRN, Disp: , Rfl:    losartan (COZAAR) 100 MG tablet, Take 1 tablet by mouth daily., Disp: , Rfl:    magnesium oxide (MAG-OX) 400 (240 Mg) MG tablet, TAKE 1 TABLET BY MOUTH TWICE DAILY, Disp: 180 tablet,  Rfl: 3   magnesium oxide (MAG-OX) 400 MG tablet, Take 1 tablet (400 mg total) by mouth daily. (Patient taking differently: Take 400 mg by mouth 2 (two) times daily.), Disp: 90 tablet, Rfl: 2   metFORMIN  (GLUCOPHAGE) 500 MG tablet, Take 2 tablets (1,000 mg total) by mouth 2 (two) times daily with a meal., Disp: 120 tablet, Rfl: 3   montelukast (SINGULAIR) 10 MG tablet, Take 10 mg by mouth at bedtime., Disp: , Rfl:    Multiple Vitamins-Minerals (PRESERVISION AREDS PO), 1 tablet daily., Disp: , Rfl:    potassium chloride (MICRO-K) 10 MEQ CR capsule, TAKE 4 CAPSULE BY MOUTH DAILY WITH FOOD, OPEN AND SPRINKLE ON FOOD, Disp: 348 capsule, Rfl: 2   prochlorperazine (COMPAZINE) 10 MG tablet, Take 1 tablet (10 mg total) by mouth every 6 (six) hours as needed for nausea or vomiting., Disp: 60 tablet, Rfl: 3   sucralfate (CARAFATE) 1 g tablet, Take 1 tablet by mouth 4 (four) times daily as needed., Disp: , Rfl:    TRELEGY ELLIPTA 100-62.5-25 MCG/INH AEPB, INHALE 1 PUFF ONCE DAILY, Disp: , Rfl:    vitamin C (ASCORBIC ACID) 500 MG tablet, Take 500 mg by mouth daily., Disp: , Rfl:    tamsulosin (FLOMAX) 0.4 MG CAPS capsule, Take 1 tablet by mouth daily., Disp: , Rfl:    triamcinolone cream (KENALOG) 0.1 %, Apply topically 2 (two) times daily., Disp: , Rfl:  No current facility-administered medications for this visit.  Facility-Administered Medications Ordered in Other Visits:    denosumab (XGEVA) 120 MG/1.7ML injection, , , ,    diphenhydrAMINE (BENADRYL) 50 MG/ML injection, , , ,    famotidine (PEPCID) 20-0.9 MG/50ML-% IVPB, , , ,    Allergies: Allergies  Allergen Reactions   Lisinopril Cough   Metoprolol Other (See Comments) and Cough    Increases frequency of cough Other reaction(s): Other (See Comments) Increases frequency of cough   Semaglutide Nausea And Vomiting    REVIEW OF SYSTEMS:   Review of Systems  Constitutional:  Negative for chills, fatigue and fever.  HENT:   Negative for lump/mass, mouth sores, nosebleeds, sore throat and trouble swallowing.   Eyes:  Negative for eye problems.  Respiratory:  Positive for cough and shortness of breath.   Cardiovascular:  Negative for chest pain,  leg swelling and palpitations.  Gastrointestinal:  Positive for constipation. Negative for abdominal pain, diarrhea, nausea and vomiting.  Genitourinary:  Negative for bladder incontinence, difficulty urinating, dysuria, frequency, hematuria and nocturia.   Musculoskeletal:  Negative for arthralgias, back pain, flank pain, myalgias and neck pain.  Skin:  Negative for itching and rash.  Neurological:  Positive for numbness. Negative for dizziness and headaches.  Hematological:  Does not bruise/bleed easily.  Psychiatric/Behavioral:  Negative for depression, sleep disturbance and suicidal ideas. The patient is not nervous/anxious.   All other systems reviewed and are negative.    VITALS:   Blood pressure 131/77, pulse 76, temperature 97.7 F (36.5 C), temperature source Tympanic, resp. rate 18, height 5' 8.5" (1.74 m), weight 227 lb (103 kg), SpO2 98%.  Wt Readings from Last 3 Encounters:  09/08/23 227 lb (103 kg)  07/28/23 240 lb 1.6 oz (108.9 kg)  06/09/23 237 lb (107.5 kg)    Body mass index is 34.01 kg/m.  Performance status (ECOG): 1 - Symptomatic but completely ambulatory  PHYSICAL EXAM:   Physical Exam Vitals and nursing note reviewed. Exam conducted with a chaperone present.  Constitutional:      Appearance:  Normal appearance.  Cardiovascular:     Rate and Rhythm: Normal rate and regular rhythm.     Pulses: Normal pulses.     Heart sounds: Normal heart sounds.  Pulmonary:     Effort: Pulmonary effort is normal.     Breath sounds: Normal breath sounds.  Abdominal:     Palpations: Abdomen is soft. There is no hepatomegaly, splenomegaly or mass.     Tenderness: There is no abdominal tenderness.  Musculoskeletal:     Right lower leg: No edema.     Left lower leg: No edema.  Lymphadenopathy:     Cervical: No cervical adenopathy.     Right cervical: No superficial, deep or posterior cervical adenopathy.    Left cervical: No superficial, deep or posterior cervical  adenopathy.     Upper Body:     Right upper body: No supraclavicular or axillary adenopathy.     Left upper body: No supraclavicular or axillary adenopathy.  Neurological:     General: No focal deficit present.     Mental Status: He is alert and oriented to person, place, and time.  Psychiatric:        Mood and Affect: Mood normal.        Behavior: Behavior normal.    LABS:   CBC     Component Value Date/Time   WBC 8.1 09/08/2023 1011   RBC 3.94 (L) 09/08/2023 1011   HGB 10.8 (L) 09/08/2023 1011   HCT 34.6 (L) 09/08/2023 1011   PLT 307 09/08/2023 1011   MCV 87.8 09/08/2023 1011   MCH 27.4 09/08/2023 1011   MCHC 31.2 09/08/2023 1011   RDW 13.5 09/08/2023 1011   LYMPHSABS 0.6 (L) 09/08/2023 1011   MONOABS 0.8 09/08/2023 1011   EOSABS 0.4 09/08/2023 1011   BASOSABS 0.1 09/08/2023 1011    CMP      Component Value Date/Time   NA 140 09/08/2023 1011   K 4.0 09/08/2023 1011   CL 99 09/08/2023 1011   CO2 27 09/08/2023 1011   GLUCOSE 134 (H) 09/08/2023 1011   BUN 18 09/08/2023 1011   CREATININE 0.96 09/08/2023 1011   CALCIUM 9.5 09/08/2023 1011   PROT 6.6 09/08/2023 1011   ALBUMIN 3.5 09/08/2023 1011   AST 17 09/08/2023 1011   ALT 9 09/08/2023 1011   ALKPHOS 42 09/08/2023 1011   BILITOT 0.6 09/08/2023 1011   GFRNONAA >60 09/08/2023 1011   GFRAA >60 02/06/2020 1349     No results found for: "CEA1", "CEA" / No results found for: "CEA1", "CEA" No results found for: "PSA1" No results found for: "ZOX096" No results found for: "CAN125"  No results found for: "TOTALPROTELP", "ALBUMINELP", "A1GS", "A2GS", "BETS", "BETA2SER", "GAMS", "MSPIKE", "SPEI" Lab Results  Component Value Date   TIBC 323 05/20/2022   TIBC 325 12/15/2021   TIBC 268 10/15/2021   FERRITIN 133 05/20/2022   FERRITIN 117 12/15/2021   FERRITIN 330 10/15/2021   IRONPCTSAT 12 (L) 05/20/2022   IRONPCTSAT 10 (L) 12/15/2021   IRONPCTSAT 23 10/15/2021   No results found for: "LDH"   STUDIES:   No  results found.

## 2023-09-08 ENCOUNTER — Inpatient Hospital Stay

## 2023-09-08 ENCOUNTER — Inpatient Hospital Stay: Attending: Hematology

## 2023-09-08 ENCOUNTER — Inpatient Hospital Stay (HOSPITAL_BASED_OUTPATIENT_CLINIC_OR_DEPARTMENT_OTHER): Admitting: Hematology

## 2023-09-08 VITALS — BP 131/77 | HR 76 | Temp 97.7°F | Resp 18 | Ht 68.5 in | Wt 227.0 lb

## 2023-09-08 DIAGNOSIS — Z95828 Presence of other vascular implants and grafts: Secondary | ICD-10-CM

## 2023-09-08 DIAGNOSIS — C7951 Secondary malignant neoplasm of bone: Secondary | ICD-10-CM | POA: Diagnosis present

## 2023-09-08 DIAGNOSIS — C61 Malignant neoplasm of prostate: Secondary | ICD-10-CM | POA: Diagnosis not present

## 2023-09-08 DIAGNOSIS — D500 Iron deficiency anemia secondary to blood loss (chronic): Secondary | ICD-10-CM

## 2023-09-08 DIAGNOSIS — C771 Secondary and unspecified malignant neoplasm of intrathoracic lymph nodes: Secondary | ICD-10-CM

## 2023-09-08 LAB — CBC WITH DIFFERENTIAL/PLATELET
Abs Immature Granulocytes: 0.03 10*3/uL (ref 0.00–0.07)
Basophils Absolute: 0.1 10*3/uL (ref 0.0–0.1)
Basophils Relative: 1 %
Eosinophils Absolute: 0.4 10*3/uL (ref 0.0–0.5)
Eosinophils Relative: 5 %
HCT: 34.6 % — ABNORMAL LOW (ref 39.0–52.0)
Hemoglobin: 10.8 g/dL — ABNORMAL LOW (ref 13.0–17.0)
Immature Granulocytes: 0 %
Lymphocytes Relative: 7 %
Lymphs Abs: 0.6 10*3/uL — ABNORMAL LOW (ref 0.7–4.0)
MCH: 27.4 pg (ref 26.0–34.0)
MCHC: 31.2 g/dL (ref 30.0–36.0)
MCV: 87.8 fL (ref 80.0–100.0)
Monocytes Absolute: 0.8 10*3/uL (ref 0.1–1.0)
Monocytes Relative: 10 %
Neutro Abs: 6.3 10*3/uL (ref 1.7–7.7)
Neutrophils Relative %: 77 %
Platelets: 307 10*3/uL (ref 150–400)
RBC: 3.94 MIL/uL — ABNORMAL LOW (ref 4.22–5.81)
RDW: 13.5 % (ref 11.5–15.5)
WBC: 8.1 10*3/uL (ref 4.0–10.5)
nRBC: 0 % (ref 0.0–0.2)

## 2023-09-08 LAB — COMPREHENSIVE METABOLIC PANEL WITH GFR
ALT: 9 U/L (ref 0–44)
AST: 17 U/L (ref 15–41)
Albumin: 3.5 g/dL (ref 3.5–5.0)
Alkaline Phosphatase: 42 U/L (ref 38–126)
Anion gap: 14 (ref 5–15)
BUN: 18 mg/dL (ref 8–23)
CO2: 27 mmol/L (ref 22–32)
Calcium: 9.5 mg/dL (ref 8.9–10.3)
Chloride: 99 mmol/L (ref 98–111)
Creatinine, Ser: 0.96 mg/dL (ref 0.61–1.24)
GFR, Estimated: >60 mL/min (ref >60)
Glucose, Bld: 134 mg/dL — ABNORMAL HIGH (ref 70–99)
Potassium: 4 mmol/L (ref 3.5–5.1)
Sodium: 140 mmol/L (ref 135–145)
Total Bilirubin: 0.6 mg/dL (ref 0.0–1.2)
Total Protein: 6.6 g/dL (ref 6.5–8.1)

## 2023-09-08 LAB — MAGNESIUM: Magnesium: 2 mg/dL (ref 1.7–2.4)

## 2023-09-08 MED ORDER — SODIUM CHLORIDE FLUSH 0.9 % IV SOLN
10.0000 mL | Freq: Once | INTRAVENOUS | Status: AC
  Administered 2023-09-08: 10 mL via INTRAVENOUS
  Filled 2023-09-08: qty 10

## 2023-09-08 MED ORDER — HEPARIN SOD (PORK) LOCK FLUSH 100 UNIT/ML IV SOLN
500.0000 [IU] | Freq: Once | INTRAVENOUS | Status: AC
  Administered 2023-09-08: 500 [IU] via INTRAVENOUS

## 2023-09-08 MED ORDER — DENOSUMAB 120 MG/1.7ML ~~LOC~~ SOLN
120.0000 mg | Freq: Once | SUBCUTANEOUS | Status: AC
  Administered 2023-09-08: 120 mg via SUBCUTANEOUS
  Filled 2023-09-08: qty 1.7

## 2023-09-08 NOTE — Progress Notes (Signed)
 Port flushed with good blood return noted. No bruising or swelling at site. Bandaid applied. Patient taking calcium as directed. Denied tooth, jaw, and leg pain. No recent or upcoming dental visits. Labs reviewed. Patient tolerated injection with no complaints voiced. See MAR for details. Patient stable during and after injection. Site clean and dry with no bruising or swelling noted. Band aid applied. Vss with discharge and left in satisfactory condition with no s/s of distress.

## 2023-09-08 NOTE — Patient Instructions (Addendum)
 [facility] at [facility] Discharge Instructions   You were seen and examined today by Dr. Cheree Cords.  He reviewed the results of your lab work which are normal/stable. Your PSA from last visit has gone up to 36 (previously 21 prior to that). We will see what your PSA is from today and see if there is any cause for concern.   We will arrange follow up based on today's PSA result.   Return as scheduled.    Thank you for choosing [facility] at [facility] to provide your oncology and hematology care.  To afford each patient quality time with our provider, please arrive at least 15 minutes before your scheduled appointment time.   If you have a lab appointment with the Cancer Center please come in thru the Main Entrance and check in at the main information desk.  You need to re-schedule your appointment should you arrive 10 or more minutes late.  We strive to give you quality time with our providers, and arriving late affects you and other patients whose appointments are after yours.  Also, if you no show three or more times for appointments you may be dismissed from the clinic at the providers discretion.     Again, thank you for choosing [facility].  Our hope is that these requests will decrease the amount of time that you wait before being seen by our physicians.       _____________________________________________________________  Should you have questions after your visit to [facility], please contact our office at [phone] and follow the prompts.  Our office hours are 8:00 a.m. and 4:30 p.m. Monday - Friday.  Please note that voicemails left after 4:00 p.m. may not be returned until the following business day.  We are closed weekends and major holidays.  You do have access to a nurse 24-7, just call the main number to the clinic [phone] and do not press any options, hold on the line and a nurse will answer  the phone.    For prescription refill requests, have your pharmacy contact our office and allow 72 hours.    Due to Covid, you will need to wear a mask upon entering the hospital. If you do not have a mask, a mask will be given to you at the Main Entrance upon arrival. For doctor visits, patients may have 1 support person age 41 or older with them. For treatment visits, patients can not have anyone with them due to social distancing guidelines and our immunocompromised population.

## 2023-09-08 NOTE — Patient Instructions (Signed)
 [facility] - A DEPT OF [facility]  Discharge Instructions: Thank you for choosing [facility] to provide your oncology and hematology care.  If you have a lab appointment with the Cancer Center - please note that after April 8th, 2024, all labs will be drawn in the cancer center.  You do not have to check in or register with the main entrance as you have in the past but will complete your check-in in the cancer center.  Wear comfortable clothing and clothing appropriate for easy access to any Portacath or PICC line.   We strive to give you quality time with your provider. You may need to reschedule your appointment if you arrive late (15 or more minutes).  Arriving late affects you and other patients whose appointments are after yours.  Also, if you miss three or more appointments without notifying the office, you may be dismissed from the clinic at the provider's discretion.      For prescription refill requests, have your pharmacy contact our office and allow 72 hours for refills to be completed.    Today you received the following Xgeva and port flush with lab, return as scheduled.    To help prevent nausea and vomiting after your treatment, we encourage you to take your nausea medication as directed.  BELOW ARE SYMPTOMS THAT SHOULD BE REPORTED IMMEDIATELY: *FEVER GREATER THAN 100.4 F (38 C) OR HIGHER *CHILLS OR SWEATING *NAUSEA AND VOMITING THAT IS NOT CONTROLLED WITH YOUR NAUSEA MEDICATION *UNUSUAL SHORTNESS OF BREATH *UNUSUAL BRUISING OR BLEEDING *URINARY PROBLEMS (pain or burning when urinating, or frequent urination) *BOWEL PROBLEMS (unusual diarrhea, constipation, pain near the anus) TENDERNESS IN MOUTH AND THROAT WITH OR WITHOUT PRESENCE OF ULCERS (sore throat, sores in mouth, or a toothache) UNUSUAL RASH, SWELLING OR PAIN  UNUSUAL VAGINAL DISCHARGE OR ITCHING   Items with * indicate a potential emergency and should be followed up  as soon as possible or go to the Emergency Department if any problems should occur.  Please show the CHEMOTHERAPY ALERT CARD or IMMUNOTHERAPY ALERT CARD at check-in to the Emergency Department and triage nurse.  Should you have questions after your visit or need to cancel or reschedule your appointment, please contact [facility] [place] - A DEPT OF [facility] [phone]  and follow the prompts.  Office hours are 8:00 a.m. to 4:30 p.m. Monday - Friday. Please note that voicemails left after 4:00 p.m. may not be returned until the following business day.  We are closed weekends and major holidays. You have access to a nurse at all times for urgent questions. Please call the main number to the clinic [phone] and follow the prompts.  For any non-urgent questions, you may also contact your provider using MyChart. We now offer e-Visits for anyone 84 and older to request care online for non-urgent symptoms. For details visit [email or web address].   Also download the MyChart app! Go to the app store, search "MyChart", open the app, select [facility], and log in with your MyChart username and password.

## 2023-09-09 ENCOUNTER — Encounter (HOSPITAL_COMMUNITY): Payer: Self-pay | Admitting: Hematology

## 2023-09-09 ENCOUNTER — Encounter: Payer: Self-pay | Admitting: Hematology

## 2023-09-09 ENCOUNTER — Telehealth: Payer: Self-pay | Admitting: *Deleted

## 2023-09-09 LAB — PSA: Prostatic Specific Antigen: 122.69 ng/mL — ABNORMAL HIGH (ref 0.00–4.00)

## 2023-09-09 NOTE — Telephone Encounter (Signed)
 Discussed elevated PSA with Dr. Antoine Bathe , referral sent to Dr. Lorri Rota for Xofigo treatments as soon as possible. Daughter Trevor Fudge made aware.

## 2023-09-16 NOTE — Progress Notes (Signed)
 RN left message with patient's daughter for call back to review questions about referral.

## 2023-09-16 NOTE — Progress Notes (Signed)
 RN returned daughter's call.  Additional voicemail left for call back.   RN will also plan to reach out tomorrow as well.

## 2023-09-17 NOTE — Progress Notes (Signed)
 RN spoke with patient's daughter regarding upcoming appointment for consult.  All questions answered.  No additional needs at this time.

## 2023-09-20 ENCOUNTER — Other Ambulatory Visit: Payer: Self-pay | Admitting: *Deleted

## 2023-09-20 DIAGNOSIS — C61 Malignant neoplasm of prostate: Secondary | ICD-10-CM

## 2023-09-20 MED ORDER — BUMETANIDE 1 MG PO TABS: 1.5000 mg | ORAL_TABLET | Freq: Every day | ORAL | 6 refills | Status: DC

## 2023-09-28 NOTE — Progress Notes (Signed)
 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   Patient identity verified x2.    Patient states issues as follows...  Occasional, mild constipation.  -Pain: Denies -ROM: Full -Chest:Denies -Lungs: Mild- SOB -Abdomen: Denies  Patient denies all other related issues at this time.   Meaningful use complete.   Vitals- BP 127/78   Pulse 81   Temp 98 F (36.7 C) (Oral)   Resp 18   Ht 5' 8.5" (1.74 m)   Wt 220 lb (99.8 kg)   SpO2 99%   BMI 32.96 kg/m   SAFETY ISSUES: Prior radiation? Yes. TX dates 08/27/20 - 09/06/20 Left supraclavicular node and prevascular mediastinal node were treated to 40 Gy in 5 fractions of 8 Gy.  Pacemaker/ICD? No Possible current pregnancy? N/A Male Is the patient on methotrexate? No    This concludes the interaction.  Avery Bodo, LPN   Additional info...  PET- 07/19/2023  Narrative & Impression CLINICAL DATA:  Prostate carcinoma with biochemical recurrence. Patient status post LU 177 PSMA therapy. Four cycles completed 06/15/2023. Most recent PSA equal 21.35 increased from 13.33 months prior. PSA improved from 78 months ago.   EXAM: NUCLEAR MEDICINE PET SKULL BASE TO THIGH   TECHNIQUE: 7.7 mCi F18 Piflufolastat (Pylarify ) was injected intravenously. Full-ring PET imaging was performed from the skull base to thigh after the radiotracer. CT data was obtained and used for attenuation correction and anatomic localization.   COMPARISON:  None Available.   FINDINGS: NECK   No radiotracer activity in neck lymph nodes.   Incidental CT finding: None.   CHEST   No radiotracer accumulation within mediastinal or hilar lymph nodes. No suspicious pulmonary nodules on the CT scan.   Incidental CT finding: None.   ABDOMEN/PELVIS   Prostate: Brachytherapy seeds in the prostate gland. No focal radiotracer activity.   Lymph nodes: No abnormal radiotracer accumulation  within pelvic or abdominal nodes.   Liver: No evidence of liver metastasis.   Incidental CT finding: None.   SKELETON   Interval improvement skeletal metastasis.   For example:   Sclerotic lesion in the LEFT iliac wing with SUV max equal 8.1 (image 156) decreased from SUV max equal 60. There is a newexpansile sclerotic lesion at this site now measuring 1.7 cm. Previous tiny 5 mm sclerotic focus.   Lesion in the RIGHT sacral ala SUV max equal 16.9 compared SUV max equal 16.6. Unchanged   New sclerosis of lesion the tip of the RIGHT scapula with SUV max equal 11.6 markedly decreased from SUV max equal 20.8 on prior.   Lesion at L1 with SUV max equal 8.3 compared SUV max equal 18.1.   No new radiotracer avid lesions are evident   IMPRESSION: 1. Interval improvement in skeletal metastasis. Lesion are decreased radiotracer activity with increase in sclerosis. 2. No evidence of local prostate carcinoma recurrence in the prostate gland. 3. No evidence of nodal metastasis or visceral metastasis.

## 2023-09-30 ENCOUNTER — Ambulatory Visit
Admission: RE | Admit: 2023-09-30 | Discharge: 2023-09-30 | Disposition: A | Discharge status: Home / Self Care | Source: Ambulatory Visit | Attending: Radiation Oncology | Admitting: Radiation Oncology

## 2023-09-30 ENCOUNTER — Ambulatory Visit
Admission: RE | Admit: 2023-09-30 | Discharge: 2023-09-30 | Disposition: A | Discharge status: Home / Self Care | Source: Ambulatory Visit | Attending: Urology | Admitting: Urology

## 2023-09-30 ENCOUNTER — Encounter: Payer: Self-pay | Admitting: Radiation Oncology

## 2023-09-30 VITALS — BP 127/78 | HR 81 | Temp 98.0°F | Resp 18 | Ht 68.5 in | Wt 220.0 lb

## 2023-09-30 DIAGNOSIS — C771 Secondary and unspecified malignant neoplasm of intrathoracic lymph nodes: Secondary | ICD-10-CM

## 2023-09-30 DIAGNOSIS — C61 Malignant neoplasm of prostate: Secondary | ICD-10-CM | POA: Insufficient documentation

## 2023-09-30 NOTE — Progress Notes (Signed)
 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 Malignant neoplasm metastatic to intrathoracic lymph node (HCC)  C77.1     2. Malignant neoplasm of prostate metastatic to bone [facility])  C61    C79.51       HISTORY OF PRESENT ILLNESS: Andre [name] is a 83 y.o. male seen at the request of Dr. Cheree Cords.  He is well-known to our service having previously met the patient in December 2021 to discuss treatment of oligometastatic disease involving lymph nodes in the chest. In brief summary, he was initially diagnosed with metastatic prostate cancer in 2017, treated with docetaxel initially but then changed to abiraterone /prednisone .  He established care with Dr. Cheree Cords in April 2019 and continued abiraterone  and prednisone  through 2020 but due to rising PSA, this was changed to Xtandi  in January 2021.  He developed oligometastatic disease involving the left supraclavicular nodes and prevascular mediastinal nodes in June 2021 to the Xtandi  was discontinued.  Unfortunately, his PSA continued to rise, reaching 24.05 in April 2022, therefore, he was treated with a 5 fraction course of stereotactic body radiotherapy (SBRT) to the lymph nodes 08/27/2020 - 09/06/2020.  He tolerated the SBRT well and the PSA initially responded with a nadir at 1.49 in September 2022 but began rising again in March 2023, reaching 56.53 in July 2023.  He was started on cabazitaxel  and prednisone  in July 2023 and PSA nadired at 5.8 in December 2023 before beginning to rise again by January 2024, reaching 69.68 by June 2024 despite chemotherapy.  The chemotherapy was  discontinued and he began Pluvicto  infusion in June 2024, completing 6 of 6 infusions in January 2025 with a PSA nadir at 10.82 in October 2024.  Unfortunately, the PSA increased back up to 36 in March 2025.  A PSMA PET on 07/19/2023 demonstrated residual osseous metastatic disease without lymph node involvement or visceral metastases.  Therefore, he has been kindly referred to us  today to discuss the potential role for Xofigo in the management of his progressive, osseous metastatic disease.  PREVIOUS RADIATION THERAPY: Yes  08/27/20 - 09/06/20:   The targets in the left supraclavicular node and prevascular mediastinal node were treated to 40 Gy in 5 fractions of 8 Gy.  Previous prostate radiation including LDR brachytherapy as evidenced on imaging.   PAST MEDICAL HISTORY:  Past Medical History:  Diagnosis Date   COPD (chronic obstructive pulmonary disease) (HCC)    Diabetes mellitus without complication (HCC)    Family history of ovarian cancer    Family history of prostate cancer    Hypertension    Prostate cancer (HCC)       PAST SURGICAL HISTORY: Past Surgical History:  Procedure Laterality Date   GOLD SEED IMPLANT      FAMILY HISTORY:  Family History  Problem Relation Age of Onset   Ovarian cancer Mother 83   Heart attack Father    Prostate cancer Maternal Uncle 76   Cancer Cousin        pat cousin with unknown cancer   Colon cancer Neg Hx    Pancreatic cancer Neg Hx    Breast cancer Neg Hx  SOCIAL HISTORY:  Social History   Socioeconomic History   Marital status: Widowed    Spouse name: Not on file   Number of children: Not on file   Years of education: Not on file   Highest education level: Not on file  Occupational History   Not on file  Tobacco Use   Smoking status: Former    Current packs/day: 0.00    Average packs/day: 0.3 packs/day for 60.0 years (15.0 ttl pk-yrs)    Types: Cigarettes    Start date: 06/25/1954    Quit date: 06/25/2014    Years since  quitting: 9.2   Smokeless tobacco: Never   Tobacco comments:    smoked since young age, quit in 2016  Substance and Sexual Activity   Alcohol use: Not Currently   Drug use: No   Sexual activity: Not Currently  Other Topics Concern   Not on file  Social History Narrative   Not on file   Social Drivers of Health   Financial Resource Strain: Low Risk  (08/08/2020)   Received from [facility]   Overall Financial Resource Strain (CARDIA)    Difficulty of Paying Living Expenses: Not hard at all  Food Insecurity: No Food Insecurity (09/30/2023)   Hunger Vital Sign    Worried About Running Out of Food in the Last Year: Never true    Ran Out of Food in the Last Year: Never true  Transportation Needs: No Transportation Needs (09/30/2023)   PRAPARE - Administrator, Civil Service (Medical): No    Lack of Transportation (Non-Medical): No  Physical Activity: Insufficiently Active (05/21/2020)   Exercise Vital Sign    Days of Exercise per Week: 2 days    Minutes of Exercise per Session: 20 min  Stress: No Stress Concern Present (05/21/2020)   [employer] of Occupational Health - Occupational Stress Questionnaire    Feeling of Stress : Not at all  Social Connections: Moderately Isolated (05/21/2020)   Social Connection and Isolation Panel [NHANES]    Frequency of Communication with Friends and Family: More than three times a week    Frequency of Social Gatherings with Friends and Family: More than three times a week    Attends Religious Services: 1 to 4 times per year    Active Member of [employer] or Organizations: No    Attends Banker Meetings: Never    Marital Status: Widowed  Intimate Partner Violence: Not At Risk (09/30/2023)   Humiliation, Afraid, Rape, and Kick questionnaire    Fear of Current or Ex-Partner: No    Emotionally Abused: No    Physically Abused: No    Sexually Abused: No    ALLERGIES: Lisinopril, Metoprolol, and  Semaglutide  MEDICATIONS:  Current Outpatient Medications  Medication Sig Dispense Refill   ACCU-CHEK GUIDE test strip      Accu-Chek Softclix Lancets lancets      acetaminophen  (TYLENOL ) 500 MG tablet Take 500 mg by mouth every 6 (six) hours as needed for moderate pain.     albuterol  (VENTOLIN  HFA) 108 (90 Base) MCG/ACT inhaler INHALE 2 PUFFS BY MOUTH EVERY 4 TO 6 HOURS AS NEEDED     amLODipine  (NORVASC ) 2.5 MG tablet Take 1 tablet (2.5 mg total) by mouth daily as needed. 30 tablet 0   aspirin 81 MG EC tablet Take 1 tablet by mouth daily.     atorvastatin  (LIPITOR) 10 MG tablet TAKE 1 TABLET BY MOUTH EVERY DAY (Patient taking  differently: Take 5 mg by mouth daily.) 90 tablet 3   azelastine (ASTELIN) 0.1 % nasal spray USE 2 SPRAYS IN EACH NOSTRIL TWICE A DAY FOR ALLERGIC RHINITIS     Blood Glucose Monitoring Suppl (ACCU-CHEK GUIDE) w/Device KIT      brompheniramine-pseudoephedrine-DM 30-2-10 MG/5ML syrup TAKE 5 ML BY MOUTH EVERY 4 HOURS AS NEEDED FOR COUGH 120 mL 0   bumetanide  (BUMEX ) 1 MG tablet Take 1.5 tablets (1.5 mg total) by mouth daily. 45 tablet 6   CALCIUM PO Take 600 mg by mouth daily.      cetirizine  (ZYRTEC ) 10 MG tablet Take 1 tablet by mouth daily.     CHERATUSSIN AC 100-10 MG/5ML syrup Take 5 mLs by mouth daily as needed.   0   Cholecalciferol (VITAMIN D3) 50 MCG (2000 UT) capsule Take 2,000 Units by mouth daily.     dicyclomine  (BENTYL ) 20 MG tablet Take 1 tablet by mouth 3 (three) times daily as needed.     dorzolamide  (TRUSOPT ) 2 % ophthalmic solution Place 1 drop into both eyes 3 (three) times daily.     erythromycin ophthalmic ointment 3 (three) times daily.     ferrous sulfate 325 (65 FE) MG tablet Take 1 tablet by mouth daily.     fexofenadine (ALLEGRA) 180 MG tablet Take 1 tablet every day by oral route at bedtime for 90 days.     fluticasone  (FLONASE ) 50 MCG/ACT nasal spray USE 2 SPRAY(S) IN EACH NOSTRIL ONCE DAILY FOR 30 DAYS 15.8 mL 12   ibuprofen  (ADVIL ,MOTRIN )  600 MG tablet Take 600 mg by mouth every 6 (six) hours as needed.   0   ipratropium (ATROVENT ) 0.03 % nasal spray USE 2 SPRAYS IN EACH NOSTRIL TWICE DAILY AS NEEDED     Ipratropium-Albuterol  (COMBIVENT) 20-100 MCG/ACT AERS respimat INHALE 1 PUFF BY MOUTH FOUR TIMES A DAY AS NEEDED COPD     latanoprost  (XALATAN ) 0.005 % ophthalmic solution INSTILL 1 DROP INTO AFFECTED EYE(S) BY OPHTHALMIC ROUTE ONCE DAILY INTHE EVENING     Latanoprostene Bunod  (VYZULTA ) 0.024 % SOLN Apply to eye.     Leuprolide  Acetate, 4 Month, (ELIGARD ) 30 MG injection 30 MG SUBCUTANEOUSLY Q90D PRN     losartan  (COZAAR ) 100 MG tablet Take 1 tablet by mouth daily.     magnesium  oxide (MAG-OX) 400 (240 Mg) MG tablet TAKE 1 TABLET BY MOUTH TWICE DAILY 180 tablet 3   magnesium  oxide (MAG-OX) 400 MG tablet Take 1 tablet (400 mg total) by mouth daily. (Patient taking differently: Take 400 mg by mouth 2 (two) times daily.) 90 tablet 2   metFORMIN  (GLUCOPHAGE ) 500 MG tablet Take 2 tablets (1,000 mg total) by mouth 2 (two) times daily with a meal. 120 tablet 3   montelukast (SINGULAIR) 10 MG tablet Take 10 mg by mouth at bedtime.     Multiple Vitamins-Minerals (PRESERVISION AREDS PO) 1 tablet daily.     potassium chloride  (MICRO-K ) 10 MEQ CR capsule TAKE 4 CAPSULE BY MOUTH DAILY WITH FOOD, OPEN AND SPRINKLE ON FOOD 348 capsule 2   prochlorperazine  (COMPAZINE ) 10 MG tablet Take 1 tablet (10 mg total) by mouth every 6 (six) hours as needed for nausea or vomiting. 60 tablet 3   sucralfate  (CARAFATE ) 1 g tablet Take 1 tablet by mouth 4 (four) times daily as needed.     tamsulosin  (FLOMAX ) 0.4 MG CAPS capsule Take 1 tablet by mouth daily.     TRELEGY ELLIPTA  100-62.5-25 MCG/INH AEPB 200 Micro grahams  triamcinolone cream (KENALOG) 0.1 % Apply topically 2 (two) times daily.     vitamin C (ASCORBIC ACID) 500 MG tablet Take 500 mg by mouth daily.     No current facility-administered medications for this encounter.   Facility-Administered  Medications Ordered in Other Encounters  Medication Dose Route Frequency Provider Last Rate Last Admin   denosumab  (XGEVA ) 120 MG/1.7ML injection            diphenhydrAMINE  (BENADRYL ) 50 MG/ML injection            famotidine  (PEPCID ) 20-0.9 MG/50ML-% IVPB             REVIEW OF SYSTEMS:  On review of systems, the patient reports that he is doing well overall.  He denies any chest pain, shortness of breath, cough, fevers, chills, night sweats, unintended weight changes.  He denies any bowel or bladder disturbances, and denies abdominal pain, nausea or vomiting.  He denies any new musculoskeletal or joint aches or pains. A complete review of systems is obtained and is otherwise negative.    PHYSICAL EXAM:  Wt Readings from Last 3 Encounters:  09/30/23 220 lb (99.8 kg)  09/08/23 227 lb (103 kg)  07/28/23 240 lb 1.6 oz (108.9 kg)   Temp Readings from Last 3 Encounters:  09/30/23 98 F (36.7 C) (Oral)  09/08/23 97.7 F (36.5 C) (Tympanic)  07/28/23 (!) 97.1 F (36.2 C) (Tympanic)   BP Readings from Last 3 Encounters:  09/30/23 127/78  09/08/23 131/77  07/28/23 (!) 164/79   Pulse Readings from Last 3 Encounters:  09/30/23 81  09/08/23 76  07/28/23 84   Pain Assessment Pain Score: 0-No pain/10  In general this is a well appearing African-American man in no acute distress.  He's alert and oriented x4 and appropriate throughout the examination. Cardiopulmonary assessment is negative for acute distress and he exhibits normal effort.   KPS = 80  100 - Normal; no complaints; no evidence of disease. 90   - Able to carry on normal activity; minor signs or symptoms of disease. 80   - Normal activity with effort; some signs or symptoms of disease. 59   - Cares for self; unable to carry on normal activity or to do active work. 60   - Requires occasional assistance, but is able to care for most of his personal needs. 50   - Requires considerable assistance and frequent medical care. 40    - Disabled; requires special care and assistance. 30   - Severely disabled; hospital admission is indicated although death not imminent. 20   - Very sick; hospital admission necessary; active supportive treatment necessary. 10   - Moribund; fatal processes progressing rapidly. 0     - Dead  Karnofsky DA, Abelmann WH, Craver LS and Burchenal [facility] [phone]) The use of the nitrogen mustards in the palliative treatment of carcinoma: with particular reference to bronchogenic carcinoma Cancer 1 634-56  LABORATORY DATA:  Lab Results  Component Value Date   WBC 8.1 09/08/2023   HGB 10.8 (L) 09/08/2023   HCT 34.6 (L) 09/08/2023   MCV 87.8 09/08/2023   PLT 307 09/08/2023   Lab Results  Component Value Date   NA 140 09/08/2023   K 4.0 09/08/2023   CL 99 09/08/2023   CO2 27 09/08/2023   Lab Results  Component Value Date   ALT 9 09/08/2023   AST 17 09/08/2023   ALKPHOS 42 09/08/2023   BILITOT 0.6 09/08/2023     RADIOGRAPHY: No results  found.    IMPRESSION/PLAN: 1. 83 y.o. 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 We discussed the natural history of metastatic castrate resistant prostate cancer and general treatment, highlighting the role of Xogifo infusions in the management. We focused on the details of logistics and delivery. The recommendation is to proceed with monthly infusions of Xofigo x6. We will monitor labs prior to each infusion to ensure it is safe to proceed with each treatment. We reviewed the anticipated acute and late sequelae associated with Xofigo in this setting. The patient was encouraged to ask questions that were answered to his/her satisfaction.  At the end of our conversation, the patient elects to proceed with Xofigo infusions. We will share this information with Dr. Katragadda and proceed with treatment planning accordingly in  anticipation of beginning treatment in the near future.  We enjoyed meeting with him again today and look forward to continuing to participate in his care.   We personally spent 60 minutes in this encounter including chart review, reviewing radiological studies, meeting face-to-face with the patient, entering orders and completing documentation.    Arta Bihari, PA-C    [name], MD  [facility]  Radiation Oncology Direct Dial: [phone]  Fax: [phone] [email or web address]  Skype  LinkedIn

## 2023-10-01 ENCOUNTER — Telehealth: Payer: Self-pay | Admitting: *Deleted

## 2023-10-01 NOTE — Telephone Encounter (Signed)
 CALLED PATIENT TO UPDATE, SPOKE WITH PATIENT'S DAUGHTER- STEPHANIE

## 2023-10-04 ENCOUNTER — Telehealth: Payer: Self-pay | Admitting: *Deleted

## 2023-10-04 NOTE — Telephone Encounter (Signed)
 CALLED PATIENT'S DAUGHTER-STEPHANIE TO INFORM THAT THE PERSON IN RADIOLOGY THAT SCHEDULES XOFIGO WILL BE OFF ALL WEEK, I WILL CALL HER AGAIN ON 10-11-23, SPOKE WITH DAUGHTER- STEPHANIE AND SHE IS AWARE OF THIS

## 2023-10-11 ENCOUNTER — Telehealth: Payer: Self-pay | Admitting: *Deleted

## 2023-10-11 ENCOUNTER — Other Ambulatory Visit: Payer: Self-pay

## 2023-10-11 DIAGNOSIS — C61 Malignant neoplasm of prostate: Secondary | ICD-10-CM

## 2023-10-11 NOTE — Telephone Encounter (Signed)
 Returned patient's daughter's phone call Trevor Fudge), spoke with Trevor Fudge

## 2023-10-11 NOTE — Telephone Encounter (Signed)
 CALLED PATIENT TO INFORM OF LAB AND WEIGHT APPT. FOR 10-22-23- ARRIVAL TIME- 11:45 AM @ [facility] AND HIS XOFIGO  INJ. ON 10-29-23 - ARRIVAL TIME- 11:45 AM @ [facility], SPOKE WITH PATIENT'S DAUGHTER- STEPHANIE AND SHE IS AWARE OF THESE APPTS. AND THE INSTRUCTIONS

## 2023-10-12 ENCOUNTER — Other Ambulatory Visit: Payer: Self-pay

## 2023-10-21 ENCOUNTER — Telehealth: Payer: Self-pay | Admitting: *Deleted

## 2023-10-21 NOTE — Telephone Encounter (Signed)
 Called patient to remind of lab and weight for 10-22-23 @ 12 pm, spoke with patient's daughterTrevor [name] and she is aware of these appts.

## 2023-10-22 ENCOUNTER — Ambulatory Visit
Admission: RE | Admit: 2023-10-22 | Discharge: 2023-10-22 | Disposition: A | Discharge status: Home / Self Care | Source: Ambulatory Visit | Attending: Radiation Oncology | Admitting: Radiation Oncology

## 2023-10-22 DIAGNOSIS — C61 Malignant neoplasm of prostate: Secondary | ICD-10-CM | POA: Diagnosis present

## 2023-10-22 DIAGNOSIS — C771 Secondary and unspecified malignant neoplasm of intrathoracic lymph nodes: Secondary | ICD-10-CM | POA: Diagnosis not present

## 2023-10-22 DIAGNOSIS — Z192 Hormone resistant malignancy status: Secondary | ICD-10-CM | POA: Insufficient documentation

## 2023-10-22 LAB — CBC WITH DIFFERENTIAL/PLATELET
Abs Immature Granulocytes: 0.04 10*3/uL (ref 0.00–0.07)
Basophils Absolute: 0.1 10*3/uL (ref 0.0–0.1)
Basophils Relative: 1 %
Eosinophils Absolute: 0.2 10*3/uL (ref 0.0–0.5)
Eosinophils Relative: 1 %
HCT: 33.9 % — ABNORMAL LOW (ref 39.0–52.0)
Hemoglobin: 11 g/dL — ABNORMAL LOW (ref 13.0–17.0)
Immature Granulocytes: 0 %
Lymphocytes Relative: 4 %
Lymphs Abs: 0.6 10*3/uL — ABNORMAL LOW (ref 0.7–4.0)
MCH: 27.1 pg (ref 26.0–34.0)
MCHC: 32.4 g/dL (ref 30.0–36.0)
MCV: 83.5 fL (ref 80.0–100.0)
Monocytes Absolute: 1.2 10*3/uL — ABNORMAL HIGH (ref 0.1–1.0)
Monocytes Relative: 9 %
Neutro Abs: 11.9 10*3/uL — ABNORMAL HIGH (ref 1.7–7.7)
Neutrophils Relative %: 85 %
Platelets: 325 10*3/uL (ref 150–400)
RBC: 4.06 MIL/uL — ABNORMAL LOW (ref 4.22–5.81)
RDW: 14.3 % (ref 11.5–15.5)
WBC: 14 10*3/uL — ABNORMAL HIGH (ref 4.0–10.5)
nRBC: 0 % (ref 0.0–0.2)

## 2023-10-22 LAB — COMPREHENSIVE METABOLIC PANEL WITH GFR
ALT: 6 U/L (ref 0–44)
AST: 14 U/L — ABNORMAL LOW (ref 15–41)
Albumin: 4 g/dL (ref 3.5–5.0)
Alkaline Phosphatase: 76 U/L (ref 38–126)
Anion gap: 7 (ref 5–15)
BUN: 13 mg/dL (ref 8–23)
CO2: 32 mmol/L (ref 22–32)
Calcium: 9.6 mg/dL (ref 8.9–10.3)
Chloride: 101 mmol/L (ref 98–111)
Creatinine, Ser: 1.01 mg/dL (ref 0.61–1.24)
GFR, Estimated: >60 mL/min (ref >60)
Glucose, Bld: 140 mg/dL — ABNORMAL HIGH (ref 70–99)
Potassium: 4.2 mmol/L (ref 3.5–5.1)
Sodium: 140 mmol/L (ref 135–145)
Total Bilirubin: 0.9 mg/dL (ref 0.0–1.2)
Total Protein: 6.9 g/dL (ref 6.5–8.1)

## 2023-10-23 LAB — PROSTATE-SPECIFIC AG, SERUM (LABCORP): Prostate Specific Ag, Serum: 325 ng/mL — ABNORMAL HIGH (ref 0.0–4.0)

## 2023-10-26 ENCOUNTER — Inpatient Hospital Stay: Payer: No Typology Code available for payment source

## 2023-10-26 ENCOUNTER — Inpatient Hospital Stay: Payer: No Typology Code available for payment source | Attending: Hematology

## 2023-10-26 VITALS — BP 126/83 | HR 88 | Temp 96.8°F | Resp 20

## 2023-10-26 DIAGNOSIS — M8580 Other specified disorders of bone density and structure, unspecified site: Secondary | ICD-10-CM | POA: Diagnosis not present

## 2023-10-26 DIAGNOSIS — Z79899 Other long term (current) drug therapy: Secondary | ICD-10-CM | POA: Insufficient documentation

## 2023-10-26 DIAGNOSIS — C61 Malignant neoplasm of prostate: Secondary | ICD-10-CM

## 2023-10-26 DIAGNOSIS — Z7982 Long term (current) use of aspirin: Secondary | ICD-10-CM | POA: Insufficient documentation

## 2023-10-26 DIAGNOSIS — C787 Secondary malignant neoplasm of liver and intrahepatic bile duct: Secondary | ICD-10-CM | POA: Diagnosis not present

## 2023-10-26 DIAGNOSIS — Z5111 Encounter for antineoplastic chemotherapy: Secondary | ICD-10-CM | POA: Diagnosis present

## 2023-10-26 DIAGNOSIS — Z5189 Encounter for other specified aftercare: Secondary | ICD-10-CM | POA: Diagnosis not present

## 2023-10-26 DIAGNOSIS — C778 Secondary and unspecified malignant neoplasm of lymph nodes of multiple regions: Secondary | ICD-10-CM | POA: Insufficient documentation

## 2023-10-26 DIAGNOSIS — C781 Secondary malignant neoplasm of mediastinum: Secondary | ICD-10-CM | POA: Insufficient documentation

## 2023-10-26 DIAGNOSIS — Z87891 Personal history of nicotine dependence: Secondary | ICD-10-CM | POA: Diagnosis not present

## 2023-10-26 DIAGNOSIS — C7951 Secondary malignant neoplasm of bone: Secondary | ICD-10-CM | POA: Insufficient documentation

## 2023-10-26 DIAGNOSIS — Z95828 Presence of other vascular implants and grafts: Secondary | ICD-10-CM

## 2023-10-26 MED ORDER — LEUPROLIDE ACETATE (6 MONTH) 45 MG ~~LOC~~ KIT
45.0000 mg | PACK | Freq: Once | SUBCUTANEOUS | Status: AC
  Administered 2023-10-26: 45 mg via SUBCUTANEOUS
  Filled 2023-10-26: qty 45

## 2023-10-26 MED ORDER — DENOSUMAB 120 MG/1.7ML ~~LOC~~ SOLN
120.0000 mg | Freq: Once | SUBCUTANEOUS | Status: AC
  Administered 2023-10-26: 120 mg via SUBCUTANEOUS
  Filled 2023-10-26: qty 1.7

## 2023-10-26 MED ORDER — HEPARIN SOD (PORK) LOCK FLUSH 100 UNIT/ML IV SOLN
500.0000 [IU] | Freq: Once | INTRAVENOUS | Status: AC
  Administered 2023-10-26: 500 [IU] via INTRAVENOUS

## 2023-10-26 MED ORDER — SODIUM CHLORIDE FLUSH 0.9 % IV SOLN
10.0000 mL | Freq: Once | INTRAVENOUS | Status: AC
  Administered 2023-10-26: 10 mL via INTRAVENOUS
  Filled 2023-10-26: qty 10

## 2023-10-26 NOTE — Patient Instructions (Signed)
 [facility] - A DEPT OF [facility]  Discharge Instructions: Thank you for choosing [facility] to provide your oncology and hematology care.  If you have a lab appointment with the Cancer Center - please note that after April 8th, 2024, all labs will be drawn in the cancer center.  You do not have to check in or register with the main entrance as you have in the past but will complete your check-in in the cancer center.  Wear comfortable clothing and clothing appropriate for easy access to any Portacath or PICC line.   We strive to give you quality time with your provider. You may need to reschedule your appointment if you arrive late (15 or more minutes).  Arriving late affects you and other patients whose appointments are after yours.  Also, if you miss three or more appointments without notifying the office, you may be dismissed from the clinic at the provider's discretion.      For prescription refill requests, have your pharmacy contact our office and allow 72 hours for refills to be completed.    Today you received the following Xgeva  and eligard , return as scheduled.  To help prevent nausea and vomiting after your treatment, we encourage you to take your nausea medication as directed.  BELOW ARE SYMPTOMS THAT SHOULD BE REPORTED IMMEDIATELY: *FEVER GREATER THAN 100.4 F (38 C) OR HIGHER *CHILLS OR SWEATING *NAUSEA AND VOMITING THAT IS NOT CONTROLLED WITH YOUR NAUSEA MEDICATION *UNUSUAL SHORTNESS OF BREATH *UNUSUAL BRUISING OR BLEEDING *URINARY PROBLEMS (pain or burning when urinating, or frequent urination) *BOWEL PROBLEMS (unusual diarrhea, constipation, pain near the anus) TENDERNESS IN MOUTH AND THROAT WITH OR WITHOUT PRESENCE OF ULCERS (sore throat, sores in mouth, or a toothache) UNUSUAL RASH, SWELLING OR PAIN  UNUSUAL VAGINAL DISCHARGE OR ITCHING   Items with * indicate a potential emergency and should be followed up as soon as  possible or go to the Emergency Department if any problems should occur.  Please show the CHEMOTHERAPY ALERT CARD or IMMUNOTHERAPY ALERT CARD at check-in to the Emergency Department and triage nurse.  Should you have questions after your visit or need to cancel or reschedule your appointment, please contact [facility] [place] - A DEPT OF [facility] [phone]  and follow the prompts.  Office hours are 8:00 a.m. to 4:30 p.m. Monday - Friday. Please note that voicemails left after 4:00 p.m. may not be returned until the following business day.  We are closed weekends and major holidays. You have access to a nurse at all times for urgent questions. Please call the main number to the clinic [phone] and follow the prompts.  For any non-urgent questions, you may also contact your provider using MyChart. We now offer e-Visits for anyone 10 and older to request care online for non-urgent symptoms. For details visit [email or web address].   Also download the MyChart app! Go to the app store, search "MyChart", open the app, select [facility], and log in with your MyChart username and password.

## 2023-10-26 NOTE — Progress Notes (Signed)
 Labs done on 10/22/23, continue Eligard  and xgeva  per Dr. Cheree Cords. Patient's PSA increased from last visit, Dr. Cheree Cords made aware.   Port flushed with good blood return noted. No bruising or swelling at site. Bandaid applied. Patient taking calcium as directed. Denied tooth, jaw, and leg pain. No recent or upcoming dental visits. Labs reviewed. Patient tolerated injection with no complaints voiced. See MAR for details. Patient stable during and after injection. Site clean and dry with no bruising or swelling noted. Band aid applied.  Patient tolerated Eligard  injection with no complaints voiced. Site clean and dry with no bruising or swelling noted at site. See MAR for details. Band aid applied.  Patient stable during and after injection. VSS with discharge and left in satisfactory condition with no s/s of distress noted.

## 2023-10-28 ENCOUNTER — Telehealth: Payer: Self-pay | Admitting: *Deleted

## 2023-10-28 NOTE — Telephone Encounter (Signed)
 CALLED PATIENT'S DAUGHTER- STEPHANIE Sternberg TO REMIND OF XOFIGO INJ. FOR 10-29-23- ARRIVAL TIME- 11:45 AM @ [facility], SPOKE WITH PATIENT'S DAUGHTER- STEPHANIE AND SHE IS AWARE OF THIS APPT.

## 2023-10-29 ENCOUNTER — Encounter (HOSPITAL_COMMUNITY)
Admission: RE | Admit: 2023-10-29 | Discharge: 2023-10-29 | Disposition: A | Discharge status: Home / Self Care | Source: Ambulatory Visit | Attending: Radiation Oncology | Admitting: Radiation Oncology

## 2023-10-29 DIAGNOSIS — C61 Malignant neoplasm of prostate: Secondary | ICD-10-CM | POA: Insufficient documentation

## 2023-10-29 DIAGNOSIS — C7951 Secondary malignant neoplasm of bone: Secondary | ICD-10-CM | POA: Insufficient documentation

## 2023-10-29 MED ORDER — RADIUM RA 223 DICHLORIDE 30 MCCI/ML IV SOLN
151.2600 | Freq: Once | INTRAVENOUS | Status: AC | PRN
  Administered 2023-10-29: 161.54 via INTRAVENOUS

## 2023-11-03 ENCOUNTER — Other Ambulatory Visit: Payer: Self-pay | Admitting: *Deleted

## 2023-11-03 MED ORDER — MEGESTROL ACETATE 400 MG/10ML PO SUSP: 400.0000 mg | Freq: Two times a day (BID) | ORAL | 0 refills | Status: DC

## 2023-11-03 NOTE — Telephone Encounter (Signed)
 Received call from daughter Trevor Fudge stating that patient has experienced that food is not tasting good to him, therefore he has lost his appetite.  Per Dr. Katragadda Megace 400 mg 10 ml bid sent to pharmacy.  Daughter verbalized understanding.

## 2023-11-04 ENCOUNTER — Ambulatory Visit (HOSPITAL_COMMUNITY)
Admission: RE | Admit: 2023-11-04 | Discharge: 2023-11-04 | Disposition: A | Discharge status: Home / Self Care | Source: Ambulatory Visit | Attending: Hematology | Admitting: Hematology

## 2023-11-04 DIAGNOSIS — C61 Malignant neoplasm of prostate: Secondary | ICD-10-CM | POA: Insufficient documentation

## 2023-11-04 DIAGNOSIS — C771 Secondary and unspecified malignant neoplasm of intrathoracic lymph nodes: Secondary | ICD-10-CM | POA: Insufficient documentation

## 2023-11-04 MED ORDER — FLOTUFOLASTAT F 18 GALLIUM 296-5846 MBQ/ML IV SOLN
8.4800 | Freq: Once | INTRAVENOUS | Status: AC
  Administered 2023-11-04: 8.48 via INTRAVENOUS
  Filled 2023-11-04: qty 9

## 2023-11-05 ENCOUNTER — Telehealth: Payer: Self-pay

## 2023-11-05 NOTE — Telephone Encounter (Signed)
 RN  returned call to Mr. Top daughter June Vacha) had question about side effect such as loss of taste while on Xofigo  treatments.  RN reached out to Xofigo  rep awaiting response.  When talking with Trevor Fudge she reports she had reached out to Dr. Cheree Cords who sent script in for Megace  which seems to be helping him per [facility].  This writer told her I would let her know if I find out anything more from the drug reps.  No other issues.

## 2023-11-09 NOTE — Progress Notes (Signed)
  Radiation Oncology         [phone] ________________________________  Name: Andre [name] MRN: [id number]  Date: 10/29/2023  DOB: 05-23-1941  Radium-223 Infusion Note  Diagnosis:  Castration resistant prostate cancer with painful bone involvement  Current Infusion:    1  Planned Infusions:  6  Narrative: [name] presented to nuclear medicine for treatment. His most recent blood counts were reviewed.  He remains a good candidate to proceed with Ra-223.  The patient was situated in an infusion suite with a contact barrier placed under his arm. Intravenous access was established, using sterile technique, and a normal saline infusion from a syringe was started.  Micro-dosimetry:  The prescribed radiation activity was assayed and confirmed to be within specified tolerance.   initial given  residual    Special Treatment Procedure - Infusion:  The nuclear medicine technologist and I personally verified the dose activity to be delivered as specified in the written directive, and verified the patient identification via 2 separate methods.  The syringe containing the dose was attached to an intravenous access and the dose delivered over a minute. No complications were noted.  The total administered dose was 165.6 microcuries.   A saline flush of the line and the syringe that contained the isotope was then performed.  The residual radioactivity in the syringe was 4.06 microcuries, so the actual infused isotope activity was 161.54 microcuries.   Pressure was applied to the venipuncture site, and a compression bandage placed.   Radiation Safety personnel were present to perform the discharge survey, as detailed on their documentation.   After a short period of observation, the patient had his IV removed.  Impression:  The patient tolerated his infusion relatively well.  Plan:  The patient will return in one month for ongoing care.    ________________________________  Trilby Fujisawa.  Lorri Rota, M.D.

## 2023-11-10 NOTE — Progress Notes (Signed)
 [facility] [address], [place] [address]    Clinic Day:  11/11/2023  Referring physician: Clinic, [name]  Patient Care Team: Clinic, [name] as PCP - General [name], MD as Medical Oncologist (Medical Oncology) [name], MD as Consulting Physician (Radiation Oncology)   ASSESSMENT & PLAN:   Assessment: 1.  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 -Abiraterone  and prednisone  from 07/13/2016 through 04/09/2019, discontinued secondary to PSA progression. -Enzalutamide  from 04/12/2019 through 11/03/2019 with progression. -Last Lupron  on 06/09/2019. -CT CAP on 11/10/2019 shows no findings of active malignancy.  Stable small sclerotic lesions at T4 and T10, nonspecific.  Right hydronephrosis with right proximal hydroureter extending down to a 0.8 x 0.8 x 0.4 cm proximal ureteral stone at L3 vertical level.  Nonobstructive right and possibly left nephrolithiasis. -Bone scan on 11/10/2019 shows right proximal tibial metaphyseal activity from recent trauma.  Degenerative findings in the spine, right sternoclavicular joint, shoulders and right foot.  No evidence of metastatic disease. -Foundation 1 test shows MS-stable.  AR amplification.  Sensitivity is reduced due to sample quality. -Guardant 360 on 12/12/2019 MSI high not detected.  TMB was not evaluable.  No other targetable mutations. -[id number]  PET scan showed intense radiotracer activity in the left supraclavicular and prevascular lymph nodes, measuring 10 mm.  Cluster of small prevascular lymph nodes measures 5-7 mm each with SUV 54.  AP window lymph node measuring 8 mm.  No activity in the prostate gland or abdominal or pelvic lymph nodes.  No skeletal activity. - SBRT to the left supraclavicular lymph node and prevascular mediastinal  lymph node from 08/27/2020 through 09/06/2020, 40 Gray in 5 fractions. - CT CAP (10/22/2021): Done at [facility]: No lymphadenopathy.  Cirrhosis.  Intrahepatic and extrahepatic biliary ductal dilatation. - Bone scan (10/28/2021): New focal abnormal tracer uptake at the inferior aspect of the right scapula. - PSMA PET scan (11/13/2021): Interval development of multifocal tracer avid bone metastasis, only 1 of these lesions is visible on the recent nuclear medicine bone scan dated 10/28/2021.  New tracer avid right axillary, subcarinal, left paratracheal, left retrocrural and upper abdominal lymph nodes. - 13 cycles of cabazitaxel  from 12/15/2021 through 09/08/2022 with progression - Pluvicto  cycle 1 on 11/16/2022, cycle 2 on 01/12/2023, cycle 3 on 02/09/2023, cycle 4 on 03/23/2023, cycle 5 on 05/04/2023 and cycle 6 on 06/15/2023.   2.  Androgen deprivation therapy induced bone loss: -Received Zometa  every 3 months for a year completed on 10/29/2017.   3.  Genetic testing: -Germline mutation testing was negative.    Plan: 1.  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  Eligard  45 mg on 10/26/2023. - PSMA PET scan (07/18/2020): Interval improvement in skeletal metastatic disease with decrease in activity with no evidence of nodal or visceral metastatic disease. - However his PSA continued to go up.  Last PSA on 09/08/2023 with increased to 122.  I have referred him to Dr. Lorri Rota for radium-223. - He received first cycle of radium-223 on 10/29/2023. - As his most recent PSA increased to 325 on 10/22/2023, I have done a PSMA PET scan. - PSMA PET scan (11/04/2023): Rapid progression of multifocal bone metastasis, nodal metastasis in the mediastinum and upper abdomen, 2 new hepatic lesions. - I have discussed switching his therapy.  We will discontinue Xofigo  at this time due to visceral mets. - I will reach out to tertiary  centers to see if they have any clinical trials.  Other options include docetaxel which she received in 2018 but did not have progression on it.  Other options include mitoxantrone, carboplatin + cabazitaxel .  Rechallenge with Pluvicto  is not certain as he progressed within 6 months after last dose. - Labs today: LFTs are normal with albumin 3.0.  CBC grossly normal.   2.  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  Continue denosumab  every 6 weeks.   3.  Leg swelling: - Continue Bumex  daily.  Mild swelling is stable.   4.  Peripheral neuropathy: - Numbness in the feet is stable.  5.  Hypomagnesemia: - Continue magnesium  twice daily.  Last magnesium  was normal.    Orders Placed This Encounter  Procedures   CBC with Differential   Comprehensive metabolic panel      I,Helena R Teague,acting as a scribe for [name], MD.,have documented all relevant documentation on the behalf of [name], MD,as directed by  [name], MD while in the presence of [name], MD.  I, [name] MD, have reviewed the above documentation for accuracy and completeness, and I agree with the above.    [name], MD   6/19/20255:51 PM  CHIEF COMPLAINT:   Diagnosis: metastatic prostate cancer    Cancer Staging  No matching staging information was found for the patient.    Prior Therapy: 1. Docetaxel x 14 cycles from 08/11/2015 through 06/12/2016. 2. Abiraterone  from 07/13/2016 through 04/09/2019. 3. Enzalutamide  from 04/12/2019 to 11/03/2019. 4. SBRT to nodal mets, 08/27/20 - 09/06/20 5. ADT (Lupron /Eligard ) through 08/06/21 6. Cabazitaxel , 13 cycles, 12/15/21 - 09/08/22 7. Pluvicto , 6 cycles-- 11/16/22, 01/12/23, 02/09/23, 03/23/23, 05/04/23, and 06/15/23  Current Therapy: Observation   HISTORY OF PRESENT ILLNESS:   Oncology History  Prostate cancer metastatic to intrathoracic lymph node (HCC)  07/24/2015  Initial Diagnosis   Prostate cancer metastatic to the left supraclavicular and anterior mediastinal lymph nodes   08/21/2015 - 06/12/2016 Chemotherapy   Docetaxel every 3 weeks for 14 cycles, discontinued as PSA has plateaued around 11    07/13/2016 Treatment Plan Change   Zytiga  1000 milligrams daily along with prednisone  5 mg daily, started secondary to fluid overload from docetaxel   10/14/2018 Genetic Testing   Negative genetic testing.  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  The Common Hereditary Gene Panel offered by [facility] includes sequencing and/or deletion duplication testing of the following 48 genes: APC, ATM, AXIN2, BARD1, BMPR1A, BRCA1, BRCA2, BRIP1, CDH1, CDK4, CDKN2A (p14ARF), CDKN2A (p16INK4a), CHEK2, CTNNA1, DICER1, EPCAM (Deletion/duplication testing only), GREM1 (promoter region deletion/duplication testing only), KIT, MEN1, MLH1, MSH2, MSH3, MSH6, MUTYH, NBN, NF1, NHTL1, PALB2, PDGFRA, PMS2, POLD1, POLE, PTEN, RAD50, RAD51C, RAD51D, RNF43, SDHB, SDHC, SDHD, SMAD4, SMARCA4. STK11, TP53, TSC1, TSC2, and VHL.  The following genes were evaluated for sequence changes only: SDHA and HOXB13 c.251G>A variant only. The report date is Oct 14, 2018.   09/18/2019 Genetic Testing   Foundation One CDx      12/12/2019 Genetic Testing   Guardant 360       12/15/2021 - 01/06/2022 Chemotherapy   Patient is on Treatment Plan : PROSTATE Cabazitaxel  + Prednisone  q21d     12/15/2021 - 09/08/2022 Chemotherapy   Patient is on Treatment Plan : PROSTATE Cabazitaxel  (20) D1 + Prednisone  D1-21 q21d  INTERVAL HISTORY:   Andre [name] is a 83 y.o. male presenting to clinic today for follow up of metastatic prostate cancer. He was last seen by me on 09/08/23.  Since his last visit, he underwent PET PSMA on 11/04/23 that found: Unfortunately, there has been rapid progression of multifocal prostate cancer metastasis. New nodal metastasis in the mediastinum. New nodal metastasis in the upper abdomen  along the aorta and porta hepatis. New hepatic metastasis. Marked progression of skeletal metastasis.  Today, he states that he is doing well overall. His appetite level is at 75%. His energy level is at 55%.  PAST MEDICAL HISTORY:   Past Medical History: Past Medical History:  Diagnosis Date   COPD (chronic obstructive pulmonary disease) (HCC)    Diabetes mellitus without complication (HCC)    Family history of ovarian cancer    Family history of prostate cancer    Hypertension    Prostate cancer [facility])     Surgical History: Past Surgical History:  Procedure Laterality Date   GOLD SEED IMPLANT      Social History: Social History   Socioeconomic History   Marital status: Widowed    Spouse name: Not on file   Number of children: Not on file   Years of education: Not on file   Highest education level: Not on file  Occupational History   Not on file  Tobacco Use   Smoking status: Former    Current packs/day: 0.00    Average packs/day: 0.3 packs/day for 60.0 years (15.0 ttl pk-yrs)    Types: Cigarettes    Start date: 06/25/1954    Quit date: 06/25/2014    Years since quitting: 9.3   Smokeless tobacco: Never   Tobacco comments:    smoked since young age, quit in 2016  Substance and Sexual Activity   Alcohol use: Not Currently   Drug use: No   Sexual activity: Not Currently  Other Topics Concern   Not on file  Social History Narrative   Not on file   Social Drivers of Health   Financial Resource Strain: Low Risk  (08/08/2020)   [facility] from [facility]   Overall Financial Resource Strain (CARDIA)    Difficulty of Paying Living Expenses: Not hard at all  Food Insecurity: No Food Insecurity (09/30/2023)   Hunger Vital Sign    Worried About Running Out of Food in the Last Year: Never true    Ran Out of Food in the Last Year: Never true  Transportation Needs: No Transportation Needs (09/30/2023)   PRAPARE - Administrator, Civil Service (Medical): No     Lack of Transportation (Non-Medical): No  Physical Activity: Insufficiently Active (05/21/2020)   Exercise Vital Sign    Days of Exercise per Week: 2 days    Minutes of Exercise per Session: 20 min  Stress: No Stress Concern Present (05/21/2020)   [employer] of Occupational Health - Occupational Stress Questionnaire    Feeling of Stress : Not at all  Social Connections: Moderately Isolated (05/21/2020)   Social Connection and Isolation Panel    Frequency of Communication with Friends and Family: More than three times a week    Frequency of Social Gatherings with Friends and Family: More than three times a week    Attends Religious Services: 1 to 4 times per year    Active Member of [employer] or Organizations: No    Attends Banker Meetings: Never    Marital Status: Widowed  Intimate  Partner Violence: Not At Risk (09/30/2023)   Humiliation, Afraid, Rape, and Kick questionnaire    Fear of Current or Ex-Partner: No    Emotionally Abused: No    Physically Abused: No    Sexually Abused: No    Family History: Family History  Problem Relation Age of Onset   Ovarian cancer Mother 56   Heart attack Father    Prostate cancer Maternal Uncle 66   Cancer Cousin        pat cousin with unknown cancer   Colon cancer Neg Hx    Pancreatic cancer Neg Hx    Breast cancer Neg Hx     Current Medications:  Current Outpatient Medications:    ACCU-CHEK GUIDE test strip, , Disp: , Rfl:    Accu-Chek Softclix Lancets lancets, , Disp: , Rfl:    acetaminophen  (TYLENOL ) 500 MG tablet, Take 500 mg by mouth every 6 (six) hours as needed for moderate pain., Disp: , Rfl:    albuterol  (VENTOLIN  HFA) 108 (90 Base) MCG/ACT inhaler, INHALE 2 PUFFS BY MOUTH EVERY 4 TO 6 HOURS AS NEEDED, Disp: , Rfl:    amLODipine  (NORVASC ) 2.5 MG tablet, Take 1 tablet (2.5 mg total) by mouth daily as needed., Disp: 30 tablet, Rfl: 0   aspirin 81 MG EC tablet, Take 1 tablet by mouth daily., Disp: , Rfl:     atorvastatin  (LIPITOR) 10 MG tablet, TAKE 1 TABLET BY MOUTH EVERY DAY, Disp: 90 tablet, Rfl: 3   azelastine (ASTELIN) 0.1 % nasal spray, USE 2 SPRAYS IN EACH NOSTRIL TWICE A DAY FOR ALLERGIC RHINITIS, Disp: , Rfl:    Blood Glucose Monitoring Suppl (ACCU-CHEK GUIDE) w/Device KIT, , Disp: , Rfl:    brompheniramine-pseudoephedrine-DM 30-2-10 MG/5ML syrup, TAKE 5 ML BY MOUTH EVERY 4 HOURS AS NEEDED FOR COUGH, Disp: 120 mL, Rfl: 0   bumetanide  (BUMEX ) 1 MG tablet, Take 1.5 tablets (1.5 mg total) by mouth daily., Disp: 45 tablet, Rfl: 6   CALCIUM PO, Take 600 mg by mouth daily. , Disp: , Rfl:    cetirizine  (ZYRTEC ) 10 MG tablet, Take 1 tablet by mouth daily., Disp: , Rfl:    CHERATUSSIN AC 100-10 MG/5ML syrup, Take 5 mLs by mouth daily as needed. , Disp: , Rfl: 0   Cholecalciferol (VITAMIN D3) 50 MCG (2000 UT) capsule, Take 2,000 Units by mouth daily., Disp: , Rfl:    dicyclomine  (BENTYL ) 20 MG tablet, Take 1 tablet by mouth 3 (three) times daily as needed., Disp: , Rfl:    dorzolamide  (TRUSOPT ) 2 % ophthalmic solution, Place 1 drop into both eyes 3 (three) times daily., Disp: , Rfl:    erythromycin ophthalmic ointment, 3 (three) times daily., Disp: , Rfl:    ferrous sulfate 325 (65 FE) MG tablet, Take 1 tablet by mouth daily., Disp: , Rfl:    fexofenadine (ALLEGRA) 180 MG tablet, Take 1 tablet every day by oral route at bedtime for 90 days., Disp: , Rfl:    fluticasone  (FLONASE ) 50 MCG/ACT nasal spray, USE 2 SPRAY(S) IN EACH NOSTRIL ONCE DAILY FOR 30 DAYS, Disp: 15.8 mL, Rfl: 12   ibuprofen  (ADVIL ,MOTRIN ) 600 MG tablet, Take 600 mg by mouth every 6 (six) hours as needed. , Disp: , Rfl: 0   ipratropium (ATROVENT ) 0.03 % nasal spray, USE 2 SPRAYS IN EACH NOSTRIL TWICE DAILY AS NEEDED, Disp: , Rfl:    Ipratropium-Albuterol  (COMBIVENT) 20-100 MCG/ACT AERS respimat, INHALE 1 PUFF BY MOUTH FOUR TIMES A DAY AS NEEDED COPD, Disp: , Rfl:  latanoprost  (XALATAN ) 0.005 % ophthalmic solution, INSTILL 1 DROP INTO  AFFECTED EYE(S) BY OPHTHALMIC ROUTE ONCE DAILY INTHE EVENING, Disp: , Rfl:    Latanoprostene Bunod  (VYZULTA ) 0.024 % SOLN, Apply to eye., Disp: , Rfl:    Leuprolide  Acetate, 4 Month, (ELIGARD ) 30 MG injection, 30 MG SUBCUTANEOUSLY Q90D PRN, Disp: , Rfl:    losartan  (COZAAR ) 100 MG tablet, Take 1 tablet by mouth daily., Disp: , Rfl:    magnesium  oxide (MAG-OX) 400 (240 Mg) MG tablet, TAKE 1 TABLET BY MOUTH TWICE DAILY, Disp: 180 tablet, Rfl: 3   magnesium  oxide (MAG-OX) 400 MG tablet, Take 1 tablet (400 mg total) by mouth daily. (Patient taking differently: Take 400 mg by mouth 2 (two) times daily.), Disp: 90 tablet, Rfl: 2   megestrol  (MEGACE ) 400 MG/10ML suspension, Take 10 mLs (400 mg total) by mouth 2 (two) times daily., Disp: 480 mL, Rfl: 0   metFORMIN  (GLUCOPHAGE ) 500 MG tablet, Take 2 tablets (1,000 mg total) by mouth 2 (two) times daily with a meal., Disp: 120 tablet, Rfl: 3   montelukast (SINGULAIR) 10 MG tablet, Take 10 mg by mouth at bedtime., Disp: , Rfl:    Multiple Vitamins-Minerals (PRESERVISION AREDS PO), 1 tablet daily., Disp: , Rfl:    potassium chloride  (MICRO-K ) 10 MEQ CR capsule, TAKE 4 CAPSULE BY MOUTH DAILY WITH FOOD, OPEN AND SPRINKLE ON FOOD, Disp: 348 capsule, Rfl: 2   prochlorperazine  (COMPAZINE ) 10 MG tablet, Take 1 tablet (10 mg total) by mouth every 6 (six) hours as needed for nausea or vomiting., Disp: 60 tablet, Rfl: 3   sucralfate  (CARAFATE ) 1 g tablet, Take 1 tablet by mouth 4 (four) times daily as needed., Disp: , Rfl:    tamsulosin  (FLOMAX ) 0.4 MG CAPS capsule, Take 1 tablet by mouth daily., Disp: , Rfl:    TRELEGY ELLIPTA  200-62.5-25 MCG/ACT AEPB, Take 1 puff by mouth daily., Disp: , Rfl:    triamcinolone cream (KENALOG) 0.1 %, Apply topically 2 (two) times daily., Disp: , Rfl:    vitamin C (ASCORBIC ACID) 500 MG tablet, Take 500 mg by mouth daily., Disp: , Rfl:  No current facility-administered medications for this visit.  Facility-Administered Medications  Ordered in Other Visits:    denosumab  (XGEVA ) 120 MG/1.7ML injection, , , ,    diphenhydrAMINE  (BENADRYL ) 50 MG/ML injection, , , ,    famotidine  (PEPCID ) 20-0.9 MG/50ML-% IVPB, , , ,    Allergies: Allergies  Allergen Reactions   Lisinopril Cough   Metoprolol Other (See Comments) and Cough    Increases frequency of cough Other reaction(s): Other (See Comments) Increases frequency of cough   Semaglutide Nausea And Vomiting    REVIEW OF SYSTEMS:   Review of Systems  Constitutional:  Negative for chills, fatigue and fever.  HENT:   Negative for lump/mass, mouth sores, nosebleeds, sore throat and trouble swallowing.   Eyes:  Negative for eye problems.  Respiratory:  Positive for shortness of breath. Negative for cough.   Cardiovascular:  Negative for chest pain, leg swelling and palpitations.  Gastrointestinal:  Positive for constipation and diarrhea. Negative for abdominal pain, nausea and vomiting.  Genitourinary:  Negative for bladder incontinence, difficulty urinating, dysuria, frequency, hematuria and nocturia.   Musculoskeletal:  Negative for arthralgias, back pain, flank pain, myalgias and neck pain.  Skin:  Negative for itching and rash.  Neurological:  Positive for numbness. Negative for dizziness and headaches.  Hematological:  Does not bruise/bleed easily.  Psychiatric/Behavioral:  Negative for depression, sleep disturbance and suicidal  ideas. The patient is not nervous/anxious.   All other systems reviewed and are negative.    VITALS:   Blood pressure 119/67, pulse 73, temperature 98.9 F (37.2 C), temperature source Tympanic, resp. rate 18, height 5' 9 (1.753 m), weight 221 lb (100.2 kg), SpO2 99%.  Wt Readings from Last 3 Encounters:  11/11/23 221 lb (100.2 kg)  09/30/23 220 lb (99.8 kg)  09/08/23 227 lb (103 kg)    Body mass index is 32.64 kg/m.  Performance status (ECOG): 1 - Symptomatic but completely ambulatory  PHYSICAL EXAM:   Physical Exam Vitals  and nursing note reviewed. Exam conducted with a chaperone present.  Constitutional:      Appearance: Normal appearance.   Cardiovascular:     Rate and Rhythm: Normal rate and regular rhythm.     Pulses: Normal pulses.     Heart sounds: Normal heart sounds.  Pulmonary:     Effort: Pulmonary effort is normal.     Breath sounds: Normal breath sounds.  Abdominal:     Palpations: Abdomen is soft. There is no hepatomegaly, splenomegaly or mass.     Tenderness: There is no abdominal tenderness.   Musculoskeletal:     Right lower leg: No edema.     Left lower leg: No edema.  Lymphadenopathy:     Cervical: No cervical adenopathy.     Right cervical: No superficial, deep or posterior cervical adenopathy.    Left cervical: No superficial, deep or posterior cervical adenopathy.     Upper Body:     Right upper body: No supraclavicular or axillary adenopathy.     Left upper body: No supraclavicular or axillary adenopathy.   Neurological:     General: No focal deficit present.     Mental Status: He is alert and oriented to person, place, and time.   Psychiatric:        Mood and Affect: Mood normal.        Behavior: Behavior normal.     LABS:   CBC     Component Value Date/Time   WBC 6.9 11/11/2023 1415   RBC 3.73 (L) 11/11/2023 1415   HGB 10.1 (L) 11/11/2023 1415   HCT 32.1 (L) 11/11/2023 1415   PLT 369 11/11/2023 1415   MCV 86.1 11/11/2023 1415   MCH 27.1 11/11/2023 1415   MCHC 31.5 11/11/2023 1415   RDW 14.9 11/11/2023 1415   LYMPHSABS 0.6 (L) 11/11/2023 1415   MONOABS 0.9 11/11/2023 1415   EOSABS 0.0 11/11/2023 1415   BASOSABS 0.1 11/11/2023 1415    CMP      Component Value Date/Time   NA 139 11/11/2023 1415   K 4.3 11/11/2023 1415   CL 107 11/11/2023 1415   CO2 24 11/11/2023 1415   GLUCOSE 168 (H) 11/11/2023 1415   BUN 14 11/11/2023 1415   CREATININE 1.08 11/11/2023 1415   CALCIUM 9.0 11/11/2023 1415   PROT 6.2 (L) 11/11/2023 1415   ALBUMIN 3.0 (L)  11/11/2023 1415   AST 15 11/11/2023 1415   ALT 11 11/11/2023 1415   ALKPHOS 52 11/11/2023 1415   BILITOT 0.5 11/11/2023 1415   GFRNONAA >60 11/11/2023 1415   GFRAA >60 02/06/2020 1349     No results found for: CEA1, CEA / No results found for: CEA1, CEA Lab Results  Component Value Date   PSA1 325.0 (H) 10/22/2023   No results found for: ZOX096 No results found for: EAV409  No results found for: TOTALPROTELP, ALBUMINELP, A1GS, A2GS, BETS, BETA2SER,  Nancie Axe, SPEI Lab Results  Component Value Date   TIBC 323 05/20/2022   TIBC 325 12/15/2021   TIBC 268 10/15/2021   FERRITIN 133 05/20/2022   FERRITIN 117 12/15/2021   FERRITIN 330 10/15/2021   IRONPCTSAT 12 (L) 05/20/2022   IRONPCTSAT 10 (L) 12/15/2021   IRONPCTSAT 23 10/15/2021   No results found for: LDH   STUDIES:   NM PET (PSMA) SKULL TO MID THIGH Result Date: 11/05/2023 CLINICAL DATA:  Multifocal skeletal metastasis. Castrate resistant prostate adenocarcinoma. Increasing PSA. Immunotherapy ongoing. EXAM: NUCLEAR MEDICINE PET SKULL BASE TO THIGH TECHNIQUE: 8.48 mCi Flotufolastat (Posluma ) was injected intravenously. Full-ring PET imaging was performed from the skull base to thigh after the radiotracer. CT data was obtained and used for attenuation correction and anatomic localization. COMPARISON:  PSMA PET scan 07/19/2023 FINDINGS: NECK New metastatic lymph node in the posterior triangle of the RIGHT neck with SUV max equal 25 on image 47 Incidental CT finding: None. CHEST New radiotracer avid mediastinal lymph node in the LEFT lower paratracheal nodal station with SUV max equal 24 on image 69. New radiotracer avid lymph node inferior to the carina the descending thoracic aorta. Incidental CT finding: No evidence of pulmonary metastasis. Stable LEFT pleural effusion ABDOMEN/PELVIS Prostate: No focal activity the prostate gland. Brachytherapy seeds noted. Lymph nodes: New cluster of intensely  radiotracer avid lymph nodes in the retroperitoneum along the aorta at the level the kidneys. Nodes are intense. For example node RIGHT of the aorta measuring 12 mm image 224 with SUV max equal 85. Similar intense nodes LEFT aorta on image 116 with SUV max equal 40. No adenopathy in the pelvis. Liver: 2 new radiotracer avid metastatic lesions in the LEFT hepatic lobe. Example lesion with SUV max equal 29.8 on image 109 lateral segment hepatic lobe. There is intense metastatic nodes along the pancreas in the gastrohepatic ligament on image 109. Incidental CT finding: None. SKELETON Marginal increase in metastatic lesions in the skeleton. Example new lesion in the RIGHT sacral ala with SUV max equal 43 on image 142. No clear CT change. New lesion in the LEFT iliac wing on image 143. increased involvement of the RIGHT scapula with SUV max equal 30. New lesions in the spine. For example new lesion at L1 with SUV max equal 31. IMPRESSION: 1. Unfortunately, there has been rapid progression of multifocal prostate cancer metastasis. 2. New nodal metastasis in the mediastinum. New nodal metastasis in the upper abdomen along the aorta and porta hepatis. 3. New hepatic metastasis. 4. Marked progression of skeletal metastasis. Electronically Signed   By: Deboraha Fallow M.D.   On: 11/05/2023 12:44   NM XOFIGO  INJECTION Result Date: 10/29/2023  Xofigo  was injected intravenously in Nuclear Medicine under the supervision of the attending radiologist

## 2023-11-11 ENCOUNTER — Inpatient Hospital Stay: Admitting: Hematology

## 2023-11-11 ENCOUNTER — Inpatient Hospital Stay

## 2023-11-11 ENCOUNTER — Telehealth: Payer: Self-pay | Admitting: *Deleted

## 2023-11-11 ENCOUNTER — Other Ambulatory Visit (HOSPITAL_COMMUNITY): Payer: Self-pay | Admitting: Radiation Oncology

## 2023-11-11 ENCOUNTER — Other Ambulatory Visit: Payer: Self-pay

## 2023-11-11 VITALS — BP 119/67 | HR 73 | Temp 98.9°F | Resp 18 | Ht 69.0 in | Wt 221.0 lb

## 2023-11-11 DIAGNOSIS — C61 Malignant neoplasm of prostate: Secondary | ICD-10-CM | POA: Diagnosis not present

## 2023-11-11 DIAGNOSIS — C771 Secondary and unspecified malignant neoplasm of intrathoracic lymph nodes: Secondary | ICD-10-CM

## 2023-11-11 DIAGNOSIS — Z5111 Encounter for antineoplastic chemotherapy: Secondary | ICD-10-CM | POA: Diagnosis not present

## 2023-11-11 LAB — COMPREHENSIVE METABOLIC PANEL WITH GFR
ALT: 11 U/L (ref 0–44)
AST: 15 U/L (ref 15–41)
Albumin: 3 g/dL — ABNORMAL LOW (ref 3.5–5.0)
Alkaline Phosphatase: 52 U/L (ref 38–126)
Anion gap: 8 (ref 5–15)
BUN: 14 mg/dL (ref 8–23)
CO2: 24 mmol/L (ref 22–32)
Calcium: 9 mg/dL (ref 8.9–10.3)
Chloride: 107 mmol/L (ref 98–111)
Creatinine, Ser: 1.08 mg/dL (ref 0.61–1.24)
GFR, Estimated: >60 mL/min (ref >60)
Glucose, Bld: 168 mg/dL — ABNORMAL HIGH (ref 70–99)
Potassium: 4.3 mmol/L (ref 3.5–5.1)
Sodium: 139 mmol/L (ref 135–145)
Total Bilirubin: 0.5 mg/dL (ref 0.0–1.2)
Total Protein: 6.2 g/dL — ABNORMAL LOW (ref 6.5–8.1)

## 2023-11-11 LAB — CBC WITH DIFFERENTIAL/PLATELET
Abs Immature Granulocytes: 0.03 10*3/uL (ref 0.00–0.07)
Basophils Absolute: 0.1 10*3/uL (ref 0.0–0.1)
Basophils Relative: 1 %
Eosinophils Absolute: 0 10*3/uL (ref 0.0–0.5)
Eosinophils Relative: 1 %
HCT: 32.1 % — ABNORMAL LOW (ref 39.0–52.0)
Hemoglobin: 10.1 g/dL — ABNORMAL LOW (ref 13.0–17.0)
Immature Granulocytes: 0 %
Lymphocytes Relative: 9 %
Lymphs Abs: 0.6 10*3/uL — ABNORMAL LOW (ref 0.7–4.0)
MCH: 27.1 pg (ref 26.0–34.0)
MCHC: 31.5 g/dL (ref 30.0–36.0)
MCV: 86.1 fL (ref 80.0–100.0)
Monocytes Absolute: 0.9 10*3/uL (ref 0.1–1.0)
Monocytes Relative: 13 %
Neutro Abs: 5.3 10*3/uL (ref 1.7–7.7)
Neutrophils Relative %: 76 %
Platelets: 369 10*3/uL (ref 150–400)
RBC: 3.73 MIL/uL — ABNORMAL LOW (ref 4.22–5.81)
RDW: 14.9 % (ref 11.5–15.5)
WBC: 6.9 10*3/uL (ref 4.0–10.5)
nRBC: 0 % (ref 0.0–0.2)

## 2023-11-11 MED ORDER — SODIUM CHLORIDE 0.9% FLUSH
10.0000 mL | Freq: Once | INTRAVENOUS | Status: AC
  Administered 2023-11-11: 10 mL

## 2023-11-11 MED ORDER — HEPARIN SOD (PORK) LOCK FLUSH 100 UNIT/ML IV SOLN
500.0000 [IU] | Freq: Once | INTRAVENOUS | Status: AC
  Administered 2023-11-11: 500 [IU] via INTRAVENOUS

## 2023-11-11 NOTE — Patient Instructions (Signed)
 [facility] at [facility] Discharge Instructions   You were seen and examined today by Dr. Cheree Cords.  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   Dr. Linnell Richardson will reach out to Dr. Lorri Rota to stop the Xofigo  treatments. Dr. Linnell Richardson will also reach out to colleagues at [facility] and [facility] to see if there are any new clinical trials or new treatments that can be offered you to get the cancer back under control.   Return as scheduled.    Thank you for choosing [facility] at [facility] to provide your oncology and hematology care.  To afford each patient quality time with our provider, please arrive at least 15 minutes before your scheduled appointment time.   If you have a lab appointment with the Cancer Center please come in thru the Main Entrance and check in at the main information desk.  You need to re-schedule your appointment should you arrive 10 or more minutes late.  We strive to give you quality time with our providers, and arriving late affects you and other patients whose appointments are after yours.  Also, if you no show three or more times for appointments you may be dismissed from the clinic at the providers discretion.     Again, thank you for choosing [facility].  Our hope is that these requests will decrease the amount of time that you wait before being seen by our physicians.       _____________________________________________________________  Should you have questions after your visit to [facility], please contact our office at [phone] and follow the prompts.  Our office hours are 8:00 a.m. and 4:30 p.m. Monday - Friday.  Please note that voicemails left after 4:00 p.m. may not be returned until the following business day.  We are closed weekends and major holidays.  You do have access to a nurse 24-7,  just call the main number to the clinic [phone] and do not press any options, hold on the line and a nurse will answer the phone.    For prescription refill requests, have your pharmacy contact our office and allow 72 hours.    Due to Covid, you will need to wear a mask upon entering the hospital. If you do not have a mask, a mask will be given to you at the Main Entrance upon arrival. For doctor visits, patients may have 1 support person age 85 or older with them. For treatment visits, patients can not have anyone with them due to social distancing guidelines and our immunocompromised population.

## 2023-11-11 NOTE — Telephone Encounter (Signed)
 CALLED PATIENT'S DAUGHTER- STEPHANIE Selvy OF NEXT LAB AND WEIGHT APPT. ON 11-25-23 @ 12 PM AND HIS XOFIGO  INJ. 12-02-23- ARRIVAL TIME- 11:45 AM @ [facility], LVM FOR A RETURN CALL

## 2023-11-16 ENCOUNTER — Other Ambulatory Visit: Payer: Self-pay | Admitting: Hematology

## 2023-11-16 DIAGNOSIS — C61 Malignant neoplasm of prostate: Secondary | ICD-10-CM

## 2023-11-16 NOTE — Progress Notes (Unsigned)
 Orders received to add Udenyca  OnBody 6 mg to day 1 treatment.  Approved per insurance.    Pharmacist Chemotherapy Monitoring - Initial Assessment    Anticipated start date: 11/18/23   The following has been reviewed per standard work regarding the patient's treatment regimen: The patient's diagnosis, treatment plan and drug doses, and organ/hematologic function Lab orders and baseline tests specific to treatment regimen  The treatment plan start date, drug sequencing, and pre-medications Prior authorization status  Patient's documented medication list, including drug-drug interaction screen and prescriptions for anti-emetics and supportive care specific to the treatment regimen The drug concentrations, fluid compatibility, administration routes, and timing of the medications to be used The patient's access for treatment and lifetime cumulative dose history, if applicable  The patient's medication allergies and previous infusion related reactions, if applicable   Changes made to treatment plan:  N/A  Follow up needed:  N/A   Andre [name], [facility], 11/18/2023  11:10 AM

## 2023-11-16 NOTE — Progress Notes (Signed)
 DISCONTINUE ON PATHWAY REGIMEN - Prostate     A cycle is every 21 days.:     Cabazitaxel       Prednisone    **Always confirm dose/schedule in your pharmacy ordering system**  PRIOR TREATMENT: [id number]: Cabazitaxel  20 mg/m2 q21 Days + Prednisone  10 mg Daily Until Progression  START OFF PATHWAY REGIMEN - Other   [id number]:Carboplatin AUC=5 IV D1 + Docetaxel 75 mg/m2 IV D1 q21 Days:   A cycle is every 21 days:     Docetaxel      Carboplatin   **Always confirm dose/schedule in your pharmacy ordering system**  Patient Characteristics: Intent of Therapy: Non-Curative / Palliative Intent, Discussed with Patient

## 2023-11-17 ENCOUNTER — Encounter: Payer: Self-pay | Admitting: Hematology

## 2023-11-17 ENCOUNTER — Other Ambulatory Visit: Payer: Self-pay

## 2023-11-18 ENCOUNTER — Inpatient Hospital Stay

## 2023-11-18 ENCOUNTER — Inpatient Hospital Stay (HOSPITAL_BASED_OUTPATIENT_CLINIC_OR_DEPARTMENT_OTHER): Admitting: Hematology

## 2023-11-18 VITALS — BP 120/59 | HR 61 | Temp 96.9°F | Resp 20

## 2023-11-18 VITALS — BP 134/74 | HR 70 | Temp 97.8°F | Resp 18

## 2023-11-18 DIAGNOSIS — C771 Secondary and unspecified malignant neoplasm of intrathoracic lymph nodes: Secondary | ICD-10-CM

## 2023-11-18 DIAGNOSIS — C61 Malignant neoplasm of prostate: Secondary | ICD-10-CM

## 2023-11-18 DIAGNOSIS — Z5111 Encounter for antineoplastic chemotherapy: Secondary | ICD-10-CM | POA: Diagnosis not present

## 2023-11-18 LAB — COMPREHENSIVE METABOLIC PANEL WITH GFR
ALT: 11 U/L (ref 0–44)
AST: 15 U/L (ref 15–41)
Albumin: 3.2 g/dL — ABNORMAL LOW (ref 3.5–5.0)
Alkaline Phosphatase: 42 U/L (ref 38–126)
Anion gap: 11 (ref 5–15)
BUN: 20 mg/dL (ref 8–23)
CO2: 26 mmol/L (ref 22–32)
Calcium: 9.1 mg/dL (ref 8.9–10.3)
Chloride: 103 mmol/L (ref 98–111)
Creatinine, Ser: 1.11 mg/dL (ref 0.61–1.24)
GFR, Estimated: >60 mL/min (ref >60)
Glucose, Bld: 111 mg/dL — ABNORMAL HIGH (ref 70–99)
Potassium: 4.1 mmol/L (ref 3.5–5.1)
Sodium: 140 mmol/L (ref 135–145)
Total Bilirubin: 0.8 mg/dL (ref 0.0–1.2)
Total Protein: 6.4 g/dL — ABNORMAL LOW (ref 6.5–8.1)

## 2023-11-18 LAB — CBC WITH DIFFERENTIAL/PLATELET
Abs Immature Granulocytes: 0.02 10*3/uL (ref 0.00–0.07)
Basophils Absolute: 0.1 10*3/uL (ref 0.0–0.1)
Basophils Relative: 1 %
Eosinophils Absolute: 0 10*3/uL (ref 0.0–0.5)
Eosinophils Relative: 1 %
HCT: 33.1 % — ABNORMAL LOW (ref 39.0–52.0)
Hemoglobin: 10.6 g/dL — ABNORMAL LOW (ref 13.0–17.0)
Immature Granulocytes: 0 %
Lymphocytes Relative: 9 %
Lymphs Abs: 0.6 10*3/uL — ABNORMAL LOW (ref 0.7–4.0)
MCH: 27.6 pg (ref 26.0–34.0)
MCHC: 32 g/dL (ref 30.0–36.0)
MCV: 86.2 fL (ref 80.0–100.0)
Monocytes Absolute: 0.7 10*3/uL (ref 0.1–1.0)
Monocytes Relative: 11 %
Neutro Abs: 5.1 10*3/uL (ref 1.7–7.7)
Neutrophils Relative %: 78 %
Platelets: 337 10*3/uL (ref 150–400)
RBC: 3.84 MIL/uL — ABNORMAL LOW (ref 4.22–5.81)
RDW: 16.3 % — ABNORMAL HIGH (ref 11.5–15.5)
WBC: 6.5 10*3/uL (ref 4.0–10.5)
nRBC: 0 % (ref 0.0–0.2)

## 2023-11-18 LAB — PSA: Prostatic Specific Antigen: 573.76 ng/mL — ABNORMAL HIGH (ref 0.00–4.00)

## 2023-11-18 MED ORDER — APREPITANT 130 MG/18ML IV EMUL
130.0000 mg | Freq: Once | INTRAVENOUS | Status: AC
  Administered 2023-11-18: 130 mg via INTRAVENOUS
  Filled 2023-11-18: qty 18

## 2023-11-18 MED ORDER — PALONOSETRON HCL INJECTION 0.25 MG/5ML
0.2500 mg | Freq: Once | INTRAVENOUS | Status: AC
  Administered 2023-11-18: 0.25 mg via INTRAVENOUS
  Filled 2023-11-18: qty 5

## 2023-11-18 MED ORDER — PEGFILGRASTIM-CBQV (INF DEV) 6 MG/0.6ML ~~LOC~~ SOSY
6.0000 mg | PREFILLED_SYRINGE | Freq: Once | SUBCUTANEOUS | Status: AC
  Administered 2023-11-18: 6 mg via SUBCUTANEOUS
  Filled 2023-11-18: qty 0.6

## 2023-11-18 MED ORDER — PEGFILGRASTIM 6 MG/0.6ML ~~LOC~~ PSKT: 6.0000 mg | PREFILLED_SYRINGE | Freq: Once | SUBCUTANEOUS | Status: DC

## 2023-11-18 MED ORDER — HEPARIN SOD (PORK) LOCK FLUSH 100 UNIT/ML IV SOLN
500.0000 [IU] | Freq: Once | INTRAVENOUS | Status: AC | PRN
  Administered 2023-11-18: 500 [IU]

## 2023-11-18 MED ORDER — SODIUM CHLORIDE 0.9 % IV SOLN
50.0000 mg/m2 | Freq: Once | INTRAVENOUS | Status: AC
  Administered 2023-11-18: 111 mg via INTRAVENOUS
  Filled 2023-11-18: qty 11.1

## 2023-11-18 MED ORDER — SODIUM CHLORIDE 0.9% FLUSH
10.0000 mL | INTRAVENOUS | Status: DC | PRN
Start: 2023-11-18 — End: 2023-11-18
  Administered 2023-11-18: 10 mL

## 2023-11-18 MED ORDER — SODIUM CHLORIDE 0.9% FLUSH
10.0000 mL | Freq: Once | INTRAVENOUS | Status: AC
  Administered 2023-11-18: 10 mL via INTRAVENOUS

## 2023-11-18 MED ORDER — SODIUM CHLORIDE 0.9 % IV SOLN: INTRAVENOUS | Status: DC

## 2023-11-18 MED ORDER — DEXAMETHASONE SODIUM PHOSPHATE 10 MG/ML IJ SOLN
10.0000 mg | Freq: Once | INTRAMUSCULAR | Status: AC
  Administered 2023-11-18: 10 mg via INTRAVENOUS
  Filled 2023-11-18: qty 1

## 2023-11-18 MED ORDER — SODIUM CHLORIDE 0.9 % IV SOLN
388.8000 mg | Freq: Once | INTRAVENOUS | Status: AC
  Administered 2023-11-18: 390 mg via INTRAVENOUS
  Filled 2023-11-18: qty 39

## 2023-11-18 NOTE — Patient Instructions (Signed)

## 2023-11-18 NOTE — Patient Instructions (Signed)
 [facility] [place] - A DEPT OF [place]  Discharge Instructions: Thank you for choosing [facility] to provide your oncology and hematology care.  If you have a lab appointment with the Cancer Center - please note that after April 8th, 2024, all labs will be drawn in the cancer center.  You do not have to check in or register with the main entrance as you have in the past but will complete your check-in in the cancer center.  Wear comfortable clothing and clothing appropriate for easy access to any Portacath or PICC line.   We strive to give you quality time with your provider. You may need to reschedule your appointment if you arrive late (15 or more minutes).  Arriving late affects you and other patients whose appointments are after yours.  Also, if you miss three or more appointments without notifying the office, you may be dismissed from the clinic at the provider's discretion.      For prescription refill requests, have your pharmacy contact our office and allow 72 hours for refills to be completed.    Today you received the following chemotherapy and/or immunotherapy agents Taxotere and Carboplatin.       To help prevent nausea and vomiting after your treatment, we encourage you to take your nausea medication as directed.  BELOW ARE SYMPTOMS THAT SHOULD BE REPORTED IMMEDIATELY: *FEVER GREATER THAN 100.4 F (38 C) OR HIGHER *CHILLS OR SWEATING *NAUSEA AND VOMITING THAT IS NOT CONTROLLED WITH YOUR NAUSEA MEDICATION *UNUSUAL SHORTNESS OF BREATH *UNUSUAL BRUISING OR BLEEDING *URINARY PROBLEMS (pain or burning when urinating, or frequent urination) *BOWEL PROBLEMS (unusual diarrhea, constipation, pain near the anus) TENDERNESS IN MOUTH AND THROAT WITH OR WITHOUT PRESENCE OF ULCERS (sore throat, sores in mouth, or a toothache) UNUSUAL RASH, SWELLING OR PAIN  UNUSUAL VAGINAL DISCHARGE OR ITCHING   Items with * indicate a potential emergency and  should be followed up as soon as possible or go to the Emergency Department if any problems should occur.  Please show the CHEMOTHERAPY ALERT CARD or IMMUNOTHERAPY ALERT CARD at check-in to the Emergency Department and triage nurse.  Should you have questions after your visit or need to cancel or reschedule your appointment, please contact [facility] [place] - A DEPT OF [facility] [phone]  and follow the prompts.  Office hours are 8:00 a.m. to 4:30 p.m. Monday - Friday. Please note that voicemails left after 4:00 p.m. may not be returned until the following business day.  We are closed weekends and major holidays. You have access to a nurse at all times for urgent questions. Please call the main number to the clinic [phone] and follow the prompts.  For any non-urgent questions, you may also contact your provider using MyChart. We now offer e-Visits for anyone 35 and older to request care online for non-urgent symptoms. For details visit [email or web address].   Also download the MyChart app! Go to the app store, search MyChart, open the app, select , and log in with your MyChart username and password.

## 2023-11-18 NOTE — Progress Notes (Signed)
 Patient has been examined by Dr. Ellin Saba. Vital signs and labs have been reviewed by MD - ANC, Creatinine, LFTs, hemoglobin, and platelets are within treatment parameters per M.D. - pt may proceed with treatment.  Primary RN and pharmacy notified.

## 2023-11-18 NOTE — Progress Notes (Signed)
 [name] [address], [place] [address]    Clinic Day:  11/18/2023  Referring physician: Clinic, Andre [name]  Patient Care Team: Clinic, Andre [name] as PCP - General Andre Hai, MD as Medical Oncologist (Medical Oncology) Andre Cough, MD as Consulting Physician (Radiation Oncology)   ASSESSMENT & PLAN:   Assessment: 1.  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 -Abiraterone  and prednisone  from 07/13/2016 through 04/09/2019, discontinued secondary to PSA progression. -Enzalutamide  from 04/12/2019 through 11/03/2019 with progression. -Last Lupron  on 06/09/2019. -CT CAP on 11/10/2019 shows no findings of active malignancy.  Stable small sclerotic lesions at T4 and T10, nonspecific.  Right hydronephrosis with right proximal hydroureter extending down to a 0.8 x 0.8 x 0.4 cm proximal ureteral stone at L3 vertical level.  Nonobstructive right and possibly left nephrolithiasis. -Bone scan on 11/10/2019 shows right proximal tibial metaphyseal activity from recent trauma.  Degenerative findings in the spine, right sternoclavicular joint, shoulders and right foot.  No evidence of metastatic disease. -Foundation 1 test shows MS-stable.  AR amplification.  Sensitivity is reduced due to sample quality. -Guardant 360 on 12/12/2019 MSI high not detected.  TMB was not evaluable.  No other targetable mutations. -[id number]-18-[id number]  PET scan showed intense radiotracer activity in the left supraclavicular and prevascular lymph nodes, measuring 10 mm.  Cluster of small prevascular lymph nodes measures 5-7 mm each with SUV 54.  AP window lymph node measuring 8 mm.  No activity in the prostate gland or abdominal or pelvic lymph nodes.  No skeletal activity. - SBRT to the left supraclavicular lymph node and prevascular mediastinal  lymph node from 08/27/2020 through 09/06/2020, 40 Gray in 5 fractions. - CT CAP (10/22/2021): Done at [facility]: No lymphadenopathy.  Cirrhosis.  Intrahepatic and extrahepatic biliary ductal dilatation. - Bone scan (10/28/2021): New focal abnormal tracer uptake at the inferior aspect of the right scapula. - PSMA PET scan (11/13/2021): Interval development of multifocal tracer avid bone metastasis, only 1 of these lesions is visible on the recent nuclear medicine bone scan dated 10/28/2021.  New tracer avid right axillary, subcarinal, left paratracheal, left retrocrural and upper abdominal lymph nodes. - 13 cycles of cabazitaxel  from 12/15/2021 through 09/08/2022 with progression - Pluvicto  cycle 1 on 11/16/2022, cycle 2 on 01/12/2023, cycle 3 on 02/09/2023, cycle 4 on 03/23/2023, cycle 5 on 05/04/2023 and cycle 6 on 06/15/2023. - Cycle 1 of Xofigo  on 10/29/2023, discontinued secondary to new liver lesions on PSMA PET scan on 11/04/2023. - Cycle 1 of docetaxel and carboplatin on 11/18/2023   2.  Androgen deprivation therapy induced bone loss: -Received Zometa  every 3 months for a year completed on 10/29/2017.   3.  Genetic testing: -Germline mutation testing was negative.    Plan: 1.  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  Eligard  45 mg on 10/26/2023. - PSMA PET scan (07/18/2020): Interval improvement in skeletal metastatic disease with decrease in activity with no evidence of nodal or visceral metastatic disease. - However his PSA continued to go up.  Last PSA on 09/08/2023 with increased to 122.  I have referred him to Dr. Patrcia for radium-223. - He received first cycle of radium-223 on 10/29/2023. - As his most recent PSA increased to 325 on 10/22/2023, I have done a PSMA PET scan. - PSMA PET  scan (11/04/2023): Rapid progression of multifocal bone metastasis, nodal metastasis in the mediastinum and upper abdomen, 2  new hepatic lesions. - We have discontinued the Xofigo . - I have reached out to Dr. Raynaldo at [facility].  Unfortunately no clinical trials available.  As he had rapid progression after Pluvicto  with liver lesion, I would not rechallenge him.  We discussed options of adding carboplatin to docetaxel or cabazitaxel .  Which was docetaxel and carboplatin as he did not have any progression when he previously received docetaxel.  We discussed side effects in detail.  We will give growth factor support. - Reviewed labs today: Normal LFTs.  CBC grossly normal.  PSA is 325.  Proceed with cycle 1 docetaxel and carboplatin today.  Will dose reduced docetaxel to 50 mg/m.  Will use carboplatin dose AUC 4.  RTC 3 weeks for follow-up.  If he tolerates well, may increase docetaxel to 60 mg/m.   2.  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  Continue denosumab  every 6 weeks.   3.  Leg swelling: - Continue Bumex  daily.  Mild swelling is stable.   4.  Peripheral neuropathy: - Numbness in the feet is stable.  Will closely monitor as we are starting him back on taxane.  5.  Hypomagnesemia: - Continue magnesium  twice daily.  Last magnesium  was normal.    Orders Placed This Encounter  Procedures   PSA    Standing Status:   Future    Expected Date:   01/20/2024    Expiration Date:   01/19/2025   Magnesium     Standing Status:   Future    Expected Date:   01/20/2024    Expiration Date:   01/19/2025   CBC with Differential    Standing Status:   Future    Expected Date:   01/20/2024    Expiration Date:   01/19/2025   Comprehensive metabolic panel    Standing Status:   Future    Expected Date:   01/20/2024    Expiration Date:   01/19/2025      LILLETTE Hummingbird R Teague,acting as a scribe for Alean Stands, MD.,have documented all relevant documentation on the behalf of Alean Stands, MD,as directed by  Alean Stands, MD while in the presence of Alean Stands,  MD.  I, Alean Stands MD, have reviewed the above documentation for accuracy and completeness, and I agree with the above.     Alean Stands, MD   6/26/20253:01 PM  CHIEF COMPLAINT:   Diagnosis: metastatic prostate cancer    Cancer Staging  No matching staging information was found for the patient.    Prior Therapy: 1. Docetaxel x 14 cycles from 08/11/2015 through 06/12/2016. 2. Abiraterone  from 07/13/2016 through 04/09/2019. 3. Enzalutamide  from 04/12/2019 to 11/03/2019. 4. SBRT to nodal mets, 08/27/20 - 09/06/20 5. ADT (Lupron /Eligard ) through 08/06/21 6. Cabazitaxel , 13 cycles, 12/15/21 - 09/08/22 7. Pluvicto , 6 cycles-- 11/16/22, 01/12/23, 02/09/23, 03/23/23, 05/04/23, and 06/15/23  Current Therapy: Observation   HISTORY OF PRESENT ILLNESS:   Oncology History  Prostate cancer metastatic to intrathoracic lymph node (HCC)  07/24/2015 Initial Diagnosis   Prostate cancer metastatic to the left supraclavicular and anterior mediastinal lymph nodes   08/21/2015 - 06/12/2016 Chemotherapy   Docetaxel every 3 weeks for 14 cycles, discontinued as PSA has plateaued around 11    07/13/2016 Treatment Plan Change   Zytiga  1000 milligrams daily along with prednisone  5 mg daily, started secondary to fluid overload from docetaxel   10/14/2018  Genetic Testing   Negative genetic testing.  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  The Common Hereditary Gene Panel offered by [facility] includes sequencing and/or deletion duplication testing of the following 48 genes: APC, ATM, AXIN2, BARD1, BMPR1A, BRCA1, BRCA2, BRIP1, CDH1, CDK4, CDKN2A (p14ARF), CDKN2A (p16INK4a), CHEK2, CTNNA1, DICER1, EPCAM (Deletion/duplication testing only), GREM1 (promoter region deletion/duplication testing only), KIT, MEN1, MLH1, MSH2, MSH3, MSH6, MUTYH, NBN, NF1, NHTL1, PALB2, PDGFRA, PMS2, POLD1, POLE, PTEN, RAD50, RAD51C, RAD51D, RNF43, SDHB, SDHC, SDHD, SMAD4, SMARCA4. STK11, TP53, TSC1, TSC2, and VHL.  The following genes  were evaluated for sequence changes only: SDHA and HOXB13 c.251G>A variant only. The report date is Oct 14, 2018.   09/18/2019 Genetic Testing   Foundation One CDx      12/12/2019 Genetic Testing   Guardant 360       12/15/2021 - 01/06/2022 Chemotherapy   Patient is on Treatment Plan : PROSTATE Cabazitaxel  + Prednisone  q21d     12/15/2021 - 09/08/2022 Chemotherapy   Patient is on Treatment Plan : PROSTATE Cabazitaxel  (20) D1 + Prednisone  D1-21 q21d     11/18/2023 -  Chemotherapy   Patient is on Treatment Plan : PROSTATE Carboplatin (AUC 4) + Docetaxel (60) q21d        INTERVAL HISTORY:   Andre [name] is a 83 y.o. male presenting to clinic today for follow up of metastatic prostate cancer. He was last seen by me on 11/11/23.  Today, he states that he is doing well overall. His appetite level is at 75%. His energy level is at 75%. Andre [name] is accompanied by his daughter. He reports the one treatment he received of Xofigo , he had side effects of dry mouth, changes in taste, and nausea. He notes Megace  has improved his appetite.   Zaylin has physical therapy for his left lower extremity pain, primarily around the hamstring, and would like to know if his therapist can massage the area. Pain has been present for the past 6 months and there is no known cancer in that area.   PAST MEDICAL HISTORY:   Past Medical History: Past Medical History:  Diagnosis Date   COPD (chronic obstructive pulmonary disease) (HCC)    Diabetes mellitus without complication (HCC)    Family history of ovarian cancer    Family history of prostate cancer    Hypertension    Prostate cancer [facility])     Surgical History: Past Surgical History:  Procedure Laterality Date   GOLD SEED IMPLANT      Social History: Social History   Socioeconomic History   Marital status: Widowed    Spouse name: Not on file   Number of children: Not on file   Years of education: Not on file   Highest education level: Not on file   Occupational History   Not on file  Tobacco Use   Smoking status: Former    Current packs/day: 0.00    Average packs/day: 0.3 packs/day for 60.0 years (15.0 ttl pk-yrs)    Types: Cigarettes    Start date: 06/25/1954    Quit date: 06/25/2014    Years since quitting: 9.4   Smokeless tobacco: Never   Tobacco comments:    smoked since young age, quit in 2016  Substance and Sexual Activity   Alcohol use: Not Currently   Drug use: No   Sexual activity: Not Currently  Other Topics Concern   Not on file  Social History Narrative   Not on file   Social Drivers of Health  Financial Resource Strain: Low Risk  (08/08/2020)   [facility]   Overall Financial Resource Strain (CARDIA)    Difficulty of Paying Living Expenses: Not hard at all  Food Insecurity: No Food Insecurity (09/30/2023)   Hunger Vital Sign    Worried About Running Out of Food in the Last Year: Never true    Ran Out of Food in the Last Year: Never true  Transportation Needs: No Transportation Needs (09/30/2023)   PRAPARE - Administrator, Civil Service (Medical): No    Lack of Transportation (Non-Medical): No  Physical Activity: Insufficiently Active (05/21/2020)   Exercise Vital Sign    Days of Exercise per Week: 2 days    Minutes of Exercise per Session: 20 min  Stress: No Stress Concern Present (05/21/2020)   [employer] of Occupational Health - Occupational Stress Questionnaire    Feeling of Stress : Not at all  Social Connections: Moderately Isolated (05/21/2020)   Social Connection and Isolation Panel    Frequency of Communication with Friends and Family: More than three times a week    Frequency of Social Gatherings with Friends and Family: More than three times a week    Attends Religious Services: 1 to 4 times per year    Active Member of [employer] or Organizations: No    Attends Banker Meetings: Never    Marital Status: Widowed  Intimate Partner Violence: Not At  Risk (09/30/2023)   Humiliation, Afraid, Rape, and Kick questionnaire    Fear of Current or Ex-Partner: No    Emotionally Abused: No    Physically Abused: No    Sexually Abused: No    Family History: Family History  Problem Relation Age of Onset   Ovarian cancer Mother 14   Heart attack Father    Prostate cancer Maternal Uncle 69   Cancer Cousin        pat cousin with unknown cancer   Colon cancer Neg Hx    Pancreatic cancer Neg Hx    Breast cancer Neg Hx     Current Medications:  Current Outpatient Medications:    ACCU-CHEK GUIDE test strip, , Disp: , Rfl:    Accu-Chek Softclix Lancets lancets, , Disp: , Rfl:    acetaminophen  (TYLENOL ) 500 MG tablet, Take 500 mg by mouth every 6 (six) hours as needed for moderate pain., Disp: , Rfl:    albuterol  (VENTOLIN  HFA) 108 (90 Base) MCG/ACT inhaler, INHALE 2 PUFFS BY MOUTH EVERY 4 TO 6 HOURS AS NEEDED, Disp: , Rfl:    amLODipine  (NORVASC ) 2.5 MG tablet, Take 1 tablet (2.5 mg total) by mouth daily as needed., Disp: 30 tablet, Rfl: 0   aspirin 81 MG EC tablet, Take 1 tablet by mouth daily., Disp: , Rfl:    atorvastatin  (LIPITOR) 10 MG tablet, TAKE 1 TABLET BY MOUTH EVERY DAY, Disp: 90 tablet, Rfl: 3   azelastine (ASTELIN) 0.1 % nasal spray, USE 2 SPRAYS IN EACH NOSTRIL TWICE A DAY FOR ALLERGIC RHINITIS, Disp: , Rfl:    Blood Glucose Monitoring Suppl (ACCU-CHEK GUIDE) w/Device KIT, , Disp: , Rfl:    brompheniramine-pseudoephedrine-DM 30-2-10 MG/5ML syrup, TAKE 5 ML BY MOUTH EVERY 4 HOURS AS NEEDED FOR [name], Disp: 120 mL, Rfl: 0   bumetanide  (BUMEX ) 1 MG tablet, Take 1.5 tablets (1.5 mg total) by mouth daily., Disp: 45 tablet, Rfl: 6   CALCIUM PO, Take 600 mg by mouth daily. , Disp: , Rfl:    cetirizine  (ZYRTEC )  10 MG tablet, Take 1 tablet by mouth daily., Disp: , Rfl:    CHERATUSSIN AC 100-10 MG/5ML syrup, Take 5 mLs by mouth daily as needed. , Disp: , Rfl: 0   Cholecalciferol (VITAMIN D3) 50 MCG (2000 UT) capsule, Take 2,000 Units by mouth  daily., Disp: , Rfl:    dicyclomine  (BENTYL ) 20 MG tablet, Take 1 tablet by mouth 3 (three) times daily as needed., Disp: , Rfl:    dorzolamide  (TRUSOPT ) 2 % ophthalmic solution, Place 1 drop into both eyes 3 (three) times daily., Disp: , Rfl:    erythromycin ophthalmic ointment, 3 (three) times daily., Disp: , Rfl:    ferrous sulfate 325 (65 FE) MG tablet, Take 1 tablet by mouth daily., Disp: , Rfl:    fexofenadine (ALLEGRA) 180 MG tablet, Take 1 tablet every day by oral route at bedtime for 90 days., Disp: , Rfl:    fluticasone  (FLONASE ) 50 MCG/ACT nasal spray, USE 2 SPRAY(S) IN EACH NOSTRIL ONCE DAILY FOR 30 DAYS, Disp: 15.8 mL, Rfl: 12   ibuprofen  (ADVIL ,MOTRIN ) 600 MG tablet, Take 600 mg by mouth every 6 (six) hours as needed. , Disp: , Rfl: 0   ipratropium (ATROVENT ) 0.03 % nasal spray, USE 2 SPRAYS IN EACH NOSTRIL TWICE DAILY AS NEEDED, Disp: , Rfl:    Ipratropium-Albuterol  (COMBIVENT) 20-100 MCG/ACT AERS respimat, INHALE 1 PUFF BY MOUTH FOUR TIMES A DAY AS NEEDED COPD, Disp: , Rfl:    latanoprost  (XALATAN ) 0.005 % ophthalmic solution, INSTILL 1 DROP INTO AFFECTED EYE(S) BY OPHTHALMIC ROUTE ONCE DAILY INTHE EVENING, Disp: , Rfl:    Latanoprostene Bunod  (VYZULTA ) 0.024 % SOLN, Apply to eye., Disp: , Rfl:    Leuprolide  Acetate, 4 Month, (ELIGARD ) 30 MG injection, 30 MG SUBCUTANEOUSLY Q90D PRN, Disp: , Rfl:    losartan  (COZAAR ) 100 MG tablet, Take 1 tablet by mouth daily., Disp: , Rfl:    magnesium  oxide (MAG-OX) 400 (240 Mg) MG tablet, TAKE 1 TABLET BY MOUTH TWICE DAILY, Disp: 180 tablet, Rfl: 3   magnesium  oxide (MAG-OX) 400 MG tablet, Take 1 tablet (400 mg total) by mouth daily. (Patient taking differently: Take 400 mg by mouth 2 (two) times daily.), Disp: 90 tablet, Rfl: 2   megestrol  (MEGACE ) 400 MG/10ML suspension, Take 10 mLs (400 mg total) by mouth 2 (two) times daily., Disp: 480 mL, Rfl: 0   metFORMIN  (GLUCOPHAGE ) 500 MG tablet, Take 2 tablets (1,000 mg total) by mouth 2 (two) times  daily with a meal., Disp: 120 tablet, Rfl: 3   montelukast (SINGULAIR) 10 MG tablet, Take 10 mg by mouth at bedtime., Disp: , Rfl:    Multiple Vitamins-Minerals (PRESERVISION AREDS PO), 1 tablet daily., Disp: , Rfl:    potassium chloride  (MICRO-K ) 10 MEQ CR capsule, TAKE 4 CAPSULE BY MOUTH DAILY WITH FOOD, OPEN AND SPRINKLE ON FOOD, Disp: 348 capsule, Rfl: 2   prochlorperazine  (COMPAZINE ) 10 MG tablet, Take 1 tablet (10 mg total) by mouth every 6 (six) hours as needed for nausea or vomiting., Disp: 60 tablet, Rfl: 3   sucralfate  (CARAFATE ) 1 g tablet, Take 1 tablet by mouth 4 (four) times daily as needed., Disp: , Rfl:    tamsulosin  (FLOMAX ) 0.4 MG CAPS capsule, Take 1 tablet by mouth daily., Disp: , Rfl:    TRELEGY ELLIPTA  200-62.5-25 MCG/ACT AEPB, Take 1 puff by mouth daily., Disp: , Rfl:    triamcinolone cream (KENALOG) 0.1 %, Apply topically 2 (two) times daily., Disp: , Rfl:    vitamin C (ASCORBIC ACID)  500 MG tablet, Take 500 mg by mouth daily., Disp: , Rfl:  No current facility-administered medications for this visit.  Facility-Administered Medications Ordered in Other Visits:    0.9 %  sodium chloride  infusion, , Intravenous, Continuous, Andre Hai, MD, Stopped at 11/18/23 1437   denosumab  (XGEVA ) 120 MG/1.7ML injection, , , ,    diphenhydrAMINE  (BENADRYL ) 50 MG/ML injection, , , ,    famotidine  (PEPCID ) 20-0.9 MG/50ML-% IVPB, , , ,    sodium chloride  flush (NS) 0.9 % injection 10 mL, 10 mL, Intracatheter, PRN, Moneisha Vosler, MD, 10 mL at 11/18/23 1437   Allergies: Allergies  Allergen Reactions   Lisinopril [name]   Metoprolol Other (See Comments) and [name]    Increases frequency of [name] Other reaction(s): Other (See Comments) Increases frequency of [name]   Semaglutide Nausea And Vomiting    REVIEW OF SYSTEMS:   Review of Systems  Constitutional:  Negative for chills, fatigue and fever.  HENT:   Negative for lump/mass, mouth sores, nosebleeds, sore throat  and trouble swallowing.   Eyes:  Negative for eye problems.  Respiratory:  Positive for shortness of breath. Negative for [name].   Cardiovascular:  Negative for chest pain, leg swelling and palpitations.  Gastrointestinal:  Negative for abdominal pain, constipation, diarrhea, nausea and vomiting.  Genitourinary:  Negative for bladder incontinence, difficulty urinating, dysuria, frequency, hematuria and nocturia.   Musculoskeletal:  Negative for arthralgias, back pain, flank pain, myalgias and neck pain.       +sharp left leg pain, 3/10 severity  Skin:  Negative for itching and rash.  Neurological:  Positive for numbness (in hands and feet). Negative for dizziness and headaches.  Hematological:  Does not bruise/bleed easily.  Psychiatric/Behavioral:  Negative for depression, sleep disturbance and suicidal ideas. The patient is not nervous/anxious.   All other systems reviewed and are negative.    VITALS:   There were no vitals taken for this visit.  Wt Readings from Last 3 Encounters:  11/11/23 221 lb (100.2 kg)  09/30/23 220 lb (99.8 kg)  09/08/23 227 lb (103 kg)    There is no height or weight on file to calculate BMI.  Performance status (ECOG): 1 - Symptomatic but completely ambulatory  PHYSICAL EXAM:   Physical Exam Vitals and nursing note reviewed. Exam conducted with a chaperone present.  Constitutional:      Appearance: Normal appearance.   Cardiovascular:     Rate and Rhythm: Normal rate and regular rhythm.     Pulses: Normal pulses.     Heart sounds: Normal heart sounds.  Pulmonary:     Effort: Pulmonary effort is normal.     Breath sounds: Normal breath sounds.  Abdominal:     Palpations: Abdomen is soft. There is no hepatomegaly, splenomegaly or mass.     Tenderness: There is no abdominal tenderness.   Musculoskeletal:     Right lower leg: No edema.     Left lower leg: No edema.  Lymphadenopathy:     Cervical: No cervical adenopathy.     Right cervical: No  superficial, deep or posterior cervical adenopathy.    Left cervical: No superficial, deep or posterior cervical adenopathy.     Upper Body:     Right upper body: No supraclavicular or axillary adenopathy.     Left upper body: No supraclavicular or axillary adenopathy.   Neurological:     General: No focal deficit present.     Mental Status: He is alert and oriented to person, place,  and time.   Psychiatric:        Mood and Affect: Mood normal.        Behavior: Behavior normal.     LABS:   CBC     Component Value Date/Time   WBC 6.5 11/18/2023 0910   RBC 3.84 (L) 11/18/2023 0910   HGB 10.6 (L) 11/18/2023 0910   HCT 33.1 (L) 11/18/2023 0910   PLT 337 11/18/2023 0910   MCV 86.2 11/18/2023 0910   MCH 27.6 11/18/2023 0910   MCHC 32.0 11/18/2023 0910   RDW 16.3 (H) 11/18/2023 0910   LYMPHSABS 0.6 (L) 11/18/2023 0910   MONOABS 0.7 11/18/2023 0910   EOSABS 0.0 11/18/2023 0910   BASOSABS 0.1 11/18/2023 0910    CMP      Component Value Date/Time   NA 140 11/18/2023 0910   K 4.1 11/18/2023 0910   CL 103 11/18/2023 0910   CO2 26 11/18/2023 0910   GLUCOSE 111 (H) 11/18/2023 0910   BUN 20 11/18/2023 0910   CREATININE 1.11 11/18/2023 0910   CALCIUM 9.1 11/18/2023 0910   PROT 6.4 (L) 11/18/2023 0910   ALBUMIN 3.2 (L) 11/18/2023 0910   AST 15 11/18/2023 0910   ALT 11 11/18/2023 0910   ALKPHOS 42 11/18/2023 0910   BILITOT 0.8 11/18/2023 0910   GFRNONAA >60 11/18/2023 0910   GFRAA >60 02/06/2020 1349     No results found for: CEA1, CEA / No results found for: CEA1, CEA Lab Results  Component Value Date   PSA1 325.0 (H) 10/22/2023   No results found for: CAN199 No results found for: CAN125  No results found for: STEPHANY RINGS, A1GS, A2GS, EARLA BABCOCK, GAMS, MSPIKE, SPEI Lab Results  Component Value Date   TIBC 323 05/20/2022   TIBC 325 12/15/2021   TIBC 268 10/15/2021   FERRITIN 133 05/20/2022   FERRITIN 117 12/15/2021    FERRITIN 330 10/15/2021   IRONPCTSAT 12 (L) 05/20/2022   IRONPCTSAT 10 (L) 12/15/2021   IRONPCTSAT 23 10/15/2021   No results found for: LDH   STUDIES:   NM PET (PSMA) SKULL TO MID THIGH Result Date: 11/05/2023 CLINICAL DATA:  Multifocal skeletal metastasis. Castrate resistant prostate adenocarcinoma. Increasing PSA. Immunotherapy ongoing. EXAM: NUCLEAR MEDICINE PET SKULL BASE TO THIGH TECHNIQUE: 8.48 mCi Flotufolastat (Posluma ) was injected intravenously. Full-ring PET imaging was performed from the skull base to thigh after the radiotracer. CT data was obtained and used for attenuation correction and anatomic localization. COMPARISON:  PSMA PET scan 07/19/2023 FINDINGS: NECK New metastatic lymph node in the posterior triangle of the RIGHT neck with SUV max equal 25 on image 47 Incidental CT finding: None. CHEST New radiotracer avid mediastinal lymph node in the LEFT lower paratracheal nodal station with SUV max equal 24 on image 69. New radiotracer avid lymph node inferior to the carina the descending thoracic aorta. Incidental CT finding: No evidence of pulmonary metastasis. Stable LEFT pleural effusion ABDOMEN/PELVIS Prostate: No focal activity the prostate gland. Brachytherapy seeds noted. Lymph nodes: New cluster of intensely radiotracer avid lymph nodes in the retroperitoneum along the aorta at the level the kidneys. Nodes are intense. For example node RIGHT of the aorta measuring 12 mm image 224 with SUV max equal 85. Similar intense nodes LEFT aorta on image 116 with SUV max equal 40. No adenopathy in the pelvis. Liver: 2 new radiotracer avid metastatic lesions in the LEFT hepatic lobe. Example lesion with SUV max equal 29.8 on image 109 lateral segment hepatic lobe. There is  intense metastatic nodes along the pancreas in the gastrohepatic ligament on image 109. Incidental CT finding: None. SKELETON Marginal increase in metastatic lesions in the skeleton. Example new lesion in the RIGHT  sacral ala with SUV max equal 43 on image 142. No clear CT change. New lesion in the LEFT iliac wing on image 143. increased involvement of the RIGHT scapula with SUV max equal 30. New lesions in the spine. For example new lesion at L1 with SUV max equal 31. IMPRESSION: 1. Unfortunately, there has been rapid progression of multifocal prostate cancer metastasis. 2. New nodal metastasis in the mediastinum. New nodal metastasis in the upper abdomen along the aorta and porta hepatis. 3. New hepatic metastasis. 4. Marked progression of skeletal metastasis. Electronically Signed   By: Jackquline Boxer M.D.   On: 11/05/2023 12:44   NM XOFIGO  INJECTION Result Date: 10/29/2023  Xofigo  was injected intravenously in Nuclear Medicine under the supervision of the attending radiologist

## 2023-11-18 NOTE — Progress Notes (Signed)
 Consent signed and copy given to the patient and family.  All questions answered.    Patient tolerated chemotherapy with no complaints voiced.  Side effects with management reviewed with understanding verbalized.  Port site clean and dry with no bruising or swelling noted at site.  Good blood return noted before and after administration of chemotherapy.  Band aid applied.  Udenyca  OnPro placed on right arm with green flashing light noted. Reviewed instructions for OnPro with understanding verbalized.    Patient left in satisfactory condition with VSS and no s/s of distress noted.

## 2023-11-19 ENCOUNTER — Telehealth (HOSPITAL_COMMUNITY): Payer: Self-pay | Admitting: *Deleted

## 2023-11-19 NOTE — Telephone Encounter (Signed)
 24 hour chemotherapy call placed today. Patient stated he felt tired but voiced no other complaints. Patient advised to call the clinic if needed. Patient verbalized understanding.

## 2023-11-25 ENCOUNTER — Ambulatory Visit

## 2023-12-02 ENCOUNTER — Encounter (HOSPITAL_COMMUNITY)

## 2023-12-06 ENCOUNTER — Encounter: Admitting: Dietician

## 2023-12-06 ENCOUNTER — Telehealth: Payer: Self-pay | Admitting: Dietician

## 2023-12-06 NOTE — Telephone Encounter (Signed)
 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 He is currently receiving carboplatin  + docetaxel  q21d. Patient is under the care of Dr. Rogers.   Past medical history includes HTN, chronic bronchitis, COPD  Spoke with daughter of patient via telephone. She reports patient doing well overall. Appetite has improved with start of Megace . Patient eating 3 good meals as usual, however weights continue to decline. He has eggs, sausage, toast or pancakes and bacon for breakfast. Lunch is usually a sandwich, either subway or from home. Last night had baked pork chops, cabbage, mashed potatoes. Daughter has purchased Boost for him to drink. Denies patient having nausea, vomiting, diarrhea, constipation.    Nutrition Focused Physical Exam: unable to complete (telephone visit)   Medications: amlodipine , lipitor, bumex , D3, pepcid , ferrous sulfate, megace , mag-ox, metformin , cozaar , bentyl    Labs: 6/26- glucose 111, albumin 3.2   Anthropometrics:   Height: 5'9 Weight: 221 lb  UBW: 230-235 lb  BMI: 32.64    NUTRITION DIAGNOSIS: Unintended weight loss related to cancer as evidenced by 8% wt loss in 14 weeks which is significant    INTERVENTION:  Educated on increased energy intake needs to support metabolic demand Discussed strategies for adding cal/protein to foods (adding cheese/sauces/gravy/butter, switching to fairlife whole milk - add to oatmeal/condensed soups, adding ONS to ice cream/coffee) Recommend daily Ensure Complete/equivalent Continue megace  for appetite per MD Will leave Ensure, CIB samples, coupons, snack ideas/soft protein foods, + contact information for p/u at 7/17 [facility] appt)  MONITORING, EVALUATION, GOAL: Pt will tolerate increased calories and protein    Next Visit: Monday August 11 via telephone

## 2023-12-07 ENCOUNTER — Other Ambulatory Visit: Payer: Self-pay | Admitting: Hematology

## 2023-12-09 ENCOUNTER — Inpatient Hospital Stay

## 2023-12-09 ENCOUNTER — Inpatient Hospital Stay: Admitting: Hematology

## 2023-12-09 ENCOUNTER — Inpatient Hospital Stay: Attending: Hematology

## 2023-12-09 VITALS — BP 136/78 | HR 88 | Temp 97.6°F | Resp 18

## 2023-12-09 VITALS — Wt 219.1 lb

## 2023-12-09 DIAGNOSIS — C781 Secondary malignant neoplasm of mediastinum: Secondary | ICD-10-CM | POA: Insufficient documentation

## 2023-12-09 DIAGNOSIS — C771 Secondary and unspecified malignant neoplasm of intrathoracic lymph nodes: Secondary | ICD-10-CM

## 2023-12-09 DIAGNOSIS — C7951 Secondary malignant neoplasm of bone: Secondary | ICD-10-CM | POA: Insufficient documentation

## 2023-12-09 DIAGNOSIS — C61 Malignant neoplasm of prostate: Secondary | ICD-10-CM

## 2023-12-09 DIAGNOSIS — Z5189 Encounter for other specified aftercare: Secondary | ICD-10-CM | POA: Insufficient documentation

## 2023-12-09 DIAGNOSIS — Z79899 Other long term (current) drug therapy: Secondary | ICD-10-CM | POA: Diagnosis not present

## 2023-12-09 DIAGNOSIS — Z7984 Long term (current) use of oral hypoglycemic drugs: Secondary | ICD-10-CM | POA: Insufficient documentation

## 2023-12-09 DIAGNOSIS — Z7982 Long term (current) use of aspirin: Secondary | ICD-10-CM | POA: Insufficient documentation

## 2023-12-09 DIAGNOSIS — Z5111 Encounter for antineoplastic chemotherapy: Secondary | ICD-10-CM | POA: Insufficient documentation

## 2023-12-09 DIAGNOSIS — Z87891 Personal history of nicotine dependence: Secondary | ICD-10-CM | POA: Diagnosis not present

## 2023-12-09 DIAGNOSIS — C787 Secondary malignant neoplasm of liver and intrahepatic bile duct: Secondary | ICD-10-CM | POA: Insufficient documentation

## 2023-12-09 LAB — COMPREHENSIVE METABOLIC PANEL WITH GFR
ALT: 16 U/L (ref 0–44)
AST: 16 U/L (ref 15–41)
Albumin: 3.5 g/dL (ref 3.5–5.0)
Alkaline Phosphatase: 44 U/L (ref 38–126)
Anion gap: 11 (ref 5–15)
BUN: 34 mg/dL — ABNORMAL HIGH (ref 8–23)
CO2: 28 mmol/L (ref 22–32)
Calcium: 9.6 mg/dL (ref 8.9–10.3)
Chloride: 100 mmol/L (ref 98–111)
Creatinine, Ser: 1.19 mg/dL (ref 0.61–1.24)
GFR, Estimated: >60 mL/min (ref >60)
Glucose, Bld: 144 mg/dL — ABNORMAL HIGH (ref 70–99)
Potassium: 4.1 mmol/L (ref 3.5–5.1)
Sodium: 139 mmol/L (ref 135–145)
Total Bilirubin: 0.7 mg/dL (ref 0.0–1.2)
Total Protein: 6.4 g/dL — ABNORMAL LOW (ref 6.5–8.1)

## 2023-12-09 LAB — CBC WITH DIFFERENTIAL/PLATELET
Abs Immature Granulocytes: 0.11 K/uL — ABNORMAL HIGH (ref 0.00–0.07)
Basophils Absolute: 0.1 K/uL (ref 0.0–0.1)
Basophils Relative: 1 %
Eosinophils Absolute: 0 K/uL (ref 0.0–0.5)
Eosinophils Relative: 0 %
HCT: 33.4 % — ABNORMAL LOW (ref 39.0–52.0)
Hemoglobin: 10.3 g/dL — ABNORMAL LOW (ref 13.0–17.0)
Immature Granulocytes: 1 %
Lymphocytes Relative: 8 %
Lymphs Abs: 0.6 K/uL — ABNORMAL LOW (ref 0.7–4.0)
MCH: 27 pg (ref 26.0–34.0)
MCHC: 30.8 g/dL (ref 30.0–36.0)
MCV: 87.4 fL (ref 80.0–100.0)
Monocytes Absolute: 0.9 K/uL (ref 0.1–1.0)
Monocytes Relative: 11 %
Neutro Abs: 6.4 K/uL (ref 1.7–7.7)
Neutrophils Relative %: 79 %
Platelets: 235 K/uL (ref 150–400)
RBC: 3.82 MIL/uL — ABNORMAL LOW (ref 4.22–5.81)
RDW: 19.2 % — ABNORMAL HIGH (ref 11.5–15.5)
WBC: 8.2 K/uL (ref 4.0–10.5)
nRBC: 0.2 % (ref 0.0–0.2)

## 2023-12-09 LAB — PSA: Prostatic Specific Antigen: 447.05 ng/mL — ABNORMAL HIGH (ref 0.00–4.00)

## 2023-12-09 LAB — MAGNESIUM: Magnesium: 1.9 mg/dL (ref 1.7–2.4)

## 2023-12-09 MED ORDER — APREPITANT 130 MG/18ML IV EMUL
130.0000 mg | Freq: Once | INTRAVENOUS | Status: AC
  Administered 2023-12-09: 130 mg via INTRAVENOUS
  Filled 2023-12-09: qty 18

## 2023-12-09 MED ORDER — PALONOSETRON HCL INJECTION 0.25 MG/5ML
0.2500 mg | Freq: Once | INTRAVENOUS | Status: AC
  Administered 2023-12-09: 0.25 mg via INTRAVENOUS
  Filled 2023-12-09: qty 5

## 2023-12-09 MED ORDER — SODIUM CHLORIDE 0.9 % IV SOLN: INTRAVENOUS | Status: DC

## 2023-12-09 MED ORDER — SODIUM CHLORIDE 0.9% FLUSH
10.0000 mL | INTRAVENOUS | Status: DC | PRN
  Administered 2023-12-09: 10 mL

## 2023-12-09 MED ORDER — HEPARIN SOD (PORK) LOCK FLUSH 100 UNIT/ML IV SOLN
500.0000 [IU] | Freq: Once | INTRAVENOUS | Status: AC | PRN
  Administered 2023-12-09: 500 [IU]

## 2023-12-09 MED ORDER — SODIUM CHLORIDE 0.9 % IV SOLN
50.0000 mg/m2 | Freq: Once | INTRAVENOUS | Status: AC
  Administered 2023-12-09: 111 mg via INTRAVENOUS
  Filled 2023-12-09: qty 11.1

## 2023-12-09 MED ORDER — SODIUM CHLORIDE 0.9% FLUSH
10.0000 mL | INTRAVENOUS | Status: DC | PRN
  Administered 2023-12-09: 10 mL via INTRAVENOUS

## 2023-12-09 MED ORDER — SODIUM CHLORIDE 0.9 % IV SOLN
486.0000 mg | Freq: Once | INTRAVENOUS | Status: AC
  Administered 2023-12-09: 490 mg via INTRAVENOUS
  Filled 2023-12-09: qty 49

## 2023-12-09 MED ORDER — PEGFILGRASTIM-CBQV (INF DEV) 6 MG/0.6ML ~~LOC~~ SOSY
6.0000 mg | PREFILLED_SYRINGE | Freq: Once | SUBCUTANEOUS | Status: AC
  Administered 2023-12-09: 6 mg via SUBCUTANEOUS
  Filled 2023-12-09: qty 0.6

## 2023-12-09 MED ORDER — DEXAMETHASONE SODIUM PHOSPHATE 10 MG/ML IJ SOLN
10.0000 mg | Freq: Once | INTRAMUSCULAR | Status: AC
  Administered 2023-12-09: 10 mg via INTRAVENOUS
  Filled 2023-12-09: qty 1

## 2023-12-09 NOTE — Patient Instructions (Signed)

## 2023-12-09 NOTE — Progress Notes (Signed)

## 2023-12-09 NOTE — Progress Notes (Signed)
 Confirmed Carboplatin  dose increase today to AUC 5.  V.O. Dr Theadore Molt, PharmD

## 2023-12-09 NOTE — Progress Notes (Signed)
 Patient has been examined by Dr. Ellin Saba. Vital signs and labs have been reviewed by MD - ANC, Creatinine, LFTs, hemoglobin, and platelets are within treatment parameters per M.D. - pt may proceed with treatment.  Primary RN and pharmacy notified.

## 2023-12-09 NOTE — Patient Instructions (Signed)
 [facility] - A DEPT OF [facility]  Discharge Instructions: Thank you for choosing [facility] to provide your oncology and hematology care.  If you have a lab appointment with the Cancer Center - please note that after April 8th, 2024, all labs will be drawn in the cancer center.  You do not have to check in or register with the main entrance as you have in the past but will complete your check-in in the cancer center.  Wear comfortable clothing and clothing appropriate for easy access to any Portacath or PICC line.   We strive to give you quality time with your provider. You may need to reschedule your appointment if you arrive late (15 or more minutes).  Arriving late affects you and other patients whose appointments are after yours.  Also, if you miss three or more appointments without notifying the office, you may be dismissed from the clinic at the provider's discretion.      For prescription refill requests, have your pharmacy contact our office and allow 72 hours for refills to be completed.    Today you received the following chemotherapy and/or immunotherapy agents taxotere  and carboplatin        To help prevent nausea and vomiting after your treatment, we encourage you to take your nausea medication as directed.  BELOW ARE SYMPTOMS THAT SHOULD BE REPORTED IMMEDIATELY: *FEVER GREATER THAN 100.4 F (38 C) OR HIGHER *CHILLS OR SWEATING *NAUSEA AND VOMITING THAT IS NOT CONTROLLED WITH YOUR NAUSEA MEDICATION *UNUSUAL SHORTNESS OF BREATH *UNUSUAL BRUISING OR BLEEDING *URINARY PROBLEMS (pain or burning when urinating, or frequent urination) *BOWEL PROBLEMS (unusual diarrhea, constipation, pain near the anus) TENDERNESS IN MOUTH AND THROAT WITH OR WITHOUT PRESENCE OF ULCERS (sore throat, sores in mouth, or a toothache) UNUSUAL RASH, SWELLING OR PAIN  UNUSUAL VAGINAL DISCHARGE OR ITCHING   Items with * indicate a potential emergency and  should be followed up as soon as possible or go to the Emergency Department if any problems should occur.  Please show the CHEMOTHERAPY ALERT CARD or IMMUNOTHERAPY ALERT CARD at check-in to the Emergency Department and triage nurse.  Should you have questions after your visit or need to cancel or reschedule your appointment, please contact [facility] [place] - A DEPT OF [facility] [phone]  and follow the prompts.  Office hours are 8:00 a.m. to 4:30 p.m. Monday - Friday. Please note that voicemails left after 4:00 p.m. may not be returned until the following business day.  We are closed weekends and major holidays. You have access to a nurse at all times for urgent questions. Please call the main number to the clinic [phone] and follow the prompts.  For any non-urgent questions, you may also contact your provider using MyChart. We now offer e-Visits for anyone 47 and older to request care online for non-urgent symptoms. For details visit [email or web address].   Also download the MyChart app! Go to the app store, search MyChart, open the app, select [facility], and log in with your MyChart username and password.

## 2023-12-09 NOTE — Progress Notes (Addendum)
 [facility] [address] [place] [address]    Clinic Day:  12/09/2023  Referring physician: Clinic, Bonni Lien  Patient Care Team: Clinic, Bonni Lien as PCP - General Rogers Hai, MD as Medical Oncologist (Medical Oncology) Patrcia Cough, MD as Consulting Physician (Radiation Oncology)   ASSESSMENT & PLAN:   Assessment: 1.  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 -Abiraterone  and prednisone  from 07/13/2016 through 04/09/2019, discontinued secondary to PSA progression. -Enzalutamide  from 04/12/2019 through 11/03/2019 with progression. -Last Lupron  on 06/09/2019. -CT CAP on 11/10/2019 shows no findings of active malignancy.  Stable small sclerotic lesions at T4 and T10, nonspecific.  Right hydronephrosis with right proximal hydroureter extending down to a 0.8 x 0.8 x 0.4 cm proximal ureteral stone at L3 vertical level.  Nonobstructive right and possibly left nephrolithiasis. -Bone scan on 11/10/2019 shows right proximal tibial metaphyseal activity from recent trauma.  Degenerative findings in the spine, right sternoclavicular joint, shoulders and right foot.  No evidence of metastatic disease. -Foundation 1 test shows MS-stable.  AR amplification.  Sensitivity is reduced due to sample quality. -Guardant 360 on 12/12/2019 MSI high not detected.  TMB was not evaluable.  No other targetable mutations. -[id number]  PET scan showed intense radiotracer activity in the left supraclavicular and prevascular lymph nodes, measuring 10 mm.  Cluster of small prevascular lymph nodes measures 5-7 mm each with SUV 54.  AP window lymph node measuring 8 mm.  No activity in the prostate gland or abdominal or pelvic lymph nodes.  No skeletal activity. - SBRT to the left supraclavicular lymph node and prevascular mediastinal  lymph node from 08/27/2020 through 09/06/2020, 40 Gray in 5 fractions. - CT CAP (10/22/2021): Done at [facility]: No lymphadenopathy.  Cirrhosis.  Intrahepatic and extrahepatic biliary ductal dilatation. - Bone scan (10/28/2021): New focal abnormal tracer uptake at the inferior aspect of the right scapula. - PSMA PET scan (11/13/2021): Interval development of multifocal tracer avid bone metastasis, only 1 of these lesions is visible on the recent nuclear medicine bone scan dated 10/28/2021.  New tracer avid right axillary, subcarinal, left paratracheal, left retrocrural and upper abdominal lymph nodes. - 13 cycles of cabazitaxel  from 12/15/2021 through 09/08/2022 with progression - Pluvicto  cycle 1 on 11/16/2022, cycle 2 on 01/12/2023, cycle 3 on 02/09/2023, cycle 4 on 03/23/2023, cycle 5 on 05/04/2023 and cycle 6 on 06/15/2023. - Cycle 1 of Xofigo  on 10/29/2023, discontinued secondary to new liver lesions on PSMA PET scan on 11/04/2023. - Cycle 1 of docetaxel  and carboplatin  on 11/18/2023   2.  Androgen deprivation therapy induced bone loss: -Received Zometa  every 3 months for a year completed on 10/29/2017.   3.  Genetic testing: -Germline mutation testing was negative.    Plan: 1.  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 - He was started on docetaxel  and carboplatin  on 11/18/2023. - He did not have any significant GI side effects. - He noticed decrease in appetite.  He started drinking 2 Ensure/boost per day.  He lost about 2 pounds. - Reviewed labs today: Normal LFTs and creatinine.  CBC was normal.  PSA has improved 447 from 573. - He may proceed with cycle 2 today.  Will increase carboplatin  to AUC=  5, continue docetaxel  at 50 mg/m. - RTC 3 weeks for follow-up.  As I am leaving the practice in August, he  asked me to make a referral to [facility].  Will make a referral to Dr. Tina.   2.  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  Continue denosumab  every 6 weeks.   3.  Leg swelling: - Continue Bumex  daily.  Swelling is stable.   4.  Peripheral neuropathy: - Numbness in the feet is stable.  Will closely monitor since we started him on docetaxel .  5.  Hypomagnesemia: - Continue magnesium  twice daily.  Magnesium  is normal.    No orders of the defined types were placed in this encounter.     LILLETTE Verneta SAUNDERS Teague,acting as a Neurosurgeon for Alean Stands, MD.,have documented all relevant documentation on the behalf of Alean Stands, MD,as directed by  Alean Stands, MD while in the presence of Alean Stands, MD.  I, Alean Stands MD, have reviewed the above documentation for accuracy and completeness, and I agree with the above.     Alean Stands, MD   7/17/20251:49 PM  CHIEF COMPLAINT:   Diagnosis: metastatic prostate cancer    Cancer Staging  No matching staging information was found for the patient.    Prior Therapy: 1. Docetaxel  x 14 cycles from 08/11/2015 through 06/12/2016. 2. Abiraterone  from 07/13/2016 through 04/09/2019. 3. Enzalutamide  from 04/12/2019 to 11/03/2019. 4. SBRT to nodal mets, 08/27/20 - 09/06/20 5. ADT (Lupron /Eligard ) through 08/06/21 6. Cabazitaxel , 13 cycles, 12/15/21 - 09/08/22 7. Pluvicto , 6 cycles-- 11/16/22, 01/12/23, 02/09/23, 03/23/23, 05/04/23, and 06/15/23  Current Therapy: Docetaxel  and carboplatin    HISTORY OF PRESENT ILLNESS:   Oncology History  Prostate cancer metastatic to intrathoracic lymph node (HCC)  07/24/2015 Initial Diagnosis   Prostate cancer metastatic to the left supraclavicular and anterior mediastinal lymph nodes   08/21/2015 - 06/12/2016 Chemotherapy   Docetaxel  every 3 weeks for 14 cycles, discontinued as PSA has plateaued around 11    07/13/2016 Treatment Plan Change    Zytiga  1000 milligrams daily along with prednisone  5 mg daily, started secondary to fluid overload from docetaxel    10/14/2018 Genetic Testing   Negative genetic testing.  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  The Common Hereditary Gene Panel offered by [facility] includes sequencing and/or deletion duplication testing of the following 48 genes: APC, ATM, AXIN2, BARD1, BMPR1A, BRCA1, BRCA2, BRIP1, CDH1, CDK4, CDKN2A (p14ARF), CDKN2A (p16INK4a), CHEK2, CTNNA1, DICER1, EPCAM (Deletion/duplication testing only), GREM1 (promoter region deletion/duplication testing only), KIT, MEN1, MLH1, MSH2, MSH3, MSH6, MUTYH, NBN, NF1, NHTL1, PALB2, PDGFRA, PMS2, POLD1, POLE, PTEN, RAD50, RAD51C, RAD51D, RNF43, SDHB, SDHC, SDHD, SMAD4, SMARCA4. STK11, TP53, TSC1, TSC2, and VHL.  The following genes were evaluated for sequence changes only: SDHA and HOXB13 c.251G>A variant only. The report date is Oct 14, 2018.   09/18/2019 Genetic Testing   Foundation One CDx      12/12/2019 Genetic Testing   Guardant 360       12/15/2021 - 01/06/2022 Chemotherapy   Patient is on Treatment Plan : PROSTATE Cabazitaxel  + Prednisone  q21d     12/15/2021 - 09/08/2022 Chemotherapy   Patient is on Treatment Plan : PROSTATE Cabazitaxel  (20) D1 + Prednisone  D1-21 q21d     11/18/2023 -  Chemotherapy   Patient is on Treatment Plan : PROSTATE Carboplatin  (AUC 4) + Docetaxel  (60) q21d        INTERVAL HISTORY:   Andre [name] is a 83 y.o. male presenting to clinic  today for follow up of metastatic prostate cancer. He was last seen by me on 11/18/2023.  Today, he states that he is doing well overall. His appetite level is at 100%. His energy level is at 50%. Shreyas is accompanied by his daughter.   He tolerated his last treatment well and denies any worsening tiredness, nausea, or vomiting. He notes stable peripheral neuropathy in the form of on and off tingling in the feet and fingertips.   Hayward had an unintentional weight loss of 2 pounds  and recently began drinking 2 Boosts/Ensure a day. He notes a normal appetite.   PAST MEDICAL HISTORY:   Past Medical History: Past Medical History:  Diagnosis Date   COPD (chronic obstructive pulmonary disease) (HCC)    Diabetes mellitus without complication (HCC)    Family history of ovarian cancer    Family history of prostate cancer    Hypertension    Prostate cancer [facility])     Surgical History: Past Surgical History:  Procedure Laterality Date   GOLD SEED IMPLANT      Social History: Social History   Socioeconomic History   Marital status: Widowed    Spouse name: Not on file   Number of children: Not on file   Years of education: Not on file   Highest education level: Not on file  Occupational History   Not on file  Tobacco Use   Smoking status: Former    Current packs/day: 0.00    Average packs/day: 0.3 packs/day for 60.0 years (15.0 ttl pk-yrs)    Types: Cigarettes    Start date: 06/25/1954    Quit date: 06/25/2014    Years since quitting: 9.4   Smokeless tobacco: Never   Tobacco comments:    smoked since young age, quit in 2016  Substance and Sexual Activity   Alcohol use: Not Currently   Drug use: No   Sexual activity: Not Currently  Other Topics Concern   Not on file  Social History Narrative   Not on file   Social Drivers of Health   Financial Resource Strain: Low Risk  (08/08/2020)   Received from [facility]   Overall Financial Resource Strain (CARDIA)    Difficulty of Paying Living Expenses: Not hard at all  Food Insecurity: No Food Insecurity (09/30/2023)   Hunger Vital Sign    Worried About Running Out of Food in the Last Year: Never true    Ran Out of Food in the Last Year: Never true  Transportation Needs: No Transportation Needs (09/30/2023)   PRAPARE - Administrator, Civil Service (Medical): No    Lack of Transportation (Non-Medical): No  Physical Activity: Insufficiently Active (05/21/2020)   Exercise Vital Sign    Days of  Exercise per Week: 2 days    Minutes of Exercise per Session: 20 min  Stress: No Stress Concern Present (05/21/2020)   [employer] of Occupational Health - Occupational Stress Questionnaire    Feeling of Stress : Not at all  Social Connections: Moderately Isolated (05/21/2020)   Social Connection and Isolation Panel    Frequency of Communication with Friends and Family: More than three times a week    Frequency of Social Gatherings with Friends and Family: More than three times a week    Attends Religious Services: 1 to 4 times per year    Active Member of [employer] or Organizations: No    Attends Banker Meetings: Never    Marital Status: Widowed  Intimate Partner Violence: Not At Risk (09/30/2023)   Humiliation, Afraid, Rape, and Kick questionnaire    Fear of Current or Ex-Partner: No    Emotionally Abused: No    Physically Abused: No    Sexually Abused: No    Family History: Family History  Problem Relation Age of Onset   Ovarian cancer Mother 61   Heart attack Father    Prostate cancer Maternal Uncle 58   Cancer Cousin        pat cousin with unknown cancer   Colon cancer Neg Hx    Pancreatic cancer Neg Hx    Breast cancer Neg Hx     Current Medications:  Current Outpatient Medications:    ACCU-CHEK GUIDE test strip, , Disp: , Rfl:    Accu-Chek Softclix Lancets lancets, , Disp: , Rfl:    acetaminophen  (TYLENOL ) 500 MG tablet, Take 500 mg by mouth every 6 (six) hours as needed for moderate pain., Disp: , Rfl:    albuterol  (VENTOLIN  HFA) 108 (90 Base) MCG/ACT inhaler, INHALE 2 PUFFS BY MOUTH EVERY 4 TO 6 HOURS AS NEEDED, Disp: , Rfl:    amLODipine  (NORVASC ) 2.5 MG tablet, Take 1 tablet (2.5 mg total) by mouth daily as needed., Disp: 30 tablet, Rfl: 0   aspirin 81 MG EC tablet, Take 1 tablet by mouth daily., Disp: , Rfl:    atorvastatin  (LIPITOR) 10 MG tablet, TAKE 1 TABLET BY MOUTH EVERY DAY, Disp: 90 tablet, Rfl: 3   azelastine (ASTELIN) 0.1 % nasal spray,  USE 2 SPRAYS IN EACH NOSTRIL TWICE A DAY FOR ALLERGIC RHINITIS, Disp: , Rfl:    Blood Glucose Monitoring Suppl (ACCU-CHEK GUIDE) w/Device KIT, , Disp: , Rfl:    brompheniramine-pseudoephedrine-DM 30-2-10 MG/5ML syrup, TAKE 5 ML BY MOUTH EVERY 4 HOURS AS NEEDED FOR COUGH, Disp: 120 mL, Rfl: 0   bumetanide  (BUMEX ) 1 MG tablet, Take 1.5 tablets (1.5 mg total) by mouth daily., Disp: 45 tablet, Rfl: 6   CALCIUM PO, Take 600 mg by mouth daily. , Disp: , Rfl:    cetirizine  (ZYRTEC ) 10 MG tablet, Take 1 tablet by mouth daily., Disp: , Rfl:    CHERATUSSIN AC 100-10 MG/5ML syrup, Take 5 mLs by mouth daily as needed. , Disp: , Rfl: 0   Cholecalciferol (VITAMIN D3) 50 MCG (2000 UT) capsule, Take 2,000 Units by mouth daily., Disp: , Rfl:    dicyclomine  (BENTYL ) 20 MG tablet, Take 1 tablet by mouth 3 (three) times daily as needed., Disp: , Rfl:    dorzolamide  (TRUSOPT ) 2 % ophthalmic solution, Place 1 drop into both eyes 3 (three) times daily., Disp: , Rfl:    erythromycin ophthalmic ointment, 3 (three) times daily., Disp: , Rfl:    ferrous sulfate 325 (65 FE) MG tablet, Take 1 tablet by mouth daily., Disp: , Rfl:    fexofenadine (ALLEGRA) 180 MG tablet, Take 1 tablet every day by oral route at bedtime for 90 days., Disp: , Rfl:    fluticasone  (FLONASE ) 50 MCG/ACT nasal spray, USE 2 SPRAY(S) IN EACH NOSTRIL ONCE DAILY FOR 30 DAYS, Disp: 15.8 mL, Rfl: 12   ibuprofen  (ADVIL ,MOTRIN ) 600 MG tablet, Take 600 mg by mouth every 6 (six) hours as needed. , Disp: , Rfl: 0   ipratropium (ATROVENT ) 0.03 % nasal spray, USE 2 SPRAYS IN EACH NOSTRIL TWICE DAILY AS NEEDED, Disp: , Rfl:    Ipratropium-Albuterol  (COMBIVENT) 20-100 MCG/ACT AERS respimat, INHALE 1 PUFF BY MOUTH FOUR TIMES A DAY AS NEEDED COPD, Disp: ,  Rfl:    latanoprost  (XALATAN ) 0.005 % ophthalmic solution, INSTILL 1 DROP INTO AFFECTED EYE(S) BY OPHTHALMIC ROUTE ONCE DAILY INTHE EVENING, Disp: , Rfl:    Latanoprostene Bunod  (VYZULTA ) 0.024 % SOLN, Apply to eye.,  Disp: , Rfl:    Leuprolide  Acetate, 4 Month, (ELIGARD ) 30 MG injection, 30 MG SUBCUTANEOUSLY Q90D PRN, Disp: , Rfl:    losartan  (COZAAR ) 100 MG tablet, Take 1 tablet by mouth daily., Disp: , Rfl:    magnesium  oxide (MAG-OX) 400 (240 Mg) MG tablet, TAKE 1 TABLET BY MOUTH TWICE DAILY, Disp: 180 tablet, Rfl: 3   magnesium  oxide (MAG-OX) 400 MG tablet, Take 1 tablet (400 mg total) by mouth daily. (Patient taking differently: Take 400 mg by mouth 2 (two) times daily.), Disp: 90 tablet, Rfl: 2   megestrol  (MEGACE ) 40 MG/ML suspension, SHAKE LIQUID AND TAKE 10 ML(400 MG) BY MOUTH TWICE DAILY, Disp: 480 mL, Rfl: 0   metFORMIN  (GLUCOPHAGE ) 500 MG tablet, Take 2 tablets (1,000 mg total) by mouth 2 (two) times daily with a meal., Disp: 120 tablet, Rfl: 3   montelukast (SINGULAIR) 10 MG tablet, Take 10 mg by mouth at bedtime., Disp: , Rfl:    Multiple Vitamins-Minerals (PRESERVISION AREDS PO), 1 tablet daily., Disp: , Rfl:    potassium chloride  (MICRO-K ) 10 MEQ CR capsule, TAKE 4 CAPSULE BY MOUTH DAILY WITH FOOD, OPEN AND SPRINKLE ON FOOD, Disp: 348 capsule, Rfl: 2   prochlorperazine  (COMPAZINE ) 10 MG tablet, Take 1 tablet (10 mg total) by mouth every 6 (six) hours as needed for nausea or vomiting., Disp: 60 tablet, Rfl: 3   sucralfate  (CARAFATE ) 1 g tablet, Take 1 tablet by mouth 4 (four) times daily as needed., Disp: , Rfl:    tamsulosin  (FLOMAX ) 0.4 MG CAPS capsule, Take 1 tablet by mouth daily., Disp: , Rfl:    TRELEGY ELLIPTA  200-62.5-25 MCG/ACT AEPB, Take 1 puff by mouth daily., Disp: , Rfl:    triamcinolone cream (KENALOG) 0.1 %, Apply topically 2 (two) times daily., Disp: , Rfl:    vitamin C (ASCORBIC ACID) 500 MG tablet, Take 500 mg by mouth daily., Disp: , Rfl:  No current facility-administered medications for this visit.  Facility-Administered Medications Ordered in Other Visits:    0.9 %  sodium chloride  infusion, , Intravenous, Continuous, Rogers Hai, MD, Last Rate: 300 mL/hr at  12/09/23 1309, Infusion Verify at 12/09/23 1309   denosumab  (XGEVA ) 120 MG/1.7ML injection, , , ,    diphenhydrAMINE  (BENADRYL ) 50 MG/ML injection, , , ,    famotidine  (PEPCID ) 20-0.9 MG/50ML-% IVPB, , , ,    sodium chloride  flush (NS) 0.9 % injection 10 mL, 10 mL, Intracatheter, PRN, Rodriquez Thorner, MD, 10 mL at 12/09/23 1309   Allergies: Allergies  Allergen Reactions   Lisinopril Cough   Metoprolol Other (See Comments) and Cough    Increases frequency of cough Other reaction(s): Other (See Comments) Increases frequency of cough   Semaglutide Nausea And Vomiting    REVIEW OF SYSTEMS:   Review of Systems  Constitutional:  Negative for chills, fatigue and fever.  HENT:   Negative for lump/mass, mouth sores, nosebleeds, sore throat and trouble swallowing.   Eyes:  Negative for eye problems.  Respiratory:  Positive for shortness of breath (with exertion). Negative for cough.   Cardiovascular:  Negative for chest pain, leg swelling and palpitations.  Gastrointestinal:  Negative for abdominal pain, constipation, diarrhea, nausea and vomiting.  Genitourinary:  Negative for bladder incontinence, difficulty urinating, dysuria, frequency, hematuria and nocturia.  Musculoskeletal:  Negative for arthralgias, back pain, flank pain, myalgias and neck pain.  Skin:  Negative for itching and rash.  Neurological:  Positive for dizziness and numbness (in fingers). Negative for headaches.  Hematological:  Does not bruise/bleed easily.  Psychiatric/Behavioral:  Negative for depression, sleep disturbance and suicidal ideas. The patient is not nervous/anxious.   All other systems reviewed and are negative.    VITALS:   Weight 219 lb 2.2 oz (99.4 kg).  Wt Readings from Last 3 Encounters:  12/09/23 219 lb 2.2 oz (99.4 kg)  11/11/23 221 lb (100.2 kg)  09/30/23 220 lb (99.8 kg)    Body mass index is 32.36 kg/m.  Performance status (ECOG): 1 - Symptomatic but completely  ambulatory  PHYSICAL EXAM:   Physical Exam Vitals and nursing note reviewed. Exam conducted with a chaperone present.  Constitutional:      Appearance: Normal appearance.  Cardiovascular:     Rate and Rhythm: Normal rate and regular rhythm.     Pulses: Normal pulses.     Heart sounds: Normal heart sounds.  Pulmonary:     Effort: Pulmonary effort is normal.     Breath sounds: Normal breath sounds.  Abdominal:     Palpations: Abdomen is soft. There is no hepatomegaly, splenomegaly or mass.     Tenderness: There is no abdominal tenderness.  Musculoskeletal:     Right lower leg: No edema.     Left lower leg: No edema.  Lymphadenopathy:     Cervical: No cervical adenopathy.     Right cervical: No superficial, deep or posterior cervical adenopathy.    Left cervical: No superficial, deep or posterior cervical adenopathy.     Upper Body:     Right upper body: No supraclavicular or axillary adenopathy.     Left upper body: No supraclavicular or axillary adenopathy.  Neurological:     General: No focal deficit present.     Mental Status: He is alert and oriented to person, place, and time.  Psychiatric:        Mood and Affect: Mood normal.        Behavior: Behavior normal.     LABS:   CBC     Component Value Date/Time   WBC 8.2 12/09/2023 0822   RBC 3.82 (L) 12/09/2023 0822   HGB 10.3 (L) 12/09/2023 0822   HCT 33.4 (L) 12/09/2023 0822   PLT 235 12/09/2023 0822   MCV 87.4 12/09/2023 0822   MCH 27.0 12/09/2023 0822   MCHC 30.8 12/09/2023 0822   RDW 19.2 (H) 12/09/2023 0822   LYMPHSABS 0.6 (L) 12/09/2023 0822   MONOABS 0.9 12/09/2023 0822   EOSABS 0.0 12/09/2023 0822   BASOSABS 0.1 12/09/2023 0822    CMP      Component Value Date/Time   NA 139 12/09/2023 0822   K 4.1 12/09/2023 0822   CL 100 12/09/2023 0822   CO2 28 12/09/2023 0822   GLUCOSE 144 (H) 12/09/2023 0822   BUN 34 (H) 12/09/2023 0822   CREATININE 1.19 12/09/2023 0822   CALCIUM 9.6 12/09/2023 0822    PROT 6.4 (L) 12/09/2023 0822   ALBUMIN 3.5 12/09/2023 0822   AST 16 12/09/2023 0822   ALT 16 12/09/2023 0822   ALKPHOS 44 12/09/2023 0822   BILITOT 0.7 12/09/2023 0822   GFRNONAA >60 12/09/2023 0822   GFRAA >60 02/06/2020 1349     No results found for: CEA1, CEA / No results found for: CEA1, CEA Lab Results  Component Value Date  PSA1 325.0 (H) 10/22/2023   No results found for: CAN199 No results found for: CAN125  No results found for: STEPHANY RINGS, A1GS, A2GS, BETS, BETA2SER, GAMS, MSPIKE, SPEI Lab Results  Component Value Date   TIBC 323 05/20/2022   TIBC 325 12/15/2021   TIBC 268 10/15/2021   FERRITIN 133 05/20/2022   FERRITIN 117 12/15/2021   FERRITIN 330 10/15/2021   IRONPCTSAT 12 (L) 05/20/2022   IRONPCTSAT 10 (L) 12/15/2021   IRONPCTSAT 23 10/15/2021   No results found for: LDH   STUDIES:   No results found.

## 2023-12-13 ENCOUNTER — Telehealth: Payer: Self-pay | Admitting: *Deleted

## 2023-12-13 NOTE — Telephone Encounter (Signed)
 Patient's daughter Corean called stating that he has started having diarrhea since starting Ensure Complete.  Drinks 2 daily.  Advised that he can use OTC imodium  and increase water intake.  She will update if this is not effective.

## 2023-12-14 ENCOUNTER — Emergency Department (HOSPITAL_COMMUNITY)
Admission: EM | Admit: 2023-12-14 | Discharge: 2023-12-15 | Disposition: A | Discharge status: Home / Self Care | Attending: Emergency Medicine | Admitting: Emergency Medicine

## 2023-12-14 ENCOUNTER — Encounter (HOSPITAL_COMMUNITY): Payer: Self-pay | Admitting: Emergency Medicine

## 2023-12-14 ENCOUNTER — Other Ambulatory Visit: Payer: Self-pay

## 2023-12-14 ENCOUNTER — Emergency Department (HOSPITAL_COMMUNITY)

## 2023-12-14 DIAGNOSIS — E860 Dehydration: Secondary | ICD-10-CM | POA: Insufficient documentation

## 2023-12-14 DIAGNOSIS — D72819 Decreased white blood cell count, unspecified: Secondary | ICD-10-CM | POA: Insufficient documentation

## 2023-12-14 DIAGNOSIS — Z7982 Long term (current) use of aspirin: Secondary | ICD-10-CM | POA: Diagnosis not present

## 2023-12-14 DIAGNOSIS — R197 Diarrhea, unspecified: Secondary | ICD-10-CM | POA: Diagnosis not present

## 2023-12-14 DIAGNOSIS — R42 Dizziness and giddiness: Secondary | ICD-10-CM | POA: Diagnosis present

## 2023-12-14 DIAGNOSIS — Z7984 Long term (current) use of oral hypoglycemic drugs: Secondary | ICD-10-CM | POA: Diagnosis not present

## 2023-12-14 DIAGNOSIS — Z8546 Personal history of malignant neoplasm of prostate: Secondary | ICD-10-CM | POA: Diagnosis not present

## 2023-12-14 DIAGNOSIS — D649 Anemia, unspecified: Secondary | ICD-10-CM | POA: Diagnosis not present

## 2023-12-14 DIAGNOSIS — E119 Type 2 diabetes mellitus without complications: Secondary | ICD-10-CM | POA: Insufficient documentation

## 2023-12-14 DIAGNOSIS — Z79899 Other long term (current) drug therapy: Secondary | ICD-10-CM | POA: Insufficient documentation

## 2023-12-14 DIAGNOSIS — R55 Syncope and collapse: Secondary | ICD-10-CM | POA: Diagnosis not present

## 2023-12-14 DIAGNOSIS — J449 Chronic obstructive pulmonary disease, unspecified: Secondary | ICD-10-CM | POA: Insufficient documentation

## 2023-12-14 DIAGNOSIS — I10 Essential (primary) hypertension: Secondary | ICD-10-CM | POA: Diagnosis not present

## 2023-12-14 LAB — URINALYSIS, ROUTINE W REFLEX MICROSCOPIC
Bilirubin Urine: NEGATIVE
Glucose, UA: NEGATIVE mg/dL
Ketones, ur: NEGATIVE mg/dL
Leukocytes,Ua: NEGATIVE
Nitrite: NEGATIVE
Protein, ur: NEGATIVE mg/dL
Specific Gravity, Urine: 1.011 (ref 1.005–1.030)
pH: 5 (ref 5.0–8.0)

## 2023-12-14 LAB — CBC WITH DIFFERENTIAL/PLATELET
Abs Immature Granulocytes: 0.1 K/uL — ABNORMAL HIGH (ref 0.00–0.07)
Band Neutrophils: 4 %
Basophils Absolute: 0 K/uL (ref 0.0–0.1)
Basophils Relative: 0 %
Eosinophils Absolute: 0 K/uL (ref 0.0–0.5)
Eosinophils Relative: 1 %
HCT: 31.8 % — ABNORMAL LOW (ref 39.0–52.0)
Hemoglobin: 10.1 g/dL — ABNORMAL LOW (ref 13.0–17.0)
Lymphocytes Relative: 14 %
Lymphs Abs: 0.4 K/uL — ABNORMAL LOW (ref 0.7–4.0)
MCH: 28 pg (ref 26.0–34.0)
MCHC: 31.8 g/dL (ref 30.0–36.0)
MCV: 88.1 fL (ref 80.0–100.0)
Metamyelocytes Relative: 3 %
Monocytes Absolute: 0.2 K/uL (ref 0.1–1.0)
Monocytes Relative: 6 %
Myelocytes: 2 %
Neutro Abs: 1.9 K/uL (ref 1.7–7.7)
Neutrophils Relative %: 70 %
Platelets: 172 K/uL (ref 150–400)
RBC: 3.61 MIL/uL — ABNORMAL LOW (ref 4.22–5.81)
RDW: 19.2 % — ABNORMAL HIGH (ref 11.5–15.5)
WBC: 2.5 K/uL — ABNORMAL LOW (ref 4.0–10.5)
nRBC: 0 % (ref 0.0–0.2)

## 2023-12-14 LAB — CBG MONITORING, ED: Glucose-Capillary: 131 mg/dL — ABNORMAL HIGH (ref 70–99)

## 2023-12-14 LAB — COMPREHENSIVE METABOLIC PANEL WITH GFR
ALT: 16 U/L (ref 0–44)
AST: 20 U/L (ref 15–41)
Albumin: 3.4 g/dL — ABNORMAL LOW (ref 3.5–5.0)
Alkaline Phosphatase: 48 U/L (ref 38–126)
Anion gap: 14 (ref 5–15)
BUN: 30 mg/dL — ABNORMAL HIGH (ref 8–23)
CO2: 26 mmol/L (ref 22–32)
Calcium: 9 mg/dL (ref 8.9–10.3)
Chloride: 102 mmol/L (ref 98–111)
Creatinine, Ser: 1.16 mg/dL (ref 0.61–1.24)
GFR, Estimated: >60 mL/min (ref >60)
Glucose, Bld: 141 mg/dL — ABNORMAL HIGH (ref 70–99)
Potassium: 4.4 mmol/L (ref 3.5–5.1)
Sodium: 142 mmol/L (ref 135–145)
Total Bilirubin: 0.9 mg/dL (ref 0.0–1.2)
Total Protein: 6.2 g/dL — ABNORMAL LOW (ref 6.5–8.1)

## 2023-12-14 LAB — LACTIC ACID, PLASMA: Lactic Acid, Venous: 2.8 mmol/L (ref 0.5–1.9)

## 2023-12-14 LAB — TROPONIN I (HIGH SENSITIVITY): Troponin I (High Sensitivity): 16 ng/L (ref <18)

## 2023-12-14 MED ORDER — SODIUM CHLORIDE 0.9 % IV BOLUS
1000.0000 mL | Freq: Once | INTRAVENOUS | Status: AC
  Administered 2023-12-14: 1000 mL via INTRAVENOUS

## 2023-12-14 NOTE — ED Provider Notes (Signed)
 [place] EMERGENCY DEPARTMENT AT [facility] Provider Note   CSN: [id number] Arrival date & time: 12/14/23  2037     Patient presents with: Near Syncope   Andre [name] is a 83 y.o. male.   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  Pt has had some diarrhea since chemo and has not had a good appetite.  He has been feeling dizzy when he stands up and has almost passed out.  Family said bp was in the 90s at home.  Pt denies any cp or sob.  He feels ok just sitting.  No fever.       Prior to Admission medications   Medication Sig Start Date End Date Taking? Authorizing Provider  albuterol  (VENTOLIN  HFA) 108 (90 Base) MCG/ACT inhaler Inhale 2 puffs into the lungs every 4 (four) hours as needed for wheezing or shortness of breath. 07/29/23  Yes [provider]  aspirin 81 MG EC tablet Take 1 tablet by mouth daily. 02/27/21  Yes [provider]  atorvastatin  (LIPITOR) 10 MG tablet TAKE 1 TABLET BY MOUTH EVERY DAY Patient taking differently: Take 5 mg by mouth at bedtime. 01/27/22  Yes Rogers Hai, MD  bumetanide  (BUMEX ) 1 MG tablet Take 1.5 tablets (1.5 mg total) by mouth daily. 09/20/23  Yes Rogers Hai, MD  CALCIUM PO Take 600 mg by mouth daily.    Yes [provider]  Cholecalciferol (VITAMIN D3) 50 MCG (2000 UT) capsule Take 2,000 Units by mouth daily. 05/11/22  Yes [provider]  dorzolamide  (TRUSOPT ) 2 % ophthalmic solution Place 1 drop into both eyes 3 (three) times daily. 06/04/15  Yes [provider]  erythromycin ophthalmic ointment Place 1 Application into both eyes 3 (three) times daily as needed (eye itching). 09/03/22  Yes [provider]  ferrous sulfate 325 (65 FE) MG tablet Take 1 tablet by mouth daily. 02/27/21  Yes [provider]  latanoprost  (XALATAN ) 0.005 % ophthalmic solution Place 1 drop into both eyes  at bedtime.   Yes [provider]  Latanoprostene Bunod  (VYZULTA ) 0.024 % SOLN Place 1 drop into both eyes at bedtime.   Yes [provider]  Leuprolide  Acetate, 4 Month, (ELIGARD ) 30 MG injection Inject 30 mg into the skin every 4 (four) months. 02/27/21  Yes [provider]  loperamide  (IMODIUM  A-D) 2 MG tablet Take 2 mg by mouth 4 (four) times daily as needed for diarrhea or loose stools.   Yes [provider]  losartan  (COZAAR ) 100 MG tablet Take 1 tablet by mouth daily. 02/27/21  Yes [provider]  magnesium  oxide (MAG-OX) 400 MG tablet Take 1 tablet (400 mg total) by mouth daily. Patient taking differently: Take 400 mg by mouth 2 (two) times daily. 03/10/22  Yes Rogers Hai, MD  megestrol  (MEGACE ) 40 MG/ML suspension SHAKE LIQUID AND TAKE 10 ML(400 MG) BY MOUTH TWICE DAILY 12/07/23  Yes Katragadda, Sreedhar, MD  metFORMIN  (GLUCOPHAGE ) 500 MG tablet Take 2 tablets (1,000 mg total) by mouth 2 (two) times daily with a meal. 01/18/23  Yes Rogers Hai, MD  montelukast (SINGULAIR) 10 MG tablet Take 10 mg by mouth at bedtime.   Yes [provider]  Multiple Vitamins-Minerals (PRESERVISION AREDS PO) Take 1 tablet by mouth daily.   Yes [provider]  potassium chloride  (MICRO-K ) 10 MEQ CR capsule TAKE 4 CAPSULE BY MOUTH DAILY WITH FOOD, OPEN  AND SPRINKLE ON FOOD 04/27/23  Yes Rogers Hai, MD  prochlorperazine  (COMPAZINE ) 10 MG tablet Take 1 tablet (10 mg total) by mouth every 6 (six) hours as needed for nausea or vomiting. 12/14/21  Yes Rogers Hai, MD  tamsulosin  (FLOMAX ) 0.4 MG CAPS capsule Take 1 tablet by mouth daily.   Yes [provider]  TRELEGY ELLIPTA  200-62.5-25 MCG/ACT AEPB Take 1 puff by mouth daily. 10/29/23  Yes [provider]  vitamin C (ASCORBIC ACID) 500 MG tablet Take 500 mg by mouth daily.   Yes [provider]  ACCU-CHEK GUIDE test strip  06/12/19   [provider]  Accu-Chek Softclix Lancets lancets  06/12/19   [provider]  Blood Glucose Monitoring Suppl (ACCU-CHEK GUIDE) w/Device KIT  06/12/19   [provider]    Allergies: Semaglutide, Lisinopril, and Metoprolol    Review of Systems  Gastrointestinal:  Positive for diarrhea.  Neurological:  Positive for syncope.  All other systems reviewed and are negative.   Updated Vital Signs BP 119/74   Pulse 86   Temp 98.9 F (37.2 C) (Oral)   Resp 13   Ht 5' 9 (1.753 m)   Wt 99.3 kg   SpO2 100%   BMI 32.34 kg/m   Physical Exam Vitals and nursing note reviewed.  Constitutional:      Appearance: Normal appearance. He is ill-appearing.  HENT:     Head: Normocephalic and atraumatic.     Right Ear: External ear normal.     Left Ear: External ear normal.     Nose: Nose normal.     Mouth/Throat:     Mouth: Mucous membranes are dry.  Eyes:     Extraocular Movements: Extraocular movements intact.     Conjunctiva/sclera: Conjunctivae normal.     Pupils: Pupils are equal, round, and reactive to light.  Cardiovascular:     Rate and Rhythm: Normal rate and regular rhythm.     Pulses: Normal pulses.     Heart sounds: Normal heart sounds.  Pulmonary:     Effort: Pulmonary effort is normal.     Breath sounds: Normal breath sounds.  Abdominal:     General: Abdomen is flat. Bowel sounds are normal.     Palpations: Abdomen is soft.  Musculoskeletal:        General: Normal range of motion.     Cervical back: Normal range of motion and neck supple.  Skin:    General: Skin is warm.     Capillary Refill: Capillary refill takes less than 2 seconds.  Neurological:     General: No focal deficit present.     Mental Status: He is alert and oriented to person, place, and time.  Psychiatric:        Mood and Affect: Mood normal.        Behavior: Behavior normal.     (all labs ordered are listed, but only abnormal results are displayed) Labs Reviewed  CBC WITH  DIFFERENTIAL/PLATELET - Abnormal; Notable for the following components:      Result Value   WBC 2.5 (*)    RBC 3.61 (*)    Hemoglobin 10.1 (*)    HCT 31.8 (*)    RDW 19.2 (*)    Lymphs Abs 0.4 (*)    Abs Immature Granulocytes 0.10 (*)    All other components within normal limits  COMPREHENSIVE METABOLIC PANEL WITH GFR - Abnormal; Notable for the following components:   Glucose, Bld 141 (*)  BUN 30 (*)    Total Protein 6.2 (*)    Albumin 3.4 (*)    All other components within normal limits  LACTIC ACID, PLASMA - Abnormal; Notable for the following components:   Lactic Acid, Venous 2.8 (*)    All other components within normal limits  CULTURE, BLOOD (ROUTINE X 2)  CULTURE, BLOOD (ROUTINE X 2)  URINALYSIS, ROUTINE W REFLEX MICROSCOPIC  LACTIC ACID, PLASMA  CBG MONITORING, ED  TROPONIN I (HIGH SENSITIVITY)  TROPONIN I (HIGH SENSITIVITY)    EKG: None  Radiology: [facility] Chest Port 1 View Result Date: 12/14/2023 CLINICAL DATA:  Near syncope. EXAM: PORTABLE CHEST 1 VIEW COMPARISON:  PET CT 11/04/2023 FINDINGS: Right chest port remains in place. Chronic volume loss in the left hemithorax with small left pleural effusion and basilar opacity. Stable heart size and mediastinal contours. Aortic atherosclerosis. No acute airspace disease. No pneumothorax. IMPRESSION: Chronic volume loss in the left hemithorax with small left pleural effusion and basilar opacity. Electronically Signed   By: Andrea Gasman M.D.   On: 12/14/2023 22:04     Procedures   Medications Ordered in the ED  sodium chloride  0.9 % bolus 1,000 mL (0 mLs Intravenous Stopped 12/14/23 2238)                                    Medical Decision Making Amount and/or Complexity of Data Reviewed Labs: ordered. Radiology: ordered.   This patient presents to the ED for concern of near-syncope, this involves an extensive number of treatment options, and is a complaint that carries with it a high risk of complications and  morbidity.  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  The pertinent results include:  cbc with wbc low at 2.5, hgb low at 10.1 (stable); lactic elevated at 2.8; cmp nl, trop nl   Imaging Studies ordered:  I ordered imaging studies including cxr  I independently visualized and interpreted imaging which showed  Chronic volume loss in the left hemithorax with small left pleural  effusion and basilar opacity.   I agree with the radiologist interpretation   Cardiac Monitoring:  The patient was maintained on a cardiac monitor.  I personally viewed and interpreted the cardiac monitored which showed an underlying rhythm of: nsr   Medicines ordered and prescription drug management:  I ordered medication including ivfs  for sx  Reevaluation of the patient after these medicines showed that the patient improved I have reviewed the patients home medicines and have made adjustments as needed   Test Considered:  ct   Critical Interventions:  ivfs   Problem List / ED Course:  Near syncope:  likely from dehydration due to diarrhea caused by the chemo.  Pt is given IVFs and bp has improved and he feels better.  He is tolerating orals.  Pt's lactic is elevated at 2.8 which I think is also from dehydration as opposed to sepsis.  Plan to recheck after fluids.  If lactic continues to downward trend and pt continues to improve, he can go home.   Reevaluation:  After the interventions noted above, I reevaluated the patient and found that they have :improved   Social Determinants of  Health:  Lives at home   Dispostion: Pending at shift change     Final diagnoses:  Near syncope  Dehydration  Diarrhea, unspecified type    ED Discharge Orders     None          Dean Clarity, MD 12/14/23 2309

## 2023-12-14 NOTE — Discharge Instructions (Addendum)
 You were evaluated in the Emergency Department and after careful evaluation, we did not find any emergent condition requiring admission or further testing in the hospital.  Your exam/testing today was overall reassuring.  Symptoms likely due to dehydration in the setting of diarrhea.  Can use the Imodium  as needed.  Follow-up with your regular doctors.  Please return to the Emergency Department if you experience any worsening of your condition.  Thank you for allowing us  to be a part of your care.

## 2023-12-14 NOTE — ED Provider Notes (Signed)
  Provider Note MRN:  [id number]  Arrival date & time: 12/15/23    ED Course and Medical Decision Making  Assumed care of patient at sign-out or upon transfer.  Dehydration, diarrhea, soft blood pressure on arrival, feeling much better and blood pressure normalizing after fluids.  Plan is for repeat lactic acid, ambulation test, hopeful for discharge.  12:45 AM update: Patient continues to feel well and is requesting to go home.  Ambulating without issue, no lightheadedness.  Blood pressure has been normal for a few hours.  Has not had any fever or signs of active systemic infection during his 4-hour ER visit.  Repeat lactic acid was obtained, slightly elevated from prior.  Suspect it was drawn prior to full administration of the IV fluids.  Clinically he is much improved and I do not see indication for further testing or admission based on these lactic acid levels.  He has follow-up in a couple days with his oncologist.  Appropriate for discharge with return precautions.  Procedures  Final Clinical Impressions(s) / ED Diagnoses     ICD-10-CM   1. Near syncope  R55     2. Dehydration  E86.0     3. Diarrhea, unspecified type  R19.7       ED Discharge Orders          Ordered    loperamide  (IMODIUM ) 2 MG capsule  4 times daily PRN        12/15/23 0047              Discharge Instructions      You were evaluated in the Emergency Department and after careful evaluation, we did not find any emergent condition requiring admission or further testing in the hospital.  Your exam/testing today was overall reassuring.  Symptoms likely due to dehydration in the setting of diarrhea.  Can use the Imodium  as needed.  Follow-up with your regular doctors.  Please return to the Emergency Department if you experience any worsening of your condition.  Thank you for allowing us  to be a part of your care.       Ozell HERO. Theadore, MD [facility] Emergency Medicine [facility]  Health [email or web address]    Theadore Ozell HERO, MD 12/15/23 [phone]

## 2023-12-14 NOTE — ED Triage Notes (Addendum)
 Pt via POV with family after multiple near syncopal events over the past few days, mostly after standing from a seated position. No cardiac history per family. Pt has prostate cancer and is currently receiving [facility] at [facility]. Pt denies nausea, diaphoresis, blurry vision. No CP/SOB.

## 2023-12-15 ENCOUNTER — Telehealth: Payer: Self-pay | Admitting: *Deleted

## 2023-12-15 LAB — LACTIC ACID, PLASMA: Lactic Acid, Venous: 3.2 mmol/L (ref 0.5–1.9)

## 2023-12-15 LAB — TROPONIN I (HIGH SENSITIVITY): Troponin I (High Sensitivity): 17 ng/L (ref <18)

## 2023-12-15 MED ORDER — LOPERAMIDE HCL 2 MG PO CAPS: 2.0000 mg | ORAL_CAPSULE | Freq: Four times a day (QID) | ORAL | 0 refills | Status: AC | PRN

## 2023-12-15 NOTE — ED Notes (Signed)
Pt ambulated independently with cane

## 2023-12-15 NOTE — Telephone Encounter (Signed)
 Patient's daughter, Andre [name] called to advise that he was seen in the ER las evening for dehydration.  Lactic acid was 3.2.  States that he does feel much better at this time.  Dr. Rogers aware and daughter advised to contact us  if he should develop symptoms again and we will arrange for additional fluids.  Verbalized understanding.

## 2023-12-18 DIAGNOSIS — Z9189 Other specified personal risk factors, not elsewhere classified: Secondary | ICD-10-CM | POA: Insufficient documentation

## 2023-12-18 NOTE — Progress Notes (Unsigned)
 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  Current diagnosis: mCRPC Previous treatment as below: Germline testing negative. VUS in PMS2.  Somatic testing: in 2021. MS-stable.  AR amplification.  1.  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 Docetaxel  for 14 cycles discontinued as his PSA plateaued around 11 07/13/2016 - 04/09/2019 Abiraterone  and prednisone  discontinued secondary to PSA progression. 04/12/2019 - 11/03/2019 Enzalutamide  from with progression. 11/10/2019 CT CAP shows no findings of active malignancy.  Stable small sclerotic lesions at T4 and T10, nonspecific.  Right hydronephrosis with right proximal hydroureter extending down to a 0.8 x 0.8 x 0.4 cm proximal ureteral stone at L3 vertical level.  Nonobstructive right and possibly left nephrolithiasis.  11/10/2019 Bone scan shows right proximal tibial metaphyseal activity from recent trauma.  Degenerative findings in the spine, right sternoclavicular joint, shoulders and right foot.  No evidence of metastatic disease.  Foundation 1 test shows MS-stable.  AR amplification.  Sensitivity is reduced due to sample quality. 12/12/2019 Guardant 360 MSI high not detected.  TMB was not evaluable.  No other targetable mutations.  05/15/20 PET scan showed Nodal metastases. Intense radiotracer activity in the left supraclavicular and prevascular lymph nodes, measuring 10 mm.  Cluster of small prevascular lymph nodes measures 5-7 mm each with SUV 54.  AP window lymph node measuring 8 mm.  No activity in the prostate gland or abdominal or pelvic lymph nodes.  No skeletal activity.  08/27/2020 -  09/06/2020 SBRT to the left supraclavicular lymph node and prevascular mediastinal lymph node from, 40 Gray in 5 fractions.  10/22/2021 CT CAP: Done at [facility]: No lymphadenopathy.  Cirrhosis.  Intrahepatic and extrahepatic biliary ductal dilatation. 10/28/2021 Bone scan: New focal abnormal tracer uptake at the inferior aspect of the right scapula.  11/13/2021 PSMA PET scan: Interval development of multifocal tracer avid bone metastasis, only 1 of these lesions is visible on the recent nuclear medicine bone scan dated 10/28/2021.  New tracer avid right axillary, subcarinal, left paratracheal, left retrocrural and upper abdominal lymph nodes.  12/15/2021 - 09/08/2022 13 cycles of cabazitaxel  with progression  09/17/22 PSMA PET. Progression of prostate cancer nodal metastasis. Mild progression of skeletal metastasis.  New radiotracer avid RIGHT level III cervical lymph nodes, RIGHT supraclavicular lymph nodes, RIGHT axillary lymph node, and RIGHT retrocrural lymph nodes. Two new radiotracer avid skeletal metastasis in the RIGHT pelvis. Stable additional multifocal skeletal metastasis.  11/16/2022 - 06/15/2023 Pluvicto  x 6. PSA nadir at 10.82 in Oct 2024.  10/29/2023 Cycle 1 of Xofigo  discontinued secondary to new hepatic metastasis on PSMA PET scan on 11/04/2023. New nodal metastasis above and below the diaphragm and progression of skeletal metastasis. 11/18/23 PSA 574. - Cycle 1 of docetaxel  and carboplatin  on 11/18/2023 - Cycle 2 of docetaxel  and carboplatin  on 12/09/2023  Discussed with patient and daughter today he appears to tolerate previous treatment very well and even with most recent combination chemotherapy.  We went through NCCN guideline. There is not much more systemic treatment per standard of care.  He seemed to recover very well from recent emergency room visit.  The PSA has response after cycle 1.  Likely reasonable to continue as tolerated and monitor PSA for response.  Given  his somatic  testing has been a while, we will repeat liquid biopsy again. He will follow up with Dr. Rogers again with cycle 3 and then see me starting cycle 4 after Dr. Charmain departure. Assessment & Plan Malignant neoplasm of prostate metastatic to bone [facility]) Will obtain NGS He has been started on docetaxel  with carboplatin  with PSA response. Will continue with C3 locally on 8/7 Transfer care here after C3 Follow up with me on 8/28  At risk for side effect of medication Calcium 1200 mg and vit D 1000 international units daily Xgeva  with visit at the end of Aug Essential hypertension, benign Given recent near syncope, recommend hold antihypertensive if SBP <120 and Bumex  if SBP <110 Neuropathy Stable.    Pauletta JAYSON Chihuahua, MD  HPI Patient is seeing me today for the first time due to his local oncologist leaving the practice.  He had experienced multiple lines of therapy.  I have reviewed his medical records.  Clinically, he is doing well.  He recently had near syncopal episode and presented to the ED.  Daughter reported dehydration.  Reported diarrhea after starting nutritional protein shakes.  Blood pressure was low at that time.  He is also on antihypertensive.  He did not require hospitalization and was discharged home after feeling better.  Clinically, reports using a walker, overall tolerated his previous chemotherapy and other treatment pretty well.  Detail treatment records as above.  PHYSICAL EXAMINATION: ECOG PERFORMANCE STATUS: 2 - Symptomatic, <50% confined to bed  Vitals:   12/20/23 1320  BP: (!) 114/56  Pulse: 99  Resp: 17  Temp: 97.7 F (36.5 C)  SpO2: 93%   Filed Weights   12/20/23 1320  Weight: 219 lb 3.2 oz (99.4 kg)    GENERAL: alert, no distress and comfortable on wheelchair OROPHARYNX: no exudate  NECK: No palpable mass LUNGS: clear to auscultation and percussion with normal breathing effort HEART: regular rate & rhythm  ABDOMEN: abdomen soft, non-tender  and nondistended. Musculoskeletal: trace edema  Relevant data reviewed during this visit included labs, pathology, imaging, outside records.

## 2023-12-18 NOTE — Assessment & Plan Note (Addendum)
 Will obtain NGS He has been started on docetaxel  with carboplatin  with PSA response. Will continue with C3 locally on 8/7 Transfer care here after C3 Follow up with me on 8/28

## 2023-12-18 NOTE — Assessment & Plan Note (Addendum)
 Repeat dexa Calcium 1200 mg and vit D 1000 international units daily

## 2023-12-19 LAB — CULTURE, BLOOD (ROUTINE X 2)
Culture: NO GROWTH
Culture: NO GROWTH
Special Requests: ADEQUATE
Special Requests: ADEQUATE

## 2023-12-20 ENCOUNTER — Inpatient Hospital Stay (HOSPITAL_BASED_OUTPATIENT_CLINIC_OR_DEPARTMENT_OTHER)

## 2023-12-20 ENCOUNTER — Inpatient Hospital Stay

## 2023-12-20 ENCOUNTER — Encounter: Payer: Self-pay | Admitting: Hematology

## 2023-12-20 ENCOUNTER — Other Ambulatory Visit: Payer: Self-pay

## 2023-12-20 VITALS — BP 114/56 | HR 99 | Temp 97.7°F | Resp 17 | Ht 69.0 in | Wt 219.2 lb

## 2023-12-20 DIAGNOSIS — C7951 Secondary malignant neoplasm of bone: Secondary | ICD-10-CM | POA: Diagnosis not present

## 2023-12-20 DIAGNOSIS — Z5111 Encounter for antineoplastic chemotherapy: Secondary | ICD-10-CM | POA: Diagnosis not present

## 2023-12-20 DIAGNOSIS — C61 Malignant neoplasm of prostate: Secondary | ICD-10-CM | POA: Diagnosis not present

## 2023-12-20 DIAGNOSIS — G629 Polyneuropathy, unspecified: Secondary | ICD-10-CM | POA: Insufficient documentation

## 2023-12-20 DIAGNOSIS — I10 Essential (primary) hypertension: Secondary | ICD-10-CM

## 2023-12-20 DIAGNOSIS — Z9189 Other specified personal risk factors, not elsewhere classified: Secondary | ICD-10-CM | POA: Diagnosis not present

## 2023-12-20 LAB — MISCELLANEOUS TEST

## 2023-12-20 NOTE — Assessment & Plan Note (Addendum)
 Given recent near syncope, recommend hold antihypertensive if SBP <120 and Bumex  if SBP <110

## 2023-12-20 NOTE — Assessment & Plan Note (Addendum)
 Stable

## 2023-12-24 ENCOUNTER — Encounter (HOSPITAL_COMMUNITY): Payer: Self-pay | Admitting: Hematology

## 2023-12-24 ENCOUNTER — Encounter: Payer: Self-pay | Admitting: Hematology

## 2023-12-24 NOTE — Progress Notes (Signed)
 Introduced myself to the patient, and his daughter, as the prostate nurse navigator. He is here to establish care with Dr. Tina and will be transferring after his next cycle of chemotherapy on 12/30/23.  Provided my contact information to patient and his daughter.  Will continue to follow.

## 2023-12-29 ENCOUNTER — Other Ambulatory Visit: Payer: Self-pay | Admitting: Hematology

## 2023-12-29 DIAGNOSIS — C61 Malignant neoplasm of prostate: Secondary | ICD-10-CM

## 2023-12-30 ENCOUNTER — Encounter (HOSPITAL_COMMUNITY): Payer: Self-pay | Admitting: Hematology

## 2023-12-30 ENCOUNTER — Encounter: Payer: Self-pay | Admitting: Hematology

## 2023-12-30 ENCOUNTER — Inpatient Hospital Stay

## 2023-12-30 ENCOUNTER — Other Ambulatory Visit: Payer: Self-pay

## 2023-12-30 ENCOUNTER — Inpatient Hospital Stay: Admitting: Hematology

## 2023-12-30 VITALS — Wt 216.1 lb

## 2023-12-30 VITALS — BP 115/75 | HR 97 | Temp 97.7°F | Resp 18

## 2023-12-30 DIAGNOSIS — Z5189 Encounter for other specified aftercare: Secondary | ICD-10-CM | POA: Insufficient documentation

## 2023-12-30 DIAGNOSIS — D696 Thrombocytopenia, unspecified: Secondary | ICD-10-CM | POA: Diagnosis not present

## 2023-12-30 DIAGNOSIS — E1142 Type 2 diabetes mellitus with diabetic polyneuropathy: Secondary | ICD-10-CM | POA: Insufficient documentation

## 2023-12-30 DIAGNOSIS — M7989 Other specified soft tissue disorders: Secondary | ICD-10-CM | POA: Diagnosis not present

## 2023-12-30 DIAGNOSIS — C781 Secondary malignant neoplasm of mediastinum: Secondary | ICD-10-CM | POA: Diagnosis not present

## 2023-12-30 DIAGNOSIS — Z79899 Other long term (current) drug therapy: Secondary | ICD-10-CM | POA: Diagnosis not present

## 2023-12-30 DIAGNOSIS — C787 Secondary malignant neoplasm of liver and intrahepatic bile duct: Secondary | ICD-10-CM | POA: Diagnosis not present

## 2023-12-30 DIAGNOSIS — Z7982 Long term (current) use of aspirin: Secondary | ICD-10-CM | POA: Insufficient documentation

## 2023-12-30 DIAGNOSIS — Z87891 Personal history of nicotine dependence: Secondary | ICD-10-CM | POA: Insufficient documentation

## 2023-12-30 DIAGNOSIS — C7951 Secondary malignant neoplasm of bone: Secondary | ICD-10-CM | POA: Diagnosis present

## 2023-12-30 DIAGNOSIS — Z5111 Encounter for antineoplastic chemotherapy: Secondary | ICD-10-CM | POA: Insufficient documentation

## 2023-12-30 DIAGNOSIS — C61 Malignant neoplasm of prostate: Secondary | ICD-10-CM | POA: Diagnosis not present

## 2023-12-30 DIAGNOSIS — Z7984 Long term (current) use of oral hypoglycemic drugs: Secondary | ICD-10-CM | POA: Insufficient documentation

## 2023-12-30 DIAGNOSIS — M8580 Other specified disorders of bone density and structure, unspecified site: Secondary | ICD-10-CM | POA: Diagnosis not present

## 2023-12-30 DIAGNOSIS — J449 Chronic obstructive pulmonary disease, unspecified: Secondary | ICD-10-CM | POA: Insufficient documentation

## 2023-12-30 DIAGNOSIS — I10 Essential (primary) hypertension: Secondary | ICD-10-CM | POA: Diagnosis not present

## 2023-12-30 DIAGNOSIS — E291 Testicular hypofunction: Secondary | ICD-10-CM | POA: Diagnosis not present

## 2023-12-30 LAB — COMPREHENSIVE METABOLIC PANEL WITH GFR
ALT: 15 U/L (ref 0–44)
AST: 18 U/L (ref 15–41)
Albumin: 3.6 g/dL (ref 3.5–5.0)
Alkaline Phosphatase: 40 U/L (ref 38–126)
Anion gap: 15 (ref 5–15)
BUN: 29 mg/dL — ABNORMAL HIGH (ref 8–23)
CO2: 24 mmol/L (ref 22–32)
Calcium: 9.7 mg/dL (ref 8.9–10.3)
Chloride: 102 mmol/L (ref 98–111)
Creatinine, Ser: 1.27 mg/dL — ABNORMAL HIGH (ref 0.61–1.24)
GFR, Estimated: 56 mL/min — ABNORMAL LOW (ref >60)
Glucose, Bld: 94 mg/dL (ref 70–99)
Potassium: 4.2 mmol/L (ref 3.5–5.1)
Sodium: 141 mmol/L (ref 135–145)
Total Bilirubin: 0.7 mg/dL (ref 0.0–1.2)
Total Protein: 6.2 g/dL — ABNORMAL LOW (ref 6.5–8.1)

## 2023-12-30 LAB — CBC WITH DIFFERENTIAL/PLATELET
Abs Immature Granulocytes: 0.07 K/uL (ref 0.00–0.07)
Basophils Absolute: 0 K/uL (ref 0.0–0.1)
Basophils Relative: 0 %
Eosinophils Absolute: 0 K/uL (ref 0.0–0.5)
Eosinophils Relative: 0 %
HCT: 32.7 % — ABNORMAL LOW (ref 39.0–52.0)
Hemoglobin: 10.5 g/dL — ABNORMAL LOW (ref 13.0–17.0)
Immature Granulocytes: 1 %
Lymphocytes Relative: 7 %
Lymphs Abs: 0.6 K/uL — ABNORMAL LOW (ref 0.7–4.0)
MCH: 29.2 pg (ref 26.0–34.0)
MCHC: 32.1 g/dL (ref 30.0–36.0)
MCV: 91.1 fL (ref 80.0–100.0)
Monocytes Absolute: 0.9 K/uL (ref 0.1–1.0)
Monocytes Relative: 11 %
Neutro Abs: 6.6 K/uL (ref 1.7–7.7)
Neutrophils Relative %: 81 %
Platelets: 166 K/uL (ref 150–400)
RBC: 3.59 MIL/uL — ABNORMAL LOW (ref 4.22–5.81)
RDW: 23.2 % — ABNORMAL HIGH (ref 11.5–15.5)
WBC: 8.1 K/uL (ref 4.0–10.5)
nRBC: 0.4 % — ABNORMAL HIGH (ref 0.0–0.2)

## 2023-12-30 LAB — MAGNESIUM: Magnesium: 2.1 mg/dL (ref 1.7–2.4)

## 2023-12-30 LAB — PSA: Prostatic Specific Antigen: 293.62 ng/mL — ABNORMAL HIGH (ref 0.00–4.00)

## 2023-12-30 MED ORDER — SODIUM CHLORIDE 0.9 % IV SOLN
430.0000 mg | Freq: Once | INTRAVENOUS | Status: AC
  Administered 2023-12-30: 430 mg via INTRAVENOUS
  Filled 2023-12-30: qty 43

## 2023-12-30 MED ORDER — APREPITANT 130 MG/18ML IV EMUL
130.0000 mg | Freq: Once | INTRAVENOUS | Status: AC
  Administered 2023-12-30: 130 mg via INTRAVENOUS
  Filled 2023-12-30: qty 18

## 2023-12-30 MED ORDER — PEGFILGRASTIM-CBQV (INF DEV) 6 MG/0.6ML ~~LOC~~ SOSY
6.0000 mg | PREFILLED_SYRINGE | Freq: Once | SUBCUTANEOUS | Status: AC
  Administered 2023-12-30: 6 mg via SUBCUTANEOUS
  Filled 2023-12-30: qty 0.6

## 2023-12-30 MED ORDER — SODIUM CHLORIDE 0.9 % IV SOLN
50.0000 mg/m2 | Freq: Once | INTRAVENOUS | Status: AC
  Administered 2023-12-30: 111 mg via INTRAVENOUS
  Filled 2023-12-30: qty 11.1

## 2023-12-30 MED ORDER — SODIUM CHLORIDE 0.9 % IV SOLN: INTRAVENOUS | Status: DC

## 2023-12-30 MED ORDER — DEXAMETHASONE SODIUM PHOSPHATE 10 MG/ML IJ SOLN
10.0000 mg | Freq: Once | INTRAMUSCULAR | Status: AC
  Administered 2023-12-30: 10 mg via INTRAVENOUS
  Filled 2023-12-30: qty 1

## 2023-12-30 MED ORDER — DENOSUMAB 120 MG/1.7ML ~~LOC~~ SOLN
120.0000 mg | Freq: Once | SUBCUTANEOUS | Status: AC
Start: 2023-12-30 — End: 2023-12-30
  Administered 2023-12-30: 120 mg via SUBCUTANEOUS
  Filled 2023-12-30: qty 1.7

## 2023-12-30 MED ORDER — PALONOSETRON HCL INJECTION 0.25 MG/5ML
0.2500 mg | Freq: Once | INTRAVENOUS | Status: AC
  Administered 2023-12-30: 0.25 mg via INTRAVENOUS
  Filled 2023-12-30: qty 5

## 2023-12-30 NOTE — Progress Notes (Signed)
 Patient okay for treatment with additional 500mL of NS ordered with treatment. Patient tolerated chemotherapy with no complaints voiced. Side effects with management reviewed understanding verbalized. Port site clean and dry with no bruising or swelling noted at site. Good blood return noted before and after administration of chemotherapy. Band aid applied. Patient left in satisfactory condition with VSS and no s/s of distress noted.

## 2023-12-30 NOTE — Patient Instructions (Signed)
 [facility] [place] - A DEPT OF [facility]  Discharge Instructions: Thank you for choosing [facility] to provide your oncology and hematology care.  If you have a lab appointment with the Cancer Center - please note that after April 8th, 2024, all labs will be drawn in the cancer center.  You do not have to check in or register with the main entrance as you have in the past but will complete your check-in in the cancer center.  Wear comfortable clothing and clothing appropriate for easy access to any Portacath or PICC line.   We strive to give you quality time with your provider. You may need to reschedule your appointment if you arrive late (15 or more minutes).  Arriving late affects you and other patients whose appointments are after yours.  Also, if you miss three or more appointments without notifying the office, you may be dismissed from the clinic at the provider's discretion.      For prescription refill requests, have your pharmacy contact our office and allow 72 hours for refills to be completed.    Today you received the following chemotherapy and/or immunotherapy agents Taxol and Carboplatin, return as scheduled.   To help prevent nausea and vomiting after your treatment, we encourage you to take your nausea medication as directed.  BELOW ARE SYMPTOMS THAT SHOULD BE REPORTED IMMEDIATELY: *FEVER GREATER THAN 100.4 F (38 C) OR HIGHER *CHILLS OR SWEATING *NAUSEA AND VOMITING THAT IS NOT CONTROLLED WITH YOUR NAUSEA MEDICATION *UNUSUAL SHORTNESS OF BREATH *UNUSUAL BRUISING OR BLEEDING *URINARY PROBLEMS (pain or burning when urinating, or frequent urination) *BOWEL PROBLEMS (unusual diarrhea, constipation, pain near the anus) TENDERNESS IN MOUTH AND THROAT WITH OR WITHOUT PRESENCE OF ULCERS (sore throat, sores in mouth, or a toothache) UNUSUAL RASH, SWELLING OR PAIN  UNUSUAL VAGINAL DISCHARGE OR ITCHING   Items with * indicate a potential  emergency and should be followed up as soon as possible or go to the Emergency Department if any problems should occur.  Please show the CHEMOTHERAPY ALERT CARD or IMMUNOTHERAPY ALERT CARD at check-in to the Emergency Department and triage nurse.  Should you have questions after your visit or need to cancel or reschedule your appointment, please contact [facility] [place] - A DEPT OF Eligha Brideg[facility] [phone]  and follow the prompts.  Office hours are 8:00 a.m. to 4:30 p.m. Monday - Friday. Please note that voicemails left after 4:00 p.m. may not be returned until the following business day.  We are closed weekends and major holidays. You have access to a nurse at all times for urgent questions. Please call the main number to the clinic [phone] and follow the prompts.  For any non-urgent questions, you may also contact your provider using MyChart. We now offer e-Visits for anyone 64 and older to request care online for non-urgent symptoms. For details visit [email or web address].   Also download the MyChart app! Go to the app store, search "MyChart", open the app, select [place], and log in with your MyChart username and password.

## 2023-12-30 NOTE — Progress Notes (Signed)
 Adjust carboplatin  dose to 430 mg today with creatinine 1.27  Dr Theadore Molt, PharmD

## 2023-12-30 NOTE — Progress Notes (Signed)
 [facility] [address] [place] [address]    Clinic Day:  12/30/2023  Referring physician: Clinic, [facility] Lien  Patient Care Team: Clinic, [facility] as PCP - General Rogers Hai, MD as Medical Oncologist (Medical Oncology) Patrcia Cough, MD as Consulting Physician (Radiation Oncology) Vertell Pont, RN as Oncology Nurse Navigator   ASSESSMENT & PLAN:   Assessment: 1.  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 -Abiraterone  and prednisone  from 07/13/2016 through 04/09/2019, discontinued secondary to PSA progression. -Enzalutamide  from 04/12/2019 through 11/03/2019 with progression. -Last Lupron  on 06/09/2019. -CT CAP on 11/10/2019 shows no findings of active malignancy.  Stable small sclerotic lesions at T4 and T10, nonspecific.  Right hydronephrosis with right proximal hydroureter extending down to a 0.8 x 0.8 x 0.4 cm proximal ureteral stone at L3 vertical level.  Nonobstructive right and possibly left nephrolithiasis. -Bone scan on 11/10/2019 shows right proximal tibial metaphyseal activity from recent trauma.  Degenerative findings in the spine, right sternoclavicular joint, shoulders and right foot.  No evidence of metastatic disease. -Foundation 1 test shows MS-stable.  AR amplification.  Sensitivity is reduced due to sample quality. -Guardant 360 on 12/12/2019 MSI high not detected.  TMB was not evaluable.  No other targetable mutations. -[id number]  PET scan showed intense radiotracer activity in the left supraclavicular and prevascular lymph nodes, measuring 10 mm.  Cluster of small prevascular lymph nodes measures 5-7 mm each with SUV 54.  AP window lymph node measuring 8 mm.  No activity in the prostate gland or abdominal or pelvic lymph nodes.  No skeletal activity. - SBRT to the left  supraclavicular lymph node and prevascular mediastinal lymph node from 08/27/2020 through 09/06/2020, 40 Gray in 5 fractions. - CT CAP (10/22/2021): Done at [facility]: No lymphadenopathy.  Cirrhosis.  Intrahepatic and extrahepatic biliary ductal dilatation. - Bone scan (10/28/2021): New focal abnormal tracer uptake at the inferior aspect of the right scapula. - PSMA PET scan (11/13/2021): Interval development of multifocal tracer avid bone metastasis, only 1 of these lesions is visible on the recent nuclear medicine bone scan dated 10/28/2021.  New tracer avid right axillary, subcarinal, left paratracheal, left retrocrural and upper abdominal lymph nodes. - 13 cycles of cabazitaxel  from 12/15/2021 through 09/08/2022 with progression - Pluvicto  cycle 1 on 11/16/2022, cycle 2 on 01/12/2023, cycle 3 on 02/09/2023, cycle 4 on 03/23/2023, cycle 5 on 05/04/2023 and cycle 6 on 06/15/2023. - Cycle 1 of Xofigo  on 10/29/2023, discontinued secondary to new liver lesions on PSMA PET scan on 11/04/2023. - Cycle 1 of docetaxel  and carboplatin  on 11/18/2023   2.  Androgen deprivation therapy induced bone loss: -Received Zometa  every 3 months for a year completed on 10/29/2017.   3.  Genetic testing: -Germline mutation testing was negative.    Plan: 1.  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 - He has received 2 cycles of docetaxel  and carboplatin , last treatment cycle 2 on 12/09/2023. - He felt tired for 4 days after treatment.  No specific GI side effects other than decreased taste.  He could not tolerate Ensure due to diarrhea.  He is taking Megace  twice daily which is helping with appetite. - Labs today: Creatinine 1.27, higher than his baseline.  CBC was normal.  Last PSA has trended down to 447.  PSA from today is  pending. - He may proceed with cycle 3 today.  Will give him 500 mL normal saline for his elevated creatinine. - He met with Dr. Tina.  He will follow-up with him in 3 weeks prior to cycle 4.   2.  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  Continue denosumab  every 6 weeks.   3.  Leg swelling: - Continue Bumex  daily as needed.  Swelling in the legs is stable.   4.  Peripheral neuropathy: - Numbness in the feet is stable.  No worsening since the docetaxel  started.  5.  Hypomagnesemia: - Continue magnesium  twice daily.  Magnesium  is normal.    No orders of the defined types were placed in this encounter.     Andre [name],acting as a Neurosurgeon for Alean Stands, MD.,have documented all relevant documentation on the behalf of Alean Stands, MD,as directed by  Alean Stands, MD while in the presence of Alean Stands, MD.  I, Alean Stands MD, have reviewed the above documentation for accuracy and completeness, and I agree with the above.      Alean Stands, MD   8/7/20252:27 PM  CHIEF COMPLAINT:   Diagnosis: metastatic prostate cancer    Cancer Staging  No matching staging information was found for the patient.    Prior Therapy: 1. Docetaxel  x 14 cycles from 08/11/2015 through 06/12/2016. 2. Abiraterone  from 07/13/2016 through 04/09/2019. 3. Enzalutamide  from 04/12/2019 to 11/03/2019. 4. SBRT to nodal mets, 08/27/20 - 09/06/20 5. ADT (Lupron /Eligard ) through 08/06/21 6. Cabazitaxel , 13 cycles, 12/15/21 - 09/08/22 7. Pluvicto , 6 cycles-- 11/16/22, 01/12/23, 02/09/23, 03/23/23, 05/04/23, and 06/15/23  Current Therapy: Docetaxel  and carboplatin    HISTORY OF PRESENT ILLNESS:   Oncology History  Prostate cancer metastatic to intrathoracic lymph node (HCC)  07/24/2015 Initial Diagnosis   Prostate cancer metastatic to the left supraclavicular and anterior mediastinal lymph nodes   08/21/2015 - 06/12/2016 Chemotherapy    Docetaxel  every 3 weeks for 14 cycles, discontinued as PSA has plateaued around 11    07/13/2016 Treatment Plan Change   Zytiga  1000 milligrams daily along with prednisone  5 mg daily, started secondary to fluid overload from docetaxel    10/14/2018 Genetic Testing   Negative genetic testing.  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  The Common Hereditary Gene Panel offered by [facility] includes sequencing and/or deletion duplication testing of the following 48 genes: APC, ATM, AXIN2, BARD1, BMPR1A, BRCA1, BRCA2, BRIP1, CDH1, CDK4, CDKN2A (p14ARF), CDKN2A (p16INK4a), CHEK2, CTNNA1, DICER1, EPCAM (Deletion/duplication testing only), GREM1 (promoter region deletion/duplication testing only), KIT, MEN1, MLH1, MSH2, MSH3, MSH6, MUTYH, NBN, NF1, NHTL1, PALB2, PDGFRA, PMS2, POLD1, POLE, PTEN, RAD50, RAD51C, RAD51D, RNF43, SDHB, SDHC, SDHD, SMAD4, SMARCA4. STK11, TP53, TSC1, TSC2, and VHL.  The following genes were evaluated for sequence changes only: SDHA and HOXB13 c.251G>A variant only. The report date is Oct 14, 2018.   09/18/2019 Genetic Testing   Foundation One CDx      12/12/2019 Genetic Testing   Guardant 360       12/15/2021 - 01/06/2022 Chemotherapy   Patient is on Treatment Plan : PROSTATE Cabazitaxel  + Prednisone  q21d     12/15/2021 - 09/08/2022 Chemotherapy   Patient is on Treatment Plan : PROSTATE Cabazitaxel  (20) D1 + Prednisone  D1-21 q21d     11/18/2023 -  Chemotherapy   Patient is on Treatment Plan : PROSTATE Carboplatin  (AUC 4) + Docetaxel  (60) q21d  INTERVAL HISTORY:   Andre [name] is a 83 y.o. male presenting to clinic today for follow up of metastatic prostate cancer. He was last seen by me on 12/09/2023.  Today, he states that he is doing well overall. His appetite level is at 100%. His energy level is at 75%. Andre [name] is accompanied by his daughter.   He tolerated his second cycle of carboplatin  and docetaxel  well. He notes tiredness lasting 4 days after treatment. His appetite  and p.o. intake improved with Megace . He is no longer drinking Boosts/Ensure as they caused diarrhea. He has been in contact with nutrition, who will reportedly start him on a jelly for protein. His peripheral neuropathy is stable.   Andre [name] is taking Bumex  and calcium supplements as prescribed. He was seen by Dr. Tina on 12/20/2023 who recommended holding losartan  if systolic BP is below 120 and holding Bumex  is systolic BP is below 110.   PAST MEDICAL HISTORY:   Past Medical History: Past Medical History:  Diagnosis Date   COPD (chronic obstructive pulmonary disease) (HCC)    Diabetes mellitus without complication (HCC)    Family history of ovarian cancer    Family history of prostate cancer    Hypertension    Prostate cancer [facility])     Surgical History: Past Surgical History:  Procedure Laterality Date   GOLD SEED IMPLANT      Social History: Social History   Socioeconomic History   Marital status: Widowed    Spouse name: Not on file   Number of children: Not on file   Years of education: Not on file   Highest education level: Not on file  Occupational History   Not on file  Tobacco Use   Smoking status: Former    Current packs/day: 0.00    Average packs/day: 0.3 packs/day for 60.0 years (15.0 ttl pk-yrs)    Types: Cigarettes    Start date: 06/25/1954    Quit date: 06/25/2014    Years since quitting: 9.5   Smokeless tobacco: Never   Tobacco comments:    smoked since young age, quit in 2016  Substance and Sexual Activity   Alcohol use: Not Currently   Drug use: No   Sexual activity: Not Currently  Other Topics Concern   Not on file  Social History Narrative   Not on file   Social Drivers of Health   Financial Resource Strain: Low Risk  (08/08/2020)   Received from [facility]   Overall Financial Resource Strain (CARDIA)    Difficulty of Paying Living Expenses: Not hard at all  Food Insecurity: No Food Insecurity (09/30/2023)   Hunger Vital Sign    Worried  About Running Out of Food in the Last Year: Never true    Ran Out of Food in the Last Year: Never true  Transportation Needs: No Transportation Needs (09/30/2023)   PRAPARE - Administrator, Civil Service (Medical): No    Lack of Transportation (Non-Medical): No  Physical Activity: Insufficiently Active (05/21/2020)   Exercise Vital Sign    Days of Exercise per Week: 2 days    Minutes of Exercise per Session: 20 min  Stress: No Stress Concern Present (05/21/2020)   [employer] of Occupational Health - Occupational Stress Questionnaire    Feeling of Stress : Not at all  Social Connections: Moderately Isolated (05/21/2020)   Social Connection and Isolation Panel    Frequency of Communication with Friends and Family: More than three times a week  Frequency of Social Gatherings with Friends and Family: More than three times a week    Attends Religious Services: 1 to 4 times per year    Active Member of [employer] or Organizations: No    Attends Banker Meetings: Never    Marital Status: Widowed  Intimate Partner Violence: Not At Risk (09/30/2023)   Humiliation, Afraid, Rape, and Kick questionnaire    Fear of Current or Ex-Partner: No    Emotionally Abused: No    Physically Abused: No    Sexually Abused: No    Family History: Family History  Problem Relation Age of Onset   Ovarian cancer Mother 37   Heart attack Father    Prostate cancer Maternal Uncle 40   Cancer Cousin        pat cousin with unknown cancer   Colon cancer Neg Hx    Pancreatic cancer Neg Hx    Breast cancer Neg Hx     Current Medications:  Current Outpatient Medications:    ACCU-CHEK GUIDE test strip, , Disp: , Rfl:    Accu-Chek Softclix Lancets lancets, , Disp: , Rfl:    albuterol  (VENTOLIN  HFA) 108 (90 Base) MCG/ACT inhaler, Inhale 2 puffs into the lungs every 4 (four) hours as needed for wheezing or shortness of breath., Disp: , Rfl:    aspirin 81 MG EC tablet, Take 1 tablet by  mouth daily., Disp: , Rfl:    atorvastatin  (LIPITOR) 10 MG tablet, TAKE 1 TABLET BY MOUTH EVERY DAY, Disp: 90 tablet, Rfl: 3   Blood Glucose Monitoring Suppl (ACCU-CHEK GUIDE) w/Device KIT, , Disp: , Rfl:    bumetanide  (BUMEX ) 1 MG tablet, TAKE 1 AND 1/2 TABLETS EVERY DAY (SUBSTITUTED FOR BUMEX ), Disp: 135 tablet, Rfl: 3   CALCIUM PO, Take 600 mg by mouth daily. , Disp: , Rfl:    Cholecalciferol (VITAMIN D3) 50 MCG (2000 UT) capsule, Take 2,000 Units by mouth daily., Disp: , Rfl:    dorzolamide  (TRUSOPT ) 2 % ophthalmic solution, Place 1 drop into both eyes 3 (three) times daily., Disp: , Rfl:    erythromycin ophthalmic ointment, Place 1 Application into both eyes 3 (three) times daily as needed (eye itching)., Disp: , Rfl:    ferrous sulfate 325 (65 FE) MG tablet, Take 1 tablet by mouth daily., Disp: , Rfl:    latanoprost  (XALATAN ) 0.005 % ophthalmic solution, Place 1 drop into both eyes at bedtime., Disp: , Rfl:    Latanoprostene Bunod  (VYZULTA ) 0.024 % SOLN, Place 1 drop into both eyes at bedtime., Disp: , Rfl:    Leuprolide  Acetate, 4 Month, (ELIGARD ) 30 MG injection, Inject 30 mg into the skin every 4 (four) months., Disp: , Rfl:    loperamide  (IMODIUM ) 2 MG capsule, Take 1 capsule (2 mg total) by mouth 4 (four) times daily as needed for diarrhea or loose stools., Disp: 12 capsule, Rfl: 0   losartan  (COZAAR ) 100 MG tablet, Take 1 tablet by mouth daily., Disp: , Rfl:    magnesium  oxide (MAG-OX) 400 MG tablet, Take 1 tablet (400 mg total) by mouth daily., Disp: 90 tablet, Rfl: 2   megestrol  (MEGACE ) 40 MG/ML suspension, SHAKE LIQUID AND TAKE 10 ML(400 MG) BY MOUTH TWICE DAILY, Disp: 480 mL, Rfl: 0   metFORMIN  (GLUCOPHAGE ) 500 MG tablet, Take 2 tablets (1,000 mg total) by mouth 2 (two) times daily with a meal., Disp: 120 tablet, Rfl: 3   montelukast (SINGULAIR) 10 MG tablet, Take 10 mg by mouth at bedtime., Disp: ,  Rfl:    Multiple Vitamins-Minerals (PRESERVISION AREDS PO), Take 1 tablet by mouth  daily., Disp: , Rfl:    potassium chloride  (MICRO-K ) 10 MEQ CR capsule, TAKE 4 CAPSULE BY MOUTH DAILY WITH FOOD, OPEN AND SPRINKLE ON FOOD, Disp: 348 capsule, Rfl: 2   prochlorperazine  (COMPAZINE ) 10 MG tablet, Take 1 tablet (10 mg total) by mouth every 6 (six) hours as needed for nausea or vomiting., Disp: 60 tablet, Rfl: 3   tamsulosin  (FLOMAX ) 0.4 MG CAPS capsule, Take 1 tablet by mouth daily., Disp: , Rfl:    TRELEGY ELLIPTA  200-62.5-25 MCG/ACT AEPB, Take 1 puff by mouth daily., Disp: , Rfl:    vitamin C (ASCORBIC ACID) 500 MG tablet, Take 500 mg by mouth daily., Disp: , Rfl:  No current facility-administered medications for this visit.  Facility-Administered Medications Ordered in Other Visits:    0.9 %  sodium chloride  infusion, , Intravenous, Continuous, Rogers Hai, MD, Stopped at 12/30/23 1218   0.9 %  sodium chloride  infusion, , Intravenous, Continuous, Rogers Hai, MD, Stopped at 12/30/23 1407   denosumab  (XGEVA ) 120 MG/1.7ML injection, , , ,    diphenhydrAMINE  (BENADRYL ) 50 MG/ML injection, , , ,    famotidine  (PEPCID ) 20-0.9 MG/50ML-% IVPB, , , ,    Allergies: Allergies  Allergen Reactions   Semaglutide Nausea And Vomiting   Lisinopril Cough   Metoprolol Cough    Increases frequency of cough    REVIEW OF SYSTEMS:   Review of Systems  Constitutional:  Negative for chills, fatigue and fever.  HENT:   Negative for lump/mass, mouth sores, nosebleeds, sore throat and trouble swallowing.   Eyes:  Negative for eye problems.  Respiratory:  Negative for cough and shortness of breath.   Cardiovascular:  Negative for chest pain, leg swelling and palpitations.  Gastrointestinal:  Positive for constipation. Negative for abdominal pain, diarrhea, nausea and vomiting.  Genitourinary:  Negative for bladder incontinence, difficulty urinating, dysuria, frequency, hematuria and nocturia.   Musculoskeletal:  Negative for arthralgias, back pain, flank pain, myalgias and  neck pain.  Skin:  Negative for itching and rash.  Neurological:  Positive for numbness (in fingers). Negative for dizziness and headaches.  Hematological:  Does not bruise/bleed easily.  Psychiatric/Behavioral:  Negative for depression, sleep disturbance and suicidal ideas. The patient is not nervous/anxious.   All other systems reviewed and are negative.    VITALS:   Weight 216 lb 0.8 oz (98 kg).  Wt Readings from Last 3 Encounters:  12/30/23 216 lb 0.8 oz (98 kg)  12/20/23 219 lb 3.2 oz (99.4 kg)  12/14/23 219 lb (99.3 kg)    Body mass index is 31.91 kg/m.  Performance status (ECOG): 1 - Symptomatic but completely ambulatory  PHYSICAL EXAM:   Physical Exam Vitals and nursing note reviewed. Exam conducted with a chaperone present.  Constitutional:      Appearance: Normal appearance.  Cardiovascular:     Rate and Rhythm: Normal rate and regular rhythm.     Pulses: Normal pulses.     Heart sounds: Normal heart sounds.  Pulmonary:     Effort: Pulmonary effort is normal.     Breath sounds: Normal breath sounds.  Abdominal:     Palpations: Abdomen is soft. There is no hepatomegaly, splenomegaly or mass.     Tenderness: There is no abdominal tenderness.  Musculoskeletal:     Right lower leg: No edema.     Left lower leg: No edema.  Lymphadenopathy:     Cervical: No cervical  adenopathy.     Right cervical: No superficial, deep or posterior cervical adenopathy.    Left cervical: No superficial, deep or posterior cervical adenopathy.     Upper Body:     Right upper body: No supraclavicular or axillary adenopathy.     Left upper body: No supraclavicular or axillary adenopathy.  Neurological:     General: No focal deficit present.     Mental Status: He is alert and oriented to person, place, and time.  Psychiatric:        Mood and Affect: Mood normal.        Behavior: Behavior normal.     LABS:   CBC     Component Value Date/Time   WBC 8.1 12/30/2023 0901   RBC  3.59 (L) 12/30/2023 0901   HGB 10.5 (L) 12/30/2023 0901   HCT 32.7 (L) 12/30/2023 0901   PLT 166 12/30/2023 0901   MCV 91.1 12/30/2023 0901   MCH 29.2 12/30/2023 0901   MCHC 32.1 12/30/2023 0901   RDW 23.2 (H) 12/30/2023 0901   LYMPHSABS 0.6 (L) 12/30/2023 0901   MONOABS 0.9 12/30/2023 0901   EOSABS 0.0 12/30/2023 0901   BASOSABS 0.0 12/30/2023 0901    CMP      Component Value Date/Time   NA 141 12/30/2023 0901   K 4.2 12/30/2023 0901   CL 102 12/30/2023 0901   CO2 24 12/30/2023 0901   GLUCOSE 94 12/30/2023 0901   BUN 29 (H) 12/30/2023 0901   CREATININE 1.27 (H) 12/30/2023 0901   CALCIUM 9.7 12/30/2023 0901   PROT 6.2 (L) 12/30/2023 0901   ALBUMIN 3.6 12/30/2023 0901   AST 18 12/30/2023 0901   ALT 15 12/30/2023 0901   ALKPHOS 40 12/30/2023 0901   BILITOT 0.7 12/30/2023 0901   GFRNONAA 56 (L) 12/30/2023 0901   GFRAA >60 02/06/2020 1349     No results found for: CEA1, CEA / No results found for: CEA1, CEA Lab Results  Component Value Date   PSA1 325.0 (H) 10/22/2023   No results found for: CAN199 No results found for: CAN125  No results found for: STEPHANY RINGS, A1GS, A2GS, EARLA BABCOCK, GAMS, MSPIKE, SPEI Lab Results  Component Value Date   TIBC 323 05/20/2022   TIBC 325 12/15/2021   TIBC 268 10/15/2021   FERRITIN 133 05/20/2022   FERRITIN 117 12/15/2021   FERRITIN 330 10/15/2021   IRONPCTSAT 12 (L) 05/20/2022   IRONPCTSAT 10 (L) 12/15/2021   IRONPCTSAT 23 10/15/2021   No results found for: LDH   STUDIES:   DG Chest Port 1 View Result Date: 12/14/2023 CLINICAL DATA:  Near syncope. EXAM: PORTABLE CHEST 1 VIEW COMPARISON:  PET CT 11/04/2023 FINDINGS: Right chest port remains in place. Chronic volume loss in the left hemithorax with small left pleural effusion and basilar opacity. Stable heart size and mediastinal contours. Aortic atherosclerosis. No acute airspace disease. No pneumothorax. IMPRESSION: Chronic  volume loss in the left hemithorax with small left pleural effusion and basilar opacity. Electronically Signed   By: Andrea Gasman M.D.   On: 12/14/2023 22:04

## 2023-12-30 NOTE — Patient Instructions (Addendum)
[facility]  Discharge Instructions  You were seen and examined today by Dr. Ellin Saba.  Dr. Ellin Saba discussed your most recent lab work which revealed that everything looks good and stable.  Follow-up as scheduled.    Thank you for choosing [facility] to provide your oncology and hematology care.   To afford each patient quality time with our provider, please arrive at least 15 minutes before your scheduled appointment time. You may need to reschedule your appointment if you arrive late (10 or more minutes). Arriving late affects you and other patients whose appointments are after yours.  Also, if you miss three or more appointments without notifying the office, you may be dismissed from the clinic at the provider's discretion.    Again, thank you for choosing [facility].  Our hope is that these requests will decrease the amount of time that you wait before being seen by our physicians.   If you have a lab appointment with the Cancer Center - please note that after April 8th, all labs will be drawn in the cancer center.  You do not have to check in or register with the main entrance as you have in the past but will complete your check-in at the cancer center.            _____________________________________________________________  Should you have questions after your visit to [facility], please contact our office at [phone] and follow the prompts.  Our office hours are 8:00 a.m. to 4:30 p.m. Monday - Thursday and 8:00 a.m. to 2:30 p.m. Friday.  Please note that voicemails left after 4:00 p.m. may not be returned until the following business day.  We are closed weekends and all major holidays.  You do have access to a nurse 24-7, just call the main number to the clinic [phone] and do not press any options, hold on the line and a nurse will answer the phone.    For prescription refill requests,  have your pharmacy contact our office and allow 72 hours.    Masks are no longer required in the cancer centers. If you would like for your care team to wear a mask while they are taking care of you, please let them know. You may have one support person who is at least 85 years old accompany you for your appointments.

## 2023-12-31 ENCOUNTER — Encounter: Payer: Self-pay | Admitting: Hematology

## 2024-01-03 ENCOUNTER — Encounter: Admitting: Dietician

## 2024-01-04 ENCOUNTER — Other Ambulatory Visit: Payer: Self-pay

## 2024-01-04 DIAGNOSIS — C61 Malignant neoplasm of prostate: Secondary | ICD-10-CM

## 2024-01-04 NOTE — Addendum Note (Signed)
 Addended by: TINA HUDSON on: 01/04/2024 02:39 PM   Modules accepted: Orders

## 2024-01-06 NOTE — Progress Notes (Signed)
 RN spoke with patient's daughter to ensure they are aware of upcoming appointments and assess any new barriers. No needs at this time.   Patient is established with a treatment plan and is actively engaged in care. Nurse Navigator services not currently indicated at this time. Will re-evaluate if needs change or if additional support is requested.

## 2024-01-19 NOTE — Assessment & Plan Note (Addendum)
 Proceed with treatment today.

## 2024-01-19 NOTE — Assessment & Plan Note (Addendum)
 Given recent near syncope, recommend hold antihypertensive if SBP <120 and Bumex  if SBP <110

## 2024-01-19 NOTE — Assessment & Plan Note (Addendum)
 Stable

## 2024-01-19 NOTE — Assessment & Plan Note (Addendum)
 Calcium 1200 mg and vit D 1000 international units daily Xgeva  with visit next visit

## 2024-01-19 NOTE — Progress Notes (Unsigned)
 [facility] OFFICE PROGRESS NOTE  Patient Care Team: Clinic, Bonni Lien as PCP - General Patrcia Cough, MD as Consulting Physician (Radiation Oncology) Vertell Pont, RN as Oncology Nurse Navigator  83 y.o.m with history of mCRPC s/p multiple lines of therapy transferred care to us .   Current diagnosis: mCRPC Previous treatment as below: Germline testing negative. VUS in PMS2.  Somatic testing: in 2021. MS-stable.  AR amplification. Somatic testing 2025: CHEK2.  1.  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 Docetaxel  for 14 cycles discontinued as his PSA plateaued around 11 07/13/2016 - 04/09/2019 Abiraterone  and prednisone  discontinued secondary to PSA progression. 04/12/2019 - 11/03/2019 Enzalutamide  from with progression. 11/10/2019 CT CAP shows no findings of active malignancy.  Stable small sclerotic lesions at T4 and T10, nonspecific.  Right hydronephrosis with right proximal hydroureter extending down to a 0.8 x 0.8 x 0.4 cm proximal ureteral stone at L3 vertical level.  Nonobstructive right and possibly left nephrolithiasis.   11/10/2019 Bone scan shows right proximal tibial metaphyseal activity from recent trauma.  Degenerative findings in the spine, right sternoclavicular joint, shoulders and right foot.  No evidence of metastatic disease.   Foundation 1 test shows MS-stable.  AR amplification.  Sensitivity is reduced due to sample quality. 12/12/2019 Guardant 360 MSI high not detected.  TMB was not evaluable.  No other targetable mutations.   05/15/20 PET scan showed Nodal metastases. Intense radiotracer activity in the left supraclavicular and prevascular lymph nodes, measuring 10 mm.  Cluster of small prevascular lymph nodes measures 5-7 mm each with SUV 54.  AP window lymph node measuring 8 mm.  No activity in the prostate gland or abdominal or pelvic lymph nodes.  No skeletal activity.    08/27/2020 - 09/06/2020 SBRT to the left supraclavicular lymph node and prevascular mediastinal lymph node from, 40 Gray in 5 fractions.   10/22/2021 CT CAP: Done at [facility]: No lymphadenopathy.  Cirrhosis.  Intrahepatic and extrahepatic biliary ductal dilatation. 10/28/2021 Bone scan: New focal abnormal tracer uptake at the inferior aspect of the right scapula.   11/13/2021 PSMA PET scan: Interval development of multifocal tracer avid bone metastasis, only 1 of these lesions is visible on the recent nuclear medicine bone scan dated 10/28/2021.  New tracer avid right axillary, subcarinal, left paratracheal, left retrocrural and upper abdominal lymph nodes.   12/15/2021 - 09/08/2022 13 cycles of cabazitaxel  with progression   09/17/22 PSMA PET. Progression of prostate cancer nodal metastasis. Mild progression of skeletal metastasis.  New radiotracer avid RIGHT level III cervical lymph nodes, RIGHT supraclavicular lymph nodes, RIGHT axillary lymph node, and RIGHT retrocrural lymph nodes. Two new radiotracer avid skeletal metastasis in the RIGHT pelvis. Stable additional multifocal skeletal metastasis.   11/16/2022 - 06/15/2023 Pluvicto  x 6. PSA nadir at 10.82 in Oct 2024.   10/29/2023 Cycle 1 of Xofigo  discontinued secondary to new hepatic metastasis on PSMA PET scan on 11/04/2023. New nodal metastasis above and below the diaphragm and progression of skeletal metastasis. 11/18/23 PSA 574. - Cycle 1 of docetaxel  and carboplatin  on 11/18/2023 - Cycle 2 of docetaxel  and carboplatin  on 12/09/2023 - Cycle 3 of docetaxel  and carboplatin  on 12/30/2023   Few episodes of epistaxis since last cycle.  Platelets 95 today.  Otherwise clinically feeling well.  Discussed we will postpone treatment due to thrombocytopenia.  Explained reasons and they understand.  Patient will return in 3 weeks.  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 Return in 3 weeks  with repeat labs Essential hypertension, benign Given recent near syncope, recommend hold losartan  if SBP <120 and Bumex  if SBP <110 Neuropathy Stable. At risk for side effect of medication Calcium 1200 mg and vit D 1000 international units daily Xgeva  with visit next visit Thrombocytopenia (HCC) Hold treatment today Return in 3 weeks with repeat labs  Orders Placed This Encounter  Procedures   CBC with Differential (Cancer Center Only)    Standing Status:   Future    Expected Date:   03/02/2024    Expiration Date:   03/02/2025   CMP (Cancer Center only)    Standing Status:   Future    Expected Date:   03/02/2024    Expiration Date:   03/02/2025   Lactate dehydrogenase    Standing Status:   Future    Expected Date:   03/02/2024    Expiration Date:   03/02/2025   Prostate-Specific AG, Serum    Standing Status:   Future    Expected Date:   03/02/2024    Expiration Date:   03/02/2025   Testosterone     Standing Status:   Future    Expected Date:   03/02/2024    Expiration Date:   03/02/2025   CEA (Access)    Standing Status:   Future    Expected Date:   03/02/2024    Expiration Date:   03/02/2025   CBC with Differential (Cancer Center Only)    Standing Status:   Future    Expected Date:   02/10/2024    Expiration Date:   02/09/2025   CMP (Cancer Center only)    Standing Status:   Future    Expected Date:   02/10/2024    Expiration Date:   02/09/2025   Lactate dehydrogenase    Standing Status:   Future    Expected Date:   02/10/2024    Expiration Date:   02/09/2025   Prostate-Specific AG, Serum    Standing Status:   Future    Expected Date:   02/10/2024    Expiration Date:   02/09/2025   Testosterone     Standing Status:   Future    Expected Date:   02/10/2024    Expiration Date:   02/09/2025   CEA (Access)    Standing Status:   Future    Expected Date:   02/10/2024    Expiration Date:   02/09/2025     Pauletta JAYSON Chihuahua, MD  INTERVAL HISTORY: Patient returns for  follow-up.  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  The Common Hereditary Gene Panel offered by [facility] includes sequencing and/or deletion duplication testing of the following 48 genes: APC, ATM, AXIN2, BARD1, BMPR1A, BRCA1, BRCA2, BRIP1, CDH1, CDK4, CDKN2A (p14ARF), CDKN2A (p16INK4a), CHEK2, CTNNA1, DICER1, EPCAM (Deletion/duplication testing only), GREM1 (promoter region deletion/duplication testing only), KIT, MEN1, MLH1, MSH2, MSH3, MSH6, MUTYH, NBN, NF1, NHTL1, PALB2, PDGFRA, PMS2, POLD1, POLE, PTEN, RAD50, RAD51C, RAD51D, RNF43,  SDHB, SDHC, SDHD, SMAD4, SMARCA4. STK11, TP53, TSC1, TSC2, and VHL.  The following genes were evaluated for sequence changes only: SDHA and HOXB13 c.251G>A variant only. The report date is Oct 14, 2018.   09/18/2019 Genetic Testing   Foundation One CDx      12/12/2019 Genetic Testing   Guardant 360       12/15/2021 - 01/06/2022 Chemotherapy   Patient is on Treatment Plan : PROSTATE Cabazitaxel  + Prednisone  q21d     12/15/2021 - 09/08/2022 Chemotherapy   Patient is on Treatment Plan : PROSTATE Cabazitaxel  (20) D1 + Prednisone  D1-21 q21d     11/18/2023 -  Chemotherapy   Patient is on Treatment Plan : PROSTATE Carboplatin  (AUC 4) + Docetaxel  (60) q21d        PHYSICAL EXAMINATION: ECOG PERFORMANCE STATUS: 2 - Symptomatic, <50% confined to bed  Vitals:   01/20/24 1058  BP: 130/68  Pulse: 78  Resp: 18  Temp: (!) 97 F (36.1 C)   SpO2: 98%   Filed Weights   01/20/24 1058  Weight: 224 lb 12.8 oz (102 kg)    GENERAL: alert, no distress and comfortable SKIN: skin color normal and no petechiae on exposed skin LUNGS: clear to auscultation and percussion with normal breathing effort HEART: regular rate & rhythm  ABDOMEN: abdomen soft, non-tender and nondistended. Musculoskeletal: Bilateral lower extremity edema   Relevant data reviewed during this visit included labs.

## 2024-01-20 ENCOUNTER — Ambulatory Visit: Admitting: Oncology

## 2024-01-20 ENCOUNTER — Inpatient Hospital Stay

## 2024-01-20 ENCOUNTER — Other Ambulatory Visit: Payer: Self-pay

## 2024-01-20 ENCOUNTER — Inpatient Hospital Stay (HOSPITAL_BASED_OUTPATIENT_CLINIC_OR_DEPARTMENT_OTHER)

## 2024-01-20 ENCOUNTER — Ambulatory Visit

## 2024-01-20 ENCOUNTER — Other Ambulatory Visit

## 2024-01-20 VITALS — BP 130/68 | HR 78 | Temp 97.0°F | Resp 18 | Wt 224.8 lb

## 2024-01-20 DIAGNOSIS — C61 Malignant neoplasm of prostate: Secondary | ICD-10-CM

## 2024-01-20 DIAGNOSIS — Z9189 Other specified personal risk factors, not elsewhere classified: Secondary | ICD-10-CM

## 2024-01-20 DIAGNOSIS — G629 Polyneuropathy, unspecified: Secondary | ICD-10-CM

## 2024-01-20 DIAGNOSIS — C771 Secondary and unspecified malignant neoplasm of intrathoracic lymph nodes: Secondary | ICD-10-CM | POA: Diagnosis not present

## 2024-01-20 DIAGNOSIS — D500 Iron deficiency anemia secondary to blood loss (chronic): Secondary | ICD-10-CM

## 2024-01-20 DIAGNOSIS — D696 Thrombocytopenia, unspecified: Secondary | ICD-10-CM | POA: Insufficient documentation

## 2024-01-20 DIAGNOSIS — I10 Essential (primary) hypertension: Secondary | ICD-10-CM

## 2024-01-20 DIAGNOSIS — Z5111 Encounter for antineoplastic chemotherapy: Secondary | ICD-10-CM | POA: Diagnosis not present

## 2024-01-20 LAB — COMPREHENSIVE METABOLIC PANEL WITH GFR
ALT: 9 U/L (ref 0–44)
AST: 11 U/L — ABNORMAL LOW (ref 15–41)
Albumin: 3.6 g/dL (ref 3.5–5.0)
Alkaline Phosphatase: 43 U/L (ref 38–126)
Anion gap: 6 (ref 5–15)
BUN: 14 mg/dL (ref 8–23)
CO2: 31 mmol/L (ref 22–32)
Calcium: 9.1 mg/dL (ref 8.9–10.3)
Chloride: 105 mmol/L (ref 98–111)
Creatinine, Ser: 0.98 mg/dL (ref 0.61–1.24)
GFR, Estimated: >60 mL/min (ref >60)
Glucose, Bld: 90 mg/dL (ref 70–99)
Potassium: 4.5 mmol/L (ref 3.5–5.1)
Sodium: 142 mmol/L (ref 135–145)
Total Bilirubin: 0.6 mg/dL (ref 0.0–1.2)
Total Protein: 5.9 g/dL — ABNORMAL LOW (ref 6.5–8.1)

## 2024-01-20 LAB — CBC WITH DIFFERENTIAL/PLATELET
Abs Immature Granulocytes: 0.06 K/uL (ref 0.00–0.07)
Basophils Absolute: 0 K/uL (ref 0.0–0.1)
Basophils Relative: 0 %
Eosinophils Absolute: 0.2 K/uL (ref 0.0–0.5)
Eosinophils Relative: 3 %
HCT: 30 % — ABNORMAL LOW (ref 39.0–52.0)
Hemoglobin: 9.7 g/dL — ABNORMAL LOW (ref 13.0–17.0)
Immature Granulocytes: 1 %
Lymphocytes Relative: 6 %
Lymphs Abs: 0.5 K/uL — ABNORMAL LOW (ref 0.7–4.0)
MCH: 29.9 pg (ref 26.0–34.0)
MCHC: 32.3 g/dL (ref 30.0–36.0)
MCV: 92.6 fL (ref 80.0–100.0)
Monocytes Absolute: 0.9 K/uL (ref 0.1–1.0)
Monocytes Relative: 11 %
Neutro Abs: 5.9 K/uL (ref 1.7–7.7)
Neutrophils Relative %: 79 %
Platelets: 95 K/uL — ABNORMAL LOW (ref 150–400)
RBC: 3.24 MIL/uL — ABNORMAL LOW (ref 4.22–5.81)
RDW: 24.1 % — ABNORMAL HIGH (ref 11.5–15.5)
WBC: 7.5 K/uL (ref 4.0–10.5)
nRBC: 0.3 % — ABNORMAL HIGH (ref 0.0–0.2)

## 2024-01-20 LAB — FOLATE: Folate: 9.4 ng/mL (ref >5.9)

## 2024-01-20 LAB — VITAMIN B12: Vitamin B-12: 2730 pg/mL — ABNORMAL HIGH (ref 180–914)

## 2024-01-20 LAB — MAGNESIUM: Magnesium: 1.8 mg/dL (ref 1.7–2.4)

## 2024-01-20 MED ORDER — SODIUM CHLORIDE 0.9% FLUSH
10.0000 mL | Freq: Once | INTRAVENOUS | Status: AC
  Administered 2024-01-20: 10 mL via INTRAVENOUS

## 2024-01-20 MED ORDER — SODIUM CHLORIDE 0.9 % IV SOLN: Freq: Once | INTRAVENOUS | Status: DC

## 2024-01-20 NOTE — Progress Notes (Signed)
 Per Dr. Tina, treatment cancelled for today. Labs drawn, port de accessed and pt. left via wheelchair and no respiratory distress noted.

## 2024-01-20 NOTE — Assessment & Plan Note (Addendum)
 Hold treatment today Return in 3 weeks with repeat labs

## 2024-01-21 ENCOUNTER — Telehealth: Payer: Self-pay

## 2024-01-21 ENCOUNTER — Ambulatory Visit: Payer: Self-pay

## 2024-01-21 LAB — TESTOSTERONE: Testosterone: <3 ng/dL — ABNORMAL LOW (ref 264–916)

## 2024-01-21 LAB — PROSTATE-SPECIFIC AG, SERUM (LABCORP): Prostate Specific Ag, Serum: 212 ng/mL — ABNORMAL HIGH (ref 0.0–4.0)

## 2024-01-21 NOTE — Telephone Encounter (Signed)
 Scheduled appointments per WQ. Called and left a VM with appointment details for the patient.

## 2024-01-22 ENCOUNTER — Other Ambulatory Visit: Payer: Self-pay

## 2024-02-10 ENCOUNTER — Inpatient Hospital Stay (HOSPITAL_BASED_OUTPATIENT_CLINIC_OR_DEPARTMENT_OTHER)

## 2024-02-10 ENCOUNTER — Other Ambulatory Visit

## 2024-02-10 ENCOUNTER — Encounter: Payer: Self-pay | Admitting: Oncology

## 2024-02-10 ENCOUNTER — Inpatient Hospital Stay: Attending: Hematology

## 2024-02-10 ENCOUNTER — Inpatient Hospital Stay

## 2024-02-10 VITALS — BP 152/95 | HR 70 | Temp 97.7°F | Resp 18 | Wt 223.5 lb

## 2024-02-10 VITALS — BP 129/82 | HR 81 | Temp 98.6°F | Resp 18

## 2024-02-10 DIAGNOSIS — C771 Secondary and unspecified malignant neoplasm of intrathoracic lymph nodes: Secondary | ICD-10-CM | POA: Insufficient documentation

## 2024-02-10 DIAGNOSIS — C787 Secondary malignant neoplasm of liver and intrahepatic bile duct: Secondary | ICD-10-CM | POA: Insufficient documentation

## 2024-02-10 DIAGNOSIS — C7951 Secondary malignant neoplasm of bone: Secondary | ICD-10-CM | POA: Diagnosis present

## 2024-02-10 DIAGNOSIS — C61 Malignant neoplasm of prostate: Secondary | ICD-10-CM | POA: Diagnosis present

## 2024-02-10 DIAGNOSIS — D696 Thrombocytopenia, unspecified: Secondary | ICD-10-CM | POA: Insufficient documentation

## 2024-02-10 DIAGNOSIS — E291 Testicular hypofunction: Secondary | ICD-10-CM | POA: Diagnosis not present

## 2024-02-10 DIAGNOSIS — Z5111 Encounter for antineoplastic chemotherapy: Secondary | ICD-10-CM | POA: Insufficient documentation

## 2024-02-10 DIAGNOSIS — R630 Anorexia: Secondary | ICD-10-CM

## 2024-02-10 DIAGNOSIS — G629 Polyneuropathy, unspecified: Secondary | ICD-10-CM | POA: Diagnosis not present

## 2024-02-10 DIAGNOSIS — K838 Other specified diseases of biliary tract: Secondary | ICD-10-CM | POA: Diagnosis not present

## 2024-02-10 DIAGNOSIS — Z5189 Encounter for other specified aftercare: Secondary | ICD-10-CM | POA: Insufficient documentation

## 2024-02-10 DIAGNOSIS — Z79899 Other long term (current) drug therapy: Secondary | ICD-10-CM | POA: Insufficient documentation

## 2024-02-10 DIAGNOSIS — I10 Essential (primary) hypertension: Secondary | ICD-10-CM | POA: Insufficient documentation

## 2024-02-10 DIAGNOSIS — Z9189 Other specified personal risk factors, not elsewhere classified: Secondary | ICD-10-CM

## 2024-02-10 LAB — CBC WITH DIFFERENTIAL (CANCER CENTER ONLY)
Abs Immature Granulocytes: 0.02 K/uL (ref 0.00–0.07)
Basophils Absolute: 0 K/uL (ref 0.0–0.1)
Basophils Relative: 0 %
Eosinophils Absolute: 0 K/uL (ref 0.0–0.5)
Eosinophils Relative: 0 %
HCT: 32.8 % — ABNORMAL LOW (ref 39.0–52.0)
Hemoglobin: 10.7 g/dL — ABNORMAL LOW (ref 13.0–17.0)
Immature Granulocytes: 0 %
Lymphocytes Relative: 8 %
Lymphs Abs: 0.6 K/uL — ABNORMAL LOW (ref 0.7–4.0)
MCH: 31.3 pg (ref 26.0–34.0)
MCHC: 32.6 g/dL (ref 30.0–36.0)
MCV: 95.9 fL (ref 80.0–100.0)
Monocytes Absolute: 0.9 K/uL (ref 0.1–1.0)
Monocytes Relative: 12 %
Neutro Abs: 6 K/uL (ref 1.7–7.7)
Neutrophils Relative %: 80 %
Platelet Count: 232 K/uL (ref 150–400)
RBC: 3.42 MIL/uL — ABNORMAL LOW (ref 4.22–5.81)
RDW: 19.8 % — ABNORMAL HIGH (ref 11.5–15.5)
WBC Count: 7.6 K/uL (ref 4.0–10.5)
nRBC: 0 % (ref 0.0–0.2)

## 2024-02-10 LAB — CMP (CANCER CENTER ONLY)
ALT: 10 U/L (ref 0–44)
AST: 11 U/L — ABNORMAL LOW (ref 15–41)
Albumin: 3.7 g/dL (ref 3.5–5.0)
Alkaline Phosphatase: 40 U/L (ref 38–126)
Anion gap: 5 (ref 5–15)
BUN: 18 mg/dL (ref 8–23)
CO2: 34 mmol/L — ABNORMAL HIGH (ref 22–32)
Calcium: 8.7 mg/dL — ABNORMAL LOW (ref 8.9–10.3)
Chloride: 104 mmol/L (ref 98–111)
Creatinine: 1.09 mg/dL (ref 0.61–1.24)
GFR, Estimated: >60 mL/min (ref >60)
Glucose, Bld: 124 mg/dL — ABNORMAL HIGH (ref 70–99)
Potassium: 4.2 mmol/L (ref 3.5–5.1)
Sodium: 143 mmol/L (ref 135–145)
Total Bilirubin: 0.7 mg/dL (ref 0.0–1.2)
Total Protein: 6.2 g/dL — ABNORMAL LOW (ref 6.5–8.1)

## 2024-02-10 LAB — LACTATE DEHYDROGENASE: LDH: 232 U/L — ABNORMAL HIGH (ref 98–192)

## 2024-02-10 LAB — CEA (ACCESS): CEA (CHCC): 4.92 ng/mL (ref 0.00–5.00)

## 2024-02-10 MED ORDER — PALONOSETRON HCL INJECTION 0.25 MG/5ML
0.2500 mg | Freq: Once | INTRAVENOUS | Status: AC
  Administered 2024-02-10: 0.25 mg via INTRAVENOUS
  Filled 2024-02-10: qty 5

## 2024-02-10 MED ORDER — LIDOCAINE-PRILOCAINE 2.5-2.5 % EX CREA: 1.0000 | TOPICAL_CREAM | CUTANEOUS | 0 refills | Status: AC | PRN

## 2024-02-10 MED ORDER — DENOSUMAB 120 MG/1.7ML ~~LOC~~ SOLN
120.0000 mg | Freq: Once | SUBCUTANEOUS | Status: AC
  Administered 2024-02-10: 120 mg via SUBCUTANEOUS
  Filled 2024-02-10: qty 1.7

## 2024-02-10 MED ORDER — SODIUM CHLORIDE 0.9 % IV SOLN
388.8000 mg | Freq: Once | INTRAVENOUS | Status: AC
  Administered 2024-02-10: 390 mg via INTRAVENOUS
  Filled 2024-02-10: qty 39

## 2024-02-10 MED ORDER — APREPITANT 130 MG/18ML IV EMUL
130.0000 mg | Freq: Once | INTRAVENOUS | Status: AC
  Administered 2024-02-10: 130 mg via INTRAVENOUS
  Filled 2024-02-10: qty 18

## 2024-02-10 MED ORDER — PEGFILGRASTIM-CBQV (INF DEV) 6 MG/0.6ML ~~LOC~~ SOSY
6.0000 mg | PREFILLED_SYRINGE | Freq: Once | SUBCUTANEOUS | Status: AC
  Administered 2024-02-10: 6 mg via SUBCUTANEOUS
  Filled 2024-02-10: qty 0.6

## 2024-02-10 MED ORDER — SODIUM CHLORIDE 0.9 % IV SOLN
50.0000 mg/m2 | Freq: Once | INTRAVENOUS | Status: AC
  Administered 2024-02-10: 111 mg via INTRAVENOUS
  Filled 2024-02-10: qty 11.04

## 2024-02-10 MED ORDER — DEXAMETHASONE SODIUM PHOSPHATE 10 MG/ML IJ SOLN
10.0000 mg | Freq: Once | INTRAMUSCULAR | Status: AC
  Administered 2024-02-10: 10 mg via INTRAVENOUS
  Filled 2024-02-10: qty 1

## 2024-02-10 MED ORDER — SODIUM CHLORIDE 0.9 % IV SOLN: INTRAVENOUS | Status: DC

## 2024-02-10 MED ORDER — MEGESTROL ACETATE 40 MG/ML PO SUSP
400.0000 mg | Freq: Two times a day (BID) | ORAL | 0 refills | Status: AC
Start: 2024-02-10 — End: ?

## 2024-02-10 NOTE — Assessment & Plan Note (Addendum)
 Given recent near syncope, recommend hold losartan  if SBP <120 and Bumex  if SBP <110

## 2024-02-10 NOTE — Progress Notes (Signed)
 [facility] OFFICE PROGRESS NOTE  Patient Care Team: Clinic, Bonni Lien as PCP - General Patrcia Cough, MD as Consulting Physician (Radiation Oncology) Vertell Pont, RN as Oncology Nurse Navigator  83 y.o.m with history of mCRPC s/p multiple lines of therapy, hypertension being seen on active treatment for prostate cancer.   Current diagnosis: mCRPC Previous treatment as below: Germline testing negative. VUS in PMS2.  Somatic testing: in 2021. MS-stable.  AR amplification. Somatic testing 2025: CHEK2.   1.  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 Docetaxel  for 14 cycles discontinued as his PSA plateaued around 11 07/13/2016 - 04/09/2019 Abiraterone  and prednisone  discontinued secondary to PSA progression. 04/12/2019 - 11/03/2019 Enzalutamide  from with progression. 11/10/2019 CT CAP shows no findings of active malignancy.  Stable small sclerotic lesions at T4 and T10, nonspecific.  Right hydronephrosis with right proximal hydroureter extending down to a 0.8 x 0.8 x 0.4 cm proximal ureteral stone at L3 vertical level.  Nonobstructive right and possibly left nephrolithiasis.   11/10/2019 Bone scan shows right proximal tibial metaphyseal activity from recent trauma.  Degenerative findings in the spine, right sternoclavicular joint, shoulders and right foot.  No evidence of metastatic disease.   Foundation 1 test shows MS-stable.  AR amplification.  Sensitivity is reduced due to sample quality. 12/12/2019 Guardant 360 MSI high not detected.  TMB was not evaluable.  No other targetable mutations.   05/15/20 PET scan showed Nodal metastases. Intense radiotracer activity in the left supraclavicular and prevascular lymph nodes, measuring 10 mm.  Cluster of small prevascular lymph nodes measures 5-7 mm each with SUV 54.  AP window lymph node measuring 8 mm.  No activity in the prostate gland or abdominal or pelvic  lymph nodes.  No skeletal activity.   08/27/2020 - 09/06/2020 SBRT to the left supraclavicular lymph node and prevascular mediastinal lymph node from, 40 Gray in 5 fractions.   10/22/2021 CT CAP: Done at [facility]: No lymphadenopathy.  Cirrhosis.  Intrahepatic and extrahepatic biliary ductal dilatation. 10/28/2021 Bone scan: New focal abnormal tracer uptake at the inferior aspect of the right scapula.   11/13/2021 PSMA PET scan: Interval development of multifocal tracer avid bone metastasis, only 1 of these lesions is visible on the recent nuclear medicine bone scan dated 10/28/2021.  New tracer avid right axillary, subcarinal, left paratracheal, left retrocrural and upper abdominal lymph nodes.   12/15/2021 - 09/08/2022 13 cycles of cabazitaxel  with progression   09/17/22 PSMA PET. Progression of prostate cancer nodal metastasis. Mild progression of skeletal metastasis.  New radiotracer avid RIGHT level III cervical lymph nodes, RIGHT supraclavicular lymph nodes, RIGHT axillary lymph node, and RIGHT retrocrural lymph nodes. Two new radiotracer avid skeletal metastasis in the RIGHT pelvis. Stable additional multifocal skeletal metastasis.   11/16/2022 - 06/15/2023 Pluvicto  x 6. PSA nadir at 10.82 in Oct 2024.   10/29/2023 Cycle 1 of Xofigo  discontinued secondary to new hepatic metastasis on PSMA PET scan on 11/04/2023. New nodal metastasis above and below the diaphragm and progression of skeletal metastasis. 11/18/23 PSA 574. - Cycle 1 of docetaxel  and carboplatin  on 11/18/2023 - Cycle 2 of docetaxel  and carboplatin  on 12/09/2023 - Cycle 3 of docetaxel  and carboplatin  on 12/30/2023 - Cycle 4 postpone due to thrombocytopenia.  02/10/2024.    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 Monitor PSA, CEA, LDH Return in 3 weeks with repeat labs At risk for  side effect of medication Calcium 1200 mg and vit D 1000  international units daily Xgeva  with visit next visit Neuropathy Stable. Essential hypertension, benign Given recent near syncope, recommend hold losartan  if SBP <120 and Bumex  if SBP <110 Decreased appetite Refill megace  today.    Pauletta JAYSON Chihuahua, MD  INTERVAL HISTORY: Patient returns for follow-up.  Overall feeling well.  No fever, chills.  No bone pain or chest pain.  No new symptoms.  Still walking.  Appetite is good.  Taste was previously affected but improved from Megace .   PHYSICAL EXAMINATION: ECOG PERFORMANCE STATUS: 2 - Symptomatic, <50% confined to bed  Vitals:   02/10/24 0930  BP: (!) 152/95  Pulse: 70  Resp: 18  Temp: 97.7 F (36.5 C)  SpO2: 95%   Filed Weights   02/10/24 0930  Weight: 223 lb 8 oz (101.4 kg)    GENERAL: alert, no distress and comfortable EYES: sclera clear LUNGS: clear to auscultation and percussion with normal breathing effort HEART: regular rate & rhythm  ABDOMEN: abdomen soft, non-tender and nondistended.  Relevant data reviewed during this visit included labs.

## 2024-02-10 NOTE — Patient Instructions (Addendum)
 [facility] - A DEPT OF [facility]  Discharge Instructions: Thank you for choosing [facility] to provide your oncology and hematology care.   If you have a lab appointment with the Cancer Center, please go directly to the Cancer Center and check in at the registration area.   Wear comfortable clothing and clothing appropriate for easy access to any Portacath or PICC line.   We strive to give you quality time with your provider. You may need to reschedule your appointment if you arrive late (15 or more minutes).  Arriving late affects you and other patients whose appointments are after yours.  Also, if you miss three or more appointments without notifying the office, you may be dismissed from the clinic at the provider's discretion.      For prescription refill requests, have your pharmacy contact our office and allow 72 hours for refills to be completed.    Today you received the following chemotherapy and/or immunotherapy agents : Docetaxel  and Carboplatin . Udenyca  on body   To help prevent nausea and vomiting after your treatment, we encourage you to take your nausea medication as directed.  BELOW ARE SYMPTOMS THAT SHOULD BE REPORTED IMMEDIATELY: *FEVER GREATER THAN 100.4 F (38 C) OR HIGHER *CHILLS OR SWEATING *NAUSEA AND VOMITING THAT IS NOT CONTROLLED WITH YOUR NAUSEA MEDICATION *UNUSUAL SHORTNESS OF BREATH *UNUSUAL BRUISING OR BLEEDING *URINARY PROBLEMS (pain or burning when urinating, or frequent urination) *BOWEL PROBLEMS (unusual diarrhea, constipation, pain near the anus) TENDERNESS IN MOUTH AND THROAT WITH OR WITHOUT PRESENCE OF ULCERS (sore throat, sores in mouth, or a toothache) UNUSUAL RASH, SWELLING OR PAIN  UNUSUAL VAGINAL DISCHARGE OR ITCHING   Items with * indicate a potential emergency and should be followed up as soon as possible or go to the Emergency Department if any problems should occur.  Please show the  CHEMOTHERAPY ALERT CARD or IMMUNOTHERAPY ALERT CARD at check-in to the Emergency Department and triage nurse.  Should you have questions after your visit or need to cancel or reschedule your appointment, please contact [facility] - A DEPT OF [facility]  Dept: [phone]  and follow the prompts.  Office hours are 8:00 a.m. to 4:30 p.m. Monday - Friday. Please note that voicemails left after 4:00 p.m. may not be returned until the following business day.  We are closed weekends and major holidays. You have access to a nurse at all times for urgent questions. Please call the main number to the clinic Dept: [phone] and follow the prompts.   For any non-urgent questions, you may also contact your provider using MyChart. We now offer e-Visits for anyone 67 and older to request care online for non-urgent symptoms. For details visit [email or web address].   Also download the MyChart app! Go to the app store, search MyChart, open the app, select [facility], and log in with your MyChart username and password.  Pegfilgrastim  Injection What is this medication? PEGFILGRASTIM  (PEG fil gra stim) lowers the risk of infection in people who are receiving chemotherapy. It works by Systems analyst make more white blood cells, which protects your body from infection. It may also be used to help people who have been exposed to high doses of radiation. This medicine may be used for other purposes; ask your health care provider or pharmacist if you have questions. COMMON BRAND NAME(S): Fulphila , Fylnetra , Neulasta , Nyvepria , Stimufend , UDENYCA , UDENYCA  ONBODY, Ziextenzo  What should  I tell my care team before I take this medication? They need to know if you have any of these conditions: Kidney disease Latex allergy Ongoing radiation therapy Sickle cell disease Skin reactions to acrylic adhesives (On-Body Injector only) An unusual or allergic reaction to pegfilgrastim ,  filgrastim, other medications, foods, dyes, or preservatives Pregnant or trying to get pregnant Breast-feeding How should I use this medication? This medication is for injection under the skin. If you get this medication at home, you will be taught how to prepare and give the pre-filled syringe or how to use the On-body Injector. Refer to the patient Instructions for Use for detailed instructions. Use exactly as directed. Tell your care team immediately if you suspect that the On-body Injector may not have performed as intended or if you suspect the use of the On-body Injector resulted in a missed or partial dose. It is important that you put your used needles and syringes in a special sharps container. Do not put them in a trash can. If you do not have a sharps container, call your pharmacist or care team to get one. Talk to your care team about the use of this medication in children. While this medication may be prescribed for selected conditions, precautions do apply. Overdosage: If you think you have taken too much of this medicine contact a poison control center or emergency room at once. NOTE: This medicine is only for you. Do not share this medicine with others. What if I miss a dose? It is important not to miss your dose. Call your care team if you miss your dose. If you miss a dose due to an On-body Injector failure or leakage, a new dose should be administered as soon as possible using a single prefilled syringe for manual use. What may interact with this medication? Interactions have not been studied. This list may not describe all possible interactions. Give your health care provider a list of all the medicines, herbs, non-prescription drugs, or dietary supplements you use. Also tell them if you smoke, drink alcohol, or use illegal drugs. Some items may interact with your medicine. What should I watch for while using this medication? Your condition will be monitored carefully while you are  receiving this medication. You may need blood work done while you are taking this medication. Talk to your care team about your risk of cancer. You may be more at risk for certain types of cancer if you take this medication. If you are going to need a MRI, CT scan, or other procedure, tell your care team that you are using this medication (On-Body Injector only). What side effects may I notice from receiving this medication? Side effects that you should report to your care team as soon as possible: Allergic reactions--skin rash, itching, hives, swelling of the face, lips, tongue, or throat Capillary leak syndrome--stomach or muscle pain, unusual weakness or fatigue, feeling faint or lightheaded, decrease in the amount of urine, swelling of the ankles, hands, or feet, trouble breathing High white blood cell level--fever, fatigue, trouble breathing, night sweats, change in vision, weight loss Inflammation of the aorta--fever, fatigue, back, chest, or stomach pain, severe headache Kidney injury (glomerulonephritis)--decrease in the amount of urine, red or dark brown urine, foamy or bubbly urine, swelling of the ankles, hands, or feet Shortness of breath or trouble breathing Spleen injury--pain in upper left stomach or shoulder Unusual bruising or bleeding Side effects that usually do not require medical attention (report to your care team if they  continue or are bothersome): Bone pain Pain in the hands or feet This list may not describe all possible side effects. Call your doctor for medical advice about side effects. You may report side effects to FDA at 1-800-FDA-1088. Where should I keep my medication? Keep out of the reach of children. If you are using this medication at home, you will be instructed on how to store it. Throw away any unused medication after the expiration date on the label. NOTE: This sheet is a summary. It may not cover all possible information. If you have questions about  this medicine, talk to your doctor, pharmacist, or health care provider.  2024 Elsevier/Gold Standard (2021-04-11 00:00:00) Denosumab  Injection (Oncology) What is this medication? DENOSUMAB  (den oh SUE mab) prevents weakened bones caused by cancer. It may also be used to treat noncancerous bone tumors that cannot be removed by surgery. It can also be used to treat high calcium levels in the blood caused by cancer. It works by blocking a protein that causes bones to break down quickly. This slows down the release of calcium from bones, which lowers calcium levels in your blood. It also makes your bones stronger and less likely to break (fracture). This medicine may be used for other purposes; ask your health care provider or pharmacist if you have questions. COMMON BRAND NAME(S): XGEVA  What should I tell my care team before I take this medication? They need to know if you have any of these conditions: Dental disease Having surgery or tooth extraction Infection Kidney disease Low levels of calcium or vitamin D in the blood Malnutrition On hemodialysis Skin conditions or sensitivity Thyroid or parathyroid disease An unusual reaction to denosumab , other medications, foods, dyes, or preservatives Pregnant or trying to get pregnant Breast-feeding How should I use this medication? This medication is for injection under the skin. It is given by your care team in a hospital or clinic setting. A special MedGuide will be given to you before each treatment. Be sure to read this information carefully each time. Talk to your care team about the use of this medication in children. While it may be prescribed for children as young as 13 years for selected conditions, precautions do apply. Overdosage: If you think you have taken too much of this medicine contact a poison control center or emergency room at once. NOTE: This medicine is only for you. Do not share this medicine with others. What if I miss a  dose? Keep appointments for follow-up doses. It is important not to miss your dose. Call your care team if you are unable to keep an appointment. What may interact with this medication? Do not take this medication with any of the following: Other medications containing denosumab  This medication may also interact with the following: Medications that lower your chance of fighting infection Steroid medications, such as prednisone  or cortisone This list may not describe all possible interactions. Give your health care provider a list of all the medicines, herbs, non-prescription drugs, or dietary supplements you use. Also tell them if you smoke, drink alcohol, or use illegal drugs. Some items may interact with your medicine. What should I watch for while using this medication? Your condition will be monitored carefully while you are receiving this medication. You may need blood work while taking this medication. This medication may increase your risk of getting an infection. Call your care team for advice if you get a fever, chills, sore throat, or other symptoms of a cold or flu.  Do not treat yourself. Try to avoid being around people who are sick. You should make sure you get enough calcium and vitamin D while you are taking this medication, unless your care team tells you not to. Discuss the foods you eat and the vitamins you take with your care team. Some people who take this medication have severe bone, joint, or muscle pain. This medication may also increase your risk for jaw problems or a broken thigh bone. Tell your care team right away if you have severe pain in your jaw, bones, joints, or muscles. Tell your care team if you have any pain that does not go away or that gets worse. Talk to your care team if you may be pregnant. Serious birth defects can occur if you take this medication during pregnancy and for 5 months after the last dose. You will need a negative pregnancy test before starting  this medication. Contraception is recommended while taking this medication and for 5 months after the last dose. Your care team can help you find the option that works for you. What side effects may I notice from receiving this medication? Side effects that you should report to your care team as soon as possible: Allergic reactions--skin rash, itching, hives, swelling of the face, lips, tongue, or throat Bone, joint, or muscle pain Low calcium level--muscle pain or cramps, confusion, tingling, or numbness in the hands or feet Osteonecrosis of the jaw--pain, swelling, or redness in the mouth, numbness of the jaw, poor healing after dental work, unusual discharge from the mouth, visible bones in the mouth Side effects that usually do not require medical attention (report to your care team if they continue or are bothersome): Cough Diarrhea Fatigue Headache Nausea This list may not describe all possible side effects. Call your doctor for medical advice about side effects. You may report side effects to FDA at 1-800-FDA-1088. Where should I keep my medication? This medication is given in a hospital or clinic. It will not be stored at home. NOTE: This sheet is a summary. It may not cover all possible information. If you have questions about this medicine, talk to your doctor, pharmacist, or health care provider.  2024 Elsevier/Gold Standard (2021-10-01 00:00:00)

## 2024-02-10 NOTE — Assessment & Plan Note (Addendum)
 Refill megace  today.

## 2024-02-10 NOTE — Assessment & Plan Note (Addendum)
 Calcium 1200 mg and vit D 1000 international units daily Xgeva  with visit next visit

## 2024-02-10 NOTE — Assessment & Plan Note (Addendum)
 We will resume docetaxel  with carboplatin  today. Monitor PSA, CEA, LDH Return in 3 weeks with repeat labs

## 2024-02-10 NOTE — Assessment & Plan Note (Addendum)
 Stable

## 2024-02-11 LAB — PROSTATE-SPECIFIC AG, SERUM (LABCORP): Prostate Specific Ag, Serum: 196 ng/mL — ABNORMAL HIGH (ref 0.0–4.0)

## 2024-02-11 LAB — TESTOSTERONE: Testosterone: <3 ng/dL — ABNORMAL LOW (ref 264–916)

## 2024-02-14 ENCOUNTER — Ambulatory Visit: Payer: Self-pay

## 2024-02-14 NOTE — Telephone Encounter (Signed)
-----   Message from Pauletta JAYSON Chihuahua sent at 02/14/2024  4:14 PM EDT ----- Zorita please let daughter know good news. PSA is coming down despite no treatment 3 weeks ago. Thanks.  ----- Message ----- From: Rebecka, [facility] [place] Sent: 02/10/2024   9:08 AM EDT To: Pauletta JAYSON Chihuahua, MD

## 2024-02-14 NOTE — Telephone Encounter (Signed)
 Notified of message below

## 2024-02-17 ENCOUNTER — Other Ambulatory Visit: Payer: Self-pay

## 2024-02-17 DIAGNOSIS — C61 Malignant neoplasm of prostate: Secondary | ICD-10-CM

## 2024-02-28 ENCOUNTER — Ambulatory Visit

## 2024-02-29 NOTE — Assessment & Plan Note (Addendum)
 We will resume docetaxel  with carboplatin  today. Monitor PSA, CEA, LDH Return in 3 weeks with repeat labs Olaprib in the future.

## 2024-02-29 NOTE — Progress Notes (Unsigned)
 [facility] OFFICE PROGRESS NOTE  Patient Care Team: Clinic, Bonni Lien as PCP - General Patrcia Cough, MD as Consulting Physician (Radiation Oncology) Vertell Pont, RN as Oncology Nurse Navigator  83 y.o.m with history of mCRPC s/p multiple lines of therapy, hypertension being seen on active treatment for prostate cancer.   Current diagnosis: mCRPC Previous treatment as below: Germline testing negative. VUS in PMS2.  Somatic testing: in 2021. MS-stable.  AR amplification. Somatic testing 2025: CHEK2.   1.  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 Docetaxel  for 14 cycles discontinued as his PSA plateaued around 11 07/13/2016 - 04/09/2019 Abiraterone  and prednisone  discontinued secondary to PSA progression. 04/12/2019 - 11/03/2019 Enzalutamide  from with progression. 11/10/2019 CT CAP shows no findings of active malignancy.  Stable small sclerotic lesions at T4 and T10, nonspecific.  Right hydronephrosis with right proximal hydroureter extending down to a 0.8 x 0.8 x 0.4 cm proximal ureteral stone at L3 vertical level.  Nonobstructive right and possibly left nephrolithiasis.   11/10/2019 Bone scan shows right proximal tibial metaphyseal activity from recent trauma.  Degenerative findings in the spine, right sternoclavicular joint, shoulders and right foot.  No evidence of metastatic disease.   Foundation 1 test shows MS-stable.  AR amplification.  Sensitivity is reduced due to sample quality. 12/12/2019 Guardant 360 MSI high not detected.  TMB was not evaluable.  No other targetable mutations.   05/15/20 PET scan showed Nodal metastases. Intense radiotracer activity in the left supraclavicular and prevascular lymph nodes, measuring 10 mm.  Cluster of small prevascular lymph nodes measures 5-7 mm each with SUV 54.  AP window lymph node measuring 8 mm.  No activity in the prostate gland or abdominal or pelvic  lymph nodes.  No skeletal activity.   08/27/2020 - 09/06/2020 SBRT to the left supraclavicular lymph node and prevascular mediastinal lymph node from, 40 Gray in 5 fractions.   10/22/2021 CT CAP: Done at [facility]: No lymphadenopathy.  Cirrhosis.  Intrahepatic and extrahepatic biliary ductal dilatation. 10/28/2021 Bone scan: New focal abnormal tracer uptake at the inferior aspect of the right scapula.   11/13/2021 PSMA PET scan: Interval development of multifocal tracer avid bone metastasis, only 1 of these lesions is visible on the recent nuclear medicine bone scan dated 10/28/2021.  New tracer avid right axillary, subcarinal, left paratracheal, left retrocrural and upper abdominal lymph nodes.   12/15/2021 - 09/08/2022 13 cycles of cabazitaxel  with progression   09/17/22 PSMA PET. Progression of prostate cancer nodal metastasis. Mild progression of skeletal metastasis.  New radiotracer avid RIGHT level III cervical lymph nodes, RIGHT supraclavicular lymph nodes, RIGHT axillary lymph node, and RIGHT retrocrural lymph nodes. Two new radiotracer avid skeletal metastasis in the RIGHT pelvis. Stable additional multifocal skeletal metastasis.   11/16/2022 - 06/15/2023 Pluvicto  x 6. PSA nadir at 10.82 in Oct 2024.   10/29/2023 Cycle 1 of Xofigo  discontinued secondary to new hepatic metastasis on PSMA PET scan on 11/04/2023. New nodal metastasis above and below the diaphragm and progression of skeletal metastasis. 11/18/23 PSA 574. - Cycle 1 of docetaxel  and carboplatin  on 11/18/2023 - Cycle 2 of docetaxel  and carboplatin  on 12/09/2023 - Cycle 3 of docetaxel  and carboplatin  on 12/30/2023 - Cycle 4 postpone due to thrombocytopenia.  02/10/2024.  Thrombocytopenia.  Wanting to postpone treatment.  Discussed goals of care, consultation for advance care planning today.  Discussed palliative treatment for his prostate cancer.  Most people cannot tolerate more than 6 cycles  of his current combination chemotherapy.  He  will experience a progression once treatment stop in the future.  Depending on how his recovery, cytopenia etc., he may or may not be able to receive more treatment.  Will continue to monitor.  In the meantime, palliative care consult and aggressive care planning is very reasonable.  If for reason he has rapid progression, or unable to tolerate more treatment, transition to hospice should be discussed. 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 Monitor PSA, CEA, LDH Return in 3 weeks with repeat labs Planned cycle 16 December instead of Nov Olaprib in the future. Thrombocytopenia Worsening thrombocytopenia secondary to chemotherapy. Will postpone treatment. Goals of care, counseling/discussion Discussed goals of care today.  Discussed that his cytopenia, will need recovery before more chemotherapy can be administered.  Discussed that given he has received multiple previous lines of treatment for his metastatic prostate cancer, treatment option is limited at this time point.  His PSA has not normalized.  He will experience progression once treatment discontinue, or unable to receive further treatment.  Once cancer progressed, he likely will suffer with more complications.  Therefore, advance care planning, palliative care consult is very reasonable.  He expressed understanding and agrees with the plan. Plan on palliative care consult at next visit. Referral placed  Orders Placed This Encounter  Procedures   PSA   CBC with Differential (Cancer Center Only)    Standing Status:   Future    Expected Date:   03/23/2024    Expiration Date:   03/23/2025   CMP (Cancer Center only)    Standing Status:   Future    Expected Date:   03/23/2024    Expiration Date:   03/23/2025   Lactate dehydrogenase    Standing Status:   Future    Expected Date:   03/23/2024    Expiration Date:   03/23/2025   Prostate-Specific  AG, Serum    Standing Status:   Future    Expected Date:   03/23/2024    Expiration Date:   03/23/2025   Testosterone     Standing Status:   Future    Expected Date:   03/23/2024    Expiration Date:   03/23/2025   CEA (Access)    Standing Status:   Future    Expected Date:   03/23/2024    Expiration Date:   03/23/2025   Amb Referral to Palliative Care    Referral Priority:   Routine    Referral Type:   Consultation    Referral Reason:   Advance Care Planning    Number of Visits Requested:   1     Pauletta JAYSON Chihuahua, MD  INTERVAL HISTORY: Patient returns for follow-up.  Overall tolerated last treatment well.  No bleeding, or other complications.  Bilateral leg swelling trying to raise the leg.  He is taking Bumex .  No fever or chills.  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  VUS identified  in PMS2 called c.516A>T.  The Common Hereditary Gene Panel offered by [facility] includes sequencing and/or deletion duplication testing of the following 48 genes: APC, ATM, AXIN2, BARD1, BMPR1A, BRCA1, BRCA2, BRIP1, CDH1, CDK4, CDKN2A (p14ARF), CDKN2A (p16INK4a), CHEK2, CTNNA1, DICER1, EPCAM (Deletion/duplication testing only), GREM1 (promoter region deletion/duplication testing only), KIT, MEN1, MLH1, MSH2, MSH3, MSH6, MUTYH, NBN, NF1, NHTL1, PALB2, PDGFRA, PMS2, POLD1, POLE, PTEN, RAD50, RAD51C, RAD51D, RNF43, SDHB, SDHC, SDHD, SMAD4, SMARCA4. STK11, TP53, TSC1, TSC2, and VHL.  The following genes were evaluated for  sequence changes only: SDHA and HOXB13 c.251G>A variant only. The report date is Oct 14, 2018.   09/18/2019 Genetic Testing   Foundation One CDx      12/12/2019 Genetic Testing   Guardant 360       12/15/2021 - 01/06/2022 Chemotherapy   Patient is on Treatment Plan : PROSTATE Cabazitaxel  + Prednisone  q21d     12/15/2021 - 09/08/2022 Chemotherapy   Patient is on Treatment Plan : PROSTATE Cabazitaxel  (20) D1 + Prednisone  D1-21 q21d     11/18/2023 -  Chemotherapy   Patient is on Treatment Plan : PROSTATE Carboplatin  (AUC 4) + Docetaxel  (60) q21d     02/29/2024 Cancer Staging   Staging form: Prostate, AJCC 8th Edition - Clinical: Stage IVB (cTX, cN1, pM1c) - Signed by Tina Pauletta BROCKS, MD on 02/29/2024      PHYSICAL EXAMINATION: ECOG PERFORMANCE STATUS: 2 - Symptomatic, <50% confined to bed  Vitals:   03/02/24 1042  BP: 139/72  Pulse: 75  Resp: 18  Temp: 97.8 F (36.6 C)  SpO2: 95%   Filed Weights   03/02/24 1042  Weight: 231 lb 14.4 oz (105.2 kg)    GENERAL: alert, no distress and comfortable on wheelchair LUNGS: clear to auscultation and no wheeze or rales with normal breathing effort HEART: regular rate & rhythm  ABDOMEN: abdomen soft, non-tender and nondistended. Musculoskeletal: Bilateral lower extremity edema  Relevant data reviewed during this visit included labs.

## 2024-03-02 ENCOUNTER — Inpatient Hospital Stay

## 2024-03-02 ENCOUNTER — Inpatient Hospital Stay: Attending: Hematology

## 2024-03-02 ENCOUNTER — Encounter (HOSPITAL_COMMUNITY): Payer: Self-pay | Admitting: Oncology

## 2024-03-02 ENCOUNTER — Other Ambulatory Visit: Payer: Self-pay

## 2024-03-02 ENCOUNTER — Encounter: Payer: Self-pay | Admitting: Oncology

## 2024-03-02 ENCOUNTER — Encounter: Admitting: Dietician

## 2024-03-02 VITALS — BP 139/72 | HR 75 | Temp 97.8°F | Resp 18 | Ht 69.0 in | Wt 231.9 lb

## 2024-03-02 DIAGNOSIS — T451X5A Adverse effect of antineoplastic and immunosuppressive drugs, initial encounter: Secondary | ICD-10-CM | POA: Diagnosis not present

## 2024-03-02 DIAGNOSIS — Z79899 Other long term (current) drug therapy: Secondary | ICD-10-CM | POA: Diagnosis not present

## 2024-03-02 DIAGNOSIS — C787 Secondary malignant neoplasm of liver and intrahepatic bile duct: Secondary | ICD-10-CM | POA: Insufficient documentation

## 2024-03-02 DIAGNOSIS — D696 Thrombocytopenia, unspecified: Secondary | ICD-10-CM | POA: Diagnosis not present

## 2024-03-02 DIAGNOSIS — C7951 Secondary malignant neoplasm of bone: Secondary | ICD-10-CM | POA: Diagnosis present

## 2024-03-02 DIAGNOSIS — C771 Secondary and unspecified malignant neoplasm of intrathoracic lymph nodes: Secondary | ICD-10-CM | POA: Insufficient documentation

## 2024-03-02 DIAGNOSIS — C61 Malignant neoplasm of prostate: Secondary | ICD-10-CM

## 2024-03-02 DIAGNOSIS — D6959 Other secondary thrombocytopenia: Secondary | ICD-10-CM | POA: Diagnosis not present

## 2024-03-02 DIAGNOSIS — R970 Elevated carcinoembryonic antigen [CEA]: Secondary | ICD-10-CM | POA: Diagnosis not present

## 2024-03-02 DIAGNOSIS — E291 Testicular hypofunction: Secondary | ICD-10-CM | POA: Diagnosis not present

## 2024-03-02 DIAGNOSIS — Z7189 Other specified counseling: Secondary | ICD-10-CM

## 2024-03-02 DIAGNOSIS — Z5111 Encounter for antineoplastic chemotherapy: Secondary | ICD-10-CM | POA: Diagnosis present

## 2024-03-02 DIAGNOSIS — Z5189 Encounter for other specified aftercare: Secondary | ICD-10-CM | POA: Diagnosis not present

## 2024-03-02 DIAGNOSIS — R972 Elevated prostate specific antigen [PSA]: Secondary | ICD-10-CM | POA: Insufficient documentation

## 2024-03-02 LAB — CMP (CANCER CENTER ONLY)
ALT: 8 U/L (ref 0–44)
AST: 11 U/L — ABNORMAL LOW (ref 15–41)
Albumin: 3.6 g/dL (ref 3.5–5.0)
Alkaline Phosphatase: 46 U/L (ref 38–126)
Anion gap: 4 — ABNORMAL LOW (ref 5–15)
BUN: 14 mg/dL (ref 8–23)
CO2: 35 mmol/L — ABNORMAL HIGH (ref 22–32)
Calcium: 9 mg/dL (ref 8.9–10.3)
Chloride: 105 mmol/L (ref 98–111)
Creatinine: 0.97 mg/dL (ref 0.61–1.24)
GFR, Estimated: >60 mL/min (ref >60)
Glucose, Bld: 106 mg/dL — ABNORMAL HIGH (ref 70–99)
Potassium: 4 mmol/L (ref 3.5–5.1)
Sodium: 144 mmol/L (ref 135–145)
Total Bilirubin: 0.7 mg/dL (ref 0.0–1.2)
Total Protein: 6.2 g/dL — ABNORMAL LOW (ref 6.5–8.1)

## 2024-03-02 LAB — CBC WITH DIFFERENTIAL (CANCER CENTER ONLY)
Abs Immature Granulocytes: 0.06 K/uL (ref 0.00–0.07)
Basophils Absolute: 0 K/uL (ref 0.0–0.1)
Basophils Relative: 1 %
Eosinophils Absolute: 0 K/uL (ref 0.0–0.5)
Eosinophils Relative: 0 %
HCT: 31.1 % — ABNORMAL LOW (ref 39.0–52.0)
Hemoglobin: 10 g/dL — ABNORMAL LOW (ref 13.0–17.0)
Immature Granulocytes: 1 %
Lymphocytes Relative: 6 %
Lymphs Abs: 0.5 K/uL — ABNORMAL LOW (ref 0.7–4.0)
MCH: 31.6 pg (ref 26.0–34.0)
MCHC: 32.2 g/dL (ref 30.0–36.0)
MCV: 98.4 fL (ref 80.0–100.0)
Monocytes Absolute: 0.9 K/uL (ref 0.1–1.0)
Monocytes Relative: 11 %
Neutro Abs: 6.9 K/uL (ref 1.7–7.7)
Neutrophils Relative %: 81 %
Platelet Count: 89 K/uL — ABNORMAL LOW (ref 150–400)
RBC: 3.16 MIL/uL — ABNORMAL LOW (ref 4.22–5.81)
RDW: 17.2 % — ABNORMAL HIGH (ref 11.5–15.5)
WBC Count: 8.5 K/uL (ref 4.0–10.5)
nRBC: 0.2 % (ref 0.0–0.2)

## 2024-03-02 LAB — PSA: Prostatic Specific Antigen: 191.77 ng/mL — ABNORMAL HIGH (ref 0.00–4.00)

## 2024-03-02 LAB — LACTATE DEHYDROGENASE: LDH: 266 U/L — ABNORMAL HIGH (ref 98–192)

## 2024-03-02 NOTE — Assessment & Plan Note (Addendum)
 Discussed goals of care today.  Discussed that his cytopenia, will need recovery before more chemotherapy can be administered.  Discussed that given he has received multiple previous lines of treatment for his metastatic prostate cancer, treatment option is limited at this time point.  His PSA has not normalized.  He will experience progression once treatment discontinue, or unable to receive further treatment.  Once cancer progressed, he likely will suffer with more complications.  Therefore, advance care planning, palliative care consult is very reasonable.  He expressed understanding and agrees with the plan. Plan on palliative care consult at next visit. Referral placed

## 2024-03-02 NOTE — Assessment & Plan Note (Addendum)
 Worsening thrombocytopenia secondary to chemotherapy. Will postpone treatment.

## 2024-03-03 ENCOUNTER — Other Ambulatory Visit: Payer: Self-pay

## 2024-03-03 DIAGNOSIS — C61 Malignant neoplasm of prostate: Secondary | ICD-10-CM

## 2024-03-03 LAB — TESTOSTERONE: Testosterone: <3 ng/dL — ABNORMAL LOW (ref 264–916)

## 2024-03-05 ENCOUNTER — Other Ambulatory Visit: Payer: Self-pay

## 2024-03-07 ENCOUNTER — Telehealth: Payer: Self-pay | Admitting: Nurse Practitioner

## 2024-03-07 NOTE — Telephone Encounter (Signed)
 Scheduled patient for palliative appointment. Called and spoke with the patients wife, she is aware.

## 2024-03-08 ENCOUNTER — Other Ambulatory Visit: Payer: Self-pay

## 2024-03-10 ENCOUNTER — Telehealth: Payer: Self-pay

## 2024-03-10 NOTE — Telephone Encounter (Signed)
 Spoke with the daughter given information from Dr Tina. Does not need to be seen by us  unless the treatment he is receiving from his PCP does not work then we will need to see him. Arland Legions BSN RN

## 2024-03-22 NOTE — Assessment & Plan Note (Signed)
 thrombocytopenia secondary to chemotherapy.

## 2024-03-22 NOTE — Assessment & Plan Note (Signed)
 We will proceed with docetaxel  with carboplatin . Monitor PSA, CEA, LDH Return in 6 weeks with repeat labs Planned cycle 16 December instead of Nov Olaprib in the future. Continue Xgeva  and Leuprolide 

## 2024-03-22 NOTE — Progress Notes (Unsigned)
 [facility] OFFICE PROGRESS NOTE  Patient Care Team: Clinic, Bonni Lien as PCP - General Patrcia Cough, MD as Consulting Physician (Radiation Oncology) Vertell Pont, RN as Oncology Nurse Navigator  Assessment & Plan   No orders of the defined types were placed in this encounter.    Pauletta JAYSON Chihuahua, MD  INTERVAL HISTORY: Patient returns for follow-up.  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  The Common Hereditary Gene Panel offered by [facility] includes sequencing and/or deletion duplication testing of the following 48 genes: APC, ATM, AXIN2, BARD1, BMPR1A, BRCA1, BRCA2, BRIP1, CDH1, CDK4, CDKN2A (p14ARF), CDKN2A (p16INK4a), CHEK2, CTNNA1, DICER1, EPCAM (Deletion/duplication testing only), GREM1 (promoter region deletion/duplication testing only), KIT, MEN1, MLH1, MSH2, MSH3, MSH6, MUTYH, NBN, NF1, NHTL1, PALB2, PDGFRA, PMS2, POLD1, POLE, PTEN, RAD50, RAD51C, RAD51D, RNF43, SDHB, SDHC, SDHD, SMAD4, SMARCA4. STK11, TP53, TSC1, TSC2, and VHL.  The following genes were evaluated for sequence changes only: SDHA and HOXB13 c.251G>A variant only. The report date is Oct 14, 2018.   09/18/2019 Genetic Testing   Foundation One CDx      12/12/2019 Genetic Testing   Guardant 360       12/15/2021 - 01/06/2022 Chemotherapy   Patient is on Treatment Plan : PROSTATE Cabazitaxel  + Prednisone  q21d     12/15/2021 - 09/08/2022 Chemotherapy   Patient is  on Treatment Plan : PROSTATE Cabazitaxel  (20) D1 + Prednisone  D1-21 q21d     11/18/2023 -  Chemotherapy   Patient is on Treatment Plan : PROSTATE Carboplatin  (AUC 4) + Docetaxel  (60) q21d     02/29/2024 Cancer Staging   Staging form: Prostate, AJCC 8th Edition - Clinical: Stage IVB (cTX, cN1, pM1c) - Signed by Chihuahua Pauletta JAYSON, MD on 02/29/2024      PHYSICAL EXAMINATION: ECOG PERFORMANCE STATUS: {CHL ONC ECOG ED:[id number]}  There were no vitals filed for this visit. There were no vitals filed for this visit.  GENERAL: alert, no distress and comfortable SKIN: skin color normal and no jaundice or bruising or petechiae on exposed skin EYES: normal, sclera clear OROPHARYNX: no exudate  NECK: No palpable mass LYMPH:  no palpable cervical, axillary lymphadenopathy  LUNGS: clear to auscultation and no wheeze or rales with normal breathing effort HEART: regular rate & rhythm  ABDOMEN: abdomen soft, non-tender and nondistended. Musculoskeletal: no edema NEURO: no focal motor/sensory deficits  Relevant data reviewed during this visit included labs.  New labs ordered.

## 2024-03-23 ENCOUNTER — Inpatient Hospital Stay

## 2024-03-23 ENCOUNTER — Inpatient Hospital Stay: Admitting: Dietician

## 2024-03-23 ENCOUNTER — Inpatient Hospital Stay (HOSPITAL_BASED_OUTPATIENT_CLINIC_OR_DEPARTMENT_OTHER)

## 2024-03-23 VITALS — BP 144/80 | HR 84 | Temp 97.5°F | Resp 18 | Wt 231.3 lb

## 2024-03-23 DIAGNOSIS — C61 Malignant neoplasm of prostate: Secondary | ICD-10-CM

## 2024-03-23 DIAGNOSIS — G629 Polyneuropathy, unspecified: Secondary | ICD-10-CM

## 2024-03-23 DIAGNOSIS — Z5111 Encounter for antineoplastic chemotherapy: Secondary | ICD-10-CM | POA: Diagnosis not present

## 2024-03-23 DIAGNOSIS — D696 Thrombocytopenia, unspecified: Secondary | ICD-10-CM | POA: Diagnosis not present

## 2024-03-23 DIAGNOSIS — Z9189 Other specified personal risk factors, not elsewhere classified: Secondary | ICD-10-CM | POA: Diagnosis not present

## 2024-03-23 DIAGNOSIS — C771 Secondary and unspecified malignant neoplasm of intrathoracic lymph nodes: Secondary | ICD-10-CM

## 2024-03-23 LAB — PSA: Prostatic Specific Antigen: 228.26 ng/mL — ABNORMAL HIGH (ref 0.00–4.00)

## 2024-03-23 LAB — CBC WITH DIFFERENTIAL (CANCER CENTER ONLY)
Abs Immature Granulocytes: 0.04 K/uL (ref 0.00–0.07)
Basophils Absolute: 0 K/uL (ref 0.0–0.1)
Basophils Relative: 1 %
Eosinophils Absolute: 0 K/uL (ref 0.0–0.5)
Eosinophils Relative: 0 %
HCT: 33.7 % — ABNORMAL LOW (ref 39.0–52.0)
Hemoglobin: 10.9 g/dL — ABNORMAL LOW (ref 13.0–17.0)
Immature Granulocytes: 1 %
Lymphocytes Relative: 7 %
Lymphs Abs: 0.6 K/uL — ABNORMAL LOW (ref 0.7–4.0)
MCH: 31.9 pg (ref 26.0–34.0)
MCHC: 32.3 g/dL (ref 30.0–36.0)
MCV: 98.5 fL (ref 80.0–100.0)
Monocytes Absolute: 0.9 K/uL (ref 0.1–1.0)
Monocytes Relative: 11 %
Neutro Abs: 6.8 K/uL (ref 1.7–7.7)
Neutrophils Relative %: 80 %
Platelet Count: 231 K/uL (ref 150–400)
RBC: 3.42 MIL/uL — ABNORMAL LOW (ref 4.22–5.81)
RDW: 14.2 % (ref 11.5–15.5)
WBC Count: 8.3 K/uL (ref 4.0–10.5)
nRBC: 0 % (ref 0.0–0.2)

## 2024-03-23 LAB — CMP (CANCER CENTER ONLY)
ALT: 7 U/L (ref 0–44)
AST: 11 U/L — ABNORMAL LOW (ref 15–41)
Albumin: 3.6 g/dL (ref 3.5–5.0)
Alkaline Phosphatase: 41 U/L (ref 38–126)
Anion gap: 7 (ref 5–15)
BUN: 20 mg/dL (ref 8–23)
CO2: 33 mmol/L — ABNORMAL HIGH (ref 22–32)
Calcium: 9 mg/dL (ref 8.9–10.3)
Chloride: 104 mmol/L (ref 98–111)
Creatinine: 1.13 mg/dL (ref 0.61–1.24)
GFR, Estimated: >60 mL/min (ref >60)
Glucose, Bld: 114 mg/dL — ABNORMAL HIGH (ref 70–99)
Potassium: 4.1 mmol/L (ref 3.5–5.1)
Sodium: 144 mmol/L (ref 135–145)
Total Bilirubin: 0.7 mg/dL (ref 0.0–1.2)
Total Protein: 6.3 g/dL — ABNORMAL LOW (ref 6.5–8.1)

## 2024-03-23 LAB — LACTATE DEHYDROGENASE: LDH: 271 U/L — ABNORMAL HIGH (ref 98–192)

## 2024-03-23 LAB — CEA (ACCESS): CEA (CHCC): 5.5 ng/mL — ABNORMAL HIGH (ref 0.00–5.00)

## 2024-03-23 MED ORDER — APREPITANT 130 MG/18ML IV EMUL
130.0000 mg | Freq: Once | INTRAVENOUS | Status: AC
  Administered 2024-03-23: 130 mg via INTRAVENOUS
  Filled 2024-03-23: qty 18

## 2024-03-23 MED ORDER — SODIUM CHLORIDE 0.9 % IV SOLN
388.8000 mg | Freq: Once | INTRAVENOUS | Status: AC
  Administered 2024-03-23: 390 mg via INTRAVENOUS
  Filled 2024-03-23: qty 39

## 2024-03-23 MED ORDER — SODIUM CHLORIDE 0.9 % IV SOLN: INTRAVENOUS | Status: DC

## 2024-03-23 MED ORDER — SODIUM CHLORIDE 0.9 % IV SOLN
50.0000 mg/m2 | Freq: Once | INTRAVENOUS | Status: AC
  Administered 2024-03-23: 111 mg via INTRAVENOUS
  Filled 2024-03-23: qty 11.1

## 2024-03-23 MED ORDER — DEXAMETHASONE SOD PHOSPHATE PF 10 MG/ML IJ SOLN
10.0000 mg | Freq: Once | INTRAMUSCULAR | Status: AC
  Administered 2024-03-23: 10 mg via INTRAVENOUS

## 2024-03-23 MED ORDER — PEGFILGRASTIM-CBQV (INF DEV) 6 MG/0.6ML ~~LOC~~ SOSY
6.0000 mg | PREFILLED_SYRINGE | Freq: Once | SUBCUTANEOUS | Status: AC
  Administered 2024-03-23: 6 mg via SUBCUTANEOUS
  Filled 2024-03-23: qty 0.6

## 2024-03-23 MED ORDER — SODIUM CHLORIDE 0.9% FLUSH: 10.0000 mL | INTRAVENOUS | Status: DC | PRN

## 2024-03-23 MED ORDER — PALONOSETRON HCL INJECTION 0.25 MG/5ML
0.2500 mg | Freq: Once | INTRAVENOUS | Status: AC
  Administered 2024-03-23: 0.25 mg via INTRAVENOUS
  Filled 2024-03-23: qty 5

## 2024-03-23 MED ORDER — DENOSUMAB 120 MG/1.7ML ~~LOC~~ SOLN
120.0000 mg | Freq: Once | SUBCUTANEOUS | Status: AC
  Administered 2024-03-23: 120 mg via SUBCUTANEOUS
  Filled 2024-03-23: qty 1.7

## 2024-03-23 NOTE — Assessment & Plan Note (Addendum)
 Stable.  Continue to monitor and treatment as tolerated.

## 2024-03-23 NOTE — Patient Instructions (Signed)
 [facility] - A DEPT OF [facility]  Discharge Instructions: Thank you for choosing [facility] to provide your oncology and hematology care.   If you have a lab appointment with the Cancer Center, please go directly to the Cancer Center and check in at the registration area.   Wear comfortable clothing and clothing appropriate for easy access to any Portacath or PICC line.   We strive to give you quality time with your provider. You may need to reschedule your appointment if you arrive late (15 or more minutes).  Arriving late affects you and other patients whose appointments are after yours.  Also, if you miss three or more appointments without notifying the office, you may be dismissed from the clinic at the provider's discretion.      For prescription refill requests, have your pharmacy contact our office and allow 72 hours for refills to be completed.    Today you received the following chemotherapy and/or immunotherapy agents taxotere  and carboplatin , udencya and xgeva       To help prevent nausea and vomiting after your treatment, we encourage you to take your nausea medication as directed.  BELOW ARE SYMPTOMS THAT SHOULD BE REPORTED IMMEDIATELY: *FEVER GREATER THAN 100.4 F (38 C) OR HIGHER *CHILLS OR SWEATING *NAUSEA AND VOMITING THAT IS NOT CONTROLLED WITH YOUR NAUSEA MEDICATION *UNUSUAL SHORTNESS OF BREATH *UNUSUAL BRUISING OR BLEEDING *URINARY PROBLEMS (pain or burning when urinating, or frequent urination) *BOWEL PROBLEMS (unusual diarrhea, constipation, pain near the anus) TENDERNESS IN MOUTH AND THROAT WITH OR WITHOUT PRESENCE OF ULCERS (sore throat, sores in mouth, or a toothache) UNUSUAL RASH, SWELLING OR PAIN  UNUSUAL VAGINAL DISCHARGE OR ITCHING   Items with * indicate a potential emergency and should be followed up as soon as possible or go to the Emergency Department if any problems should occur.  Please show the  CHEMOTHERAPY ALERT CARD or IMMUNOTHERAPY ALERT CARD at check-in to the Emergency Department and triage nurse.  Should you have questions after your visit or need to cancel or reschedule your appointment, please contact [facility] - A DEPT OF [facility]  Dept: [phone]  and follow the prompts.  Office hours are 8:00 a.m. to 4:30 p.m. Monday - Friday. Please note that voicemails left after 4:00 p.m. may not be returned until the following business day.  We are closed weekends and major holidays. You have access to a nurse at all times for urgent questions. Please call the main number to the clinic Dept: [phone] and follow the prompts.   For any non-urgent questions, you may also contact your provider using MyChart. We now offer e-Visits for anyone 67 and older to request care online for non-urgent symptoms. For details visit [email or web address].   Also download the MyChart app! Go to the app store, search MyChart, open the app, select [place], and log in with your MyChart username and password.

## 2024-03-23 NOTE — Progress Notes (Signed)
 Nutrition Follow-up:  Pt with metastatic prostate cancer (diagnosed 2017) to intrathoracic lymph. He is currently receiving carboplatin  + docetaxel  q21d. Patient under the care of Dr. Tina.  Met with patient and daughter in infusion. RD familiar with family while under the care of Dr. Rogers. Patient reports doing well. His appetite is good. Eating anything he can get his hands on. Patient has been supplementing with Boost jello. No longer drinking Boost/Ensure as these increased blood sugars. He denies NIS at this time.    Medications: reviewed   Labs: reviewed   Anthropometrics: Wt 231 lb 4.8 oz today   10/9 - 231 lb 14.4 oz 9/18 - 223 lb 8 oz 8/28 - 224 lb 12.8 oz    NUTRITION DIAGNOSIS: Unintended wt loss - stable   INTERVENTION:  Continue small frequent meals/snacks with adequate calories and protein Provided recipe for high protein jello that daughter can prepare at home    MONITORING, EVALUATION, GOAL: wt trends, intake   NEXT VISIT: Thursday December 11 during infusion

## 2024-03-23 NOTE — Assessment & Plan Note (Addendum)
 Calcium 1200 mg and vit D 1000 international units daily Xgeva  with visit next visit

## 2024-03-24 ENCOUNTER — Other Ambulatory Visit: Payer: Self-pay

## 2024-03-24 LAB — TESTOSTERONE: Testosterone: <3 ng/dL — ABNORMAL LOW (ref 264–916)

## 2024-03-29 ENCOUNTER — Other Ambulatory Visit: Payer: Self-pay

## 2024-04-10 ENCOUNTER — Inpatient Hospital Stay: Attending: Hematology | Admitting: Nurse Practitioner

## 2024-04-10 DIAGNOSIS — C770 Secondary and unspecified malignant neoplasm of lymph nodes of head, face and neck: Secondary | ICD-10-CM | POA: Insufficient documentation

## 2024-04-10 DIAGNOSIS — R970 Elevated carcinoembryonic antigen [CEA]: Secondary | ICD-10-CM | POA: Insufficient documentation

## 2024-04-10 DIAGNOSIS — Z515 Encounter for palliative care: Secondary | ICD-10-CM

## 2024-04-10 DIAGNOSIS — Z87891 Personal history of nicotine dependence: Secondary | ICD-10-CM | POA: Insufficient documentation

## 2024-04-10 DIAGNOSIS — Z79899 Other long term (current) drug therapy: Secondary | ICD-10-CM | POA: Insufficient documentation

## 2024-04-10 DIAGNOSIS — Z7982 Long term (current) use of aspirin: Secondary | ICD-10-CM | POA: Insufficient documentation

## 2024-04-10 DIAGNOSIS — C7951 Secondary malignant neoplasm of bone: Secondary | ICD-10-CM | POA: Insufficient documentation

## 2024-04-10 DIAGNOSIS — C771 Secondary and unspecified malignant neoplasm of intrathoracic lymph nodes: Secondary | ICD-10-CM | POA: Insufficient documentation

## 2024-04-10 DIAGNOSIS — J449 Chronic obstructive pulmonary disease, unspecified: Secondary | ICD-10-CM | POA: Insufficient documentation

## 2024-04-10 DIAGNOSIS — Z8042 Family history of malignant neoplasm of prostate: Secondary | ICD-10-CM | POA: Insufficient documentation

## 2024-04-10 DIAGNOSIS — Z7409 Other reduced mobility: Secondary | ICD-10-CM

## 2024-04-10 DIAGNOSIS — Z7952 Long term (current) use of systemic steroids: Secondary | ICD-10-CM | POA: Insufficient documentation

## 2024-04-10 DIAGNOSIS — C787 Secondary malignant neoplasm of liver and intrahepatic bile duct: Secondary | ICD-10-CM | POA: Insufficient documentation

## 2024-04-10 DIAGNOSIS — R530 Neoplastic (malignant) related fatigue: Secondary | ICD-10-CM

## 2024-04-10 DIAGNOSIS — Z7189 Other specified counseling: Secondary | ICD-10-CM

## 2024-04-10 DIAGNOSIS — E119 Type 2 diabetes mellitus without complications: Secondary | ICD-10-CM | POA: Insufficient documentation

## 2024-04-10 DIAGNOSIS — C61 Malignant neoplasm of prostate: Secondary | ICD-10-CM | POA: Insufficient documentation

## 2024-04-10 DIAGNOSIS — I10 Essential (primary) hypertension: Secondary | ICD-10-CM | POA: Insufficient documentation

## 2024-04-10 DIAGNOSIS — D696 Thrombocytopenia, unspecified: Secondary | ICD-10-CM | POA: Insufficient documentation

## 2024-04-10 DIAGNOSIS — K5900 Constipation, unspecified: Secondary | ICD-10-CM | POA: Diagnosis not present

## 2024-04-10 NOTE — Progress Notes (Unsigned)
 Palliative Medicine [facility]  [phone] [phone]   Name: Andre [name] Date: 04/10/2024 MRN: [id number]  DOB: 12-23-1940  Patient Care Team: Clinic, Bonni Lien as PCP - General Patrcia Cough, MD as Consulting Physician (Radiation Oncology) Vertell Pont, RN as Oncology Nurse Navigator   I connected with Andre [name] on 04/10/24 at 11:00 AM EST by Video Visit and verified that I am speaking with the correct person using two identifiers.   I discussed the limitations, risks, security and privacy concerns of performing an evaluation and management service by telemedicine and the availability of in-person appointments. I also discussed with the patient that there may be a patient responsible charge related to this service. The patient expressed understanding and agreed to proceed.   Other persons participating in the visit and their role in the encounter: daughter, Andre [name]   Patient's location: Home   Provider's location: [facility]   REASON FOR CONSULTATION: Andre [name] is a 83 y.o. male with oncologic medical history including metastatic prostate cancer with left supraclavicular and mediastinal lymph node involvement currently undergoing treatment with Doxil Taxol and carboplatin .  Palliative is seeing patient for symptom management and goals of care.    SOCIAL HISTORY:     reports that he quit smoking about 9 years ago. His smoking use included cigarettes. He started smoking about 69 years ago. He has a 15 pack-year smoking history. He has never used smokeless tobacco. He reports that he does not currently use alcohol. He reports that he does not use drugs.  ADVANCE DIRECTIVES:  Patient has a documented advanced directive (not on file).  Daughter plans to bring in a copy next visit.  Confirms his daughter Andre [name] and his brother is his museum/gallery exhibitions officer.  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  The Common Hereditary Gene Panel offered by [facility] includes sequencing and/or deletion duplication testing of the following 48 genes: APC, ATM, AXIN2, BARD1, BMPR1A, BRCA1, BRCA2, BRIP1, CDH1, CDK4, CDKN2A (p14ARF), CDKN2A (p16INK4a), CHEK2, CTNNA1, DICER1, EPCAM (Deletion/duplication testing only), GREM1 (promoter region deletion/duplication testing only), KIT, MEN1, MLH1, MSH2, MSH3, MSH6, MUTYH, NBN, NF1, NHTL1, PALB2, PDGFRA, PMS2, POLD1, POLE, PTEN, RAD50, RAD51C, RAD51D, RNF43, SDHB, SDHC, SDHD, SMAD4, SMARCA4. STK11, TP53, TSC1, TSC2, and VHL.  The following genes were evaluated for sequence changes only: SDHA and HOXB13 c.251G>A variant only. The report date is Oct 14, 2018.   09/18/2019 Genetic Testing   Foundation One CDx  12/12/2019 Genetic Testing   Guardant 360       12/15/2021 - 01/06/2022 Chemotherapy   Patient is on Treatment Plan : PROSTATE Cabazitaxel  + Prednisone  q21d     12/15/2021 - 09/08/2022 Chemotherapy   Patient is on  Treatment Plan : PROSTATE Cabazitaxel  (20) D1 + Prednisone  D1-21 q21d     11/18/2023 -  Chemotherapy   Patient is on Treatment Plan : PROSTATE Carboplatin  (AUC 4) + Docetaxel  (60) q21d     02/29/2024 Cancer Staging   Staging form: Prostate, AJCC 8th Edition - Clinical: Stage IVB (cTX, cN1, pM1c) - Signed by Tina Pauletta BROCKS, MD on 02/29/2024     ALLERGIES:  is allergic to semaglutide, lisinopril, and metoprolol.  MEDICATIONS:  Current Outpatient Medications  Medication Sig Dispense Refill   ACCU-CHEK GUIDE test strip      Accu-Chek Softclix Lancets lancets      albuterol  (VENTOLIN  HFA) 108 (90 Base) MCG/ACT inhaler Inhale 2 puffs into the lungs every 4 (four) hours as needed for wheezing or shortness of breath.     aspirin 81 MG EC tablet Take 1 tablet by mouth daily.     atorvastatin  (LIPITOR) 10 MG tablet TAKE 1 TABLET BY MOUTH EVERY DAY 90 tablet 3   Blood Glucose Monitoring Suppl (ACCU-CHEK GUIDE) w/Device KIT      bumetanide  (BUMEX ) 1 MG tablet TAKE 1 AND 1/2 TABLETS EVERY DAY (SUBSTITUTED FOR BUMEX ) 135 tablet 3   CALCIUM PO Take 600 mg by mouth daily.      Cholecalciferol (VITAMIN D3) 50 MCG (2000 UT) capsule Take 2,000 Units by mouth daily.     dorzolamide  (TRUSOPT ) 2 % ophthalmic solution Place 1 drop into both eyes 3 (three) times daily.     erythromycin ophthalmic ointment Place 1 Application into both eyes 3 (three) times daily as needed (eye itching).     ferrous sulfate 325 (65 FE) MG tablet Take 1 tablet by mouth daily.     latanoprost  (XALATAN ) 0.005 % ophthalmic solution Place 1 drop into both eyes at bedtime.     Latanoprostene Bunod  (VYZULTA ) 0.024 % SOLN Place 1 drop into both eyes at bedtime.     Leuprolide  Acetate, 4 Month, (ELIGARD ) 30 MG injection Inject 30 mg into the skin every 4 (four) months.     lidocaine -prilocaine  (EMLA ) cream Apply 1 Application topically as needed. 30 g 0   loperamide  (IMODIUM ) 2 MG capsule Take 1 capsule (2 mg total) by mouth 4 (four)  times daily as needed for diarrhea or loose stools. 12 capsule 0   losartan  (COZAAR ) 100 MG tablet Take 1 tablet by mouth daily.     magnesium  oxide (MAG-OX) 400 MG tablet Take 1 tablet (400 mg total) by mouth daily. 90 tablet 2   megestrol  (MEGACE ) 40 MG/ML suspension Take 10 mLs (400 mg total) by mouth 2 (two) times daily. 480 mL 0   montelukast (SINGULAIR) 10 MG tablet Take 10 mg by mouth at bedtime.     Multiple Vitamins-Minerals (PRESERVISION AREDS PO) Take 1 tablet by mouth daily.     potassium chloride  (MICRO-K ) 10 MEQ CR capsule TAKE 4 CAPSULE BY MOUTH DAILY WITH FOOD, OPEN AND SPRINKLE ON FOOD 348 capsule 2   prochlorperazine  (COMPAZINE ) 10 MG tablet Take 1 tablet (10 mg total) by mouth every 6 (six) hours as needed for nausea or vomiting. 60 tablet 3   tamsulosin  (FLOMAX ) 0.4 MG CAPS capsule Take 1 tablet by mouth daily.     TRELEGY ELLIPTA  200-62.5-25  MCG/ACT AEPB Take 1 puff by mouth daily.     vitamin C (ASCORBIC ACID) 500 MG tablet Take 500 mg by mouth daily.     No current facility-administered medications for this visit.   Facility-Administered Medications Ordered in Other Visits  Medication Dose Route Frequency Provider Last Rate Last Admin   denosumab  (XGEVA ) 120 MG/1.7ML injection            diphenhydrAMINE  (BENADRYL ) 50 MG/ML injection            famotidine  (PEPCID ) 20-0.9 MG/50ML-% IVPB             VITAL SIGNS: There were no vitals taken for this visit. There were no vitals filed for this visit.  Estimated body mass index is 34.16 kg/m as calculated from the following:   Height as of 03/02/24: 5' 9 (1.753 m).   Weight as of 03/23/24: 231 lb 4.8 oz (104.9 kg).  LABS: CBC:    Component Value Date/Time   WBC 8.3 03/23/2024 0820   WBC 7.5 01/20/2024 1040   HGB 10.9 (L) 03/23/2024 0820   HCT 33.7 (L) 03/23/2024 0820   PLT 231 03/23/2024 0820   MCV 98.5 03/23/2024 0820   NEUTROABS 6.8 03/23/2024 0820   LYMPHSABS 0.6 (L) 03/23/2024 0820   MONOABS 0.9 03/23/2024  0820   EOSABS 0.0 03/23/2024 0820   BASOSABS 0.0 03/23/2024 0820   Comprehensive Metabolic Panel:    Component Value Date/Time   NA 144 03/23/2024 0820   K 4.1 03/23/2024 0820   CL 104 03/23/2024 0820   CO2 33 (H) 03/23/2024 0820   BUN 20 03/23/2024 0820   CREATININE 1.13 03/23/2024 0820   GLUCOSE 114 (H) 03/23/2024 0820   CALCIUM 9.0 03/23/2024 0820   AST 11 (L) 03/23/2024 0820   ALT 7 03/23/2024 0820   ALKPHOS 41 03/23/2024 0820   BILITOT 0.7 03/23/2024 0820   PROT 6.3 (L) 03/23/2024 0820   ALBUMIN 3.6 03/23/2024 0820    RADIOGRAPHIC STUDIES: No results found.  PERFORMANCE STATUS (ECOG) : 1 - Symptomatic but completely ambulatory  Review of Systems  Constitutional:  Positive for activity change and fatigue.  Unless otherwise noted, a complete review of systems is negative.  Physical Exam General: NAD Pulmonary: Normal breathing pattern Neurological: Alert and oriented x3  Discussed the use of AI scribe software for clinical note transcription with the patient, who gave verbal consent to proceed.  History of Present Illness ABIR CRAINE is an 83 year old male who I connected with via video for his initial palliative visit.  He is accompanied by his daughter Andre [name].  Patient is alert and able to engage appropriately in discussions.    I introduced myself, Maygan RN, and Palliative's role in collaboration with the oncology team. Concept of Palliative Care was introduced as specialized medical care for people and their families living with serious illness.  It focuses on providing relief from the symptoms and stress of a serious illness.  The goal is to improve quality of life for both the patient and the family. Values and goals of care important to patient and family were attempted to be elicited.  Andre [name] lives in the home with his daughter Andre [name]. He is a retired freight forwarder. Andre [name] is his only child.   He reports having no problems sleeping  at night and takes naps during the day. His energy level allows him to move around when he wants, although he uses a cane for mobility. He is mostly  independent in activities of daily living but occasionally requires assistance due to fatigue.   He has a good appetite with no issues of nausea or vomiting. Occasional constipation is effectively managed with a stool softener. His weight has remained stable at 231 pounds over recent visits.There are no concerns with pain, and he has not experienced any recent falls.  Goals of Care We discussed Andre [name] current illness and what it means in the larger context of his on-going co-morbidities. Natural disease trajectory and expectations were discussed.  Patient and his daughter are realistic in their understanding of current cancer diagnosis and overall prognosis. They are clear in expressed wishes to continue to treat the treatable allowing every opportunity for Andre [name] to continue thriving. His quality of life is most important.   I empathetically approached discussions on healthcare limitations, goals of care, code status, and advanced directives. He has completed advanced care planning documents, including healthcare power of attorney and living will. His wishes are to be treated as far as possible, with family making decisions if there is no improvement. He is open to artificial feeding tubes for a defined trial period if he cannot eat or drink. His daughter Andre [name] and brother Andre [name] is his medical decision makers. Daughter plans to bring documents in at follow-up to place in chart.   I discussed the importance of continued conversation with family and their medical providers regarding overall plan of care and treatment options, ensuring decisions are within the context of the patients values and GOCs.  Assessment & Plan Established therapeutic relationship. Education provided on palliative's role in collaboration with their Oncology/Radiation  team.  Constipation Intermittent constipation managed effectively with stool softeners. - Continue current stool softener regimen as needed.  Impaired mobility requiring cane Impaired mobility requiring the use of a cane for ambulation. No recent falls reported. Able to perform most activities of daily living independently, with occasional assistance for tasks such as washing his back. - Continue use of cane for ambulation.  No uncontrolled symptoms  Goals of Care/Advanced Care Planning Extensive goals of care discussions. Patient and daughter realistic in their understanding. Quality of life is most important -Patient has a documented advanced directive. Daughter to bring in at follow-up -See discussions above -His daughter Andre [name] is his documented HCPOA  I will plan to see patient back in clinic in 3-4 weeks. Sooner if needed.   Patient expressed understanding and was in agreement with this plan. He also understands that He can call the clinic at any time with any questions, concerns, or complaints.   Thank you for your referral and allowing Palliative to assist in Andre [name]'s care.   Number and complexity of problems addressed: HIGH - 1 or more chronic illnesses with SEVERE exacerbation, progression, or side effects of treatment - advanced cancer, pain. Any controlled substances utilized were prescribed in the context of palliative care.  I personally spent a total of 65 minutes in the care of the patient today including preparing to see the patient, getting/reviewing separately obtained history, performing a medically appropriate exam/evaluation, counseling and educating, referring and communicating with other health care professionals, documenting clinical information in the EHR, independently interpreting results, and coordinating care.  Visit consisted of counseling and education dealing with the complex and emotionally intense issues of symptom management and palliative  care in the setting of serious and potentially life-threatening illness.  Signed by: Levon Borer, AGPCNP-BC Palliative Medicine Team/[facility]

## 2024-04-11 ENCOUNTER — Encounter: Payer: Self-pay | Admitting: Oncology

## 2024-04-11 ENCOUNTER — Encounter (HOSPITAL_COMMUNITY): Payer: Self-pay | Admitting: Oncology

## 2024-04-11 ENCOUNTER — Encounter: Payer: Self-pay | Admitting: Nurse Practitioner

## 2024-04-12 ENCOUNTER — Emergency Department (HOSPITAL_COMMUNITY)

## 2024-04-12 ENCOUNTER — Other Ambulatory Visit: Payer: Self-pay

## 2024-04-12 ENCOUNTER — Emergency Department (HOSPITAL_COMMUNITY)
Admission: EM | Admit: 2024-04-12 | Discharge: 2024-04-13 | Disposition: A | Discharge status: Home / Self Care | Attending: Emergency Medicine | Admitting: Emergency Medicine

## 2024-04-12 ENCOUNTER — Encounter (HOSPITAL_COMMUNITY): Payer: Self-pay

## 2024-04-12 DIAGNOSIS — Z79899 Other long term (current) drug therapy: Secondary | ICD-10-CM | POA: Insufficient documentation

## 2024-04-12 DIAGNOSIS — R600 Localized edema: Secondary | ICD-10-CM | POA: Insufficient documentation

## 2024-04-12 DIAGNOSIS — Z7982 Long term (current) use of aspirin: Secondary | ICD-10-CM | POA: Insufficient documentation

## 2024-04-12 DIAGNOSIS — J449 Chronic obstructive pulmonary disease, unspecified: Secondary | ICD-10-CM | POA: Insufficient documentation

## 2024-04-12 DIAGNOSIS — I10 Essential (primary) hypertension: Secondary | ICD-10-CM | POA: Insufficient documentation

## 2024-04-12 DIAGNOSIS — E119 Type 2 diabetes mellitus without complications: Secondary | ICD-10-CM | POA: Insufficient documentation

## 2024-04-12 DIAGNOSIS — D696 Thrombocytopenia, unspecified: Secondary | ICD-10-CM | POA: Insufficient documentation

## 2024-04-12 LAB — BASIC METABOLIC PANEL WITH GFR
Anion gap: 9 (ref 5–15)
BUN: 14 mg/dL (ref 8–23)
CO2: 30 mmol/L (ref 22–32)
Calcium: 9.3 mg/dL (ref 8.9–10.3)
Chloride: 107 mmol/L (ref 98–111)
Creatinine, Ser: 1.08 mg/dL (ref 0.61–1.24)
GFR, Estimated: >60 mL/min (ref >60)
Glucose, Bld: 120 mg/dL — ABNORMAL HIGH (ref 70–99)
Potassium: 5.2 mmol/L — ABNORMAL HIGH (ref 3.5–5.1)
Sodium: 145 mmol/L (ref 135–145)

## 2024-04-12 LAB — CBC
HCT: 33.5 % — ABNORMAL LOW (ref 39.0–52.0)
Hemoglobin: 10.4 g/dL — ABNORMAL LOW (ref 13.0–17.0)
MCH: 31.7 pg (ref 26.0–34.0)
MCHC: 31 g/dL (ref 30.0–36.0)
MCV: 102.1 fL — ABNORMAL HIGH (ref 80.0–100.0)
Platelets: 134 K/uL — ABNORMAL LOW (ref 150–400)
RBC: 3.28 MIL/uL — ABNORMAL LOW (ref 4.22–5.81)
RDW: 14.5 % (ref 11.5–15.5)
WBC: 9.1 K/uL (ref 4.0–10.5)
nRBC: 0.2 % (ref 0.0–0.2)

## 2024-04-12 LAB — PRO BRAIN NATRIURETIC PEPTIDE: Pro Brain Natriuretic Peptide: 210 pg/mL (ref <300.0)

## 2024-04-12 NOTE — ED Notes (Signed)
 Pt resting comfortably and easily aroused. Pt daughter at bedside.

## 2024-04-12 NOTE — ED Notes (Signed)
 Patient transported to X-ray

## 2024-04-12 NOTE — ED Triage Notes (Addendum)
 Pt reports swelling to right leg x 2-3 days. +4 pitting edema. Left leg is also swollen but R > L. Denies chest pain or acute shortness of breath. Denies any other symptoms. He is being treated for prostate cancer at the cancer center. Last Chemo was 3 weeks ago.

## 2024-04-13 ENCOUNTER — Other Ambulatory Visit (HOSPITAL_COMMUNITY): Payer: Self-pay

## 2024-04-13 ENCOUNTER — Encounter: Payer: Self-pay | Admitting: Student-PharmD

## 2024-04-13 ENCOUNTER — Ambulatory Visit (HOSPITAL_COMMUNITY)
Admission: RE | Admit: 2024-04-13 | Discharge: 2024-04-13 | Disposition: A | Discharge status: Home / Self Care | Source: Ambulatory Visit | Attending: Emergency Medicine | Admitting: Emergency Medicine

## 2024-04-13 ENCOUNTER — Encounter (HOSPITAL_COMMUNITY): Payer: Self-pay | Admitting: Oncology

## 2024-04-13 ENCOUNTER — Ambulatory Visit (INDEPENDENT_AMBULATORY_CARE_PROVIDER_SITE_OTHER): Admitting: Student-PharmD

## 2024-04-13 ENCOUNTER — Encounter: Payer: Self-pay | Admitting: Oncology

## 2024-04-13 ENCOUNTER — Telehealth: Payer: Self-pay

## 2024-04-13 VITALS — BP 125/75

## 2024-04-13 DIAGNOSIS — I82411 Acute embolism and thrombosis of right femoral vein: Secondary | ICD-10-CM | POA: Insufficient documentation

## 2024-04-13 MED ORDER — ENOXAPARIN SODIUM 100 MG/ML IJ SOSY
100.0000 mg | PREFILLED_SYRINGE | Freq: Once | INTRAMUSCULAR | Status: AC
  Administered 2024-04-13: 100 mg via SUBCUTANEOUS
  Filled 2024-04-13: qty 1

## 2024-04-13 MED ORDER — APIXABAN (ELIQUIS) VTE STARTER PACK (10MG AND 5MG)
ORAL_TABLET | ORAL | 0 refills | Status: DC
  Filled 2024-04-13: qty 74, 30d supply, fill #0

## 2024-04-13 NOTE — Progress Notes (Signed)
 ED Pharmacy Consult Sign Off An pharmacy consult was received from an ED provider for Lovenox per pharmacy dosing for r/o DVT. A chart review was completed to assess appropriateness.   The following one time order(s) were placed:  Lovenox 100mg  sq x1  Noted plan for patient to return in the morning for LE doppler study to look for DVT.     Thank you for allowing pharmacy to be a part of this patient's care.   Rosaline Millet, [facility]  Clinical Pharmacist 04/13/24 12:30 AM

## 2024-04-13 NOTE — Progress Notes (Signed)
 DVT Clinic Note  Name: Andre [name]     MRN: [id number]     DOB: Jun 29, 1940     Sex: male  PCP: [facility]  Today's Visit: Visit Information: Initial Visit  Referred to DVT Clinic by: Emergency Department - Nancyann Kin, MD Referred to CPP by: Dr. Lanis Reason for referral:  Chief Complaint  Patient presents with   DVT   HISTORY OF PRESENT ILLNESS: Andre [name] is a 83 y.o. male with PMH of metastatic prostate cancer, COPD, HTN, COPD, and HLD who presents with his daughter for medication management after diagnosis of DVT. Today, patient and daughter report significant swelling in his right leg. Patient denies pain in his right leg. Also denies chest pain or shortness of breath. Daughter reports the patient typically has swelling in both legs that comes on in the morning and resolves before the end of the day. Daughter noticed about a week ago that the swelling became persistent and was worse in his right leg so she took him to the ED 04/12/24. Vascular imaging was closed for the day so he was given a dose of Lovenox and returned today for outpatient ultrasound. Patient was found to have an acute DVT extending from his right common femoral vein through his calf veins. No thrombus seen in his iliac vein. Patient has no personal history of prior DVT but his daughter has had a DVT and is on lifelong anticoagulation. Daughter reports the patient is largely immobile at baseline (only gets up to go to the bathroom and the kitchen) and this has not changed in the preceding weeks. Daughter reports the patient does wear knee-high compression stockings at home but reports they sometimes slide down his leg. She requested he be measured for thigh-high stockings today. Patient previously took aspirin but per patient's daughter this was discontinued by a doctor recently.   Positive Thrombotic Risk Factors: Active cancer, Older Age, Obesity, Other (comment) (little to no mobility at  baseline) Bleeding Risk Factors: Age >65 years, Cancer  Negative Thrombotic Risk Factors: Previous VTE, Recent surgery (within 3 months), Recent trauma (within 3 months), Recent admission to hospital with acute illness (within 3 months), Paralysis, paresis, or recent plaster cast immobilization of lower extremity, Central venous catheterization, Bed rest >72 hours within 3 months, Sedentary journey lasting >8 hours within 4 weeks, Pregnancy, Within 6 weeks postpartum, Recent cesarean section (within 3 months), Estrogen therapy, Testosterone  therapy, Erythropoiesis-stimulating agent, Recent COVID diagnosis (within 3 months), Non-malignant, chronic inflammatory condition, Known thrombophilic condition, Smoking  Rx Insurance Coverage: Medicare Aetna Rx Affordability: Eliquis  $47 for 30-day supply through [facility]. Medication might be cheaper through the [place] Rx Assistance Provided: Free 30-day trial card Preferred Pharmacy: Eliquis  starter pack filled at [facility]. Discussed with daughter that the Eliquis  might be cheaper through the [place] but per patient's daughter a prescription would need to be sent to a [place] doctor that would prescribe the medication and have it filled at a [place] pharmacy. They will discuss with [facility].   Past Medical History:  Diagnosis Date   COPD (chronic obstructive pulmonary disease) (HCC)    Diabetes mellitus without complication (HCC)    Family history of ovarian cancer    Family history of prostate cancer    Hypertension    Prostate cancer [facility])     Past Surgical History:  Procedure Laterality Date   GOLD SEED IMPLANT      Social History   Socioeconomic History  Marital status: Widowed    Spouse name: Not on file   Number of children: Not on file   Years of education: Not on file   Highest education level: Not on file  Occupational History   Not on file  Tobacco Use   Smoking status: Former    Current packs/day: 0.00    Average  packs/day: 0.3 packs/day for 60.0 years (15.0 ttl pk-yrs)    Types: Cigarettes    Start date: 06/25/1954    Quit date: 06/25/2014    Years since quitting: 9.8   Smokeless tobacco: Never   Tobacco comments:    smoked since young age, quit in 2016  Substance and Sexual Activity   Alcohol use: Not Currently   Drug use: No   Sexual activity: Not Currently  Other Topics Concern   Not on file  Social History Narrative   Not on file   Social Drivers of Health   Financial Resource Strain: Low Risk (08/08/2020)   Received from [facility]   Overall Financial Resource Strain (CARDIA)    Difficulty of Paying Living Expenses: Not hard at all  Food Insecurity: No Food Insecurity (09/30/2023)   Hunger Vital Sign    Worried About Running Out of Food in the Last Year: Never true    Ran Out of Food in the Last Year: Never true  Transportation Needs: No Transportation Needs (09/30/2023)   PRAPARE - Administrator, Civil Service (Medical): No    Lack of Transportation (Non-Medical): No  Physical Activity: Insufficiently Active (05/21/2020)   Exercise Vital Sign    Days of Exercise per Week: 2 days    Minutes of Exercise per Session: 20 min  Stress: No Stress Concern Present (05/21/2020)   [employer] of Occupational Health - Occupational Stress Questionnaire    Feeling of Stress : Not at all  Social Connections: Moderately Isolated (05/21/2020)   Social Connection and Isolation Panel    Frequency of Communication with Friends and Family: More than three times a week    Frequency of Social Gatherings with Friends and Family: More than three times a week    Attends Religious Services: 1 to 4 times per year    Active Member of [employer] or Organizations: No    Attends Banker Meetings: Never    Marital Status: Widowed  Intimate Partner Violence: Not At Risk (09/30/2023)   Humiliation, Afraid, Rape, and Kick questionnaire    Fear of Current or Ex-Partner: No     Emotionally Abused: No    Physically Abused: No    Sexually Abused: No    Family History  Problem Relation Age of Onset   Ovarian cancer Mother 7   Heart attack Father    Prostate cancer Maternal Uncle 4   Cancer Cousin        pat cousin with unknown cancer   Colon cancer Neg Hx    Pancreatic cancer Neg Hx    Breast cancer Neg Hx     Allergies as of 04/13/2024 - Review Complete 04/13/2024  Allergen Reaction Noted   Semaglutide Nausea And Vomiting 08/27/2022   Lisinopril Cough 02/27/2021   Metoprolol Cough 08/19/2018    Current Outpatient Medications on File Prior to Visit  Medication Sig Dispense Refill   albuterol  (VENTOLIN  HFA) 108 (90 Base) MCG/ACT inhaler Inhale 2 puffs into the lungs every 4 (four) hours as needed for wheezing or shortness of breath.     atorvastatin  (LIPITOR) 10 MG tablet  TAKE 1 TABLET BY MOUTH EVERY DAY 90 tablet 3   azelastine (ASTELIN) 0.1 % nasal spray Place 2 sprays into both nostrils 2 (two) times daily.     bumetanide  (BUMEX ) 1 MG tablet TAKE 1 AND 1/2 TABLETS EVERY DAY (SUBSTITUTED FOR BUMEX ) 135 tablet 3   CALCIUM PO Take 600 mg by mouth daily.      CALMOSEPTINE 0.44-20.6 % OINT Apply 1 Application topically daily as needed.     Cholecalciferol (VITAMIN D3) 50 MCG (2000 UT) capsule Take 2,000 Units by mouth daily.     dorzolamide  (TRUSOPT ) 2 % ophthalmic solution Place 1 drop into both eyes 3 (three) times daily.     erythromycin ophthalmic ointment Place 1 Application into both eyes 3 (three) times daily as needed (eye itching).     ferrous sulfate 325 (65 FE) MG tablet Take 1 tablet by mouth daily.     latanoprost  (XALATAN ) 0.005 % ophthalmic solution Place 1 drop into both eyes at bedtime.     Latanoprostene Bunod  (VYZULTA ) 0.024 % SOLN Place 1 drop into both eyes at bedtime.     Leuprolide  Acetate, 4 Month, (ELIGARD ) 30 MG injection Inject 30 mg into the skin every 4 (four) months.     lidocaine -prilocaine  (EMLA ) cream Apply 1 Application  topically as needed. 30 g 0   loperamide  (IMODIUM ) 2 MG capsule Take 1 capsule (2 mg total) by mouth 4 (four) times daily as needed for diarrhea or loose stools. 12 capsule 0   losartan  (COZAAR ) 100 MG tablet Take 1 tablet by mouth daily.     magnesium  oxide (MAG-OX) 400 MG tablet Take 1 tablet (400 mg total) by mouth daily. (Patient taking differently: Take 400 mg by mouth 2 (two) times daily.) 90 tablet 2   megestrol  (MEGACE ) 40 MG/ML suspension Take 10 mLs (400 mg total) by mouth 2 (two) times daily. 480 mL 0   montelukast (SINGULAIR) 10 MG tablet Take 10 mg by mouth at bedtime.     Multiple Vitamins-Minerals (PRESERVISION AREDS PO) Take 1 tablet by mouth daily.     potassium chloride  (MICRO-K ) 10 MEQ CR capsule TAKE 4 CAPSULE BY MOUTH DAILY WITH FOOD, OPEN AND SPRINKLE ON FOOD 348 capsule 2   prochlorperazine  (COMPAZINE ) 10 MG tablet Take 1 tablet (10 mg total) by mouth every 6 (six) hours as needed for nausea or vomiting. 60 tablet 3   tamsulosin  (FLOMAX ) 0.4 MG CAPS capsule Take 1 tablet by mouth daily.     TRELEGY ELLIPTA  200-62.5-25 MCG/ACT AEPB Take 1 puff by mouth daily.     vitamin C (ASCORBIC ACID) 500 MG tablet Take 500 mg by mouth daily.     ACCU-CHEK GUIDE test strip      Accu-Chek Softclix Lancets lancets      albuterol  (PROVENTIL ) (2.5 MG/3ML) 0.083% nebulizer solution Take 2.5 mg by nebulization every 4 (four) hours as needed for shortness of breath.     Blood Glucose Monitoring Suppl (ACCU-CHEK GUIDE) w/Device KIT      cetirizine  (ZYRTEC ) 10 MG tablet Take 10 mg by mouth daily.     Current Facility-Administered Medications on File Prior to Visit  Medication Dose Route Frequency Provider Last Rate Last Admin   denosumab  (XGEVA ) 120 MG/1.7ML injection            diphenhydrAMINE  (BENADRYL ) 50 MG/ML injection            famotidine  (PEPCID ) 20-0.9 MG/50ML-% IVPB            REVIEW  OF SYSTEMS:  Review of Systems  Respiratory:  Negative for shortness of breath.   Cardiovascular:   Positive for leg swelling. Negative for chest pain.  All other systems reviewed and are negative.  PHYSICAL EXAMINATION:  Vitals:   04/13/24 0957  BP: 125/75  SpO2: 98%   Physical Exam Vitals reviewed.  Pulmonary:     Effort: Pulmonary effort is normal. No respiratory distress.  Musculoskeletal:        General: Swelling present.     Right lower leg: Edema present.  Neurological:     Mental Status: He is alert.   Villalta Score for Post-Thrombotic Syndrome: Pain: Absent Cramps: Absent Heaviness: Moderate Paresthesia: Absent Pruritus: Absent Pretibial Edema: Severe Skin Induration: Absent Hyperpigmentation: Absent Redness: Absent Venous Ectasia: Absent Pain on calf compression: Absent Villalta Preliminary Score: 5 Is venous ulcer present?: No If venous ulcer is present and score is <15, then 15 points total are assigned: Absent Villalta Total Score: 5  LABS:  CBC     Component Value Date/Time   WBC 9.1 04/12/2024 2059   RBC 3.28 (L) 04/12/2024 2059   HGB 10.4 (L) 04/12/2024 2059   HGB 10.9 (L) 03/23/2024 0820   HCT 33.5 (L) 04/12/2024 2059   PLT 134 (L) 04/12/2024 2059   PLT 231 03/23/2024 0820   MCV 102.1 (H) 04/12/2024 2059   MCH 31.7 04/12/2024 2059   MCHC 31.0 04/12/2024 2059   RDW 14.5 04/12/2024 2059   LYMPHSABS 0.6 (L) 03/23/2024 0820   MONOABS 0.9 03/23/2024 0820   EOSABS 0.0 03/23/2024 0820   BASOSABS 0.0 03/23/2024 0820    Hepatic Function      Component Value Date/Time   PROT 6.3 (L) 03/23/2024 0820   ALBUMIN 3.6 03/23/2024 0820   AST 11 (L) 03/23/2024 0820   ALT 7 03/23/2024 0820   ALKPHOS 41 03/23/2024 0820   BILITOT 0.7 03/23/2024 0820    Renal Function   Lab Results  Component Value Date   CREATININE 1.08 04/12/2024   CREATININE 1.13 03/23/2024   CREATININE 0.97 03/02/2024    Estimated Creatinine Clearance: 59.6 mL/min (by C-G formula based on SCr of 1.08 mg/dL).   [facility] Vascular Lab Studies:  04/13/24: VAS US  LOWER EXTREMITY  DUPLEX +--------+---------------+---------+-----------+----------+----------------  ----+  RIGHT  CompressibilityPhasicitySpontaneityPropertiesThrombus Aging         +--------+---------------+---------+-----------+----------+----------------  ----+  CFV    None           Yes      Yes                  Acute                  +--------+---------------+---------+-----------+----------+----------------  ----+  SFJ    None                                         Acute                  +--------+---------------+---------+-----------+----------+----------------  ----+  FV Prox None                                         Acute                  +--------+---------------+---------+-----------+----------+----------------  ----+  FV Mid  None           No       No                   Acute                  +--------+---------------+---------+-----------+----------+----------------  ----+  FV     None           No       No                   Acute                  Distal                                                                      +--------+---------------+---------+-----------+----------+----------------  ----+  PFV    None                                         Acute                  +--------+---------------+---------+-----------+----------+----------------  ----+  POP    None           No       No                   Acute                  +--------+---------------+---------+-----------+----------+----------------  ----+  PTV    None                                                                +--------+---------------+---------+-----------+----------+----------------  ----+  PERO                                                Not well  visualized   +--------+---------------+---------+-----------+----------+----------------  ----+  EIV                   Yes      Yes                  patent by  color                                                             doppler                +--------+---------------+---------+-----------+----------+----------------  ----+   +----+---------------+---------+-----------+----------+--------------+  LEFTCompressibilityPhasicitySpontaneityPropertiesThrombus Aging  +----+---------------+---------+-----------+----------+--------------+  CFV Full  Yes      Yes                                  +----+---------------+---------+-----------+----------+--------------+   Summary:  RIGHT:  - Findings consistent with acute deep vein thrombosis involving the right common femoral vein, SF junction, right femoral vein, right proximal profunda vein, right popliteal vein, and right posterior tibial veins.    - No cystic structure found in the popliteal fossa.    LEFT:  - No evidence of common femoral vein obstruction.   ASSESSMENT: Location of DVT: Right common femoral vein, Right femoral vein, Right popliteal vein, Right distal vein Cause of DVT: provoked by a persistent risk factor  Patient with no history of DVT found to have acute DVT extending from his right common femoral vein through his calf veins. On exam, his right calf and foot are swollen and erythematous but not painful to palpation. No symptoms concerning for PE. Due to his DVT extending to his common femoral vein, discussed with Dr. Lanis who feels no vascular intervention is required at this time due to the patients baseline immobility and lack of significant swelling or pain in his thigh with no iliac extension. However, per Dr. Lanis, if the patient and daughter wanted to pursue an intervention such as thrombectomy, we could have them return in a couple weeks to assess if his leg symptoms have improved and re-evaluate at that time. Discussed this with the patient and daughter and they declined intervention at this time. Would like to pursue medical management  alone which is appropriate.   Patient has history of thrombocytopenia but platelets in the ED yesterday were 134K. No contraindication to starting anticoagulation but will need close monitoring with oncology. Started patient on Eliquis  starter pack today and will defer duration of anticoagulation to oncologist given persistent provoking risk factor of cancer. I will reach out to patient's oncologist Dr. Tina to make him aware of patient's new DVT diagnosis. Patient's next visit with him is 05/04/24 and they will be able to provide refills. No drug interactions with Eliquis  and his chemotherapy. Patient received a dose of Lovenox 1 mg/kg around 12:30 am today in the ED. Instructed the patient to take first dose of Eliquis  today around noon, which would be 12 hours after the last Lovenox dose. Measured the patient for thigh-high compression stockings and counseled on their use. Counseled patient and daughter to elevate his leg to reduce swelling and make it easier to get compression stockings on. No barriers to adherence identified. All patient and daughter's questions were answered.   PLAN: -Start apixaban  (Eliquis ) 10 mg twice daily for 7 days followed by 5 mg twice daily. -Expected duration of therapy: per oncology. Therapy started on 04/13/24. -Patient educated on purpose, proper use and potential adverse effects of apixaban  (Eliquis ). -Discussed importance of taking medication around the same time every day. -Advised patient of medications to avoid (NSAIDs, aspirin doses >100 mg daily). -Educated that Tylenol  (acetaminophen ) is the preferred analgesic to lower the risk of bleeding. -Advised patient to alert all providers of anticoagulation therapy prior to starting a new medication or having a procedure. -Emphasized importance of monitoring for signs and symptoms of bleeding (abnormal bruising, prolonged bleeding, nose bleeds, bleeding from gums, discolored urine, black tarry stools). -Educated  patient to present to the ED if emergent signs and symptoms of new thrombosis occur. -Measured patient for compression stockings. -Counseled patient  to wear compression stockings daily, removing at night.  Follow up: with oncology on 05/04/24   [facility] Student Pharmacist  04/13/2024,11:38 AM   Lum Herald, PharmD, [facility], CPP [facility] [phone]

## 2024-04-13 NOTE — ED Provider Notes (Signed)
 [place] EMERGENCY DEPARTMENT AT [facility] Provider Note   CSN: [id number] Arrival date & time: 04/12/24  2008     Patient presents with: No chief complaint on file.   Andre [name] is a 83 y.o. male.   The history is provided by the patient and a relative.   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  Patient has chronic swelling in both legs, but they are typically equal.  He reports over the past 3 days he has had increasing swelling to the right leg.  No trauma.  No fevers or vomiting.  No new chest pain or new shortness of breath.  No previous history of VTE.  He is not on anticoagulation   Past Medical History:  Diagnosis Date   COPD (chronic obstructive pulmonary disease) (HCC)    Diabetes mellitus without complication (HCC)    Family history of ovarian cancer    Family history of prostate cancer    Hypertension    Prostate cancer (HCC)     Prior to Admission medications   Medication Sig Start Date End Date Taking? Authorizing Provider  ACCU-CHEK GUIDE test strip  06/12/19   [provider]  Accu-Chek Softclix Lancets lancets  06/12/19   [provider]  albuterol  (VENTOLIN  HFA) 108 (90 Base) MCG/ACT inhaler Inhale 2 puffs into the lungs every 4 (four) hours as needed for wheezing or shortness of breath. 07/29/23   [provider]  aspirin 81 MG EC tablet Take 1 tablet by mouth daily. 02/27/21   [provider]  atorvastatin  (LIPITOR) 10 MG tablet TAKE 1 TABLET BY MOUTH EVERY DAY 01/27/22   Rogers Hai, MD  Blood Glucose Monitoring Suppl (ACCU-CHEK GUIDE) w/Device KIT  06/12/19   [provider]  bumetanide  (BUMEX ) 1 MG tablet TAKE 1 AND 1/2 TABLETS EVERY DAY (SUBSTITUTED FOR BUMEX ) 12/30/23   Rogers Hai, MD  CALCIUM PO Take 600 mg by mouth daily.     [provider]  Cholecalciferol (VITAMIN D3) 50  MCG (2000 UT) capsule Take 2,000 Units by mouth daily. 05/11/22   [provider]  dorzolamide  (TRUSOPT ) 2 % ophthalmic solution Place 1 drop into both eyes 3 (three) times daily. 06/04/15   [provider]  erythromycin ophthalmic ointment Place 1 Application into both eyes 3 (three) times daily as needed (eye itching). 09/03/22   [provider]  ferrous sulfate 325 (65 FE) MG tablet Take 1 tablet by mouth daily. 02/27/21   [provider]  latanoprost  (XALATAN ) 0.005 % ophthalmic solution Place 1 drop into both eyes at bedtime.    [provider]  Latanoprostene Bunod  (VYZULTA ) 0.024 % SOLN Place 1 drop into both eyes at bedtime.    [provider]  Leuprolide  Acetate, 4 Month, (ELIGARD ) 30 MG injection Inject 30 mg into the skin every 4 (four) months. 02/27/21   [provider]  lidocaine -prilocaine  (EMLA ) cream Apply 1 Application topically as needed. 02/10/24   Tina Pauletta BROCKS, MD  loperamide  (IMODIUM ) 2 MG capsule Take 1 capsule (2 mg total) by mouth 4 (four) times daily as needed for diarrhea or loose stools. 12/15/23   Theadore Ozell HERO, MD  losartan  (COZAAR ) 100 MG tablet Take 1 tablet by mouth daily. 02/27/21   [provider]  magnesium  oxide (MAG-OX) 400 MG tablet Take 1 tablet (400 mg total) by mouth daily. 03/10/22   Rogers Hai, MD  megestrol  (  MEGACE ) 40 MG/ML suspension Take 10 mLs (400 mg total) by mouth 2 (two) times daily. 02/10/24   Tina Pauletta BROCKS, MD  montelukast (SINGULAIR) 10 MG tablet Take 10 mg by mouth at bedtime.    [provider]  Multiple Vitamins-Minerals (PRESERVISION AREDS PO) Take 1 tablet by mouth daily.    [provider]  potassium chloride  (MICRO-K ) 10 MEQ CR capsule TAKE 4 CAPSULE BY MOUTH DAILY WITH FOOD, OPEN AND SPRINKLE ON FOOD 04/27/23   Rogers Hai, MD  prochlorperazine  (COMPAZINE ) 10 MG tablet Take 1 tablet (10 mg total) by mouth every 6 (six) hours as needed  for nausea or vomiting. 12/14/21   Rogers Hai, MD  tamsulosin  (FLOMAX ) 0.4 MG CAPS capsule Take 1 tablet by mouth daily.    [provider]  TRELEGY ELLIPTA  200-62.5-25 MCG/ACT AEPB Take 1 puff by mouth daily. 10/29/23   [provider]  vitamin C (ASCORBIC ACID) 500 MG tablet Take 500 mg by mouth daily.    [provider]    Allergies: Semaglutide, Lisinopril, and Metoprolol    Review of Systems  Cardiovascular:  Positive for leg swelling. Negative for chest pain.  Musculoskeletal:  Negative for arthralgias.    Updated Vital Signs BP (!) 159/90   Pulse 83   Temp 98 F (36.7 C)   Resp 17   Ht 1.727 m (5' 8)   Wt 104.3 kg   SpO2 95%   BMI 34.97 kg/m   Physical Exam CONSTITUTIONAL: Elderly but overall well-appearing, no acute distress HEAD: Normocephalic/atraumatic EYES: EOMI/PERRL ENMT: Mucous membranes moist NECK: supple no meningeal signs CV: S1/S2 noted, no murmurs/rubs/gallops noted LUNGS: Lungs are clear to auscultation bilaterally, no apparent distress ABDOMEN: soft NEURO: Pt is awake/alert/appropriate, moves all extremitiesx4.  No facial droop.   EXTREMITIES: pulses normal/equal, full ROM Pitting edema noted bilateral lower extremities, right> left Feet are of equal temperature and appear well-perfused.  Minimal erythema to the right lower extremity.  There are no open wounds noted.  No crepitance. He is able to fully range both lower extremities without difficulty.  No deformities SKIN: warm, see photo PSYCH: no abnormalities of mood noted, alert and oriented to situation     (all labs ordered are listed, but only abnormal results are displayed) Labs Reviewed  BASIC METABOLIC PANEL WITH GFR - Abnormal; Notable for the following components:      Result Value   Potassium 5.2 (*)    Glucose, Bld 120 (*)    All other components within normal limits  CBC - Abnormal; Notable for the following components:   RBC 3.28 (*)     Hemoglobin 10.4 (*)    HCT 33.5 (*)    MCV 102.1 (*)    Platelets 134 (*)    All other components within normal limits  PRO BRAIN NATRIURETIC PEPTIDE    EKG: EKG Interpretation Date/Time:  Wednesday April 12 2024 20:53:12 EST Ventricular Rate:  88 PR Interval:  191 QRS Duration:  129 QT Interval:  375 QTC Calculation: 454 R Axis:   -64  Text Interpretation: Sinus rhythm Atrial premature complex RBBB and LAFB Interpretation limited secondary to artifact No significant change since last tracing Confirmed by Midge Golas ([id number]) on 04/12/2024 11:26:59 PM  Radiology: [facility] Chest 2 View Result Date: 04/12/2024 EXAM: 2 VIEW(S) XRAY OF THE CHEST 04/12/2024 08:43:07 PM COMPARISON: 12/14/2023 CLINICAL HISTORY: sob FINDINGS: LINES, TUBES AND DEVICES: Right chest port with tip at cavoatrial junction. LUNGS AND PLEURA: Persistent elevation of left hemidiaphragm.  Stable left basilar linear and heterogeneous opacities. Stable small left pleural effusion. Trace right pleural effusion. No pneumothorax. HEART AND MEDIASTINUM: Unchanged cardiomediastinal silhouette. Atherosclerotic plaque. BONES AND SOFT TISSUES: No acute osseous abnormality. IMPRESSION: 1. Stable small left pleural effusion and trace right pleural effusion. Electronically signed by: Morgane Naveau MD 04/12/2024 08:52 PM EST RP Workstation: [id number]     Procedures   Medications Ordered in the ED  enoxaparin (LOVENOX) injection 100 mg (has no administration in time range)                                    Medical Decision Making Amount and/or Complexity of Data Reviewed Labs: ordered. Radiology: ordered.  Risk Prescription drug management.   This patient presents to the ED for concern of leg swelling, this involves an extensive number of treatment options, and is a complaint that carries with it a high risk of complications and morbidity.  The differential diagnosis includes but is not limited to acute CHF, anasarca,  DVT, cellulitis  Comorbidities that complicate the patient evaluation: Patient's presentation is complicated by their history of prostate cancer  Social Determinants of Health: Patient's poor mobility  increases the complexity of managing their presentation  Additional history obtained: Additional history obtained from family Records reviewed oncology notes reviewed  Lab Tests: I Ordered, and personally interpreted labs.  The pertinent results include: Anemia, mild thrombocytopenia  Imaging Studies ordered: I ordered imaging studies including X-ray chest  I independently visualized and interpreted imaging which showed no acute findings I agree with the radiologist interpretation   Medicines ordered and prescription drug management: I ordered medication including lovenox for presumed DVT Reevaluation of the patient after these medicines showed that the patient    stayed the same  Reevaluation: After the interventions noted above, I reevaluated the patient and found that they have :stayed the same  Complexity of problems addressed: Patient's presentation is most consistent with  acute presentation with potential threat to life or bodily function  Disposition: After consideration of the diagnostic results and the patient's response to treatment,  I feel that the patent would benefit from discharge  .   Patient presents for increased leg swelling the right lower extremity.  He is high risk for VTE due to history of prostate cancer and has limited mobility Unable to get ultrasound at this time.  Patient prefers to go home.  Will be given one-time dose of Lovenox and will follow-up first thing in the morning for DVT study. No convincing signs of cellulitis or deep space infection. No signs of CHF.  Patient is overall well-appearing and prefers to be discharged No absolute contraindication to anticoagulation have been identified     Final diagnoses:  Lower extremity edema     ED Discharge Orders          Ordered    LE VENOUS        04/13/24 TIANA Midge Golas, MD 04/13/24 0025

## 2024-04-13 NOTE — Patient Instructions (Signed)
-  Start Eliquis  10 mg (2 tablets) twice daily for 7 days then take 5 mg (1 tablet) twice daily.  -Talk with Dr. Tina at your next visit about refills. Refills will likely be cheaper coming through the [place].  -It is important to take your medication around the same time every day.  -Avoid NSAIDs like ibuprofen  (Advil , Motrin ) and naproxen (Aleve) as well as aspirin doses over 100 mg daily. -Tylenol  (acetaminophen ) is the preferred over the counter pain medication to lower the risk of bleeding. -Be sure to alert all of your health care providers that you are taking an anticoagulant prior to starting a new medication or having a procedure. -Monitor for signs and symptoms of bleeding (abnormal bruising, prolonged bleeding, nose bleeds, bleeding from gums, discolored urine, black tarry stools). If you have fallen and hit your head OR if your bleeding is severe or not stopping, seek emergency care.  -Go to the emergency room if emergent signs and symptoms of new clot occur (new or worse swelling and pain in an arm or leg, shortness of breath, chest pain, fast or irregular heartbeats, lightheadedness, dizziness, fainting, coughing up blood) or if you experience a significant color change (pale or blue) in the extremity that has the DVT.  -We recommend you wear compression stockings (20-30 mmHg) as long as you are having swelling or pain. Be sure to purchase the correct size and take them off at night. Elevate your legs daily to help with swelling.   If you have any questions or need to reschedule an appointment, please call [phone]. If you are having an emergency, call 911 or present to the nearest emergency room.   What is a DVT?  -Deep vein thrombosis (DVT) is a condition in which a blood clot forms in a vein of the deep venous system which can occur in the lower leg, thigh, pelvis, arm, or neck. This condition is serious and can be life-threatening if the clot travels to the arteries of the lungs and  causing a blockage (pulmonary embolism, PE). A DVT can also damage veins in the leg, which can lead to long-term venous disease, leg pain, swelling, discoloration, and ulcers or sores (post-thrombotic syndrome).  -Treatment may include taking an anticoagulant medication to prevent more clots from forming and the current clot from growing, wearing compression stockings, and/or surgical procedures to remove or dissolve the clot.

## 2024-04-13 NOTE — Telephone Encounter (Signed)
 Patient Advocate Encounter  Test billing for this patient's current coverage ([employer]) returns a $47 copay for 30 day supply and $141 for 90 day supply of Eliquis .  This test claim was processed through [facility]- copay amounts may vary at other pharmacies due to pharmacy/plan contracts, or as the patient moves through the different stages of their insurance plan.  Rachel DEL, CPhT Rx Patient Advocate Phone: [phone]

## 2024-04-14 ENCOUNTER — Telehealth: Payer: Self-pay

## 2024-04-14 ENCOUNTER — Other Ambulatory Visit (HOSPITAL_COMMUNITY): Payer: Self-pay

## 2024-04-14 ENCOUNTER — Inpatient Hospital Stay

## 2024-04-14 ENCOUNTER — Inpatient Hospital Stay (HOSPITAL_BASED_OUTPATIENT_CLINIC_OR_DEPARTMENT_OTHER)

## 2024-04-14 VITALS — BP 125/66 | HR 76 | Temp 97.6°F | Resp 18 | Wt 230.7 lb

## 2024-04-14 DIAGNOSIS — E119 Type 2 diabetes mellitus without complications: Secondary | ICD-10-CM | POA: Diagnosis not present

## 2024-04-14 DIAGNOSIS — Z7189 Other specified counseling: Secondary | ICD-10-CM

## 2024-04-14 DIAGNOSIS — C7951 Secondary malignant neoplasm of bone: Secondary | ICD-10-CM | POA: Diagnosis present

## 2024-04-14 DIAGNOSIS — C771 Secondary and unspecified malignant neoplasm of intrathoracic lymph nodes: Secondary | ICD-10-CM

## 2024-04-14 DIAGNOSIS — Z7982 Long term (current) use of aspirin: Secondary | ICD-10-CM | POA: Diagnosis not present

## 2024-04-14 DIAGNOSIS — C61 Malignant neoplasm of prostate: Secondary | ICD-10-CM

## 2024-04-14 DIAGNOSIS — Z7952 Long term (current) use of systemic steroids: Secondary | ICD-10-CM | POA: Diagnosis not present

## 2024-04-14 DIAGNOSIS — I82411 Acute embolism and thrombosis of right femoral vein: Secondary | ICD-10-CM | POA: Diagnosis not present

## 2024-04-14 DIAGNOSIS — Z8042 Family history of malignant neoplasm of prostate: Secondary | ICD-10-CM | POA: Diagnosis not present

## 2024-04-14 DIAGNOSIS — Z87891 Personal history of nicotine dependence: Secondary | ICD-10-CM | POA: Diagnosis not present

## 2024-04-14 DIAGNOSIS — Z79899 Other long term (current) drug therapy: Secondary | ICD-10-CM | POA: Diagnosis not present

## 2024-04-14 DIAGNOSIS — R970 Elevated carcinoembryonic antigen [CEA]: Secondary | ICD-10-CM | POA: Diagnosis not present

## 2024-04-14 DIAGNOSIS — I10 Essential (primary) hypertension: Secondary | ICD-10-CM | POA: Diagnosis not present

## 2024-04-14 DIAGNOSIS — C787 Secondary malignant neoplasm of liver and intrahepatic bile duct: Secondary | ICD-10-CM | POA: Diagnosis not present

## 2024-04-14 DIAGNOSIS — D696 Thrombocytopenia, unspecified: Secondary | ICD-10-CM

## 2024-04-14 DIAGNOSIS — J449 Chronic obstructive pulmonary disease, unspecified: Secondary | ICD-10-CM | POA: Diagnosis not present

## 2024-04-14 DIAGNOSIS — C770 Secondary and unspecified malignant neoplasm of lymph nodes of head, face and neck: Secondary | ICD-10-CM | POA: Diagnosis not present

## 2024-04-14 LAB — CBC WITH DIFFERENTIAL (CANCER CENTER ONLY)
Abs Immature Granulocytes: 0.05 K/uL (ref 0.00–0.07)
Basophils Absolute: 0 K/uL (ref 0.0–0.1)
Basophils Relative: 0 %
Eosinophils Absolute: 0 K/uL (ref 0.0–0.5)
Eosinophils Relative: 0 %
HCT: 30.3 % — ABNORMAL LOW (ref 39.0–52.0)
Hemoglobin: 9.7 g/dL — ABNORMAL LOW (ref 13.0–17.0)
Immature Granulocytes: 1 %
Lymphocytes Relative: 8 %
Lymphs Abs: 0.6 K/uL — ABNORMAL LOW (ref 0.7–4.0)
MCH: 31 pg (ref 26.0–34.0)
MCHC: 32 g/dL (ref 30.0–36.0)
MCV: 96.8 fL (ref 80.0–100.0)
Monocytes Absolute: 0.9 K/uL (ref 0.1–1.0)
Monocytes Relative: 12 %
Neutro Abs: 6 K/uL (ref 1.7–7.7)
Neutrophils Relative %: 79 %
Platelet Count: 132 K/uL — ABNORMAL LOW (ref 150–400)
RBC: 3.13 MIL/uL — ABNORMAL LOW (ref 4.22–5.81)
RDW: 14.6 % (ref 11.5–15.5)
WBC Count: 7.6 K/uL (ref 4.0–10.5)
nRBC: 0.3 % — ABNORMAL HIGH (ref 0.0–0.2)

## 2024-04-14 LAB — CMP (CANCER CENTER ONLY)
ALT: 14 U/L (ref 0–44)
AST: 19 U/L (ref 15–41)
Albumin: 3.7 g/dL (ref 3.5–5.0)
Alkaline Phosphatase: 48 U/L (ref 38–126)
Anion gap: 7 (ref 5–15)
BUN: 14 mg/dL (ref 8–23)
CO2: 31 mmol/L (ref 22–32)
Calcium: 8.8 mg/dL — ABNORMAL LOW (ref 8.9–10.3)
Chloride: 105 mmol/L (ref 98–111)
Creatinine: 0.94 mg/dL (ref 0.61–1.24)
GFR, Estimated: >60 mL/min (ref >60)
Glucose, Bld: 103 mg/dL — ABNORMAL HIGH (ref 70–99)
Potassium: 4.5 mmol/L (ref 3.5–5.1)
Sodium: 143 mmol/L (ref 135–145)
Total Bilirubin: 0.4 mg/dL (ref 0.0–1.2)
Total Protein: 6.6 g/dL (ref 6.5–8.1)

## 2024-04-14 LAB — PSA: Prostatic Specific Antigen: 329.8 ng/mL — ABNORMAL HIGH (ref 0.00–4.00)

## 2024-04-14 LAB — LACTATE DEHYDROGENASE: LDH: 292 U/L — ABNORMAL HIGH (ref 105–235)

## 2024-04-14 MED ORDER — OLAPARIB 150 MG PO TABS: 300.0000 mg | ORAL_TABLET | Freq: Two times a day (BID) | ORAL | 11 refills | Status: AC

## 2024-04-14 NOTE — Telephone Encounter (Signed)
 Scheduled a follow-up for today at 2pm. Called and spoke with the patients daughter, she said they will be here at 2:30.

## 2024-04-14 NOTE — Assessment & Plan Note (Addendum)
 Discussed again today.  He has exhausted treatment options typically for prostate cancer as detailed in history.  This may be his last option.  Continue palliative care follow-up and reassessment.

## 2024-04-14 NOTE — Telephone Encounter (Signed)
 Oral Oncology Patient Advocate Encounter   Received notification that prior authorization for olaparib  (LYNPARZA ) 150 MG tablet  is required.   PA submitted on 04/14/24 Key [id number] Status is pending     Lucie Lamer, CPhT [place]  [facility] Specialty Pharmacy Services Oncology Pharmacy Patient Advocate Specialist [facility] [place] Phone: [phone]  Fax: [phone] [email or web address]

## 2024-04-14 NOTE — Assessment & Plan Note (Addendum)
 Apixaban  5 mg twice daily. Bleeding precautions

## 2024-04-14 NOTE — Assessment & Plan Note (Addendum)
 Discussed Olaparib  300 mg twice daily. Patient will keep appointment in December with labs scheduled. Okay infusion appointment for now.

## 2024-04-14 NOTE — Telephone Encounter (Signed)
 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  Labs from 04/14/24 (CBC and CMP) assessed, no interventions needed.   Current medication list in Epic reviewed, no significant/ relevant DDIs with Lynparza  identified.  Evaluated chart and no patient barriers to medication adherence noted.   Patient agreement for treatment documented in MD note on 04/14/2024.  Prescription has been e-scribed to the [facility] for benefits analysis and approval.  Oral Oncology Clinic will continue to follow for insurance authorization, copayment issues, initial counseling and start date.  Ivonne [place]burg, PharmD Hematology/Oncology Clinical Pharmacist [place] [facility] [phone] 04/14/2024 3:54 PM

## 2024-04-14 NOTE — Telephone Encounter (Addendum)
 Oral Oncology Patient Advocate Encounter  Prior Authorization for olaparib  (LYNPARZA ) 150 MG tablet  has been approved.    PA# [id number] Effective dates: 05/26/23 through 05/24/25  Patients co-pay is $1,541.24.  Looking for funding to help with cost, no grants open, will try to apply for Patient Assistance through [employer] so he can get it for free from company.   Lucie Lamer, CPhT [place]  [facility] Specialty Pharmacy Services Oncology Pharmacy Patient Advocate Specialist [facility] [place] Phone: [phone]  Fax: [phone] [email or web address]

## 2024-04-14 NOTE — Progress Notes (Signed)
 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   Current diagnosis: mCRPC Previous treatment as below: Germline testing negative. VUS in PMS2.  Somatic testing: in 2021. MS-stable.  AR amplification. Somatic testing 2025: CHEK2.  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 Docetaxel  for 14 cycles discontinued as his PSA plateaued around 11 07/13/2016 - 04/09/2019 Abiraterone  and prednisone  discontinued secondary to PSA progression. 04/12/2019 - 11/03/2019 Enzalutamide  from with progression. 11/10/2019 CT CAP shows no findings of active malignancy.  Stable small sclerotic lesions at T4 and T10, nonspecific.  Right hydronephrosis with right proximal hydroureter extending down to a 0.8 x 0.8 x 0.4 cm proximal ureteral stone at L3 vertical level.  Nonobstructive right and possibly left nephrolithiasis.   11/10/2019 Bone scan shows right proximal tibial metaphyseal activity from recent trauma.  Degenerative findings in the spine, right sternoclavicular joint, shoulders and right foot.  No evidence of metastatic disease.   Foundation 1 test shows MS-stable.  AR amplification.  Sensitivity is reduced due to sample quality. 12/12/2019 Guardant 360 MSI high not detected.  TMB was not evaluable.  No other targetable mutations.   05/15/20 PET scan showed Nodal metastases. Intense radiotracer activity in the left supraclavicular and prevascular lymph nodes, measuring 10 mm.  Cluster of small prevascular lymph nodes measures 5-7 mm each with SUV 54.  AP window  lymph node measuring 8 mm.  No activity in the prostate gland or abdominal or pelvic lymph nodes.  No skeletal activity.   08/27/2020 - 09/06/2020 SBRT to the left supraclavicular lymph node and prevascular mediastinal lymph node from, 40 Gray in 5 fractions.   10/22/2021 CT CAP: Done at [facility]: No lymphadenopathy.  Cirrhosis.  Intrahepatic and extrahepatic biliary ductal dilatation. 10/28/2021 Bone scan: New focal abnormal tracer uptake at the inferior aspect of the right scapula.   11/13/2021 PSMA PET scan: Interval development of multifocal tracer avid bone metastasis, only 1 of these lesions is visible on the recent nuclear medicine bone scan dated 10/28/2021.  New tracer avid right axillary, subcarinal, left paratracheal, left retrocrural and upper abdominal lymph nodes.   12/15/2021 - 09/08/2022 13 cycles of cabazitaxel  with progression   09/17/22 PSMA PET. Progression of prostate cancer nodal metastasis. Mild progression of skeletal metastasis.  New radiotracer avid RIGHT level III cervical lymph nodes, RIGHT supraclavicular lymph nodes, RIGHT axillary lymph node, and RIGHT retrocrural lymph nodes. Two new radiotracer avid skeletal metastasis in the RIGHT pelvis. Stable additional multifocal skeletal metastasis.   11/16/2022 - 06/15/2023 Pluvicto  x 6. PSA nadir at 10.82 in Oct 2024.   10/29/2023 Cycle 1 of Xofigo  discontinued secondary to new hepatic metastasis on PSMA PET scan on 11/04/2023. New nodal metastasis above and below the diaphragm and progression of skeletal metastasis. 11/18/23 PSA 574. - Cycle 1 of docetaxel  and carboplatin  on 11/18/2023 - Cycle 2 of docetaxel  and carboplatin  on 12/09/2023 - Cycle 3 of docetaxel  and carboplatin  on 12/30/2023 - Cycle 4 postpone due to thrombocytopenia to 02/10/2024. - Cycle 5 postpone due to thrombocytopenia to 03/23/2024 rising PSA   Presented to ED with new DVT.  Anticoagulation with apixaban .  Previously on chemotherapy  has profound  thrombocytopenia.  This could be concerning if continuing on chemotherapy.  Discussed trial of Olaparib .  Given HRR mutation could potentially have some effect or stabilizing illness.  Unfortunately, he has exhausted treatment.  There is nothing else to this.  We did cussed this likely will be his last treatment.  If he cannot tolerate, or continue to have progression, hospice is reasonable.  He has already seen palliative care team.  We will need to follow closely on anemia and blood counts and how he tolerates if he starts this medication. 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 Patient will keep appointment in December with labs scheduled. Okay infusion appointment for now. Acute deep vein thrombosis (DVT) of femoral vein of right lower extremity (HCC) Apixaban  5 mg twice daily. Bleeding precautions Goals of care, counseling/discussion Discussed again today.  He has exhausted treatment options typically for prostate cancer as detailed in history.  This may be his last option.  Continue palliative care follow-up and reassessment.  Orders Placed This Encounter  Procedures   CBC with Differential (Cancer Center Only)    Standing Status:   Future    Number of Occurrences:   1    Expiration Date:   04/14/2025   CMP (Cancer Center only)    Standing Status:   Future    Number of Occurrences:   1    Expiration Date:   04/14/2025   Lactate dehydrogenase    Standing Status:   Future    Number of Occurrences:   1    Expiration Date:   04/14/2025   PSA    Standing Status:   Future    Number of Occurrences:   1    Expiration Date:   04/14/2025   CEA (Access)    Standing Status:   Future    Number of Occurrences:   1    Expiration Date:   04/14/2025     Andre JAYSON Chihuahua, MD  INTERVAL HISTORY: Patient returns for follow-up.  Overall feeling better.  Leg swelling improved after starting anticoagulation.  No  bleeding.  He denies any pain.  Some fatigue after last chemo but rebounded pretty quickly.  No new bone pain or back pain.  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  The Common Hereditary Gene Panel offered by [facility] includes sequencing and/or deletion duplication testing of the following 48 genes: APC, ATM, AXIN2, BARD1, BMPR1A, BRCA1, BRCA2, BRIP1, CDH1, CDK4, CDKN2A (p14ARF), CDKN2A (p16INK4a), CHEK2, CTNNA1, DICER1, EPCAM (Deletion/duplication testing only), GREM1 (promoter region deletion/duplication testing only), KIT, MEN1, MLH1, MSH2, MSH3, MSH6, MUTYH, NBN, NF1, NHTL1, PALB2, PDGFRA, PMS2, POLD1, POLE, PTEN, RAD50, RAD51C, RAD51D, RNF43, SDHB, SDHC, SDHD, SMAD4, SMARCA4. STK11, TP53, TSC1, TSC2, and VHL.  The following genes were evaluated for sequence changes only: SDHA and HOXB13 c.251G>A variant only. The report date is Oct 14, 2018.   09/18/2019 Genetic Testing   Foundation One CDx  12/12/2019 Genetic Testing   Guardant 360       12/15/2021 - 01/06/2022 Chemotherapy   Patient is on Treatment Plan : PROSTATE Cabazitaxel  + Prednisone  q21d     12/15/2021 - 09/08/2022 Chemotherapy   Patient is on Treatment Plan : PROSTATE Cabazitaxel  (20) D1 + Prednisone  D1-21 q21d     11/18/2023 - 03/23/2024 Chemotherapy   Patient is on Treatment Plan : PROSTATE Carboplatin  (AUC 4) + Docetaxel  (60) q21d     02/29/2024 Cancer Staging   Staging form: Prostate, AJCC 8th  Edition - Clinical: Stage IVB (cTX, cN1, pM1c) - Signed by Tina Andre BROCKS, MD on 02/29/2024      PHYSICAL EXAMINATION: ECOG PERFORMANCE STATUS: 3  Vitals:   04/14/24 1443  BP: 125/66  Pulse: 76  Resp: 18  Temp: 97.6 F (36.4 C)  SpO2: 99%   Filed Weights   04/14/24 1443  Weight: 230 lb 11.2 oz (104.6 kg)    GENERAL: alert, no distress and comfortable LUNGS: clear to auscultation and no wheeze or rales with normal breathing effort HEART: regular rate & rhythm  Musculoskeletal: Bilateral lower extremity edema right greater than left  Relevant data reviewed during this visit included labs.  New labs ordered.

## 2024-04-17 ENCOUNTER — Other Ambulatory Visit (HOSPITAL_COMMUNITY): Payer: Self-pay

## 2024-04-17 LAB — CEA (ACCESS): CEA (CHCC): 3.82 ng/mL (ref 0.00–5.00)

## 2024-04-17 NOTE — Telephone Encounter (Signed)
 Oncology Pharmacist Encounter   I spoke to patients daughter, Corean, who attended the previous visit with Dr. Tina. Notified daughter that the Lynparza  is $1,500 for the month supply and that we will move forward to patient assistance. She asked that we send the document through docusign to her email either later today or tomorrow morning. Patients daughter also agreed to being placed on a grant waitlist. We went over all details of the grants and how they work. Patients daughter agreed and appreciated the call with the information.   Jamin Humphries, PharmD Hematology/Oncology Clinical Pharmacist [facility] [phone]

## 2024-04-21 ENCOUNTER — Telehealth: Payer: Self-pay

## 2024-04-21 NOTE — Telephone Encounter (Signed)
 Oral Oncology Patient Advocate Encounter   Began application for assistance for Lynparza  through [place].   Application submitted after completion of necessary supporting documentation.   [employer] phone number [phone].   I will continue to check the status until final determination.   Lucie Lamer, CPhT   [facility] Special[facility] Pharm[facility] Oncology Pharmacy Patient Advocate Specialist [facility] [place] Phone: [phone]  Fax: [phone] [email or web address]

## 2024-04-25 NOTE — Telephone Encounter (Signed)
 Oral Oncology Patient Advocate Encounter  Called [employer], they are behind on faxes, and they are currently going through faxes that were submitted on 11/28, so hopefully we will hear something soon.  Lucie Lamer, CPhT [place]  [facility] Special[facility] Pharmacy Services Oncology Pharmacy Patient Advocate Specialist [facility] [place] Phone: [phone]  Fax: [phone] [email or web address]

## 2024-04-26 NOTE — Telephone Encounter (Signed)
 Oral Oncology Patient Advocate Encounter  Received fax that patient has been conditionally approved pending proof of 3 fund closures. I have faxed that into to them this morning.  Lucie Lamer, CPhT [place]  [facility] Special[facility] Pharm[facility] Services Oncology Pharmacy Patient Advocate Specialist [facility] [place] Phone: [phone]  Fax: [phone] [email or web address]

## 2024-04-27 NOTE — Telephone Encounter (Signed)
 Oral Oncology Patient Advocate Encounter   Received notification that the application for assistance for Lynparza  through [employer] has been approved.   [employer] phone number [phone].    Effective dates: 04/27/24 through 05/24/25  Medication will be filled at [facility].  I have spoken to the patient.  Lucie Lamer, CPhT [place]  [facility] Specialty Pharmacy Services Oncology Pharmacy Patient Advocate Specialist [facility] [place] Phone: [phone]  Fax: [phone] [email or web address]

## 2024-05-01 NOTE — Telephone Encounter (Signed)
 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 Planned duration until disease progression or unacceptable toxicity.  Treatment goal: Palliative  Counseled patient on administration, dosing, side effects, monitoring, drug-food interactions, safe handling, storage, and disposal.  Patient will take Lynparza  150mg  tablets, 2 tablets (300mg ) by mouth 2 times daily without regard to food. Patient counseled that administering with food may increase tolerability.  Patient knows to avoid grapefruit or grapefruit juice and seville oranges while on therapy with Lynparza .  Lynparza  start date: 05/02/24   Adverse effects include but are not limited to: nausea, vomiting, diarrhea, taste changes, mouth sores, fatigue, constipation, decreased blood counts, and joint pain. Patient informed that pneumonitis (including some fatalities) has occurred rarely. Myelodysplastic syndrome/acute myeloid leukemia (MDS/AML) have been reported (rarely) in clinical trials.  Patient has anti-emetic on hand and knows to take it if nausea develops.   Patient will obtain anti diarrheal and alert the office of 4 or more loose stools above baseline.  Reviewed with patient importance of keeping a medication schedule and plan for any missed doses. No barriers to medication adherence identified.  Medication reconciliation performed and medication/allergy list updated.  Distress thermometer not completed during telephone call as patient has been on previous lines of therapy.   Communication and Learning Assessment Primary learner: Patients daughter Barriers to learning: No barriers Preferred language: English Learning preferences: Listening Reading  All questions answered. Patient and patients daughter voiced understanding and  appreciation.   Medication education handout placed in mail for patient. Patient knows to call the office with questions or concerns. Oral Chemotherapy Clinic phone number provided to patient.   Hannahgrace Lalli, PharmD Hematology/Oncology Clinical Pharmacist [facility] Oral Chemotherapy Navigation Clinic [phone] 05/01/2024   12:11 PM

## 2024-05-02 ENCOUNTER — Telehealth: Payer: Self-pay

## 2024-05-02 NOTE — Progress Notes (Deleted)
 [facility] OFFICE PROGRESS NOTE  Patient Care Team: Clinic, Bonni Lien as PCP - General Patrcia Cough, MD as Consulting Physician (Radiation Oncology) Andre Pont, RN as Oncology Nurse Navigator Pickenpack-Cousar, Andre SAILOR, NP as Nurse Practitioner [facility] and Palliative Medicine)  Assessment & Plan   No orders of the defined types were placed in this encounter.    Andre [name] [place], MD  INTERVAL HISTORY: Patient returns for follow-up.  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  The Common Hereditary Gene Panel offered by [facility] includes sequencing and/or deletion duplication testing of the following 48 genes: APC, ATM, AXIN2, BARD1, BMPR1A, BRCA1, BRCA2, BRIP1, CDH1, CDK4, CDKN2A (p14ARF), CDKN2A (p16INK4a), CHEK2, CTNNA1, DICER1, EPCAM (Deletion/duplication testing only), GREM1 (promoter region deletion/duplication testing only), KIT, MEN1, MLH1, MSH2, MSH3, MSH6, MUTYH, NBN, NF1, NHTL1, PALB2, PDGFRA, PMS2, POLD1, POLE, PTEN, RAD50, RAD51C, RAD51D, RNF43, SDHB, SDHC, SDHD, SMAD4, SMARCA4. STK11, TP53, TSC1, TSC2, and VHL.  The following genes were evaluated for sequence changes only: SDHA and HOXB13 c.251G>A variant only. The report date is Oct 14, 2018.   09/18/2019 Genetic Testing   [facility] CDx      12/12/2019 Genetic Testing   Guardant 360       12/15/2021 - 01/06/2022 Chemotherapy   Patient is on Treatment Plan :  PROSTATE Cabazitaxel  + Prednisone  q21d     12/15/2021 - 09/08/2022 Chemotherapy   Patient is on Treatment Plan : PROSTATE Cabazitaxel  (20) D1 + Prednisone  D1-21 q21d     11/18/2023 - 03/23/2024 Chemotherapy   Patient is on Treatment Plan : PROSTATE Carboplatin  (AUC 4) + Docetaxel  (60) q21d     02/29/2024 Cancer Staging   Staging form: Prostate, AJCC 8th Edition - Clinical: Stage IVB (cTX, cN1, pM1c) - Signed by Andre Andre JAYSON, MD on 02/29/2024      PHYSICAL EXAMINATION: ECOG PERFORMANCE STATUS: {CHL ONC ECOG ED:[id number]}  There were no vitals filed for this visit. There were no vitals filed for this visit.  GENERAL: alert, no distress and comfortable SKIN: skin color normal and no jaundice or bruising or petechiae on exposed skin EYES: normal, sclera clear OROPHARYNX: no exudate  NECK: No palpable mass LYMPH:  no palpable cervical, axillary lymphadenopathy  LUNGS: clear to auscultation and no wheeze or rales with normal breathing effort HEART: regular rate & rhythm  ABDOMEN: abdomen soft, non-tender and nondistended. Musculoskeletal: no edema NEURO: no focal motor/sensory deficits  Relevant data reviewed during this visit included labs.  New labs ordered.

## 2024-05-03 ENCOUNTER — Other Ambulatory Visit: Payer: Self-pay

## 2024-05-03 NOTE — Telephone Encounter (Signed)
 Oral Oncology Patient Advocate Encounter  Shipment was delivered Tuesday evening at 4:54pm  Lucie Lamer, CPhT [place]  [facility] Specialty Pharmacy Services Oncology Pharmacy Patient Advocate Specialist [facility] [place] Phone: [phone]  Fax: [phone] [email or web address]

## 2024-05-04 ENCOUNTER — Inpatient Hospital Stay: Admitting: Dietician

## 2024-05-04 ENCOUNTER — Telehealth: Payer: Self-pay | Admitting: *Deleted

## 2024-05-04 ENCOUNTER — Inpatient Hospital Stay

## 2024-05-04 ENCOUNTER — Inpatient Hospital Stay: Admitting: Nurse Practitioner

## 2024-05-04 ENCOUNTER — Other Ambulatory Visit: Payer: Self-pay

## 2024-05-04 NOTE — Progress Notes (Signed)
 Patient elected to switch care to [facility] facility.  Treatment plan canceled.

## 2024-05-04 NOTE — Telephone Encounter (Signed)
 Called daughter regarding today's appointments. She states he has changed physicians to [facility]. Daughter thought that Dr Tina had been notified. She says the pharmacist at [facility] is coordinating his oral chemo.  Appts cancelled.

## 2024-05-12 NOTE — Telephone Encounter (Signed)
 [facility] Telephone Triage Encounter Received the following message to [facility] Triage Nurse Line via  [x]  LIVE phone call             []  Recorded VM [x]  Confirmed patient's name and DOB [x]  Confirmed patient is physically located in a nursing compact state during this call (excludes [place], [place], [place], [place], [place], [place], [place], [place], [place], [place], [place])  []  Patient is not physically located in a nursing compact state during time of call. Call documented only, no recommendations can be given.     83 yr old male with metastatic prostate cancer, last seen 04/26/24.  Received call from daughter Corean returning missed call to [employer].    Corean can be reached at [phone], requesting call back.  Team notified.

## 2024-05-22 ENCOUNTER — Encounter: Payer: Self-pay | Admitting: *Deleted

## 2024-06-09 ENCOUNTER — Telehealth: Payer: Self-pay | Admitting: Student-PharmD

## 2024-06-09 DIAGNOSIS — I82411 Acute embolism and thrombosis of right femoral vein: Secondary | ICD-10-CM

## 2024-06-09 MED ORDER — APIXABAN 5 MG PO TABS: 5.0000 mg | ORAL_TABLET | Freq: Two times a day (BID) | ORAL | 1 refills | Status: AC

## 2024-06-09 NOTE — Telephone Encounter (Signed)
 Patient's daughter called requesting refills of Eliquis  to be sent to the [facility] pharmacy as well as our office notes faxed to the patient's [facility] doctor, Dr. Louann Righter. At last visit patient's daughter told us  that they would get refills from his [place] doctor. Now says they need this information from us  to fill it. I have faxed the notes to the number provided [phone]) and sent 6 months of Eliquis  refills to the preferred pharmacy, but further refills will need to come from the [place] as we are no longer following the patient for this.
